# Patient Record
Sex: Male | Born: 1935
Health system: Southern US, Community
[De-identification: ages and names within clinical notes are randomized; demographics above are authoritative.]

## PROBLEM LIST (undated history)

## (undated) DIAGNOSIS — Z8719 Personal history of other diseases of the digestive system: Secondary | ICD-10-CM

## (undated) DIAGNOSIS — H9190 Unspecified hearing loss, unspecified ear: Secondary | ICD-10-CM

## (undated) DIAGNOSIS — G629 Polyneuropathy, unspecified: Secondary | ICD-10-CM

## (undated) DIAGNOSIS — Z87442 Personal history of urinary calculi: Secondary | ICD-10-CM

## (undated) DIAGNOSIS — L719 Rosacea, unspecified: Secondary | ICD-10-CM

## (undated) DIAGNOSIS — Z9889 Other specified postprocedural states: Secondary | ICD-10-CM

## (undated) DIAGNOSIS — R011 Cardiac murmur, unspecified: Secondary | ICD-10-CM

## (undated) DIAGNOSIS — R112 Nausea with vomiting, unspecified: Secondary | ICD-10-CM

## (undated) DIAGNOSIS — C801 Malignant (primary) neoplasm, unspecified: Secondary | ICD-10-CM

## (undated) DIAGNOSIS — K5792 Diverticulitis of intestine, part unspecified, without perforation or abscess without bleeding: Secondary | ICD-10-CM

## (undated) DIAGNOSIS — C8590 Non-Hodgkin lymphoma, unspecified, unspecified site: Secondary | ICD-10-CM

## (undated) DIAGNOSIS — K219 Gastro-esophageal reflux disease without esophagitis: Secondary | ICD-10-CM

## (undated) DIAGNOSIS — Z973 Presence of spectacles and contact lenses: Secondary | ICD-10-CM

## (undated) HISTORY — PX: HEMORROIDECTOMY: SUR656

## (undated) HISTORY — PX: APPENDECTOMY: SHX54

## (undated) HISTORY — DX: Non-Hodgkin lymphoma, unspecified, unspecified site: C85.90

## (undated) HISTORY — PX: COLONOSCOPY: SHX174

## (undated) HISTORY — DX: Cardiac murmur, unspecified: R01.1

## (undated) HISTORY — PX: TONSILLECTOMY: SUR1361

## (undated) HISTORY — DX: Polyneuropathy, unspecified: G62.9

---

## 2013-04-28 ENCOUNTER — Ambulatory Visit (INDEPENDENT_AMBULATORY_CARE_PROVIDER_SITE_OTHER): Payer: Medicare Other | Admitting: Surgery

## 2013-05-08 ENCOUNTER — Encounter (INDEPENDENT_AMBULATORY_CARE_PROVIDER_SITE_OTHER): Payer: Self-pay | Admitting: Surgery

## 2013-05-08 ENCOUNTER — Ambulatory Visit (INDEPENDENT_AMBULATORY_CARE_PROVIDER_SITE_OTHER): Payer: Medicare Other | Admitting: Surgery

## 2013-05-08 VITALS — BP 118/64 | HR 88 | Temp 97.6°F | Resp 14 | Ht 68.0 in | Wt 203.4 lb

## 2013-05-08 DIAGNOSIS — K4090 Unilateral inguinal hernia, without obstruction or gangrene, not specified as recurrent: Secondary | ICD-10-CM

## 2013-05-08 NOTE — Progress Notes (Signed)
Patient ID: Seth Gilbert, male   DOB: 1936/05/09, 77 y.o.   MRN: [id number]  Chief Complaint  Patient presents with  . New Evaluation    eval LIH    HPI Seth Gilbert is a 77 y.o. male.  Patient is sent at the request of Dr. Tenny Craw for left inguinal hernia. He was picked up on his recent physical. Noticed one year ago some mild left groin swelling after physical exertion. No nausea or vomiting. No significant left groin pain. Occasional constipation. HPI  Past Medical History  Diagnosis Date  . Heart murmur     Past Surgical History  Procedure Laterality Date  . Appendectomy    . Tonsillectomy      Family History  Problem Relation Age of Onset  . COPD Mother   . Heart disease Father     Social History History  Substance Use Topics  . Smoking status: Never Smoker   . Smokeless tobacco: Never Used  . Alcohol Use: Yes     Comment: occasional    Not on File  Current Outpatient Prescriptions  Medication Sig Dispense Refill  . ampicillin (PRINCIPEN) 500 MG capsule Take 500 mg by mouth daily.       . famotidine (PEPCID) 40 MG tablet Take 40 mg by mouth daily.      . Glucosamine-Chondroit-Vit C-Mn (GLUCOSAMINE 1500 COMPLEX) CAPS Take by mouth.      . Multiple Vitamins-Minerals (CENTRUM SILVER PO) Take by mouth.      . vitamin E 400 UNIT capsule Take 400 Units by mouth daily.       No current facility-administered medications for this visit.    Review of Systems Review of Systems  Constitutional: Negative for fever, chills and unexpected weight change.  HENT: Negative for hearing loss, congestion, sore throat, trouble swallowing and voice change.   Eyes: Negative for visual disturbance.  Respiratory: Negative for cough and wheezing.   Cardiovascular: Negative for chest pain, palpitations and leg swelling.  Gastrointestinal: Negative for nausea, vomiting, abdominal pain, diarrhea, constipation, blood in stool, abdominal distention, anal bleeding and rectal pain.    Genitourinary: Negative for hematuria and difficulty urinating.  Musculoskeletal: Negative for arthralgias.  Skin: Negative for rash and wound.  Neurological: Negative for seizures, syncope, weakness and headaches.  Hematological: Negative for adenopathy. Does not bruise/bleed easily.  Psychiatric/Behavioral: Negative for confusion.    Blood pressure 118/64, pulse 88, temperature 97.6 F (36.4 C), temperature source Temporal, resp. rate 14, height 5\' 8"  (1.727 m), weight 203 lb 6.4 oz (92.262 kg).  Physical Exam Physical Exam  Constitutional: He is oriented to person, place, and time. He appears well-developed and well-nourished.  HENT:  Head: Normocephalic and atraumatic.  Eyes: EOM are normal. Pupils are equal, round, and reactive to light.  Neck: Normal range of motion. Neck supple.  Cardiovascular: Normal rate and regular rhythm.   Pulmonary/Chest: Effort normal.  Abdominal: Soft. Bowel sounds are normal.    Musculoskeletal: Normal range of motion.  Neurological: He is alert and oriented to person, place, and time.  Skin: Skin is warm and dry.  Psychiatric: He has a normal mood and affect. His behavior is normal. Judgment and thought content normal.    Data Reviewed Dr Tenny Craw notes  Assessment    Left inguinal hernia minimally symptomatic    Plan    The patient would like to have this repaired next year. Return to clinic next April for surgery in May as long as he remains asymptomatic. Information  given about anal hernias and signs and symptoms to look for to incarceration and strangulation. Will call if symptoms worsen       Kaneshia Cater A. 05/08/2013, 12:07 PM

## 2013-05-08 NOTE — Patient Instructions (Signed)

## 2013-12-18 ENCOUNTER — Encounter (INDEPENDENT_AMBULATORY_CARE_PROVIDER_SITE_OTHER): Payer: Self-pay | Admitting: Surgery

## 2013-12-18 ENCOUNTER — Ambulatory Visit (INDEPENDENT_AMBULATORY_CARE_PROVIDER_SITE_OTHER): Payer: Medicare Other | Admitting: Surgery

## 2013-12-18 VITALS — BP 126/74 | HR 72 | Temp 97.5°F | Resp 14 | Ht 68.0 in | Wt 203.8 lb

## 2013-12-18 DIAGNOSIS — K4090 Unilateral inguinal hernia, without obstruction or gangrene, not specified as recurrent: Secondary | ICD-10-CM

## 2013-12-18 NOTE — Patient Instructions (Signed)
Open Hernia Repair Open hernia repair is surgery to fix a hernia. A hernia occurs when an internal organ or tissue pushes out through a weak spot in the abdominal wall muscles. Hernias commonly occur in the groin and around the navel. Most hernias tend to get worse over time. Surgery is often done to prevent the hernia from getting bigger, becoming uncomfortable, or becoming an emergency. Emergency surgery may be needed if abdominal contents get stuck in the opening (incarcerated hernia) or the blood supply gets cut off (strangulated hernia). In an open repair, a large cut (incision) is made in the abdomen to perform the surgery. LET [facility] CARE PROVIDER KNOW ABOUT:  Any allergies you have.  All medicines you are taking, including vitamins, herbs, eye drops, creams, and over-the-counter medicines.  Previous problems you or members of your family have had with the use of anesthetics.  Any blood disorders you have.  Previous surgeries you have had.  Medical conditions you have. RISKS AND COMPLICATIONS Generally, this is a safe procedure. However, as with any procedure, complications can occur. Possible complications include:  Infection.  Bleeding.  Nerve injury.  Chronic pain.  The hernia can come back.  Injury to the intestines. BEFORE THE PROCEDURE  Ask your health care provider about changing or stopping any regular medicines. Avoid taking aspirin or blood thinners as directed by your health care provider.  Do noteat or drink anything after midnight the night before surgery.  If you smoke, do not smoke for at least 2 weeks before your surgery.  Do not drink alcohol the day before your surgery.  Let your health care provider know if you develop a cold or any infection before your surgery.  Arrange for someone to drive you home after the procedure or after your hospital stay. Also arrange for someone to help you with activities during recovery. PROCEDURE   Small  monitors will be put on your body. They are used to check your heart, blood pressure, and oxygen level.   An IV access tube will be put into one of your veins. Medicine will be able to flow directly into your body through this IV tube.   You might be given a medicine to help you relax (sedative).   You will be given a medicine to make you sleep (general anesthetic). A breathing tube may be placed into your lungs during the procedure.  A cut (incision) is made over the hernia defect, and the contents are pushed back into the abdomen.  If the hernia is small, stitches may be used to bring the muscle edges back together.  Typically, a surgeon will place a mesh patch made of man-made material (synthetic) to cover the defect. The mesh is sewn to healthy muscle. This reduces the risk of the hernia coming back.  The tissue and skin over the hernia are then closed with stitches or staples.  If the hernia was large, a drain may be left in place to collect excess fluid where the hernia used to be.  Bandages (dressings) are used to cover the incision. AFTER THE PROCEDURE  You will be taken to a recovery area where your progress will be monitored.  If the hernia was small or in the groin (inguinal) region, you will likely be allowed to go home once you are awake, stable, and taking fluids well.  If the hernia was large, you may have to wait for your bowel function to return. You may need to stay in the hospital  for 2 3 days until you can eat and your pain is controlled. A drain may be left in place for 5 7 days. You will be taught how to care for the drain. Document Released: 02/06/2001 Document Revised: 06/03/2013 Document Reviewed: 03/25/2013 [employer] Patient Information 2014 [employer].  

## 2013-12-18 NOTE — Progress Notes (Signed)
Patient ID: Seth Gilbert, male   DOB: 07-Jul-1936, 78 y.o.   MRN: [id number]  Chief Complaint  Patient presents with  . Follow-up    hernia    HPI Seth Gilbert is a 78 y.o. male.  Patient is sent at the request of Dr. Harrington Challenger for left inguinal hernia. He was picked up on his recent physical. Noticed one year ago some mild left groin swelling after physical exertion. No nausea or vomiting. No significant left groin pain. Occasional constipation. Pt returns to set up surgery to repair. No new complaints.  HPI  Past Medical History  Diagnosis Date  . Heart murmur     Past Surgical History  Procedure Laterality Date  . Appendectomy    . Tonsillectomy      Family History  Problem Relation Age of Onset  . COPD Mother   . Heart disease Father     Social History History  Substance Use Topics  . Smoking status: Never Smoker   . Smokeless tobacco: Never Used  . Alcohol Use: Yes     Comment: occasional    Not on File  Current Outpatient Prescriptions  Medication Sig Dispense Refill  . ampicillin (PRINCIPEN) 500 MG capsule Take 500 mg by mouth daily.       . famotidine (PEPCID) 40 MG tablet Take 40 mg by mouth daily.      . Glucosamine-Chondroit-Vit C-Mn (GLUCOSAMINE 1500 COMPLEX) CAPS Take by mouth.      . metroNIDAZOLE (METROGEL) 0.75 % gel       . Multiple Vitamins-Minerals (CENTRUM SILVER PO) Take by mouth.      . vitamin E 400 UNIT capsule Take 400 Units by mouth daily.       No current facility-administered medications for this visit.    Review of Systems Review of Systems  Constitutional: Negative for fever, chills and unexpected weight change.  HENT: Negative for hearing loss, congestion, sore throat, trouble swallowing and voice change.   Eyes: Negative for visual disturbance.  Respiratory: Negative for cough and wheezing.   Cardiovascular: Negative for chest pain, palpitations and leg swelling.  Gastrointestinal: Negative for nausea, vomiting, abdominal pain,  diarrhea, constipation, blood in stool, abdominal distention, anal bleeding and rectal pain.  Genitourinary: Negative for hematuria and difficulty urinating.  Musculoskeletal: Negative for arthralgias.  Skin: Negative for rash and wound.  Neurological: Negative for seizures, syncope, weakness and headaches.  Hematological: Negative for adenopathy. Does not bruise/bleed easily.  Psychiatric/Behavioral: Negative for confusion.    Blood pressure 126/74, pulse 72, temperature 97.5 F (36.4 C), resp. rate 14, height 5\' 8"  (1.727 m), weight 203 lb 12.8 oz (92.443 kg).  Physical Exam Physical Exam  Constitutional: He is oriented to person, place, and time. He appears well-developed and well-nourished.  HENT:  Head: Normocephalic and atraumatic.  Eyes: EOM are normal. Pupils are equal, round, and reactive to light.  Neck: Normal range of motion. Neck supple.  Cardiovascular: Normal rate and regular rhythm.   Pulmonary/Chest: Effort normal.  Abdominal: Soft. Bowel sounds are normal.   small reducible LIH no RIH Musculoskeletal: Normal range of motion.  Neurological: He is alert and oriented to person, place, and time.  Skin: Skin is warm and dry.  Psychiatric: He has a normal mood and affect. His behavior is normal. Judgment and thought content normal.    Data Reviewed Dr Harrington Challenger notes  Assessment    Left inguinal hernia symptomatic    Plan    The patient would like to  have this repaired. The risk of hernia repair include bleeding,  Infection,   Recurrence of the hernia,  Mesh use, chronic pain,  Organ injury,  Bowel injury,  Bladder injury,   nerve injury with numbness around the incision,  Death,  and worsening of preexisting  medical problems.  The alternatives to surgery have been discussed as well..  Long term expectations of both operative and non operative treatments have been discussed.   The patient agrees to proceed.   Kanon Novosel A. Hennessey Cantrell 12/18/2013, 11:22 AM

## 2014-02-09 ENCOUNTER — Encounter (HOSPITAL_BASED_OUTPATIENT_CLINIC_OR_DEPARTMENT_OTHER)
Admission: RE | Admit: 2014-02-09 | Discharge: 2014-02-09 | Disposition: A | Discharge status: Home / Self Care | Payer: Medicare Other | Source: Ambulatory Visit | Attending: Surgery | Admitting: Surgery

## 2014-02-09 ENCOUNTER — Encounter (HOSPITAL_BASED_OUTPATIENT_CLINIC_OR_DEPARTMENT_OTHER): Payer: Self-pay | Admitting: *Deleted

## 2014-02-09 LAB — CBC WITH DIFFERENTIAL/PLATELET
Basophils Absolute: 0 10*3/uL (ref 0.0–0.1)
Basophils Relative: 0 % (ref 0–1)
EOS ABS: 0.1 10*3/uL (ref 0.0–0.7)
EOS PCT: 2 % (ref 0–5)
HEMATOCRIT: 41.1 % (ref 39.0–52.0)
HEMOGLOBIN: 14.1 g/dL (ref 13.0–17.0)
LYMPHS ABS: 2.3 10*3/uL (ref 0.7–4.0)
LYMPHS PCT: 30 % (ref 12–46)
MCH: 32.3 pg (ref 26.0–34.0)
MCHC: 34.3 g/dL (ref 30.0–36.0)
MCV: 94.3 fL (ref 78.0–100.0)
MONO ABS: 0.6 10*3/uL (ref 0.1–1.0)
MONOS PCT: 7 % (ref 3–12)
Neutro Abs: 4.8 10*3/uL (ref 1.7–7.7)
Neutrophils Relative %: 61 % (ref 43–77)
PLATELETS: 181 10*3/uL (ref 150–400)
RBC: 4.36 MIL/uL (ref 4.22–5.81)
RDW: 13.7 % (ref 11.5–15.5)
WBC: 7.8 10*3/uL (ref 4.0–10.5)

## 2014-02-09 LAB — COMPREHENSIVE METABOLIC PANEL
ALT: 15 U/L (ref 0–53)
AST: 21 U/L (ref 0–37)
Albumin: 3.7 g/dL (ref 3.5–5.2)
Alkaline Phosphatase: 66 U/L (ref 39–117)
BUN: 19 mg/dL (ref 6–23)
CALCIUM: 9.4 mg/dL (ref 8.4–10.5)
CO2: 24 meq/L (ref 19–32)
Chloride: 101 mEq/L (ref 96–112)
Creatinine, Ser: 1.01 mg/dL (ref 0.50–1.35)
GFR calc Af Amer: 82 mL/min — ABNORMAL LOW (ref >90)
GFR, EST NON AFRICAN AMERICAN: 71 mL/min — AB (ref >90)
Glucose, Bld: 115 mg/dL — ABNORMAL HIGH (ref 70–99)
Potassium: 4.4 mEq/L (ref 3.7–5.3)
SODIUM: 141 meq/L (ref 137–147)
Total Bilirubin: 0.4 mg/dL (ref 0.3–1.2)
Total Protein: 6.5 g/dL (ref 6.0–8.3)

## 2014-02-09 NOTE — Progress Notes (Signed)
To come in for [facility] labs  

## 2014-02-11 ENCOUNTER — Encounter (HOSPITAL_BASED_OUTPATIENT_CLINIC_OR_DEPARTMENT_OTHER): Payer: Self-pay | Admitting: *Deleted

## 2014-02-11 ENCOUNTER — Encounter (HOSPITAL_BASED_OUTPATIENT_CLINIC_OR_DEPARTMENT_OTHER): Payer: Medicare Other | Admitting: Anesthesiology

## 2014-02-11 ENCOUNTER — Ambulatory Visit (HOSPITAL_BASED_OUTPATIENT_CLINIC_OR_DEPARTMENT_OTHER)
Admission: RE | Admit: 2014-02-11 | Discharge: 2014-02-11 | Disposition: A | Discharge status: Home / Self Care | Payer: Medicare Other | Source: Ambulatory Visit | Attending: Surgery | Admitting: Surgery

## 2014-02-11 ENCOUNTER — Encounter (HOSPITAL_BASED_OUTPATIENT_CLINIC_OR_DEPARTMENT_OTHER)
Admission: RE | Disposition: A | Discharge status: Home / Self Care | Payer: Self-pay | Source: Ambulatory Visit | Attending: Surgery

## 2014-02-11 ENCOUNTER — Ambulatory Visit (HOSPITAL_BASED_OUTPATIENT_CLINIC_OR_DEPARTMENT_OTHER): Payer: Medicare Other | Admitting: Anesthesiology

## 2014-02-11 DIAGNOSIS — K4090 Unilateral inguinal hernia, without obstruction or gangrene, not specified as recurrent: Secondary | ICD-10-CM

## 2014-02-11 DIAGNOSIS — Z01812 Encounter for preprocedural laboratory examination: Secondary | ICD-10-CM | POA: Insufficient documentation

## 2014-02-11 DIAGNOSIS — K219 Gastro-esophageal reflux disease without esophagitis: Secondary | ICD-10-CM | POA: Insufficient documentation

## 2014-02-11 HISTORY — DX: Rosacea, unspecified: L71.9

## 2014-02-11 HISTORY — PX: INSERTION OF MESH: SHX5868

## 2014-02-11 HISTORY — DX: Gastro-esophageal reflux disease without esophagitis: K21.9

## 2014-02-11 HISTORY — PX: INGUINAL HERNIA REPAIR: SHX194

## 2014-02-11 HISTORY — DX: Presence of spectacles and contact lenses: Z97.3

## 2014-02-11 HISTORY — DX: Unspecified hearing loss, unspecified ear: H91.90

## 2014-02-11 LAB — POCT HEMOGLOBIN-HEMACUE: Hemoglobin: 15.3 g/dL (ref 13.0–17.0)

## 2014-02-11 SURGERY — REPAIR, HERNIA, INGUINAL, ADULT
Anesthesia: General

## 2014-02-11 MED ORDER — PROPOFOL 10 MG/ML IV BOLUS
INTRAVENOUS | Status: DC | PRN
  Administered 2014-02-11: 300 mg via INTRAVENOUS

## 2014-02-11 MED ORDER — MIDAZOLAM HCL 2 MG/2ML IJ SOLN
INTRAMUSCULAR | Status: AC
  Filled 2014-02-11: qty 2

## 2014-02-11 MED ORDER — ONDANSETRON 8 MG PO TBDP
ORAL_TABLET | ORAL | Status: AC
  Filled 2014-02-11: qty 1

## 2014-02-11 MED ORDER — HYDROMORPHONE HCL PF 1 MG/ML IJ SOLN
0.2500 mg | INTRAMUSCULAR | Status: DC | PRN
  Administered 2014-02-11 (×2): 0.5 mg via INTRAVENOUS

## 2014-02-11 MED ORDER — OXYCODONE HCL 5 MG/5ML PO SOLN: 5.0000 mg | Freq: Once | ORAL | Status: DC | PRN

## 2014-02-11 MED ORDER — OXYCODONE HCL 5 MG PO TABS: 5.0000 mg | ORAL_TABLET | Freq: Once | ORAL | Status: DC | PRN

## 2014-02-11 MED ORDER — BUPIVACAINE-EPINEPHRINE 0.25% -1:200000 IJ SOLN
INTRAMUSCULAR | Status: DC | PRN
  Administered 2014-02-11: 6 mL

## 2014-02-11 MED ORDER — LIDOCAINE HCL (CARDIAC) 20 MG/ML IV SOLN
INTRAVENOUS | Status: DC | PRN
  Administered 2014-02-11: 50 mg via INTRAVENOUS

## 2014-02-11 MED ORDER — MIDAZOLAM HCL 2 MG/2ML IJ SOLN
1.0000 mg | INTRAMUSCULAR | Status: DC | PRN
  Administered 2014-02-11: 2 mg via INTRAVENOUS

## 2014-02-11 MED ORDER — SODIUM CHLORIDE 0.9 % IR SOLN
Status: DC | PRN
  Administered 2014-02-11: 14:00:00

## 2014-02-11 MED ORDER — BUPIVACAINE-EPINEPHRINE (PF) 0.5% -1:200000 IJ SOLN
INTRAMUSCULAR | Status: DC | PRN
  Administered 2014-02-11: 25 mL via PERINEURAL

## 2014-02-11 MED ORDER — LACTATED RINGERS IV SOLN
INTRAVENOUS | Status: DC
  Administered 2014-02-11 (×2): via INTRAVENOUS

## 2014-02-11 MED ORDER — HYDROMORPHONE HCL PF 1 MG/ML IJ SOLN
INTRAMUSCULAR | Status: AC
  Filled 2014-02-11: qty 1

## 2014-02-11 MED ORDER — CEFAZOLIN SODIUM-DEXTROSE 2-3 GM-% IV SOLR
2.0000 g | INTRAVENOUS | Status: AC
  Administered 2014-02-11: 2 g via INTRAVENOUS

## 2014-02-11 MED ORDER — ONDANSETRON HCL 4 MG/2ML IJ SOLN: 4.0000 mg | Freq: Once | INTRAMUSCULAR | Status: DC | PRN

## 2014-02-11 MED ORDER — DEXAMETHASONE SODIUM PHOSPHATE 4 MG/ML IJ SOLN
INTRAMUSCULAR | Status: DC | PRN
  Administered 2014-02-11: 10 mg via INTRAVENOUS

## 2014-02-11 MED ORDER — CEFAZOLIN SODIUM-DEXTROSE 2-3 GM-% IV SOLR
INTRAVENOUS | Status: AC
  Filled 2014-02-11: qty 50

## 2014-02-11 MED ORDER — OXYCODONE-ACETAMINOPHEN 5-325 MG PO TABS: 1.0000 | ORAL_TABLET | ORAL | Status: DC | PRN

## 2014-02-11 MED ORDER — FENTANYL CITRATE 0.05 MG/ML IJ SOLN
INTRAMUSCULAR | Status: AC
  Filled 2014-02-11: qty 6

## 2014-02-11 MED ORDER — FENTANYL CITRATE 0.05 MG/ML IJ SOLN
INTRAMUSCULAR | Status: AC
  Filled 2014-02-11: qty 2

## 2014-02-11 MED ORDER — FENTANYL CITRATE 0.05 MG/ML IJ SOLN
INTRAMUSCULAR | Status: DC | PRN
  Administered 2014-02-11 (×3): 25 ug via INTRAVENOUS

## 2014-02-11 MED ORDER — FENTANYL CITRATE 0.05 MG/ML IJ SOLN
50.0000 ug | INTRAMUSCULAR | Status: DC | PRN
  Administered 2014-02-11: 100 ug via INTRAVENOUS

## 2014-02-11 MED ORDER — ONDANSETRON 8 MG PO TBDP
8.0000 mg | ORAL_TABLET | Freq: Once | ORAL | Status: AC
  Administered 2014-02-11: 8 mg via ORAL

## 2014-02-11 SURGICAL SUPPLY — 47 items
BLADE SURG 15 STRL LF DISP TIS (BLADE) ×2 IMPLANT
BLADE SURG 15 STRL SS (BLADE) ×1
BLADE SURG ROTATE 9660 (MISCELLANEOUS) ×3 IMPLANT
CANISTER SUCT 1200ML W/VALVE (MISCELLANEOUS) ×3 IMPLANT
CHLORAPREP W/TINT 26ML (MISCELLANEOUS) ×3 IMPLANT
COVER MAYO STAND STRL (DRAPES) ×3 IMPLANT
COVER TABLE BACK 60X90 (DRAPES) ×3 IMPLANT
DECANTER SPIKE VIAL GLASS SM (MISCELLANEOUS) IMPLANT
DERMABOND ADVANCED (GAUZE/BANDAGES/DRESSINGS) ×1
DERMABOND ADVANCED .7 DNX12 (GAUZE/BANDAGES/DRESSINGS) ×2 IMPLANT
DRAIN PENROSE 1/2X12 LTX STRL (WOUND CARE) ×3 IMPLANT
DRAPE LAPAROTOMY TRNSV 102X78 (DRAPE) ×3 IMPLANT
DRAPE UTILITY XL STRL (DRAPES) ×3 IMPLANT
ELECT COATED BLADE 2.86 ST (ELECTRODE) ×3 IMPLANT
ELECT REM PT RETURN 9FT ADLT (ELECTROSURGICAL) ×3
ELECTRODE REM PT RTRN 9FT ADLT (ELECTROSURGICAL) ×2 IMPLANT
GAUZE SPONGE 4X4 16PLY XRAY LF (GAUZE/BANDAGES/DRESSINGS) IMPLANT
GLOVE BIOGEL PI IND STRL 8 (GLOVE) ×2 IMPLANT
GLOVE BIOGEL PI INDICATOR 8 (GLOVE) ×1
GLOVE ECLIPSE 8.0 STRL XLNG CF (GLOVE) ×3 IMPLANT
GOWN STRL REUS W/ TWL LRG LVL3 (GOWN DISPOSABLE) ×4 IMPLANT
GOWN STRL REUS W/TWL LRG LVL3 (GOWN DISPOSABLE) ×2
MESH HERNIA SYS ULTRAPRO LRG (Mesh General) ×3 IMPLANT
NEEDLE HYPO 25X1 1.5 SAFETY (NEEDLE) ×3 IMPLANT
NS IRRIG 1000ML POUR BTL (IV SOLUTION) IMPLANT
PACK BASIN DAY SURGERY FS (CUSTOM PROCEDURE TRAY) ×3 IMPLANT
PENCIL BUTTON HOLSTER BLD 10FT (ELECTRODE) ×3 IMPLANT
SLEEVE SCD COMPRESS KNEE MED (MISCELLANEOUS) ×3 IMPLANT
SPONGE GAUZE 4X4 12PLY STER LF (GAUZE/BANDAGES/DRESSINGS) IMPLANT
SPONGE LAP 4X18 X RAY DECT (DISPOSABLE) ×3 IMPLANT
STAPLER VISISTAT 35W (STAPLE) IMPLANT
SUT MON AB 4-0 PC3 18 (SUTURE) IMPLANT
SUT NOVA 0 T19/GS 22DT (SUTURE) ×6 IMPLANT
SUT VIC AB 0 SH 27 (SUTURE) ×6 IMPLANT
SUT VIC AB 2-0 SH 27 (SUTURE) ×1
SUT VIC AB 2-0 SH 27XBRD (SUTURE) ×2 IMPLANT
SUT VIC AB 3-0 54X BRD REEL (SUTURE) IMPLANT
SUT VIC AB 3-0 BRD 54 (SUTURE)
SUT VICRYL 3-0 CR8 SH (SUTURE) IMPLANT
SUT VICRYL AB 2 0 TIE (SUTURE) IMPLANT
SUT VICRYL AB 2 0 TIES (SUTURE)
SYR CONTROL 10ML LL (SYRINGE) ×3 IMPLANT
TAPE HYPAFIX 4 X10 (GAUZE/BANDAGES/DRESSINGS) IMPLANT
TOWEL OR 17X24 6PK STRL BLUE (TOWEL DISPOSABLE) ×3 IMPLANT
TOWEL OR NON WOVEN STRL DISP B (DISPOSABLE) ×3 IMPLANT
TUBE CONNECTING 20X1/4 (TUBING) ×3 IMPLANT
YANKAUER SUCT BULB TIP NO VENT (SUCTIONS) IMPLANT

## 2014-02-11 NOTE — Anesthesia Postprocedure Evaluation (Signed)
  Anesthesia Post-op Note  Patient: Seth Gilbert  Procedure(s) Performed: Procedure(s): LEFT INGUINAL HERNIA REPAIR WITH MESH  (Left) INSERTION OF MESH (N/A)  Patient Location: PACU  Anesthesia Type:GA combined with regional for post-op pain  Level of Consciousness: awake, alert  and oriented  Airway and Oxygen Therapy: Patient Spontanous Breathing  Post-op Pain: mild  Post-op Assessment: Post-op Vital signs reviewed  Post-op Vital Signs: Reviewed  Last Vitals:  Filed Vitals:   02/11/14 1545  BP: 140/85  Pulse: 87  Temp:   Resp: 13    Complications: No apparent anesthesia complications

## 2014-02-11 NOTE — H&P (Signed)
Transplants    None    Demographics CHEO SELVEY 78 year old male  [place]: None [place] [address] [phone] (H)   Problem ListHospitalization ProblemNon-Hospital  Left inguinal hernia  Significant History/Details  Smoking: Never Smoker   Smokeless Tobacco: Never Used  Alcohol: Yes  3 open orders  Preferred Language: English  Dialysis HistoryNone   Currently admitted as of 6/18/2015Specialty CommentsEditShow AllReport09/07/2013:MAY RELEASE MEDICAL INFO TO AGGIE Delsanto 09/12/1939 DOS 02/11/14 [facility]-OP-LIH w/ mesh, 12/18/13, [address], [facility] 12/31/13 pt scheduled for op surgery 35/00/93 @ [facility] no precert required per Aris Everts [phone] for cpt code given call ref #[id number] (4:47p.m.) chm,skm    MedicationsHospital Medications Outpatient Medications  ceFAZolin (ANCEF) IVPB 2 g/50 mL premix  fentaNYL (SUBLIMAZE) injection 50-100 mcg  lactated ringers infusion  midazolam (VERSED) injection 1-2 mg    Preferred Labs   None   Transplant-Related Biopsies (11 years) ** None **  Patient Blood Type (50 years)   None                                 Recent Visits (Maximum of 10 visits)Date Type Provider Description  02/11/2014 Surgery CORNETT,THOMAS A., MD   12/18/2013 Office Visit CORNETT,THOMAS A., MD Left Inguinal Hernia (Primary Dx)  05/08/2013 Office Visit Erroll Luna A., MD Left Inguinal Hernia (Primary Dx)         My Last Outpatient Progress NoteStatus Last Edited Encounter Date  Signed Fri Dec 18, 2013 11:23 AM EDT 12/18/2013  Patient ID: Seth Gilbert, male   DOB: 11-Dec-1935, 78 y.o.   MRN: [id number]    Chief Complaint   Patient presents with   .  Follow-up       hernia      HPI Seth Gilbert is a 78 y.o. male.  Patient is sent at the request of Dr. Harrington Challenger for left inguinal hernia. He was picked up on his recent physical. Noticed one year ago some mild left groin swelling after physical exertion. No  nausea or vomiting. No significant left groin pain. Occasional constipation. Pt returns to set up surgery to repair. No new complaints.  HPI    Past Medical History   Diagnosis  Date   .  Heart murmur         Past Surgical History   Procedure  Laterality  Date   .  Appendectomy       .  Tonsillectomy           Family History   Problem  Relation  Age of Onset   .  COPD  Mother     .  Heart disease  Father        Social History History   Substance Use Topics   .  Smoking status:  Never Smoker    .  Smokeless tobacco:  Never Used   .  Alcohol Use:  Yes         Comment: occasional      Not on File    Current Outpatient Prescriptions   Medication  Sig  Dispense  Refill   .  ampicillin (PRINCIPEN) 500 MG capsule  Take 500 mg by mouth daily.          .  famotidine (PEPCID) 40 MG tablet  Take 40 mg by mouth daily.         .  Glucosamine-Chondroit-Vit C-Mn (GLUCOSAMINE  1500 COMPLEX) CAPS  Take by mouth.         .  metroNIDAZOLE (METROGEL) 0.75 % gel           .  Multiple Vitamins-Minerals (CENTRUM SILVER PO)  Take by mouth.         .  vitamin E 400 UNIT capsule  Take 400 Units by mouth daily.             No current facility-administered medications for this visit.      Review of Systems Review of Systems  Constitutional: Negative for fever, chills and unexpected weight change.  HENT: Negative for hearing loss, congestion, sore throat, trouble swallowing and voice change.   Eyes: Negative for visual disturbance.  Respiratory: Negative for cough and wheezing.   Cardiovascular: Negative for chest pain, palpitations and leg swelling.  Gastrointestinal: Negative for nausea, vomiting, abdominal pain, diarrhea, constipation, blood in stool, abdominal distention, anal bleeding and rectal pain.  Genitourinary: Negative for hematuria and difficulty urinating.  Musculoskeletal: Negative for arthralgias.  Skin: Negative for rash and wound.  Neurological: Negative for seizures,  syncope, weakness and headaches.  Hematological: Negative for adenopathy. Does not bruise/bleed easily.  Psychiatric/Behavioral: Negative for confusion.      Blood pressure 126/74, pulse 72, temperature 97.5 F (36.4 C), resp. rate 14, height 5\' 8"  (1.727 m), weight 203 lb 12.8 oz (92.443 kg).   Physical Exam Physical Exam  Constitutional: He is oriented to person, place, and time. He appears well-developed and well-nourished.  HENT:   Head: Normocephalic and atraumatic.  Eyes: EOM are normal. Pupils are equal, round, and reactive to light.  Neck: Normal range of motion. Neck supple.  Cardiovascular: Normal rate and regular rhythm.   Pulmonary/Chest: Effort normal.  Abdominal: Soft. Bowel sounds are normal.   small reducible LIH no RIH Musculoskeletal: Normal range of motion.  Neurological: He is alert and oriented to person, place, and time.  Skin: Skin is warm and dry.  Psychiatric: He has a normal mood and affect. His behavior is normal. Judgment and thought content normal.      Data Reviewed Dr Harrington Challenger notes   Assessment Left inguinal hernia symptomatic   Plan The patient would like to have this repaired. The risk of hernia repair include bleeding,  Infection,   Recurrence of the hernia,  Mesh use, chronic pain,  Organ injury,  Bowel injury,  Bladder injury,   nerve injury with numbness around the incision,  Death,  and worsening of preexisting  medical problems.  The alternatives to surgery have been discussed as well..  Long term expectations of both operative and non operative treatments have been discussed.   The patient agrees to proceed.     Thomas A. Cornett

## 2014-02-11 NOTE — Progress Notes (Signed)
Assisted Dr. Crews with left, ultrasound guided, transabdominal plane block. Side rails up, monitors on throughout procedure. See vital signs in flow sheet. Tolerated Procedure well. 

## 2014-02-11 NOTE — Anesthesia Preprocedure Evaluation (Signed)
Anesthesia Evaluation  Patient identified by MRN, date of birth, ID band Patient awake    Reviewed: Allergy & Precautions, H&P , NPO status , Patient's Chart, lab work & pertinent test results  Airway Mallampati: I TM Distance: >3 FB Neck ROM: Full    Dental  (+) Teeth Intact   Pulmonary  breath sounds clear to auscultation        Cardiovascular Rhythm:Regular Rate:Normal     Neuro/Psych    GI/Hepatic GERD-  Medicated and Controlled,  Endo/Other    Renal/GU      Musculoskeletal   Abdominal   Peds  Hematology   Anesthesia Other Findings   Reproductive/Obstetrics                           Anesthesia Physical Anesthesia Plan  ASA: II  Anesthesia Plan: General   Post-op Pain Management:    Induction: Intravenous  Airway Management Planned: LMA and Oral ETT  Additional Equipment:   Intra-op Plan:   Post-operative Plan: Extubation in OR  Informed Consent: I have reviewed the patients History and Physical, chart, labs and discussed the procedure including the risks, benefits and alternatives for the proposed anesthesia with the patient or authorized representative who has indicated his/her understanding and acceptance.   Dental advisory given  Plan Discussed with: CRNA, Anesthesiologist and Surgeon  Anesthesia Plan Comments:         Anesthesia Quick Evaluation

## 2014-02-11 NOTE — Transfer of Care (Signed)
Immediate Anesthesia Transfer of Care Note  Patient: Seth Gilbert  Procedure(s) Performed: Procedure(s): LEFT INGUINAL HERNIA REPAIR WITH MESH  (Left) INSERTION OF MESH (N/A)  Patient Location: PACU  Anesthesia Type:General  Level of Consciousness: awake  Airway & Oxygen Therapy: Patient Spontanous Breathing and Patient connected to face mask oxygen  Post-op Assessment: Report given to PACU RN and Post -op Vital signs reviewed and stable  Post vital signs: Reviewed and stable  Complications: No apparent anesthesia complications

## 2014-02-11 NOTE — Anesthesia Procedure Notes (Addendum)
Anesthesia Regional Block:  TAP block  Pre-Anesthetic Checklist: ,, timeout performed, Correct Patient, Correct Site, Correct Laterality, Correct Procedure, Correct Position, site marked, Risks and benefits discussed,  Surgical consent,  Pre-op evaluation,  At surgeon's request and post-op pain management  Laterality: Left and Lower  Prep: chloraprep       Needles:  Injection technique: Single-shot  Needle Type: Echogenic Needle     Needle Length: 9cm 9 cm Needle Gauge: 21 and 21 G    Additional Needles:  Procedures: ultrasound guided (picture in chart) TAP block Narrative:  Start time: 02/11/2014 11:45 AM End time: 02/11/2014 11:51 AM Injection made incrementally with aspirations every 5 mL.  Performed by: Personally  Anesthesiologist: Lorrene Reid, MD   Procedure Name: LMA Insertion Date/Time: 02/11/2014 1:04 PM Performed by: Lieutenant Diego Pre-anesthesia Checklist: Patient identified, Emergency Drugs available, Suction available and Patient being monitored Patient Re-evaluated:Patient Re-evaluated prior to inductionOxygen Delivery Method: Circle System Utilized Preoxygenation: Pre-oxygenation with 100% oxygen Intubation Type: IV induction Ventilation: Mask ventilation without difficulty LMA: LMA inserted LMA Size: 5.0 Number of attempts: 1 Airway Equipment and Method: bite block Placement Confirmation: positive ETCO2 and breath sounds checked- equal and bilateral Tube secured with: Tape Dental Injury: Teeth and Oropharynx as per pre-operative assessment

## 2014-02-11 NOTE — Interval H&P Note (Signed)
History and Physical Interval Note:  02/11/2014 12:35 PM  Seth Gilbert  has presented today for surgery, with the diagnosis of left inguinal hernia  The various methods of treatment have been discussed with the patient and family. After consideration of risks, benefits and other options for treatment, the patient has consented to  Procedure(s): LEFT INGUINAL HERNIA REPAIR WITH MESH  (Left) INSERTION OF MESH (N/A) as a surgical intervention .  The patient's history has been reviewed, patient examined, no change in status, stable for surgery.  I have reviewed the patient's chart and labs.  Questions were answered to the patient's satisfaction.     Juston Goheen A.

## 2014-02-11 NOTE — Op Note (Signed)
Left Inguinal Hernia, Open, Procedure Note with [facility]   Indications: The patient presented with a history of a left INGUINAL , reducible hernia.    Pre-operative Diagnosis: left INGUINAL  Reducible HERNIA [place]  Post-operative Diagnosis: same  Surgeon: Erroll Luna A.   Assistants: OR staff  Anesthesia: General LMA anesthesia, Local anesthesia 0.25.% bupivacaine, with epinephrine and TEP block   ASA Class: 2  Procedure Details  The patient was seen again in the Holding Room. The risks, benefits, complications, treatment options, and expected outcomes were discussed with the patient. The possibilities of reaction to medication, pulmonary aspiration, perforation of viscus, bleeding, recurrent infection, the need for additional procedures, and development of a complication requiring transfusion or further operation were discussed with the patient and/or family. There was concurrence with the proposed plan, and informed consent was obtained. The site of surgery was properly noted/marked. The patient was taken to the Operating Room, identified as Seth Gilbert, and the procedure verified as hernia repair. A Time Out was held and the above information confirmed.  The patient was placed in the supine position and underwent induction of anesthesia, the lower abdomen and groin was prepped and draped in the standard fashion, and 0.25% Marcaine with epinephrine was used to anesthetize the skin over the mid-portion of the inguinal canal. A transverse incision was made. Dissection was carried through the soft tissue to expose the inguinal canal and inguinal ligament along its lower edge. The external oblique fascia was split along the course of its fibers, exposing the inguinal canal. The cord and nerve were looped using a Penrose drain and reflected out of the field. The indirect  And direct defect were exposed and a piece of prolene hernia system ultrapro mesh was and placed over the indirect defect  after reducing the small hernia sac.The indirect defect was   well covered by the onlay portion of the mesh.   Interupted 1-0 novafil suture was then used  to repair the defect, with the suture being sewn from the pubic tubercle inferiorly and superiorly along the canal to a level just beyond the internal ring. The mesh was split to allow passage of the cord  into the canal without entrapment.  The ilioinguinal and iliohypogastric nerves were divided since these were tethered by the mesh. The contents were then returned to canal and the external oblique fashion was then closed in a continuous fashion using 3-0 Vicryl suture taking care not to cause entrapment. Scarpa's layer closed with 3 0 vicryl and 4 0 monocryl used to close the skin.  Dermabond used for dressing.  Instrument, sponge, and needle counts were correct prior to closure and at the conclusion of the case.  Findings: Hernia as above  Estimated Blood Loss: Minimal         Drains: None         Total IV Fluids: 1000 mL         Specimens: none               Complications: None; patient tolerated the procedure well.         Disposition: PACU - hemodynamically stable.         Condition: stable

## 2014-02-11 NOTE — Discharge Instructions (Signed)
[facility] _______[facility] ° °UMBILICAL OR INGUINAL HERNIA REPAIR: POST OP INSTRUCTIONS ° °Always review your discharge instruction sheet given to you by the facility where your surgery was performed. °IF YOU HAVE DISABILITY OR FAMILY LEAVE FORMS, YOU MUST BRING THEM TO THE OFFICE FOR PROCESSING.   °DO NOT GIVE THEM TO YOUR DOCTOR. ° °1. A  prescription for pain medication may be given to you upon discharge.  Take your pain medication as prescribed, if needed.  If narcotic pain medicine is not needed, then you may take acetaminophen (Tylenol) or ibuprofen (Advil) as needed. °2. Take your usually prescribed medications unless otherwise directed. °3. If you need a refill on your pain medication, please contact your pharmacy.  They will contact our office to request authorization. Prescriptions will not be filled after 5 pm or on week-ends. °4. You should follow a light diet the first 24 hours after arrival home, such as soup and crackers, etc.  Be sure to include lots of fluids daily.  Resume your normal diet the day after surgery. °5. Most patients will experience some swelling and bruising around the umbilicus or in the groin and scrotum.  Ice packs and reclining will help.  Swelling and bruising can take several days to resolve.  °6. It is common to experience some constipation if taking pain medication after surgery.  Increasing fluid intake and taking a stool softener (such as Colace) will usually help or prevent this problem from occurring.  A mild laxative (Milk of Magnesia or Miralax) should be taken according to package directions if there are no bowel movements after 48 hours. °7. Unless discharge instructions indicate otherwise, you may remove your bandages 24-48 hours after surgery, and you may shower at that time.  You may have steri-strips (small skin tapes) in place directly over the incision.  These strips should be left on the skin for 7-10 days.  If your surgeon used skin glue on the  incision, you may shower in 24 hours.  The glue will flake off over the next 2-3 weeks.  Any sutures or staples will be removed at the office during your follow-up visit. °8. ACTIVITIES:  You may resume regular (light) daily activities beginning the next day--such as daily self-care, walking, climbing stairs--gradually increasing activities as tolerated.  You may have sexual intercourse when it is comfortable.  Refrain from any heavy lifting or straining until approved by your doctor. °a. You may drive when you are no longer taking prescription pain medication, you can comfortably wear a seatbelt, and you can safely maneuver your car and apply brakes. °b. RETURN TO WORK:  __________________________________________________________ °9. You should see your doctor in the office for a follow-up appointment approximately 2-3 weeks after your surgery.  Make sure that you call for this appointment within a day or two after you arrive home to insure a convenient appointment time. °10. OTHER INSTRUCTIONS:  __________________________________________________________________________________________________________________________________________________________________________________________  °WHEN TO CALL YOUR DOCTOR: °1. Fever over 101.0 °2. Inability to urinate °3. Nausea and/or vomiting °4. Extreme swelling or bruising °5. Continued bleeding from incision. °6. Increased pain, redness, or drainage from the incision ° °The clinic staff is available to answer your questions during regular business hours.  Please don’t hesitate to call and ask to speak to one of the nurses for clinical concerns.  If you have a medical emergency, go to the nearest emergency room or call 911.  A surgeon from [facility] is always on call at the hospital ° ° °  [address] [place], [place], [place]  [address] ?  [address], [place], [place]   [address] [phone] ? [phone] ? FAX [phone] Web site:  [email or web address]    Post Anesthesia Home Care Instructions  Activity: Get plenty of rest for the remainder of the day. A responsible adult should stay with you for 24 hours following the procedure.  For the next 24 hours, DO NOT: -Drive a car -Paediatric nurse -Drink alcoholic beverages -Take any medication unless instructed by your physician -Make any legal decisions or sign important papers.  Meals: Start with liquid foods such as gelatin or soup. Progress to regular foods as tolerated. Avoid greasy, spicy, heavy foods. If nausea and/or vomiting occur, drink only clear liquids until the nausea and/or vomiting subsides. Call your physician if vomiting continues.  Special Instructions/Symptoms: Your throat may feel dry or sore from the anesthesia or the breathing tube placed in your throat during surgery. If this causes discomfort, gargle with warm salt water. The discomfort should disappear within 24 hours. [place] Office Phone Number [phone]  BREAST BIOPSY/ PARTIAL MASTECTOMY: POST OP INSTRUCTIONS  Always review your discharge instruction sheet given to you by the facility where your surgery was performed.  IF YOU HAVE DISABILITY OR FAMILY LEAVE FORMS, YOU MUST BRING THEM TO THE OFFICE FOR PROCESSING.  DO NOT GIVE THEM TO YOUR DOCTOR.  11. A prescription for pain medication may be given to you upon discharge.  Take your pain medication as prescribed, if needed.  If narcotic pain medicine is not needed, then you may take acetaminophen (Tylenol) or ibuprofen (Advil) as needed. 12. Take your usually prescribed medications unless otherwise directed 13. If you need a refill on your pain medication, please contact your pharmacy.  They will contact our office to request authorization.  Prescriptions will not be filled after 5pm or on week-ends. 14. You should eat very light the first 24 hours after surgery, such as soup, crackers, pudding, etc.   Resume your normal diet the day after surgery. 15. Most patients will experience some swelling and bruising in the breast.  Ice packs and a good support bra will help.  Swelling and bruising can take several days to resolve.  16. It is common to experience some constipation if taking pain medication after surgery.  Increasing fluid intake and taking a stool softener will usually help or prevent this problem from occurring.  A mild laxative (Milk of Magnesia or Miralax) should be taken according to package directions if there are no bowel movements after 48 hours. 17. Unless discharge instructions indicate otherwise, you may remove your bandages 24-48 hours after surgery, and you may shower at that time.  You may have steri-strips (small skin tapes) in place directly over the incision.  These strips should be left on the skin for 7-10 days.  If your surgeon used skin glue on the incision, you may shower in 24 hours.  The glue will flake off over the next 2-3 weeks.  Any sutures or staples will be removed at the office during your follow-up visit. 18. ACTIVITIES:  You may resume regular daily activities (gradually increasing) beginning the next day.  Wearing a good support bra or sports bra minimizes pain and swelling.  You may have sexual intercourse when it is comfortable. a. You may drive when you no longer are taking prescription pain medication, you can comfortably wear a seatbelt, and you can safely maneuver your car and apply brakes. b. RETURN  TO WORK:  ______________________________________________________________________________________ 19. You should see your doctor in the office for a follow-up appointment approximately two weeks after your surgery.  Your doctors nurse will typically make your follow-up appointment when she calls you with your pathology report.  Expect your pathology report 2-3 business days after your surgery.  You may call to check if you do not hear from [place] after three  days. 20. OTHER INSTRUCTIONS: _______________________________________________________________________________________________ _____________________________________________________________________________________________________________________________________ _____________________________________________________________________________________________________________________________________ _____________________________________________________________________________________________________________________________________  WHEN TO CALL YOUR DOCTOR: 7. Fever over 101.0 8. Nausea and/or vomiting. 9. Extreme swelling or bruising. 10. Continued bleeding from incision. 11. Increased pain, redness, or drainage from the incision.  The clinic staff is available to answer your questions during regular business hours.  Please dont hesitate to call and ask to speak to one of the nurses for clinical concerns.  If you have a medical emergency, go to the nearest emergency room or call 911.  A surgeon from [facility] is always on call at the hospital.  For further questions, please visit [email or web address]

## 2014-02-12 ENCOUNTER — Encounter (HOSPITAL_BASED_OUTPATIENT_CLINIC_OR_DEPARTMENT_OTHER): Payer: Self-pay | Admitting: Surgery

## 2014-03-08 ENCOUNTER — Encounter (INDEPENDENT_AMBULATORY_CARE_PROVIDER_SITE_OTHER): Payer: Self-pay | Admitting: Surgery

## 2014-03-08 ENCOUNTER — Ambulatory Visit (INDEPENDENT_AMBULATORY_CARE_PROVIDER_SITE_OTHER): Payer: Medicare Other | Admitting: Surgery

## 2014-03-08 VITALS — BP 136/78 | HR 68 | Resp 16 | Ht 68.0 in | Wt 203.8 lb

## 2014-03-08 DIAGNOSIS — Z9889 Other specified postprocedural states: Secondary | ICD-10-CM | POA: Insufficient documentation

## 2014-03-08 NOTE — Patient Instructions (Signed)
Resume full activity in 1 week.

## 2014-03-08 NOTE — Progress Notes (Signed)
Pt returns today after left inguinal  hernia repair.  Pain is well controlled.  Bowels are functioning.  Wound is clean.  On exam:  Incision is clean /dry/intact.  Area is soft without signs of hernia recurrence.  Impression:  Status repair of hernia  Plan:  RTC PRN  Return to work in  1  Week. Increase activity slowly.

## 2015-10-21 DIAGNOSIS — D1800 Hemangioma unspecified site: Secondary | ICD-10-CM | POA: Diagnosis not present

## 2015-10-21 DIAGNOSIS — L719 Rosacea, unspecified: Secondary | ICD-10-CM | POA: Diagnosis not present

## 2015-10-21 DIAGNOSIS — L738 Other specified follicular disorders: Secondary | ICD-10-CM | POA: Diagnosis not present

## 2015-10-21 DIAGNOSIS — L821 Other seborrheic keratosis: Secondary | ICD-10-CM | POA: Diagnosis not present

## 2015-10-21 DIAGNOSIS — Z23 Encounter for immunization: Secondary | ICD-10-CM | POA: Diagnosis not present

## 2016-03-06 DIAGNOSIS — R109 Unspecified abdominal pain: Secondary | ICD-10-CM | POA: Diagnosis not present

## 2016-03-08 DIAGNOSIS — R0781 Pleurodynia: Secondary | ICD-10-CM | POA: Diagnosis not present

## 2016-03-09 ENCOUNTER — Other Ambulatory Visit: Payer: Self-pay | Admitting: Family Medicine

## 2016-03-09 DIAGNOSIS — R9389 Abnormal findings on diagnostic imaging of other specified body structures: Secondary | ICD-10-CM

## 2016-03-12 ENCOUNTER — Ambulatory Visit
Admission: RE | Admit: 2016-03-12 | Discharge: 2016-03-12 | Disposition: A | Discharge status: Home / Self Care | Payer: Self-pay | Source: Ambulatory Visit | Attending: Family Medicine | Admitting: Family Medicine

## 2016-03-12 DIAGNOSIS — R9389 Abnormal findings on diagnostic imaging of other specified body structures: Secondary | ICD-10-CM

## 2016-03-12 DIAGNOSIS — R918 Other nonspecific abnormal finding of lung field: Secondary | ICD-10-CM | POA: Diagnosis not present

## 2016-03-13 ENCOUNTER — Telehealth: Payer: Self-pay | Admitting: *Deleted

## 2016-03-13 ENCOUNTER — Other Ambulatory Visit (HOSPITAL_COMMUNITY): Payer: Self-pay | Admitting: Family Medicine

## 2016-03-13 ENCOUNTER — Encounter: Payer: Self-pay | Admitting: *Deleted

## 2016-03-13 DIAGNOSIS — R918 Other nonspecific abnormal finding of lung field: Secondary | ICD-10-CM | POA: Insufficient documentation

## 2016-03-13 NOTE — Telephone Encounter (Signed)
Oncology Nurse Navigator Documentation  Oncology Nurse Navigator Flowsheets 03/13/2016  Navigator Encounter Type Telephone  Telephone Outgoing Call  Treatment Phase Pre-Tx/Tx Discussion  Barriers/Navigation Needs Coordination of Care  Interventions Coordination of Care  Coordination of Care Appts  Acuity Level 2  Acuity Level 2 Assistance expediting appointments  Time Spent with Patient 89   I received referral today on Seth Gilbert.  I called referring office to see if PET scan has been approved. It has with authorization number of [id number].  I called central scheduling to schedule.  I received an appt.  I tried to call patient on cell and home phone but was unable to reach.  I did leave a vm message on his phone to call with my name and phone number.

## 2016-03-13 NOTE — Telephone Encounter (Signed)
Oncology Nurse Navigator Documentation  Oncology Nurse Navigator Flowsheets 03/13/2016  Navigator Encounter Type Telephone  Telephone Outgoing Call  Treatment Phase Pre-Tx/Tx Discussion  Barriers/Navigation Needs Coordination of Care  Interventions Coordination of Care  Coordination of Care Appts  Acuity Level 1  Time Spent with Patient 15   I received 2 calls from referring Dr office stating Seth Gilbert's land line is not working.  I called his cell.  I updated him on his PET with pre procedure instructions and his appt for [place] on 03/22/16.  He verbalized understanding of appts time and place.

## 2016-03-19 ENCOUNTER — Telehealth: Payer: Self-pay | Admitting: *Deleted

## 2016-03-19 NOTE — Telephone Encounter (Signed)
Oncology Nurse Navigator Documentation  Oncology Nurse Navigator Flowsheets 03/19/2016  Navigator Encounter Type Telephone  Telephone Outgoing Call  Treatment Phase Pre-Tx/Tx Discussion  Barriers/Navigation Needs Coordination of Care  Interventions Coordination of Care  Coordination of Care Appts  Acuity Level 1  Time Spent with Patient 30   I received a call from Dr. Everrett Coombe office.  He would like to see the patient tomorrow after his PET scan.  I called patient and updated him on appt time and place.  He stated he would be there after his PET scan.  I called [facility] back to update.

## 2016-03-20 ENCOUNTER — Ambulatory Visit (HOSPITAL_COMMUNITY)
Admission: RE | Admit: 2016-03-20 | Discharge: 2016-03-20 | Disposition: A | Discharge status: Home / Self Care | Payer: Medicare Other | Source: Ambulatory Visit | Attending: Cardiothoracic Surgery | Admitting: Cardiothoracic Surgery

## 2016-03-20 ENCOUNTER — Encounter: Payer: Self-pay | Admitting: Cardiothoracic Surgery

## 2016-03-20 ENCOUNTER — Other Ambulatory Visit: Payer: Self-pay | Admitting: *Deleted

## 2016-03-20 ENCOUNTER — Institutional Professional Consult (permissible substitution) (INDEPENDENT_AMBULATORY_CARE_PROVIDER_SITE_OTHER): Payer: Medicare Other | Admitting: Cardiothoracic Surgery

## 2016-03-20 VITALS — BP 135/75 | HR 86 | Resp 16 | Ht 68.0 in | Wt 200.0 lb

## 2016-03-20 DIAGNOSIS — C772 Secondary and unspecified malignant neoplasm of intra-abdominal lymph nodes: Secondary | ICD-10-CM | POA: Diagnosis not present

## 2016-03-20 DIAGNOSIS — R918 Other nonspecific abnormal finding of lung field: Secondary | ICD-10-CM | POA: Diagnosis not present

## 2016-03-20 DIAGNOSIS — K5730 Diverticulosis of large intestine without perforation or abscess without bleeding: Secondary | ICD-10-CM | POA: Insufficient documentation

## 2016-03-20 DIAGNOSIS — R911 Solitary pulmonary nodule: Secondary | ICD-10-CM

## 2016-03-20 DIAGNOSIS — I2510 Atherosclerotic heart disease of native coronary artery without angina pectoris: Secondary | ICD-10-CM | POA: Diagnosis not present

## 2016-03-20 DIAGNOSIS — I700 Atherosclerosis of aorta: Secondary | ICD-10-CM | POA: Diagnosis not present

## 2016-03-20 DIAGNOSIS — K449 Diaphragmatic hernia without obstruction or gangrene: Secondary | ICD-10-CM | POA: Insufficient documentation

## 2016-03-20 LAB — GLUCOSE, CAPILLARY: Glucose-Capillary: 105 mg/dL — ABNORMAL HIGH (ref 65–99)

## 2016-03-20 MED ORDER — FLUDEOXYGLUCOSE F - 18 (FDG) INJECTION
9.9900 | Freq: Once | INTRAVENOUS | Status: AC | PRN
  Administered 2016-03-20: 9.99 via INTRAVENOUS

## 2016-03-20 NOTE — Progress Notes (Signed)
[address]       [place] [address]             [phone]                    [facility] Medical Record [id number] Date of Birth: 04-08-1936  Referring: [name], MD Primary Care:  [name], MD  Chief Complaint:    Chief Complaint  Patient presents with  . Lung Mass    LLLobe...CT CHEST 03/12/16, PET 03/20/16  . Lung Lesion    LLobe    History of Present Illness:    Seth Gilbert 80 y.o. male is seen in the office  today for Abnormal CT of the chest . The patient noted several weeks ago after doing yard work some left sided epigastric and left chest pain sometimes bothering during the night and Ritalin would be relieved by getting up. Because of the atypical pain sought medical attention chest x-ray was abnormal leading to a CT scan of the chest and a PET scan. The patient has no previous history of cardiac disease.  The patient denies fever chills or night sweats, his weight has been stable, he denies abdominal pain.  Patient has been a lifelong nonsmoker    Current Activity/ Functional Status:  Patient is independent with mobility/ambulation, transfers, ADL's, IADL's.   Zubrod Score: At the time of surgery this patient's most appropriate activity status/level should be described as: [x]     0    Normal activity, no symptoms []     1    Restricted in physical strenuous activity but ambulatory, able to do out light work []     2    Ambulatory and capable of self care, unable to do work activities, up and about               >50 % of waking hours                              []     3    Only limited self care, in bed greater than 50% of waking hours []     4    Completely disabled, no self care, confined to bed or chair []     5    Moribund   Past Medical History:  Diagnosis Date  . GERD (gastroesophageal reflux disease)   . Heart murmur   . HOH (hard of hearing)   . Rosacea   . Wears glasses     Past Surgical History:    Procedure Laterality Date  . APPENDECTOMY    . COLONOSCOPY    . HEMORROIDECTOMY    . INGUINAL HERNIA REPAIR Left 02/11/2014   Procedure: LEFT INGUINAL HERNIA REPAIR WITH MESH ;  Surgeon: Joyice Faster. Cornett, MD;  Location: [facility];  Service: General;  Laterality: Left;  . INSERTION OF MESH N/A 02/11/2014   Procedure: INSERTION OF MESH;  Surgeon: Joyice Faster. Cornett, MD;  Location: [facility];  Service: General;  Laterality: N/A;  . TONSILLECTOMY      Family History  Problem Relation Age of Onset  . COPD Mother   . Heart disease Father     Social History   Social History  . Marital status: Married    Spouse name: N/A  . Number of children: N/A  . Years of education: N/A   Occupational History  .  Not on file.   Social History Main Topics  . Smoking status: Never Smoker  . Smokeless tobacco: Never Used  . Alcohol use Yes     Comment: occasional  . Drug use: No  . Sexual activity: Not on file   Other Topics Concern  . Not on file   Social History Narrative  . No narrative on file    History  Smoking Status  . Never Smoker  Smokeless Tobacco  . Never Used    History  Alcohol Use  . Yes    Comment: occasional     No Known Allergies  Current Outpatient Prescriptions  Medication Sig Dispense Refill  . ampicillin (PRINCIPEN) 500 MG capsule Take 500 mg by mouth daily.     . famotidine (PEPCID) 40 MG tablet Take 40 mg by mouth daily.    . Glucosamine-Chondroit-Vit C-Mn (GLUCOSAMINE 1500 COMPLEX) CAPS Take by mouth.    . metroNIDAZOLE (METROGEL) 0.75 % gel     . Multiple Vitamins-Minerals (CENTRUM SILVER PO) Take by mouth.    . vitamin E 400 UNIT capsule Take 400 Units by mouth daily.     No current facility-administered medications for this visit.       Review of Systems:     Cardiac Review of Systems: Y or N  Chest Pain [ y   ]  Resting SOB [  n ] Exertional SOB  [ n ]  Orthopnea [n  ]   Pedal Edema [ n  ]    Palpitations  [n  ] Syncope  [n  ]   Presyncope [  n ]  General Review of Systems: [Y] = yes [  ]=no Constitional: recent weight change [n  ];  Wt loss over the last 3 months [   ] anorexia [  ]; fatigue [  ]; nausea [  ]; night sweats [  ]; fever [n  ]; or chills [  ];          Dental: poor dentition[  ]; Last Dentist visit:   Eye : blurred vision [  ]; diplopia [   ]; vision changes [  ];  Amaurosis fugax[  ]; Resp: cough [  ];  wheezing[  ];  hemoptysis[  ]; shortness of breath[  ]; paroxysmal nocturnal dyspnea[  ]; dyspnea on exertion[  ]; or orthopnea[  ];  GI:  gallstones[  ], vomiting[ n ];  dysphagia[  ]; melena[ n ];  hematochezia [ n ]; heartburn[n  ];   Hx of  Colonoscopy[  ]; GU: kidney stones [  ]; hematuria[ n  ];   dysuria [  ];  nocturia[  ];  history of     obstruction [  ]; urinary frequency [  ]             Skin: rash, swelling[  ];, hair loss[  ];  peripheral edema[  ];  or itching[  ]; Musculosketetal: myalgias[  ];  joint swelling[  ];  joint erythema[  ];  joint pain[  ];  back pain[  ];  Heme/Lymph: bruising[  ];  bleeding[  ];  anemia[  ];  Neuro: TIA[n  ];  headaches[  ];  stroke[  ];  vertigo[  ];  seizures[  ];   paresthesias[  ];  difficulty walking[  ];  Psych:depression[  ]; anxiety[  ];  Endocrine: diabetes[  ];  thyroid dysfunction[  ];  Immunizations: Flu up to date [ y ];  Pneumococcal up to date [ y ];  Other:  Physical Exam: BP 135/75   Pulse 86   Resp 16   Ht 5\' 8"  (1.727 m)   Wt 200 lb (90.7 kg)   SpO2 98% Comment: ON RA  BMI 30.41 kg/m   PHYSICAL EXAMINATION: General appearance: alert, cooperative and no distress Head: Normocephalic, without obvious abnormality, atraumatic Neck: no adenopathy, no carotid bruit, no JVD, supple, symmetrical, trachea midline and thyroid not enlarged, symmetric, no tenderness/mass/nodules Lymph nodes: Cervical, supraclavicular, and axillary nodes normal. Resp: clear to auscultation bilaterally Back: symmetric, no curvature. ROM  normal. No CVA tenderness. Cardio: regular rate and rhythm, S1, S2 normal, no murmur, click, rub or gallop GI: soft, non-tender; bowel sounds normal; no masses,  no organomegaly Extremities: extremities normal, atraumatic, no cyanosis or edema and Homans sign is negative, no sign of DVT Neurologic: Grossly normal  Diagnostic Studies & Laboratory data:     Recent Radiology Findings:   Ct Chest Wo Contrast  Result Date: 03/12/2016 CLINICAL DATA:  Abnormal chest x-ray in left lower lobe. EXAM: CT CHEST WITHOUT CONTRAST TECHNIQUE: Multidetector CT imaging of the chest was performed following the standard protocol without IV contrast. COMPARISON:  Radiograph of March 08, 2016. FINDINGS: Cardiovascular: Coronary artery calcifications are noted. Cardiac size is within normal limits. Mediastinum/Nodes: Mediastinal adenopathy is noted with subcarinal adenopathy measuring 4.5 x 2.1 cm. Precarinal nodule measuring 17 x 13 mm is noted. Right peritracheal lymph node measuring 13 x 9 mm is noted. 28 x 8 mm note is noted in aortopulmonary window. Lungs/Pleura: 2.5 x 2.4 cm mass is seen in left lower lobe inferior to left hilum. 9 mm nodule is noted in superior segment of left lower lobe. Multiple irregular densities are noted inferiorly in lingular segment, with the largest measuring 18 x 14 mm. Multiple other nodules are noted in the left lower lobe. Upper Abdomen: Moderate size hiatal hernia is noted. Nonobstructive right renal calculus is noted. Musculoskeletal: Multilevel degenerative changes are noted in the thoracic spine. IMPRESSION: 2.5 cm mass is seen medially in left lower lobe inferior to hilum. Mediastinal adenopathy is noted concerning for metastatic disease. Multiple nodules are noted in the left upper and lower lobes concerning for metastatic disease. PET scan is recommended for further evaluation. These results will be called to the ordering clinician or representative by the Radiologist Assistant, and  communication documented in the PACS or zVision Dashboard. Coronary artery calcifications are noted suggesting Coronary artery disease. Moderate size hiatal hernia is noted. Nonobstructive right renal calculus. Electronically Signed   By: Marijo Conception, M.D.   On: 03/12/2016 13:56   Nm Pet Image Initial (pi) Skull Base To Thigh  Result Date: 03/20/2016 CLINICAL DATA:  Initial treatment strategy for lung lesion. EXAM: NUCLEAR MEDICINE PET SKULL BASE TO THIGH TECHNIQUE: 10.0 mCi F-18 FDG was injected intravenously. Full-ring PET imaging was performed from the skull base to thigh after the radiotracer. CT data was obtained and used for attenuation correction and anatomic localization. FASTING BLOOD GLUCOSE:  Value: 105 mg/dl COMPARISON:  Chest CT 03/12/2016. FINDINGS: NECK No hypermetabolic lymph nodes in the neck. CHEST Previously noted large pulmonary nodule in the medial aspect of the left lower lobe measures 2.9 x 2.6 cm and is hypermetabolic (SUVmax = 99991111). Several other smaller nodular densities also noted throughout the left lower lobe and lingula are also hypermetabolic. There are numerous extrapleural nodular densities throughout the left hemithorax, presumably extrapleural lymph nodes. These are diffusely hypermetabolic. Specific examples  include a hypermetabolic (SUVmax = 0000000) 1 cm short axis lymph node (image 91 of series 4) in the posteromedial aspect of the left hemithorax (image 91 of series 4). There are numerous hypermetabolic mediastinal lymph nodes, the largest cluster of which is centered around the mid thoracic esophagus. Although these are difficult to discretely measure they are markedly hypermetabolic (SUVmax = 123456). Large 18 mm short axis juxta diaphragmatic lymph node noted in the lower middle mediastinum (image 86 of series 4) is hypermetabolic (SUVmax = 99991111). Superior mediastinal lymph nodes measuring up to 11 mm in short axis immediately anterior to the left common carotid artery  origin (image 56 of series 4) are hypermetabolic (SUVmax = 8.8). Right supraclavicular and subpectoral lymphadenopathy is also noted. Nonenlarged but hypermetabolic left internal mammary lymph nodes are also noted (SUVmax = 8.0). There is aortic atherosclerosis, as well as atherosclerosis of the great vessels of the mediastinum and the coronary arteries, including calcified atherosclerotic plaque in the left main, left anterior descending, left circumflex and right coronary arteries. Calcifications of the aortic valve. Moderate-sized hiatal hernia. Multiple small hypermetabolic retrocrural lymph nodes. ABDOMEN/PELVIS No abnormal hypermetabolic activity within the liver, pancreas, adrenal glands, or spleen. Extensive retroperitoneal hypermetabolic lymphadenopathy, largest of which is a left periaortic node measuring 1.7 cm in short axis (image 126 of series 4) which is hypermetabolic (SUVmax = [id number]). 4 mm nonobstructive calculus in the upper pole collecting system of the right kidney. No ureteral stones or findings of urinary tract obstruction are noted at this time. No pathologic dilatation of small bowel or colon. Numerous colonic diverticulae are noted, without definite surrounding inflammatory changes to suggest an acute diverticulitis at this time. There is a focal area of colonic wall thickening involving the mid sigmoid colon (image 160 of series 4) which demonstrates some low-level hypermetabolic activity (SUVmax = 6.2). SKELETON No focal hypermetabolic activity to suggest skeletal metastasis. IMPRESSION: 1. Previously noted pulmonary nodules are hypermetabolic, with the largest of these in the medial left lower lobe measuring 2.9 x 2.6 cm. This largest nodule in particular is suspicious for a primary bronchogenic neoplasm. The other smaller nodules could be infectious/inflammatory related to postobstructive changes, or could represent metastatic lesions. At this time, there is widespread metastatic  lymphadenopathy throughout the mediastinum, extrapleural lymph nodes throughout the left hemithorax, right supraclavicular region, left internal mammary lymph nodes, retrocrural lymph nodes, and retroperitoneal lymph nodes, as detailed above. 2. Colonic diverticulosis. In the mid sigmoid colon there is a focal area of mural thickening which demonstrates some low-level hypermetabolic activity. This could be related to chronic inflammatory changes from underlying diverticular disease, however, correlation with nonemergent colonoscopy is recommended in the near future to exclude a primary colonic neoplasm. 3. Aortic atherosclerosis, in addition to left main and 3 vessel coronary artery disease. Please note that although the presence of coronary artery calcium documents the presence of coronary artery disease, the severity of this disease and any potential stenosis cannot be assessed on this non-gated CT examination. Assessment for potential risk factor modification, dietary therapy or pharmacologic therapy may be warranted, if clinically indicated. 4. There are calcifications of the aortic valve. Echocardiographic correlation for evaluation of potential valvular dysfunction may be warranted if clinically indicated. 5. Moderate-sized hiatal hernia. 6. Additional incidental findings, as above. Electronically Signed   By: Vinnie Langton M.D.   On: 03/20/2016 15:35    I have independently reviewed the above radiologic studies.  Recent Lab Findings: Lab Results  Component Value Date   WBC  7.8 02/09/2014   HGB 15.3 02/11/2014   HCT 41.1 02/09/2014   PLT 181 02/09/2014   GLUCOSE 115 (H) 02/09/2014   ALT 15 02/09/2014   AST 21 02/09/2014   NA 141 02/09/2014   K 4.4 02/09/2014   CL 101 02/09/2014   CREATININE 1.01 02/09/2014   BUN 19 02/09/2014   CO2 24 02/09/2014      Assessment / Plan:    1/ 2.5 cm mass is seen medially in left lower lobe inferior to hilum. Mediastinal adenopathy is noted  concerning for metastatic disease. Multiple nodules are noted in the left upper and lower lobes concerning for metastatic disease.- Potentially could represent stage IV lung cancer, or alternatively lymphoma involving the abdomen and chest. Discuss the findings and possible diagnoses with the patient in detail and reviewed the scans with him. Options of bronchoscopy with ebus versus needle biopsy of one of the peripherally based hypermetabolic left lung lesions specifically the lesion on image 86 posteriorly on the left were discussed with the patient. This point we will proceed with CT-guided needle biopsy of peripherally based left lung lesions if we do not get a diagnosis or technically unable to do the biopsy will proceed with more invasive testing including general anesthesia bronchoscopy ebus and possible mediastinoscopy.    2/Colonic diverticulosis 3/Aortic atherosclerosis, in addition to left main and 3 vessel coronary artery clacification 4/Moderate-sized hiatal hernia . I  spent 40 minutes counseling the patient face to face and 50% or more the  time was spent in counseling and coordination of care. The total time spent in the appointment was 60 minutes.  Grace Isaac MD      [address] [place] [address] Office [phone]   [facility] [phone]  03/20/2016 4:04 PM

## 2016-03-26 ENCOUNTER — Other Ambulatory Visit: Payer: Self-pay | Admitting: Radiology

## 2016-03-27 ENCOUNTER — Encounter (HOSPITAL_COMMUNITY): Payer: Self-pay

## 2016-03-27 ENCOUNTER — Ambulatory Visit (HOSPITAL_COMMUNITY)
Admission: RE | Admit: 2016-03-27 | Discharge: 2016-03-27 | Disposition: A | Discharge status: Home / Self Care | Payer: Medicare Other | Source: Ambulatory Visit | Attending: Cardiothoracic Surgery | Admitting: Cardiothoracic Surgery

## 2016-03-27 DIAGNOSIS — J984 Other disorders of lung: Secondary | ICD-10-CM | POA: Diagnosis not present

## 2016-03-27 DIAGNOSIS — K219 Gastro-esophageal reflux disease without esophagitis: Secondary | ICD-10-CM | POA: Insufficient documentation

## 2016-03-27 DIAGNOSIS — Z79899 Other long term (current) drug therapy: Secondary | ICD-10-CM | POA: Insufficient documentation

## 2016-03-27 DIAGNOSIS — R591 Generalized enlarged lymph nodes: Secondary | ICD-10-CM | POA: Diagnosis not present

## 2016-03-27 DIAGNOSIS — R911 Solitary pulmonary nodule: Secondary | ICD-10-CM

## 2016-03-27 DIAGNOSIS — R590 Localized enlarged lymph nodes: Secondary | ICD-10-CM | POA: Diagnosis not present

## 2016-03-27 LAB — CBC
HCT: 44.4 % (ref 39.0–52.0)
Hemoglobin: 14.6 g/dL (ref 13.0–17.0)
MCH: 32.2 pg (ref 26.0–34.0)
MCHC: 32.9 g/dL (ref 30.0–36.0)
MCV: 98 fL (ref 78.0–100.0)
PLATELETS: 189 10*3/uL (ref 150–400)
RBC: 4.53 MIL/uL (ref 4.22–5.81)
RDW: 13.6 % (ref 11.5–15.5)
WBC: 6.4 10*3/uL (ref 4.0–10.5)

## 2016-03-27 LAB — APTT: APTT: 30 s (ref 24–36)

## 2016-03-27 LAB — PROTIME-INR
INR: 1.32
Prothrombin Time: 16.5 seconds — ABNORMAL HIGH (ref 11.4–15.2)

## 2016-03-27 MED ORDER — FENTANYL CITRATE (PF) 100 MCG/2ML IJ SOLN
INTRAMUSCULAR | Status: AC
  Filled 2016-03-27: qty 2

## 2016-03-27 MED ORDER — MIDAZOLAM HCL 2 MG/2ML IJ SOLN
INTRAMUSCULAR | Status: AC | PRN
  Administered 2016-03-27 (×2): 0.5 mg via INTRAVENOUS
  Administered 2016-03-27: 1 mg via INTRAVENOUS

## 2016-03-27 MED ORDER — MIDAZOLAM HCL 2 MG/2ML IJ SOLN
INTRAMUSCULAR | Status: AC
  Filled 2016-03-27: qty 2

## 2016-03-27 MED ORDER — FENTANYL CITRATE (PF) 100 MCG/2ML IJ SOLN
INTRAMUSCULAR | Status: AC | PRN
  Administered 2016-03-27 (×3): 25 ug via INTRAVENOUS

## 2016-03-27 MED ORDER — SODIUM CHLORIDE 0.9 % IV SOLN
INTRAVENOUS | Status: AC | PRN
  Administered 2016-03-27: 10 mL/h via INTRAVENOUS

## 2016-03-27 MED ORDER — LIDOCAINE HCL 1 % IJ SOLN
INTRAMUSCULAR | Status: AC
  Filled 2016-03-27: qty 20

## 2016-03-27 MED ORDER — SODIUM CHLORIDE 0.9 % IV SOLN
Freq: Once | INTRAVENOUS | Status: AC
  Administered 2016-03-27: 12:00:00 via INTRAVENOUS

## 2016-03-27 NOTE — Procedures (Signed)
Interventional Radiology Procedure Note  Procedure: US guided biopsy of right supraclavicular lymph node, FDG avid, concerning for malignancy.    Complications: None Recommendations:  - Ok to shower tomorrow - Do not submerge for 7 days - Routine wound care  - 1 hour observation  Signed,  Dulcy Fanny. Earleen Newport, DO

## 2016-03-27 NOTE — H&P (Signed)
Chief Complaint: Patient was seen in consultation today for right supraclavicular lymph node biopsy at the request of Gerhardt,Edward B  Referring Physician(s): Grace Isaac  Supervising Physician: Corrie Mckusick  Patient Status: Outpatient  History of Present Illness: Seth Gilbert is a 80 y.o. male   Developed left sided back and abd pain after days of yard work Work up included CXR and CT CT 03/12/16: IMPRESSION: 2.5 cm mass is seen medially in left lower lobe inferior to hilum. Mediastinal adenopathy is noted concerning for metastatic disease. Multiple nodules are noted in the left upper and lower lobes concerning for metastatic disease. PET scan is recommended for further evaluation.  PET 03/20/16: 1. Previously noted pulmonary nodules are hypermetabolic, with the largest of these in the medial left lower lobe measuring 2.9 x 2.6 cm. This largest nodule in particular is suspicious for a primary bronchogenic neoplasm. The other smaller nodules could be infectious/inflammatory related to postobstructive changes, or could represent metastatic lesions. At this time, there is widespread metastatic lymphadenopathy throughout the mediastinum, extrapleural lymph nodes throughout the left hemithorax, right supraclavicular region, left internal mammary lymph nodes, retrocrural lymph nodes, and retroperitoneal lymph nodes, as detailed above  Request now for biopsy for diagnosis Reviewed with Dr Earleen Newport Now scheduled for R supraclavicular lymph node biopsy  Past Medical History:  Diagnosis Date  . GERD (gastroesophageal reflux disease)   . Heart murmur   . HOH (hard of hearing)   . Rosacea   . Wears glasses     Past Surgical History:  Procedure Laterality Date  . APPENDECTOMY    . COLONOSCOPY    . HEMORROIDECTOMY    . INGUINAL HERNIA REPAIR Left 02/11/2014   Procedure: LEFT INGUINAL HERNIA REPAIR WITH MESH ;  Surgeon: Joyice Faster. Cornett, MD;  Location: [facility];  Service: General;  Laterality: Left;  . INSERTION OF MESH N/A 02/11/2014   Procedure: INSERTION OF MESH;  Surgeon: Joyice Faster. Cornett, MD;  Location: [facility];  Service: General;  Laterality: N/A;  . TONSILLECTOMY      Allergies: Review of patient's allergies indicates no known allergies.  Medications: Prior to Admission medications   Medication Sig Start Date End Date Taking? Authorizing Provider  ampicillin (PRINCIPEN) 500 MG capsule Take 500 mg by mouth daily.    Yes Historical Provider, MD  famotidine (PEPCID) 40 MG tablet Take 40 mg by mouth daily as needed for heartburn.    Yes Historical Provider, MD  Glucosamine-Chondroit-Vit C-Mn (GLUCOSAMINE 1500 COMPLEX) CAPS Take 1 capsule by mouth 3 (three) times daily.    Yes Historical Provider, MD  metroNIDAZOLE (METROGEL) 0.75 % gel Apply 1 application topically daily.  12/04/13  Yes Historical Provider, MD  Multiple Vitamins-Minerals (CENTRUM SILVER PO) Take 1 tablet by mouth daily.    Yes Historical Provider, MD  valACYclovir (VALTREX) 1000 MG tablet Take 1 tablet by mouth 3 (three) times daily. 03/08/16  Yes Historical Provider, MD  vitamin E 400 UNIT capsule Take 400 Units by mouth daily.   Yes Historical Provider, MD     Family History  Problem Relation Age of Onset  . COPD Mother   . Heart disease Father     Social History   Social History  . Marital status: Married    Spouse name: N/A  . Number of children: N/A  . Years of education: N/A   Social History Main Topics  . Smoking status: Never Smoker  . Smokeless tobacco: Never Used  .  Alcohol use Yes     Comment: occasional  . Drug use: No  . Sexual activity: Not Asked   Other Topics Concern  . None   Social History Narrative  . None    Review of Systems: A 12 point ROS discussed and pertinent positives are indicated in the HPI above.  All other systems are negative.  Review of Systems  Constitutional: Negative for activity  change, appetite change and fever.  Respiratory: Negative for shortness of breath.   Musculoskeletal: Positive for back pain.  Neurological: Negative for weakness.  Psychiatric/Behavioral: Negative for behavioral problems and confusion.    Vital Signs: BP (!) 151/79   Pulse 85   Temp 97.6 F (36.4 C) (Oral)   Resp 18   Ht 5\' 8"  (1.727 m)   Wt 200 lb (90.7 kg)   SpO2 100%   BMI 30.41 kg/m   Physical Exam  Constitutional: He is oriented to person, place, and time.  Cardiovascular: Normal rate, regular rhythm and normal heart sounds.   Pulmonary/Chest: Effort normal and breath sounds normal. He has no wheezes.  Abdominal: Soft. Bowel sounds are normal. There is no tenderness.  Musculoskeletal: Normal range of motion.  Neurological: He is alert and oriented to person, place, and time.  Skin: Skin is warm and dry.  Psychiatric: He has a normal mood and affect. His behavior is normal. Judgment and thought content normal.  Nursing note and vitals reviewed.   Mallampati Score:  MD Evaluation Airway: WNL Heart: WNL Abdomen: WNL Chest/ Lungs: WNL ASA  Classification: 2 Mallampati/Airway Score: Two  Imaging: Ct Chest Wo Contrast  Result Date: 03/12/2016 CLINICAL DATA:  Abnormal chest x-ray in left lower lobe. EXAM: CT CHEST WITHOUT CONTRAST TECHNIQUE: Multidetector CT imaging of the chest was performed following the standard protocol without IV contrast. COMPARISON:  Radiograph of March 08, 2016. FINDINGS: Cardiovascular: Coronary artery calcifications are noted. Cardiac size is within normal limits. Mediastinum/Nodes: Mediastinal adenopathy is noted with subcarinal adenopathy measuring 4.5 x 2.1 cm. Precarinal nodule measuring 17 x 13 mm is noted. Right peritracheal lymph node measuring 13 x 9 mm is noted. 28 x 8 mm note is noted in aortopulmonary window. Lungs/Pleura: 2.5 x 2.4 cm mass is seen in left lower lobe inferior to left hilum. 9 mm nodule is noted in superior segment of  left lower lobe. Multiple irregular densities are noted inferiorly in lingular segment, with the largest measuring 18 x 14 mm. Multiple other nodules are noted in the left lower lobe. Upper Abdomen: Moderate size hiatal hernia is noted. Nonobstructive right renal calculus is noted. Musculoskeletal: Multilevel degenerative changes are noted in the thoracic spine. IMPRESSION: 2.5 cm mass is seen medially in left lower lobe inferior to hilum. Mediastinal adenopathy is noted concerning for metastatic disease. Multiple nodules are noted in the left upper and lower lobes concerning for metastatic disease. PET scan is recommended for further evaluation. These results will be called to the ordering clinician or representative by the Radiologist Assistant, and communication documented in the PACS or zVision Dashboard. Coronary artery calcifications are noted suggesting Coronary artery disease. Moderate size hiatal hernia is noted. Nonobstructive right renal calculus. Electronically Signed   By: Marijo Conception, M.D.   On: 03/12/2016 13:56   Nm Pet Image Initial (pi) Skull Base To Thigh  Result Date: 03/20/2016 CLINICAL DATA:  Initial treatment strategy for lung lesion. EXAM: NUCLEAR MEDICINE PET SKULL BASE TO THIGH TECHNIQUE: 10.0 mCi F-18 FDG was injected intravenously. Full-ring PET imaging  was performed from the skull base to thigh after the radiotracer. CT data was obtained and used for attenuation correction and anatomic localization. FASTING BLOOD GLUCOSE:  Value: 105 mg/dl COMPARISON:  Chest CT 03/12/2016. FINDINGS: NECK No hypermetabolic lymph nodes in the neck. CHEST Previously noted large pulmonary nodule in the medial aspect of the left lower lobe measures 2.9 x 2.6 cm and is hypermetabolic (SUVmax = 99991111). Several other smaller nodular densities also noted throughout the left lower lobe and lingula are also hypermetabolic. There are numerous extrapleural nodular densities throughout the left hemithorax,  presumably extrapleural lymph nodes. These are diffusely hypermetabolic. Specific examples include a hypermetabolic (SUVmax = 0000000) 1 cm short axis lymph node (image 91 of series 4) in the posteromedial aspect of the left hemithorax (image 91 of series 4). There are numerous hypermetabolic mediastinal lymph nodes, the largest cluster of which is centered around the mid thoracic esophagus. Although these are difficult to discretely measure they are markedly hypermetabolic (SUVmax = 123456). Large 18 mm short axis juxta diaphragmatic lymph node noted in the lower middle mediastinum (image 86 of series 4) is hypermetabolic (SUVmax = 99991111). Superior mediastinal lymph nodes measuring up to 11 mm in short axis immediately anterior to the left common carotid artery origin (image 56 of series 4) are hypermetabolic (SUVmax = 8.8). Right supraclavicular and subpectoral lymphadenopathy is also noted. Nonenlarged but hypermetabolic left internal mammary lymph nodes are also noted (SUVmax = 8.0). There is aortic atherosclerosis, as well as atherosclerosis of the great vessels of the mediastinum and the coronary arteries, including calcified atherosclerotic plaque in the left main, left anterior descending, left circumflex and right coronary arteries. Calcifications of the aortic valve. Moderate-sized hiatal hernia. Multiple small hypermetabolic retrocrural lymph nodes. ABDOMEN/PELVIS No abnormal hypermetabolic activity within the liver, pancreas, adrenal glands, or spleen. Extensive retroperitoneal hypermetabolic lymphadenopathy, largest of which is a left periaortic node measuring 1.7 cm in short axis (image 126 of series 4) which is hypermetabolic (SUVmax = [id number]). 4 mm nonobstructive calculus in the upper pole collecting system of the right kidney. No ureteral stones or findings of urinary tract obstruction are noted at this time. No pathologic dilatation of small bowel or colon. Numerous colonic diverticulae are noted, without  definite surrounding inflammatory changes to suggest an acute diverticulitis at this time. There is a focal area of colonic wall thickening involving the mid sigmoid colon (image 160 of series 4) which demonstrates some low-level hypermetabolic activity (SUVmax = 6.2). SKELETON No focal hypermetabolic activity to suggest skeletal metastasis. IMPRESSION: 1. Previously noted pulmonary nodules are hypermetabolic, with the largest of these in the medial left lower lobe measuring 2.9 x 2.6 cm. This largest nodule in particular is suspicious for a primary bronchogenic neoplasm. The other smaller nodules could be infectious/inflammatory related to postobstructive changes, or could represent metastatic lesions. At this time, there is widespread metastatic lymphadenopathy throughout the mediastinum, extrapleural lymph nodes throughout the left hemithorax, right supraclavicular region, left internal mammary lymph nodes, retrocrural lymph nodes, and retroperitoneal lymph nodes, as detailed above. 2. Colonic diverticulosis. In the mid sigmoid colon there is a focal area of mural thickening which demonstrates some low-level hypermetabolic activity. This could be related to chronic inflammatory changes from underlying diverticular disease, however, correlation with nonemergent colonoscopy is recommended in the near future to exclude a primary colonic neoplasm. 3. Aortic atherosclerosis, in addition to left main and 3 vessel coronary artery disease. Please note that although the presence of coronary artery calcium documents the presence of  coronary artery disease, the severity of this disease and any potential stenosis cannot be assessed on this non-gated CT examination. Assessment for potential risk factor modification, dietary therapy or pharmacologic therapy may be warranted, if clinically indicated. 4. There are calcifications of the aortic valve. Echocardiographic correlation for evaluation of potential valvular dysfunction  may be warranted if clinically indicated. 5. Moderate-sized hiatal hernia. 6. Additional incidental findings, as above. Electronically Signed   By: Vinnie Langton M.D.   On: 03/20/2016 15:35   Labs:  CBC:  Recent Labs  03/27/16 1216  WBC 6.4  HGB 14.6  HCT 44.4  PLT 189    COAGS:  Recent Labs  03/27/16 1216  INR 1.32  APTT 30    BMP: No results for input(s): NA, K, CL, CO2, GLUCOSE, BUN, CALCIUM, CREATININE, GFRNONAA, GFRAA in the last 8760 hours.  Invalid input(s): CMP  LIVER FUNCTION TESTS: No results for input(s): BILITOT, AST, ALT, ALKPHOS, PROT, ALBUMIN in the last 8760 hours.  TUMOR MARKERS: No results for input(s): AFPTM, CEA, CA199, CHROMGRNA in the last 8760 hours.  Assessment and Plan:  Left lung mass +PET Imaging also reveals +LAN Now scheduled for R supraclavicular lymph node biopsy Risks and Benefits discussed with the patient including, but not limited to bleeding, infection, damage to adjacent structures or low yield requiring additional tests. All of the patient's questions were answered, patient is agreeable to proceed. Consent signed and in chart.   Thank you for this interesting consult.  I greatly enjoyed meeting Seth Gilbert and look forward to participating in their care.  A copy of this report was sent to the requesting provider on this date.  Electronically Signed: Mikyah Alamo A 03/27/2016, 2:00 PM   I spent a total of  30 Minutes   in face to face in clinical consultation, greater than 50% of which was counseling/coordinating care for R SCLN bx

## 2016-03-27 NOTE — Sedation Documentation (Signed)
Dr Earleen Newport at bs with pts wife, d/w them poc

## 2016-03-27 NOTE — Discharge Instructions (Signed)
Needle Biopsy, Care After °These instructions give you information about caring for yourself after your procedure. Your doctor may also give you more specific instructions. Call your doctor if you have any problems or questions after your procedure. °HOME CARE °· Rest as told by your doctor. °· Take medicines only as told by your doctor. °· There are many different ways to close and cover the biopsy site, including stitches (sutures), skin glue, and adhesive strips. Follow instructions from your doctor about: °¨ How to take care of your biopsy site. °¨ When and how you should change your bandage (dressing). °¨ When you should remove your dressing. °¨ Removing whatever was used to close your biopsy site. °· Check your biopsy site every day for signs of infection. Watch for: °¨ Redness, swelling, or pain. °¨ Fluid, blood, or pus. °GET HELP IF: °· You have a fever. °· You have redness, swelling, or pain at the biopsy site, and it lasts longer than a few days. °· You have fluid, blood, or pus coming from the biopsy site. °· You feel sick to your stomach (nauseous). °· You throw up (vomit). °GET HELP RIGHT AWAY IF: °· You are short of breath. °· You have trouble breathing. °· Your chest hurts. °· You feel dizzy or you pass out (faint). °· You have bleeding that does not stop with pressure or a bandage. °· You cough up blood. °· Your belly (abdomen) hurts. °  °This information is not intended to replace advice given to you by your health care provider. Make sure you discuss any questions you have with your health care provider. °  °Document Released: 07/26/2008 Document Revised: 12/28/2014 Document Reviewed: 08/09/2014 °[employer]vier Interactive Patient Education ©2016 [employer] ° °

## 2016-03-29 ENCOUNTER — Encounter: Payer: Self-pay | Admitting: Cardiothoracic Surgery

## 2016-03-29 ENCOUNTER — Ambulatory Visit (INDEPENDENT_AMBULATORY_CARE_PROVIDER_SITE_OTHER): Payer: Medicare Other | Admitting: Cardiothoracic Surgery

## 2016-03-29 ENCOUNTER — Other Ambulatory Visit: Payer: Self-pay

## 2016-03-29 ENCOUNTER — Encounter (HOSPITAL_COMMUNITY): Payer: Self-pay | Admitting: *Deleted

## 2016-03-29 VITALS — BP 122/74 | HR 96 | Resp 20 | Ht 68.0 in | Wt 200.0 lb

## 2016-03-29 DIAGNOSIS — R918 Other nonspecific abnormal finding of lung field: Secondary | ICD-10-CM

## 2016-03-29 DIAGNOSIS — R911 Solitary pulmonary nodule: Secondary | ICD-10-CM

## 2016-03-29 DIAGNOSIS — Z9889 Other specified postprocedural states: Secondary | ICD-10-CM

## 2016-03-29 NOTE — Progress Notes (Signed)
Pt states he has had a heart murmur that has come and gone over the years. Last time anyone heard it was in June, 2015. Pt denies any chest pain or sob.

## 2016-03-29 NOTE — Progress Notes (Signed)
[name]       ,[name] [facility] Medical Record [id number] Date of Birth: 26-Feb-1936  Referring: [name], MD Primary Care:  [name], MD  Chief Complaint:    Chief Complaint  Patient presents with  . Lung Lesion    f/u after CT BX, discuss results    History of Present Illness:    Seth Gilbert 80 y.o. male is seen in the office  today for Abnormal CT of the chest . The patient noted several weeks ago after doing yard work some left sided epigastric and left chest pain sometimes bothering during the night and Ritalin would be relieved by getting up. Because of the atypical pain sought medical attention chest x-ray was abnormal leading to a CT scan of the chest and a PET scan. The patient has no previous history of cardiac disease.  The patient denies fever chills or night sweats, his weight has been stable, he denies abdominal pain.  Patient has been a lifelong nonsmoker  A needle biopsy ultrasound-guided was done on the right supraclavicular node, unfortunately the pathology and this showed no diagnosis    Current Activity/ Functional Status:  Patient is independent with mobility/ambulation, transfers, ADL's, IADL's.   Zubrod Score: At the time of surgery this patient's most appropriate activity status/level should be described as: [x]     0    Normal activity, no symptoms []     1    Restricted in physical strenuous activity but ambulatory, able to do out light work []     2    Ambulatory and capable of self care, unable to do work activities, up and about               >50 % of waking hours                              []     3    Only limited self care, in bed greater than 50% of waking hours []     4    Completely disabled, no self care, confined to bed or chair []     5    Moribund   Past Medical History:  Diagnosis Date  . GERD (gastroesophageal reflux disease)    . Heart murmur   . HOH (hard of hearing)   . Rosacea   . Wears glasses     Past Surgical History:  Procedure Laterality Date  . APPENDECTOMY    . COLONOSCOPY    . HEMORROIDECTOMY    . INGUINAL HERNIA REPAIR Left 02/11/2014   Procedure: LEFT INGUINAL HERNIA REPAIR WITH MESH ;  Surgeon: Joyice Faster. Cornett, MD;  Location: [facility];  Service: General;  Laterality: Left;  . INSERTION OF MESH N/A 02/11/2014   Procedure: INSERTION OF MESH;  Surgeon: Joyice Faster. Cornett, MD;  Location: [facility];  Service: General;  Laterality: N/A;  . TONSILLECTOMY      Family History  Problem Relation Age of Onset  . COPD Mother   . Heart disease Father     Social History   Social History  . Marital status: Married    Spouse name: N/A  . Number of  children: N/A  . Years of education: N/A   Occupational History  . Not on file.   Social History Main Topics  . Smoking status: Never Smoker  . Smokeless tobacco: Never Used  . Alcohol use Yes     Comment: occasional  . Drug use: No  . Sexual activity: Not on file   Other Topics Concern  . Not on file   Social History Narrative  . No narrative on file    History  Smoking Status  . Never Smoker  Smokeless Tobacco  . Never Used    History  Alcohol Use  . Yes    Comment: occasional     No Known Allergies  Current Outpatient Prescriptions  Medication Sig Dispense Refill  . ampicillin (PRINCIPEN) 500 MG capsule Take 500 mg by mouth daily.     . famotidine (PEPCID) 40 MG tablet Take 40 mg by mouth daily as needed for heartburn.     . Glucosamine-Chondroit-Vit C-Mn (GLUCOSAMINE 1500 COMPLEX) CAPS Take 1 capsule by mouth 3 (three) times daily.     . metroNIDAZOLE (METROGEL) 0.75 % gel Apply 1 application topically daily.     . Multiple Vitamins-Minerals (CENTRUM SILVER PO) Take 1 tablet by mouth daily.     . vitamin E 400 UNIT capsule Take 400 Units by mouth daily.     No current  facility-administered medications for this visit.       Review of Systems:     Cardiac Review of Systems: Y or N  Chest Pain [ y   ]  Resting SOB [  n ] Exertional SOB  [ n ]  Orthopnea [n  ]   Pedal Edema [ n  ]    Palpitations [n  ] Syncope  [n  ]   Presyncope [  n ]  General Review of Systems: [Y] = yes [  ]=no Constitional: recent weight change [n  ];  Wt loss over the last 3 months [   ] anorexia [  ]; fatigue [  ]; nausea [  ]; night sweats [  ]; fever [n  ]; or chills [  ];          Dental: poor dentition[  ]; Last Dentist visit:   Eye : blurred vision [  ]; diplopia [   ]; vision changes [  ];  Amaurosis fugax[  ]; Resp: cough [  ];  wheezing[  ];  hemoptysis[  ]; shortness of breath[  ]; paroxysmal nocturnal dyspnea[  ]; dyspnea on exertion[  ]; or orthopnea[  ];  GI:  gallstones[  ], vomiting[ n ];  dysphagia[  ]; melena[ n ];  hematochezia [ n ]; heartburn[n  ];   Hx of  Colonoscopy[  ]; GU: kidney stones [  ]; hematuria[ n  ];   dysuria [  ];  nocturia[  ];  history of     obstruction [  ]; urinary frequency [  ]             Skin: rash, swelling[  ];, hair loss[  ];  peripheral edema[  ];  or itching[  ]; Musculosketetal: myalgias[  ];  joint swelling[  ];  joint erythema[  ];  joint pain[  ];  back pain[  ];  Heme/Lymph: bruising[  ];  bleeding[  ];  anemia[  ];  Neuro: TIA[n  ];  headaches[  ];  stroke[  ];  vertigo[  ];  seizures[  ];  paresthesias[  ];  difficulty walking[  ];  Psych:depression[  ]; anxiety[  ];  Endocrine: diabetes[  ];  thyroid dysfunction[  ];  Immunizations: Flu up to date [ y ]; Pneumococcal up to date [ y ];  Other:  Physical Exam: BP 122/74 (BP Location: Left Arm, Patient Position: Sitting, Cuff Size: Normal)   Pulse 96   Resp 20   Ht 5\' 8"  (1.727 m)   Wt 200 lb (90.7 kg)   SpO2 99% Comment: RA  BMI 30.41 kg/m   PHYSICAL EXAMINATION: General appearance: alert, cooperative and no distress Head: Normocephalic, without obvious abnormality,  atraumatic Neck: no adenopathy, no carotid bruit, no JVD, supple, symmetrical, trachea midline and thyroid not enlarged, symmetric, no tenderness/mass/nodules Lymph nodes: Cervical, supraclavicular, and axillary nodes normal. Resp: clear to auscultation bilaterally Back: symmetric, no curvature. ROM normal. No CVA tenderness. Cardio: regular rate and rhythm, S1, S2 normal, no murmur, click, rub or gallop GI: soft, non-tender; bowel sounds normal; no masses,  no organomegaly Extremities: extremities normal, atraumatic, no cyanosis or edema and Homans sign is negative, no sign of DVT Neurologic: Grossly normal  Diagnostic Studies & Laboratory data:     Recent Radiology Findings:    US Biopsy  Result Date: 03/27/2016 INDICATION: 80 year old male with a imaging diagnosis of metastatic disease. FDG positive supraclavicular adenopathy is reasonable site for biopsy. EXAM: ULTRASOUND BIOPSY CORE LIVER MEDICATIONS: None. ANESTHESIA/SEDATION: Moderate (conscious) sedation was employed during this procedure. A total of Versed 2.0 mg and Fentanyl 75 mcg was administered intravenously. Moderate Sedation Time: 20 minutes. The patient's level of consciousness and vital signs were monitored continuously by radiology nursing throughout the procedure under my direct supervision. FLUOROSCOPY TIME:  None COMPLICATIONS: None PROCEDURE: Informed written consent was obtained from the patient after a thorough discussion of the procedural risks, benefits and alternatives. All questions were addressed. Maximal Sterile Barrier Technique was utilized including caps, mask, sterile gowns, sterile gloves, sterile drape, hand hygiene and skin antiseptic. A timeout was performed prior to the initiation of the procedure. Patient positioned supine position on the ultrasound stretcher. Images of the right supraclavicular region were stored and sent to PACs. Patient is prepped and draped in usual sterile fashion. The skin and  subcutaneous tissues were generously infiltrated 1% lidocaine for local anesthesia. Small stab incision was made with 11 blade scalpel, and using ultrasound guidance, a 17 gauge biopsy gun was advanced into the supraclavicular lymph node of suspicious ultrasound characteristics. Multiple core biopsy were acquired. Tissue wound placed into formalin. Final image was stored. Patient tolerated the procedure well and remained hemodynamically stable throughout. No complications were encountered and no significant blood loss. IMPRESSION: Status post ultrasound-guided biopsy of right supraclavicular lymph node. Tissue specimen sent to pathology for complete histopathologic analysis. Signed, Dulcy Fanny. Earleen Newport, DO [facility] Radiology Electronically Signed   By: Corrie Mckusick D.O.   On: 03/27/2016 16:52    Ct Chest Wo Contrast  Result Date: 03/12/2016 CLINICAL DATA:  Abnormal chest x-ray in left lower lobe. EXAM: CT CHEST WITHOUT CONTRAST TECHNIQUE: Multidetector CT imaging of the chest was performed following the standard protocol without IV contrast. COMPARISON:  Radiograph of March 08, 2016. FINDINGS: Cardiovascular: Coronary artery calcifications are noted. Cardiac size is within normal limits. Mediastinum/Nodes: Mediastinal adenopathy is noted with subcarinal adenopathy measuring 4.5 x 2.1 cm. Precarinal nodule measuring 17 x 13 mm is noted. Right peritracheal lymph node measuring 13 x 9 mm is noted. 28 x 8 mm  note is noted in aortopulmonary window. Lungs/Pleura: 2.5 x 2.4 cm mass is seen in left lower lobe inferior to left hilum. 9 mm nodule is noted in superior segment of left lower lobe. Multiple irregular densities are noted inferiorly in lingular segment, with the largest measuring 18 x 14 mm. Multiple other nodules are noted in the left lower lobe. Upper Abdomen: Moderate size hiatal hernia is noted. Nonobstructive right renal calculus is noted. Musculoskeletal:  Multilevel degenerative changes are noted in the thoracic spine. IMPRESSION: 2.5 cm mass is seen medially in left lower lobe inferior to hilum. Mediastinal adenopathy is noted concerning for metastatic disease. Multiple nodules are noted in the left upper and lower lobes concerning for metastatic disease. PET scan is recommended for further evaluation. These results will be called to the ordering clinician or representative by the Radiologist Assistant, and communication documented in the PACS or zVision Dashboard. Coronary artery calcifications are noted suggesting Coronary artery disease. Moderate size hiatal hernia is noted. Nonobstructive right renal calculus. Electronically Signed   By: Marijo Conception, M.D.   On: 03/12/2016 13:56   Nm Pet Image Initial (pi) Skull Base To Thigh  Result Date: 03/20/2016 CLINICAL DATA:  Initial treatment strategy for lung lesion. EXAM: NUCLEAR MEDICINE PET SKULL BASE TO THIGH TECHNIQUE: 10.0 mCi F-18 FDG was injected intravenously. Full-ring PET imaging was performed from the skull base to thigh after the radiotracer. CT data was obtained and used for attenuation correction and anatomic localization. FASTING BLOOD GLUCOSE:  Value: 105 mg/dl COMPARISON:  Chest CT 03/12/2016. FINDINGS: NECK No hypermetabolic lymph nodes in the neck. CHEST Previously noted large pulmonary nodule in the medial aspect of the left lower lobe measures 2.9 x 2.6 cm and is hypermetabolic (SUVmax = 99991111). Several other smaller nodular densities also noted throughout the left lower lobe and lingula are also hypermetabolic. There are numerous extrapleural nodular densities throughout the left hemithorax, presumably extrapleural lymph nodes. These are diffusely hypermetabolic. Specific examples include a hypermetabolic (SUVmax = 0000000) 1 cm short axis lymph node (image 91 of series 4) in the posteromedial aspect of the left hemithorax (image 91 of series 4). There are numerous hypermetabolic mediastinal  lymph nodes, the largest cluster of which is centered around the mid thoracic esophagus. Although these are difficult to discretely measure they are markedly hypermetabolic (SUVmax = 123456). Large 18 mm short axis juxta diaphragmatic lymph node noted in the lower middle mediastinum (image 86 of series 4) is hypermetabolic (SUVmax = 99991111). Superior mediastinal lymph nodes measuring up to 11 mm in short axis immediately anterior to the left common carotid artery origin (image 56 of series 4) are hypermetabolic (SUVmax = 8.8). Right supraclavicular and subpectoral lymphadenopathy is also noted. Nonenlarged but hypermetabolic left internal mammary lymph nodes are also noted (SUVmax = 8.0). There is aortic atherosclerosis, as well as atherosclerosis of the great vessels of the mediastinum and the coronary arteries, including calcified atherosclerotic plaque in the left main, left anterior descending, left circumflex and right coronary arteries. Calcifications of the aortic valve. Moderate-sized hiatal hernia. Multiple small hypermetabolic retrocrural lymph nodes. ABDOMEN/PELVIS No abnormal hypermetabolic activity within the liver, pancreas, adrenal glands, or spleen. Extensive retroperitoneal hypermetabolic lymphadenopathy, largest of which is a left periaortic node measuring 1.7 cm in short axis (image 126 of series 4) which is hypermetabolic (SUVmax = [id number]). 4 mm nonobstructive calculus in the upper pole collecting system of the right kidney. No ureteral stones or findings of urinary tract obstruction are noted at this time.  No pathologic dilatation of small bowel or colon. Numerous colonic diverticulae are noted, without definite surrounding inflammatory changes to suggest an acute diverticulitis at this time. There is a focal area of colonic wall thickening involving the mid sigmoid colon (image 160 of series 4) which demonstrates some low-level hypermetabolic activity (SUVmax = 6.2). SKELETON No focal hypermetabolic  activity to suggest skeletal metastasis. IMPRESSION: 1. Previously noted pulmonary nodules are hypermetabolic, with the largest of these in the medial left lower lobe measuring 2.9 x 2.6 cm. This largest nodule in particular is suspicious for a primary bronchogenic neoplasm. The other smaller nodules could be infectious/inflammatory related to postobstructive changes, or could represent metastatic lesions. At this time, there is widespread metastatic lymphadenopathy throughout the mediastinum, extrapleural lymph nodes throughout the left hemithorax, right supraclavicular region, left internal mammary lymph nodes, retrocrural lymph nodes, and retroperitoneal lymph nodes, as detailed above. 2. Colonic diverticulosis. In the mid sigmoid colon there is a focal area of mural thickening which demonstrates some low-level hypermetabolic activity. This could be related to chronic inflammatory changes from underlying diverticular disease, however, correlation with nonemergent colonoscopy is recommended in the near future to exclude a primary colonic neoplasm. 3. Aortic atherosclerosis, in addition to left main and 3 vessel coronary artery disease. Please note that although the presence of coronary artery calcium documents the presence of coronary artery disease, the severity of this disease and any potential stenosis cannot be assessed on this non-gated CT examination. Assessment for potential risk factor modification, dietary therapy or pharmacologic therapy may be warranted, if clinically indicated. 4. There are calcifications of the aortic valve. Echocardiographic correlation for evaluation of potential valvular dysfunction may be warranted if clinically indicated. 5. Moderate-sized hiatal hernia. 6. Additional incidental findings, as above. Electronically Signed   By: Vinnie Langton M.D.   On: 03/20/2016 15:35    I have independently reviewed the above radiologic studies.  Recent Lab Findings: Lab Results    Component Value Date   WBC 6.4 03/27/2016   HGB 14.6 03/27/2016   HCT 44.4 03/27/2016   PLT 189 03/27/2016   GLUCOSE 115 (H) 02/09/2014   ALT 15 02/09/2014   AST 21 02/09/2014   NA 141 02/09/2014   K 4.4 02/09/2014   CL 101 02/09/2014   CREATININE 1.01 02/09/2014   BUN 19 02/09/2014   CO2 24 02/09/2014   INR 1.32 03/27/2016      Assessment / Plan:    1/ 2.5 cm mass is seen medially in left lower lobe inferior to hilum. Mediastinal adenopathy is noted concerning for metastatic disease. Multiple nodules are noted in the left upper and lower lobes concerning for metastatic disease.- Potentially could represent stage IV lung cancer, or alternatively lymphoma involving the abdomen and chest. Discuss the findings and possible diagnoses with the patient in detail and reviewed the scans with him. Options of bronchoscopy with ebus versus needle biopsy of one of the peripherally based hypermetabolic left lung lesions specifically the lesion on image 86 posteriorly on the left were discussed with the patient.   We proceeded with a request to radiology for CT-guided needle biopsy of the peripherally based left lung lesion, at the time a ultrasound-guided needle biopsy of a small right supraclavicular lymph node was done- unfortunately no diagnosis was made and were now confronted with the need for more tissue.  I discussed with patient proceeding tomorrow with bronchoscopy ebus and ENB possible mediastinoscopy to obtain a tissue diagnosis Wrist and options were discussed with him in detail.  2/Colonic diverticulosis 3/Aortic atherosclerosis, in addition to left main and 3 vessel coronary artery clacification- asymptomatic 4/Moderate-sized hiatal hernia . Grace Isaac MD      [address] [place] [address] Office [phone]   Beeper [phone]  03/29/2016 2:08 PM

## 2016-03-30 ENCOUNTER — Ambulatory Visit (HOSPITAL_COMMUNITY)
Admission: RE | Admit: 2016-03-30 | Discharge: 2016-03-30 | Disposition: A | Discharge status: Home / Self Care | Payer: Medicare Other | Source: Ambulatory Visit | Attending: Cardiothoracic Surgery | Admitting: Cardiothoracic Surgery

## 2016-03-30 ENCOUNTER — Ambulatory Visit (HOSPITAL_COMMUNITY): Payer: Medicare Other

## 2016-03-30 ENCOUNTER — Encounter (HOSPITAL_COMMUNITY): Payer: Self-pay | Admitting: Certified Registered Nurse Anesthetist

## 2016-03-30 ENCOUNTER — Ambulatory Visit (HOSPITAL_COMMUNITY): Payer: Medicare Other | Admitting: Certified Registered Nurse Anesthetist

## 2016-03-30 ENCOUNTER — Encounter (HOSPITAL_COMMUNITY)
Admission: RE | Disposition: A | Discharge status: Home / Self Care | Payer: Self-pay | Source: Ambulatory Visit | Attending: Cardiothoracic Surgery

## 2016-03-30 DIAGNOSIS — Z792 Long term (current) use of antibiotics: Secondary | ICD-10-CM | POA: Insufficient documentation

## 2016-03-30 DIAGNOSIS — Z79899 Other long term (current) drug therapy: Secondary | ICD-10-CM | POA: Diagnosis not present

## 2016-03-30 DIAGNOSIS — R911 Solitary pulmonary nodule: Secondary | ICD-10-CM

## 2016-03-30 DIAGNOSIS — R896 Abnormal cytological findings in specimens from other organs, systems and tissues: Secondary | ICD-10-CM | POA: Diagnosis not present

## 2016-03-30 DIAGNOSIS — Z01818 Encounter for other preprocedural examination: Secondary | ICD-10-CM | POA: Diagnosis not present

## 2016-03-30 DIAGNOSIS — R590 Localized enlarged lymph nodes: Secondary | ICD-10-CM | POA: Insufficient documentation

## 2016-03-30 DIAGNOSIS — R222 Localized swelling, mass and lump, trunk: Secondary | ICD-10-CM | POA: Diagnosis not present

## 2016-03-30 DIAGNOSIS — R918 Other nonspecific abnormal finding of lung field: Secondary | ICD-10-CM | POA: Diagnosis not present

## 2016-03-30 DIAGNOSIS — K219 Gastro-esophageal reflux disease without esophagitis: Secondary | ICD-10-CM | POA: Insufficient documentation

## 2016-03-30 HISTORY — PX: VIDEO BRONCHOSCOPY WITH ENDOBRONCHIAL ULTRASOUND: SHX6177

## 2016-03-30 HISTORY — DX: Personal history of other diseases of the digestive system: Z87.19

## 2016-03-30 HISTORY — DX: Diverticulitis of intestine, part unspecified, without perforation or abscess without bleeding: K57.92

## 2016-03-30 HISTORY — DX: Other specified postprocedural states: Z98.890

## 2016-03-30 HISTORY — DX: Nausea with vomiting, unspecified: R11.2

## 2016-03-30 LAB — TYPE AND SCREEN
ABO/RH(D): A NEG
Antibody Screen: NEGATIVE

## 2016-03-30 LAB — COMPREHENSIVE METABOLIC PANEL
ALT: 14 U/L — ABNORMAL LOW (ref 17–63)
AST: 21 U/L (ref 15–41)
Albumin: 3.7 g/dL (ref 3.5–5.0)
Alkaline Phosphatase: 61 U/L (ref 38–126)
Anion gap: 5 (ref 5–15)
BUN: 13 mg/dL (ref 6–20)
CO2: 27 mmol/L (ref 22–32)
Calcium: 9.2 mg/dL (ref 8.9–10.3)
Chloride: 107 mmol/L (ref 101–111)
Creatinine, Ser: 1.13 mg/dL (ref 0.61–1.24)
GFR calc Af Amer: >60 mL/min (ref >60)
GFR calc non Af Amer: >60 mL/min (ref >60)
Glucose, Bld: 112 mg/dL — ABNORMAL HIGH (ref 65–99)
Potassium: 4.7 mmol/L (ref 3.5–5.1)
Sodium: 139 mmol/L (ref 135–145)
Total Bilirubin: 0.7 mg/dL (ref 0.3–1.2)
Total Protein: 6.5 g/dL (ref 6.5–8.1)

## 2016-03-30 LAB — PROTIME-INR
INR: 1.21
Prothrombin Time: 15.4 seconds — ABNORMAL HIGH (ref 11.4–15.2)

## 2016-03-30 LAB — ABO/RH: ABO/RH(D): A NEG

## 2016-03-30 LAB — SURGICAL PCR SCREEN
MRSA, PCR: NEGATIVE
Staphylococcus aureus: NEGATIVE

## 2016-03-30 SURGERY — BRONCHOSCOPY, WITH EBUS
Anesthesia: General

## 2016-03-30 MED ORDER — GLYCOPYRROLATE 0.2 MG/ML IV SOSY
PREFILLED_SYRINGE | INTRAVENOUS | Status: AC
  Filled 2016-03-30: qty 3

## 2016-03-30 MED ORDER — PHENYLEPHRINE HCL 10 MG/ML IJ SOLN
INTRAVENOUS | Status: DC | PRN
  Administered 2016-03-30: 10 ug/min via INTRAVENOUS

## 2016-03-30 MED ORDER — NEOSTIGMINE METHYLSULFATE 10 MG/10ML IV SOLN
INTRAVENOUS | Status: DC | PRN
  Administered 2016-03-30: 3 mg via INTRAVENOUS

## 2016-03-30 MED ORDER — HYDROMORPHONE HCL 1 MG/ML IJ SOLN: 0.2500 mg | INTRAMUSCULAR | Status: DC | PRN

## 2016-03-30 MED ORDER — 0.9 % SODIUM CHLORIDE (POUR BTL) OPTIME
TOPICAL | Status: DC | PRN
  Administered 2016-03-30: 1000 mL

## 2016-03-30 MED ORDER — PROMETHAZINE HCL 25 MG/ML IJ SOLN: 6.2500 mg | INTRAMUSCULAR | Status: DC | PRN

## 2016-03-30 MED ORDER — ARTIFICIAL TEARS OP OINT
TOPICAL_OINTMENT | OPHTHALMIC | Status: DC | PRN
  Administered 2016-03-30: 1 via OPHTHALMIC

## 2016-03-30 MED ORDER — DEXTROSE 5 % IV SOLN
1.5000 g | INTRAVENOUS | Status: AC
  Administered 2016-03-30: 1.5 g via INTRAVENOUS
  Filled 2016-03-30: qty 1.5

## 2016-03-30 MED ORDER — CHLORHEXIDINE GLUCONATE CLOTH 2 % EX PADS: 6.0000 | MEDICATED_PAD | Freq: Once | CUTANEOUS | Status: DC

## 2016-03-30 MED ORDER — FENTANYL CITRATE (PF) 250 MCG/5ML IJ SOLN
INTRAMUSCULAR | Status: AC
  Filled 2016-03-30: qty 5

## 2016-03-30 MED ORDER — ROCURONIUM BROMIDE 100 MG/10ML IV SOLN
INTRAVENOUS | Status: DC | PRN
  Administered 2016-03-30: 50 mg via INTRAVENOUS

## 2016-03-30 MED ORDER — MUPIROCIN 2 % EX OINT
TOPICAL_OINTMENT | CUTANEOUS | Status: AC
  Administered 2016-03-30: 1 via TOPICAL
  Filled 2016-03-30: qty 22

## 2016-03-30 MED ORDER — GLYCOPYRROLATE 0.2 MG/ML IJ SOLN
INTRAMUSCULAR | Status: DC | PRN
  Administered 2016-03-30: 0.6 mg via INTRAVENOUS

## 2016-03-30 MED ORDER — PROPOFOL 10 MG/ML IV BOLUS
INTRAVENOUS | Status: DC | PRN
  Administered 2016-03-30: 200 mg via INTRAVENOUS

## 2016-03-30 MED ORDER — ONDANSETRON HCL 4 MG/2ML IJ SOLN
INTRAMUSCULAR | Status: DC | PRN
  Administered 2016-03-30: 4 mg via INTRAVENOUS

## 2016-03-30 MED ORDER — LACTATED RINGERS IV SOLN
INTRAVENOUS | Status: DC
  Administered 2016-03-30 (×2): via INTRAVENOUS

## 2016-03-30 MED ORDER — FENTANYL CITRATE (PF) 100 MCG/2ML IJ SOLN
INTRAMUSCULAR | Status: DC | PRN
  Administered 2016-03-30 (×2): 50 ug via INTRAVENOUS

## 2016-03-30 MED ORDER — MUPIROCIN 2 % EX OINT
1.0000 "application " | TOPICAL_OINTMENT | Freq: Once | CUTANEOUS | Status: AC
  Administered 2016-03-30: 1 via TOPICAL

## 2016-03-30 MED ORDER — EPINEPHRINE HCL 1 MG/ML IJ SOLN
INTRAMUSCULAR | Status: AC
  Filled 2016-03-30: qty 1

## 2016-03-30 MED ORDER — LIDOCAINE 2% (20 MG/ML) 5 ML SYRINGE
INTRAMUSCULAR | Status: AC
  Filled 2016-03-30: qty 5

## 2016-03-30 MED ORDER — SUCCINYLCHOLINE CHLORIDE 200 MG/10ML IV SOSY
PREFILLED_SYRINGE | INTRAVENOUS | Status: AC
  Filled 2016-03-30: qty 10

## 2016-03-30 MED ORDER — PROPOFOL 10 MG/ML IV BOLUS
INTRAVENOUS | Status: AC
  Filled 2016-03-30: qty 40

## 2016-03-30 MED ORDER — ONDANSETRON HCL 4 MG/2ML IJ SOLN
INTRAMUSCULAR | Status: AC
  Filled 2016-03-30: qty 2

## 2016-03-30 MED ORDER — EPHEDRINE 5 MG/ML INJ
INTRAVENOUS | Status: AC
  Filled 2016-03-30: qty 10

## 2016-03-30 MED ORDER — ROCURONIUM BROMIDE 50 MG/5ML IV SOLN
INTRAVENOUS | Status: AC
  Filled 2016-03-30: qty 1

## 2016-03-30 MED ORDER — PHENYLEPHRINE 40 MCG/ML (10ML) SYRINGE FOR IV PUSH (FOR BLOOD PRESSURE SUPPORT)
PREFILLED_SYRINGE | INTRAVENOUS | Status: AC
  Filled 2016-03-30: qty 10

## 2016-03-30 MED ORDER — NEOSTIGMINE METHYLSULFATE 5 MG/5ML IV SOSY
PREFILLED_SYRINGE | INTRAVENOUS | Status: AC
  Filled 2016-03-30: qty 5

## 2016-03-30 MED ORDER — LIDOCAINE HCL (CARDIAC) 20 MG/ML IV SOLN
INTRAVENOUS | Status: DC | PRN
  Administered 2016-03-30: 100 mg via INTRAVENOUS

## 2016-03-30 SURGICAL SUPPLY — 49 items
BLADE SURG 10 STRL SS (BLADE) ×2 IMPLANT
BRUSH CYTOL CELLEBRITY 1.5X140 (MISCELLANEOUS) IMPLANT
CANISTER SUCTION 2500CC (MISCELLANEOUS) ×4 IMPLANT
CLIP TI MEDIUM 6 (CLIP) IMPLANT
CONT SPEC 4OZ CLIKSEAL STRL BL (MISCELLANEOUS) ×6 IMPLANT
COVER DOME SNAP 22 D (MISCELLANEOUS) ×2 IMPLANT
COVER SURGICAL LIGHT HANDLE (MISCELLANEOUS) ×4 IMPLANT
COVER TABLE BACK 60X90 (DRAPES) ×2 IMPLANT
DERMABOND ADVANCED (GAUZE/BANDAGES/DRESSINGS) ×1
DERMABOND ADVANCED .7 DNX12 (GAUZE/BANDAGES/DRESSINGS) ×1 IMPLANT
DRAPE LAPAROTOMY T 102X78X121 (DRAPES) ×2 IMPLANT
DRSG AQUACEL AG ADV 3.5X14 (GAUZE/BANDAGES/DRESSINGS) ×2 IMPLANT
ELECT CAUTERY BLADE 6.4 (BLADE) ×2 IMPLANT
ELECT REM PT RETURN 9FT ADLT (ELECTROSURGICAL) ×2
ELECTRODE REM PT RTRN 9FT ADLT (ELECTROSURGICAL) ×1 IMPLANT
FORCEPS BIOP RJ4 1.8 (CUTTING FORCEPS) IMPLANT
GAUZE SPONGE 4X4 12PLY STRL (GAUZE/BANDAGES/DRESSINGS) ×4 IMPLANT
GAUZE SPONGE 4X4 16PLY XRAY LF (GAUZE/BANDAGES/DRESSINGS) ×2 IMPLANT
GLOVE BIO SURGEON STRL SZ 6.5 (GLOVE) ×4 IMPLANT
GOWN STRL REUS W/ TWL LRG LVL3 (GOWN DISPOSABLE) ×1 IMPLANT
GOWN STRL REUS W/TWL LRG LVL3 (GOWN DISPOSABLE) ×1
HEMOSTAT SURGICEL 2X14 (HEMOSTASIS) IMPLANT
KIT BASIN OR (CUSTOM PROCEDURE TRAY) ×2 IMPLANT
KIT CLEAN ENDO COMPLIANCE (KITS) ×6 IMPLANT
KIT ROOM TURNOVER OR (KITS) ×4 IMPLANT
MARKER SKIN DUAL TIP RULER LAB (MISCELLANEOUS) ×2 IMPLANT
NEEDLE BIOPSY TRANSBRONCH 21G (NEEDLE) IMPLANT
NEEDLE BLUNT 18X1 FOR OR ONLY (NEEDLE) IMPLANT
NEEDLE EBUS SONO TIP PENTAX (NEEDLE) ×4 IMPLANT
NS IRRIG 1000ML POUR BTL (IV SOLUTION) ×4 IMPLANT
OIL SILICONE PENTAX (PARTS (SERVICE/REPAIRS)) ×2 IMPLANT
PACK SURGICAL SETUP 50X90 (CUSTOM PROCEDURE TRAY) ×2 IMPLANT
PAD ARMBOARD 7.5X6 YLW CONV (MISCELLANEOUS) ×8 IMPLANT
PENCIL BUTTON HOLSTER BLD 10FT (ELECTRODE) ×2 IMPLANT
SPONGE INTESTINAL PEANUT (DISPOSABLE) IMPLANT
STAPLER VISISTAT 35W (STAPLE) IMPLANT
SUT VIC AB 3-0 SH 18 (SUTURE) ×2 IMPLANT
SUT VICRYL 4-0 PS2 18IN ABS (SUTURE) ×2 IMPLANT
SWAB COLLECTION DEVICE MRSA (MISCELLANEOUS) IMPLANT
SYR 20CC LL (SYRINGE) ×2 IMPLANT
SYR 20ML ECCENTRIC (SYRINGE) ×2 IMPLANT
SYRINGE 10CC LL (SYRINGE) ×2 IMPLANT
TOWEL OR 17X24 6PK STRL BLUE (TOWEL DISPOSABLE) ×4 IMPLANT
TOWEL OR 17X26 10 PK STRL BLUE (TOWEL DISPOSABLE) ×2 IMPLANT
TRAP SPECIMEN MUCOUS 40CC (MISCELLANEOUS) ×2 IMPLANT
TUBE ANAEROBIC SPECIMEN COL (MISCELLANEOUS) IMPLANT
TUBE CONNECTING 12X1/4 (SUCTIONS) ×2 IMPLANT
TUBE CONNECTING 20X1/4 (TUBING) ×2 IMPLANT
WATER STERILE IRR 1000ML POUR (IV SOLUTION) ×4 IMPLANT

## 2016-03-30 NOTE — Discharge Instructions (Signed)
Flexible Bronchoscopy, Care After °Refer to this sheet in the next few weeks. These instructions provide you with information on caring for yourself after your procedure. Your health care provider may also give you more specific instructions. Your treatment has been planned according to current medical practices, but problems sometimes occur. Call your health care provider if you have any problems or questions after your procedure.  °WHAT TO EXPECT AFTER THE PROCEDURE °It is normal to have the following symptoms for 24-48 hours after the procedure:  °· Increased cough. °· Low-grade fever. °· Sore throat or hoarse voice. °· Small streaks of blood in your thick spit (sputum) if tissue samples were taken (biopsy). °HOME CARE INSTRUCTIONS  °· Do not eat or drink anything for 2 hours after your procedure. Your nose and throat were numbed by medicine. If you try to eat or drink before the medicine wears off, food or drink could go into your lungs or you could burn yourself. After the numbness is gone and your cough and gag reflexes have returned, you may eat soft food and drink liquids slowly.   °· The day after the procedure, you can go back to your normal diet.   °· You may resume normal activities.   °· Keep all follow-up visits as directed by your health care provider. It is important to keep all your appointments, especially if tissue samples were taken for testing (biopsy). °SEEK IMMEDIATE MEDICAL CARE IF:  °· You have increasing shortness of breath.   °· You become light-headed or faint.   °· You have chest pain.   °· You have any new concerning symptoms. °· You cough up more than a small amount of blood. °· The amount of blood you cough up increases. °MAKE SURE YOU: °· Understand these instructions. °· Will watch your condition. °· Will get help right away if you are not doing well or get worse. °  °This information is not intended to replace advice given to you by your health care provider. Make sure you discuss  any questions you have with your health care provider. °  °Document Released: 03/02/2005 Document Revised: 09/03/2014 Document Reviewed: 04/17/2013 °Elsevier Interactive Patient Education ©2016 [employer] ° °

## 2016-03-30 NOTE — Anesthesia Postprocedure Evaluation (Signed)
Anesthesia Post Note  Patient: Seth Gilbert  Procedure(s) Performed: Procedure(s) (LRB): VIDEO BRONCHOSCOPY WITH ENDOBRONCHIAL ULTRASOUND,WITH TRANSBRONCHIAL BIOPSY (N/A)  Patient location during evaluation: PACU Anesthesia Type: General Level of consciousness: awake and alert Pain management: pain level controlled Vital Signs Assessment: post-procedure vital signs reviewed and stable Respiratory status: spontaneous breathing, nonlabored ventilation, respiratory function stable and patient connected to nasal cannula oxygen Cardiovascular status: blood pressure returned to baseline and stable Postop Assessment: no signs of nausea or vomiting Anesthetic complications: no    Last Vitals:  Vitals:   03/30/16 1354 03/30/16 1400  BP: (!) 98/56   Pulse: 65 75  Resp: (!) 22 20  Temp:      Last Pain:  Vitals:   03/30/16 0840  TempSrc: Oral                 [name],JAMES TERRILL

## 2016-03-30 NOTE — Brief Op Note (Signed)
      [address]       [place] [address]             [phone]     03/30/2016  12:44 PM  PATIENT:  Seth Gilbert  80 y.o. male  PRE-OPERATIVE DIAGNOSIS:  LUNG MASS  POST-OPERATIVE DIAGNOSIS:  LUNG MASS/ malignant by quick smear final path pending   PROCEDURE:  Procedure(s): VIDEO BRONCHOSCOPY WITH ENDOBRONCHIAL ULTRASOUND,and TRANSBRONCHIAL BIOPSY (N/A)  SURGEON:  Surgeon(s) and Role:    * Grace Isaac, MD - Primary   ANESTHESIA:   general  EBL:  Total I/O In: 1000 [I.V.:1000] Out: 5 [Blood:5]  BLOOD ADMINISTERED:none  DRAINS: none   LOCAL MEDICATIONS USED:  NONE  SPECIMEN:  Source of Specimen:  # 7 and 10 L nodes  DISPOSITION OF SPECIMEN:  PATHOLOGY  COUNTS:  YES  TOURNIQUET:  * No tourniquets in log *  DICTATION: .Dragon Dictation  PLAN OF CARE: Discharge to home after PACU  PATIENT DISPOSITION:  PACU - hemodynamically stable.   Delay start of Pharmacological VTE agent (>24hrs) due to surgical blood loss or risk of bleeding: yes

## 2016-03-30 NOTE — Anesthesia Preprocedure Evaluation (Addendum)
Anesthesia Evaluation  Patient identified by MRN, date of birth, ID band Patient awake    Reviewed: NPO status , Patient's Chart, lab work & pertinent test results  History of Anesthesia Complications (+) PONV and history of anesthetic complications  Airway Mallampati: I  TM Distance: >3 FB Neck ROM: Full    Dental  (+) Teeth Intact, Dental Advisory Given, Caps   Pulmonary    breath sounds clear to auscultation       Cardiovascular negative cardio ROS   Rhythm:Regular Rate:Normal     Neuro/Psych    GI/Hepatic hiatal hernia, GERD  ,  Endo/Other  negative endocrine ROS  Renal/GU Renal disease     Musculoskeletal   Abdominal   Peds  Hematology   Anesthesia Other Findings   Reproductive/Obstetrics                            Anesthesia Physical Anesthesia Plan  ASA: III  Anesthesia Plan: General   Post-op Pain Management:    Induction: Intravenous  Airway Management Planned: Oral ETT  Additional Equipment:   Intra-op Plan:   Post-operative Plan: Extubation in OR  Informed Consent: I have reviewed the patients History and Physical, chart, labs and discussed the procedure including the risks, benefits and alternatives for the proposed anesthesia with the patient or authorized representative who has indicated his/her understanding and acceptance.   Dental advisory given  Plan Discussed with: CRNA  Anesthesia Plan Comments:         Anesthesia Quick Evaluation

## 2016-03-30 NOTE — H&P (Signed)
[address]       [place] [address]             [phone]                    Seth Gilbert [place] Medical Record [id number] Date of Birth: 1935-10-02  Referring:Dr Harrington Challenger  Primary Care:  [name], MD  Chief Complaint:    Lung mass   History of Present Illness:    Seth Gilbert 80 y.o. male is seen in the office for Abnormal CT of the chest . The patient noted several weeks ago after doing yard work some left sided epigastric and left chest pain sometimes bothering during the night and Ritalin would be relieved by getting up. Because of the atypical pain sought medical attention chest x-ray was abnormal leading to a CT scan of the chest and a PET scan. The patient has no previous history of cardiac disease.  The patient denies fever chills or night sweats, his weight has been stable, he denies abdominal pain.  Patient has been a lifelong nonsmoker  A needle biopsy ultrasound-guided was done on the right supraclavicular node, unfortunately the pathology and this showed no diagnosis    Current Activity/ Functional Status:  Patient is independent with mobility/ambulation, transfers, ADL's, IADL's.   Zubrod Score: At the time of surgery this patient's most appropriate activity status/level should be described as: [x]     0    Normal activity, no symptoms []     1    Restricted in physical strenuous activity but ambulatory, able to do out light work []     2    Ambulatory and capable of self care, unable to do work activities, up and about               >50 % of waking hours                              []     3    Only limited self care, in bed greater than 50% of waking hours []     4    Completely disabled, no self care, confined to bed or chair []     5    Moribund   Past Medical History:  Diagnosis Date  . Diverticulitis   . GERD (gastroesophageal reflux disease)    Heartburn at times  . Heart murmur    "comes and goes" onset 4th grade -  last time anyone heard it was 01/2014  . History of hiatal hernia   . HOH (hard of hearing)   . Kidney stones    last time late 1990's  . PONV (postoperative nausea and vomiting)   . Rosacea   . Wears glasses     Past Surgical History:  Procedure Laterality Date  . APPENDECTOMY    . COLONOSCOPY    . HEMORROIDECTOMY     inflammed anal gland removed  . INGUINAL HERNIA REPAIR Left 02/11/2014   Procedure: LEFT INGUINAL HERNIA REPAIR WITH MESH ;  Surgeon: Joyice Faster. Cornett, MD;  Location: [facility];  Service: General;  Laterality: Left;  . INSERTION OF MESH N/A 02/11/2014   Procedure: INSERTION OF MESH;  Surgeon: Joyice Faster. Cornett, MD;  Location: [facility];  Service: General;  Laterality: N/A;  . TONSILLECTOMY      Family History  Problem Relation Age of  Onset  . COPD Mother   . Heart disease Father     Social History   Social History  . Marital status: Married    Spouse name: N/A  . Number of children: N/A  . Years of education: N/A   Occupational History  . Not on file.   Social History Main Topics  . Smoking status: Never Smoker  . Smokeless tobacco: Never Used  . Alcohol use Yes     Comment: occasional  . Drug use: No  . Sexual activity: Not on file   Other Topics Concern  . Not on file   Social History Narrative  . No narrative on file    History  Smoking Status  . Never Smoker  Smokeless Tobacco  . Never Used    History  Alcohol Use  . Yes    Comment: occasional     No Known Allergies  No current facility-administered medications for this encounter.       Review of Systems:     Cardiac Review of Systems: Y or N  Chest Pain [ y   ]  Resting SOB [  n ] Exertional SOB  [ n ]  Orthopnea [n  ]   Pedal Edema [ n  ]    Palpitations [n  ] Syncope  [n  ]   Presyncope [  n ]  General Review of Systems: [Y] = yes [  ]=no Constitional: recent weight change [n  ];  Wt loss over the last 3 months [   ] anorexia [  ];  fatigue [  ]; nausea [  ]; night sweats [  ]; fever [n  ]; or chills [  ];          Dental: poor dentition[  ]; Last Dentist visit:   Eye : blurred vision [  ]; diplopia [   ]; vision changes [  ];  Amaurosis fugax[  ]; Resp: cough [  ];  wheezing[  ];  hemoptysis[  ]; shortness of breath[  ]; paroxysmal nocturnal dyspnea[  ]; dyspnea on exertion[  ]; or orthopnea[  ];  GI:  gallstones[  ], vomiting[ n ];  dysphagia[  ]; melena[ n ];  hematochezia [ n ]; heartburn[n  ];   Hx of  Colonoscopy[  ]; GU: kidney stones [  ]; hematuria[ n  ];   dysuria [  ];  nocturia[  ];  history of     obstruction [  ]; urinary frequency [  ]             Skin: rash, swelling[  ];, hair loss[  ];  peripheral edema[  ];  or itching[  ]; Musculosketetal: myalgias[  ];  joint swelling[  ];  joint erythema[  ];  joint pain[  ];  back pain[  ];  Heme/Lymph: bruising[  ];  bleeding[  ];  anemia[  ];  Neuro: TIA[n  ];  headaches[  ];  stroke[  ];  vertigo[  ];  seizures[  ];   paresthesias[  ];  difficulty walking[  ];  Psych:depression[  ]; anxiety[  ];  Endocrine: diabetes[  ];  thyroid dysfunction[  ];  Immunizations: Flu up to date [ y ]; Pneumococcal up to date [ y ];  Other:  Physical Exam: There were no vitals taken for this visit.  PHYSICAL EXAMINATION: General appearance: alert, cooperative and no distress Head: Normocephalic, without obvious abnormality, atraumatic Neck: no adenopathy, no carotid  bruit, no JVD, supple, symmetrical, trachea midline and thyroid not enlarged, symmetric, no tenderness/mass/nodules Lymph nodes: Cervical, supraclavicular, and axillary nodes normal. Resp: clear to auscultation bilaterally Back: symmetric, no curvature. ROM normal. No CVA tenderness. Cardio: regular rate and rhythm, S1, S2 normal, no murmur, click, rub or gallop GI: soft, non-tender; bowel sounds normal; no masses,  no organomegaly Extremities: extremities normal, atraumatic, no cyanosis or edema and Homans sign is  negative, no sign of DVT Neurologic: Grossly normal  Diagnostic Studies & Laboratory data:     Recent Radiology Findings:    US Biopsy  Result Date: 03/27/2016 INDICATION: 80 year old male with a imaging diagnosis of metastatic disease. FDG positive supraclavicular adenopathy is reasonable site for biopsy. EXAM: ULTRASOUND BIOPSY CORE LIVER MEDICATIONS: None. ANESTHESIA/SEDATION: Moderate (conscious) sedation was employed during this procedure. A total of Versed 2.0 mg and Fentanyl 75 mcg was administered intravenously. Moderate Sedation Time: 20 minutes. The patient's level of consciousness and vital signs were monitored continuously by radiology nursing throughout the procedure under my direct supervision. FLUOROSCOPY TIME:  None COMPLICATIONS: None PROCEDURE: Informed written consent was obtained from the patient after a thorough discussion of the procedural risks, benefits and alternatives. All questions were addressed. Maximal Sterile Barrier Technique was utilized including caps, mask, sterile gowns, sterile gloves, sterile drape, hand hygiene and skin antiseptic. A timeout was performed prior to the initiation of the procedure. Patient positioned supine position on the ultrasound stretcher. Images of the right supraclavicular region were stored and sent to PACs. Patient is prepped and draped in usual sterile fashion. The skin and subcutaneous tissues were generously infiltrated 1% lidocaine for local anesthesia. Small stab incision was made with 11 blade scalpel, and using ultrasound guidance, a 17 gauge biopsy gun was advanced into the supraclavicular lymph node of suspicious ultrasound characteristics. Multiple core biopsy were acquired. Tissue wound placed into formalin. Final image was stored. Patient tolerated the procedure well and remained hemodynamically stable throughout. No complications were encountered and no significant blood loss. IMPRESSION: Status post ultrasound-guided biopsy of  right supraclavicular lymph node. Tissue specimen sent to pathology for complete histopathologic analysis. Signed, Dulcy Fanny. Earleen Newport, DO [facility] Radiology Electronically Signed   By: Corrie Mckusick D.O.   On: 03/27/2016 16:52    Ct Chest Wo Contrast  Result Date: 03/12/2016 CLINICAL DATA:  Abnormal chest x-ray in left lower lobe. EXAM: CT CHEST WITHOUT CONTRAST TECHNIQUE: Multidetector CT imaging of the chest was performed following the standard protocol without IV contrast. COMPARISON:  Radiograph of March 08, 2016. FINDINGS: Cardiovascular: Coronary artery calcifications are noted. Cardiac size is within normal limits. Mediastinum/Nodes: Mediastinal adenopathy is noted with subcarinal adenopathy measuring 4.5 x 2.1 cm. Precarinal nodule measuring 17 x 13 mm is noted. Right peritracheal lymph node measuring 13 x 9 mm is noted. 28 x 8 mm note is noted in aortopulmonary window. Lungs/Pleura: 2.5 x 2.4 cm mass is seen in left lower lobe inferior to left hilum. 9 mm nodule is noted in superior segment of left lower lobe. Multiple irregular densities are noted inferiorly in lingular segment, with the largest measuring 18 x 14 mm. Multiple other nodules are noted in the left lower lobe. Upper Abdomen: Moderate size hiatal hernia is noted. Nonobstructive right renal calculus is noted. Musculoskeletal: Multilevel degenerative changes are noted in the thoracic spine. IMPRESSION: 2.5 cm mass is seen medially in left lower lobe inferior to hilum. Mediastinal adenopathy is noted concerning for metastatic disease. Multiple nodules are noted in  the left upper and lower lobes concerning for metastatic disease. PET scan is recommended for further evaluation. These results will be called to the ordering clinician or representative by the Radiologist Assistant, and communication documented in the PACS or zVision Dashboard. Coronary artery calcifications are noted suggesting  Coronary artery disease. Moderate size hiatal hernia is noted. Nonobstructive right renal calculus. Electronically Signed   By: Marijo Conception, M.D.   On: 03/12/2016 13:56   Nm Pet Image Initial (pi) Skull Base To Thigh  Result Date: 03/20/2016 CLINICAL DATA:  Initial treatment strategy for lung lesion. EXAM: NUCLEAR MEDICINE PET SKULL BASE TO THIGH TECHNIQUE: 10.0 mCi F-18 FDG was injected intravenously. Full-ring PET imaging was performed from the skull base to thigh after the radiotracer. CT data was obtained and used for attenuation correction and anatomic localization. FASTING BLOOD GLUCOSE:  Value: 105 mg/dl COMPARISON:  Chest CT 03/12/2016. FINDINGS: NECK No hypermetabolic lymph nodes in the neck. CHEST Previously noted large pulmonary nodule in the medial aspect of the left lower lobe measures 2.9 x 2.6 cm and is hypermetabolic (SUVmax = 99991111). Several other smaller nodular densities also noted throughout the left lower lobe and lingula are also hypermetabolic. There are numerous extrapleural nodular densities throughout the left hemithorax, presumably extrapleural lymph nodes. These are diffusely hypermetabolic. Specific examples include a hypermetabolic (SUVmax = 0000000) 1 cm short axis lymph node (image 91 of series 4) in the posteromedial aspect of the left hemithorax (image 91 of series 4). There are numerous hypermetabolic mediastinal lymph nodes, the largest cluster of which is centered around the mid thoracic esophagus. Although these are difficult to discretely measure they are markedly hypermetabolic (SUVmax = 123456). Large 18 mm short axis juxta diaphragmatic lymph node noted in the lower middle mediastinum (image 86 of series 4) is hypermetabolic (SUVmax = 99991111). Superior mediastinal lymph nodes measuring up to 11 mm in short axis immediately anterior to the left common carotid artery origin (image 56 of series 4) are hypermetabolic (SUVmax = 8.8). Right supraclavicular and subpectoral  lymphadenopathy is also noted. Nonenlarged but hypermetabolic left internal mammary lymph nodes are also noted (SUVmax = 8.0). There is aortic atherosclerosis, as well as atherosclerosis of the great vessels of the mediastinum and the coronary arteries, including calcified atherosclerotic plaque in the left main, left anterior descending, left circumflex and right coronary arteries. Calcifications of the aortic valve. Moderate-sized hiatal hernia. Multiple small hypermetabolic retrocrural lymph nodes. ABDOMEN/PELVIS No abnormal hypermetabolic activity within the liver, pancreas, adrenal glands, or spleen. Extensive retroperitoneal hypermetabolic lymphadenopathy, largest of which is a left periaortic node measuring 1.7 cm in short axis (image 126 of series 4) which is hypermetabolic (SUVmax = [id number]). 4 mm nonobstructive calculus in the upper pole collecting system of the right kidney. No ureteral stones or findings of urinary tract obstruction are noted at this time. No pathologic dilatation of small bowel or colon. Numerous colonic diverticulae are noted, without definite surrounding inflammatory changes to suggest an acute diverticulitis at this time. There is a focal area of colonic wall thickening involving the mid sigmoid colon (image 160 of series 4) which demonstrates some low-level hypermetabolic activity (SUVmax = 6.2). SKELETON No focal hypermetabolic activity to suggest skeletal metastasis. IMPRESSION: 1. Previously noted pulmonary nodules are hypermetabolic, with the largest of these in the medial left lower lobe measuring 2.9 x 2.6 cm. This largest nodule in particular is suspicious for a primary bronchogenic neoplasm. The other smaller nodules could be infectious/inflammatory related to postobstructive changes, or could  represent metastatic lesions. At this time, there is widespread metastatic lymphadenopathy throughout the mediastinum, extrapleural lymph nodes throughout the left hemithorax, right  supraclavicular region, left internal mammary lymph nodes, retrocrural lymph nodes, and retroperitoneal lymph nodes, as detailed above. 2. Colonic diverticulosis. In the mid sigmoid colon there is a focal area of mural thickening which demonstrates some low-level hypermetabolic activity. This could be related to chronic inflammatory changes from underlying diverticular disease, however, correlation with nonemergent colonoscopy is recommended in the near future to exclude a primary colonic neoplasm. 3. Aortic atherosclerosis, in addition to left main and 3 vessel coronary artery disease. Please note that although the presence of coronary artery calcium documents the presence of coronary artery disease, the severity of this disease and any potential stenosis cannot be assessed on this non-gated CT examination. Assessment for potential risk factor modification, dietary therapy or pharmacologic therapy may be warranted, if clinically indicated. 4. There are calcifications of the aortic valve. Echocardiographic correlation for evaluation of potential valvular dysfunction may be warranted if clinically indicated. 5. Moderate-sized hiatal hernia. 6. Additional incidental findings, as above. Electronically Signed   By: Vinnie Langton M.D.   On: 03/20/2016 15:35    I have independently reviewed the above radiologic studies.  Recent Lab Findings: Lab Results  Component Value Date   WBC 6.4 03/27/2016   HGB 14.6 03/27/2016   HCT 44.4 03/27/2016   PLT 189 03/27/2016   GLUCOSE 115 (H) 02/09/2014   ALT 15 02/09/2014   AST 21 02/09/2014   NA 141 02/09/2014   K 4.4 02/09/2014   CL 101 02/09/2014   CREATININE 1.01 02/09/2014   BUN 19 02/09/2014   CO2 24 02/09/2014   INR 1.32 03/27/2016      Assessment / Plan:    1/ 2.5 cm mass is seen medially in left lower lobe inferior to hilum. Mediastinal adenopathy is noted concerning for metastatic disease. Multiple nodules are noted in the left upper and lower  lobes concerning for metastatic disease.- Potentially could represent stage IV lung cancer, or alternatively lymphoma involving the abdomen and chest. Discuss the findings and possible diagnoses with the patient in detail and reviewed the scans with him. Options of bronchoscopy with ebus versus needle biopsy of one of the peripherally based hypermetabolic left lung lesions specifically the lesion on image 86 posteriorly on the left were discussed with the patient.   We proceeded with a request to radiology for CT-guided needle biopsy of the peripherally based left lung lesion, at the time a ultrasound-guided needle biopsy of a small right supraclavicular lymph node was done- unfortunately no diagnosis was made and were now confronted with the need for more tissue.  I discussed with patient proceeding tomorrow with bronchoscopy ebus and ENB possible mediastinoscopy to obtain a tissue diagnosis Risks  and options were discussed with him in detail.  The goals risks and alternatives of the planned surgical procedure Bronchoscopy, endobronchial ultrasound with biopsy, Navagation bronchoscopy, possible mediastinoscopy have been discussed with the patient in detail. The risks of the procedure including death, infection, stroke, myocardial infarction, bleeding, blood transfusion have all been discussed specifically.  I have quoted Seth Gilbert a 1 % of perioperative mortality and a complication rate as high as 10 %. The patient's questions have been answered.Seth Gilbert is willing  to proceed with the planned procedure.      2/Colonic diverticulosis 3/Aortic atherosclerosis, in addition to left main and 3 vessel coronary artery clacification- asymptomatic 4/Moderate-sized hiatal hernia . Percell Miller  Maryruth Bun MD      [address] [place] [address] Office [phone]   Beeper [phone]  03/30/2016 8:39 AM

## 2016-03-30 NOTE — Anesthesia Procedure Notes (Signed)
Procedure Name: Intubation Date/Time: 03/30/2016 11:06 AM Performed by: Willeen Cass P Pre-anesthesia Checklist: Patient identified, Emergency Drugs available, Suction available, Patient being monitored and Timeout performed Patient Re-evaluated:Patient Re-evaluated prior to inductionOxygen Delivery Method: Circle system utilized Preoxygenation: Pre-oxygenation with 100% oxygen Intubation Type: IV induction Ventilation: Mask ventilation without difficulty and Oral airway inserted - appropriate to patient size Laryngoscope Size: Mac and 4 Grade View: Grade I Tube size: 9.0 mm Number of attempts: 1 Airway Equipment and Method: Stylet Placement Confirmation: ETT inserted through vocal cords under direct vision,  positive ETCO2 and breath sounds checked- equal and bilateral Secured at: 23 cm Tube secured with: Tape Dental Injury: Teeth and Oropharynx as per pre-operative assessment

## 2016-03-30 NOTE — Transfer of Care (Signed)
Immediate Anesthesia Transfer of Care Note  Patient: Seth Gilbert  Procedure(s) Performed: Procedure(s): VIDEO BRONCHOSCOPY WITH ENDOBRONCHIAL ULTRASOUND,WITH TRANSBRONCHIAL BIOPSY (N/A)  Patient Location: PACU  Anesthesia Type:General  Level of Consciousness: awake, alert , oriented and patient cooperative  Airway & Oxygen Therapy: Patient Spontanous Breathing and Patient connected to nasal cannula oxygen  Post-op Assessment: Report given to RN, Post -op Vital signs reviewed and stable and Patient moving all extremities  Post vital signs: Reviewed and stable  Last Vitals:  Vitals:   03/30/16 0840  BP: (!) 112/56  Temp: 36.7 C    Last Pain:  Vitals:   03/30/16 0840  TempSrc: Oral      Patients Stated Pain Goal: 3 ([id number])  Complications: No apparent anesthesia complications

## 2016-04-02 ENCOUNTER — Encounter (HOSPITAL_COMMUNITY): Payer: Self-pay | Admitting: Cardiothoracic Surgery

## 2016-04-03 ENCOUNTER — Telehealth: Payer: Self-pay | Admitting: Cardiothoracic Surgery

## 2016-04-03 ENCOUNTER — Encounter: Payer: Self-pay | Admitting: *Deleted

## 2016-04-03 ENCOUNTER — Other Ambulatory Visit: Payer: Self-pay

## 2016-04-03 DIAGNOSIS — R918 Other nonspecific abnormal finding of lung field: Secondary | ICD-10-CM

## 2016-04-03 DIAGNOSIS — C799 Secondary malignant neoplasm of unspecified site: Secondary | ICD-10-CM

## 2016-04-03 NOTE — Op Note (Signed)
NAMESRIKAR, CONSOLO NO.:  [id number]  MEDICAL RECORD NO.:  [id number]  LOCATION:  [facility]                         FACILITY:  [facility]  PHYSICIAN:  Lanelle Bal, MD    DATE OF BIRTH:  04-Feb-1936  DATE OF PROCEDURE:  03/30/2016 DATE OF DISCHARGE:  03/30/2016                              OPERATIVE REPORT   PREOPERATIVE DIAGNOSIS:  Multiple lung masses, and mediastinal and abdominal adenopathy.  POSTOPERATIVE DIAGNOSIS:  Multiple lung masses, and mediastinal and abdominal adenopathy with quick stain evidence of  Malignancy final path pending .  PROCEDURE PERFORMED:  Bronchoscopy, endoscopic bronchial ultrasound with transbronchial biopsies.  SURGEON:  Lanelle Bal, MD  BRIEF HISTORY:  The patient is a 80 year old male, with no previous history of smoking, who presented with left pleuritic pain and cough. Evaluation with CT scan and PET scan was consistent with significant mediastinal adenopathy, left hilar lung mass, and abdominal adenopathy. The patient was referred to Interventional Radiology to biopsy a low left chest wall mass under CT guidance instead an aspiration of the right scalene lymph node was performed, it was nondiagnostic.  With the patient's extensive disease, this was felt to be false negative.  We recommended the patient.  We will proceed with further invasive testing and bronchoscopy with endobronchial ultrasound and possible mediastinoscopy was recommended to the patient.  He agreed and signed informed consent.  DESCRIPTION OF PROCEDURE:  The patient underwent general endotracheal anesthesia without incident.  A single-lumen endotracheal tube was placed.  Appropriate time-out was performed.  We then proceeded with release of visual bronchoscopy to the subsegmental level both in the right and left tracheobronchial tree without evidence of endobronchial lesions.  We then removed this visual scope and proceeded with EBUS scope and initially  were able to easily identify #7 lymph nodes with ultrasound.  Several passes with transbronchial needle biopsy were performed.  The initial quick stain on this tissue was negative for malignancy.  The remainder of the tissue was submitted in satellite.  We then scoped out into the left tracheobronchial tree at the bifurcation of the upper and lower lobe bronchus.  A large nodal area was identified.  Additional biopsies and transbronchial biopsies were obtained at this level.  The initial smear that showed malignancy, multiple passes in this area were obtained.  Majority of the tissue was submitted for cell block, but also submitted in saline for possible flow cytometry as needed.  The patient tolerated the procedure without complication.  The scopes were removed.  The visual scope was then passed back to tracheobronchial tree with cleared of all secretions.  There was minimal bleeding.  The patient tolerated the procedure without obvious complication.  He was extubated in the operating room and transferred to the recovery room for further postoperative care.  Final path is pending.     Lanelle Bal, MD     EG/MEDQ  D:  04/03/2016  T:  04/03/2016  Job:  [id number]

## 2016-04-03 NOTE — Progress Notes (Signed)
Oncology Nurse Navigator Documentation  Oncology Nurse Navigator Flowsheets 04/03/2016  Navigator Encounter Type Telephone  Telephone Outgoing Call/I received referral from Dr. Servando Snare.  I called to schedule.  He will be seen on 04/05/16 arrive at 2:45 and see Dr. Julien Nordmann at 3:00.  He verbalized understanding of appt time and place.   Treatment Phase Pre-Tx/Tx Discussion  Barriers/Navigation Needs Coordination of Care  Interventions Coordination of Care  Coordination of Care Appts  Acuity Level 2  Acuity Level 2 Assistance expediting appointments  Time Spent with Patient 30

## 2016-04-03 NOTE — Telephone Encounter (Signed)
Called patient to review with him the path results and referral to Dr. Curt Bears.

## 2016-04-04 ENCOUNTER — Ambulatory Visit (HOSPITAL_COMMUNITY)
Admission: RE | Admit: 2016-04-04 | Discharge: 2016-04-04 | Disposition: A | Discharge status: Home / Self Care | Payer: Medicare Other | Source: Ambulatory Visit | Attending: Cardiothoracic Surgery | Admitting: Cardiothoracic Surgery

## 2016-04-04 DIAGNOSIS — C799 Secondary malignant neoplasm of unspecified site: Secondary | ICD-10-CM

## 2016-04-04 DIAGNOSIS — R918 Other nonspecific abnormal finding of lung field: Secondary | ICD-10-CM | POA: Diagnosis not present

## 2016-04-04 DIAGNOSIS — C801 Malignant (primary) neoplasm, unspecified: Secondary | ICD-10-CM | POA: Insufficient documentation

## 2016-04-04 DIAGNOSIS — G3189 Other specified degenerative diseases of nervous system: Secondary | ICD-10-CM | POA: Insufficient documentation

## 2016-04-04 DIAGNOSIS — I6782 Cerebral ischemia: Secondary | ICD-10-CM | POA: Diagnosis not present

## 2016-04-04 MED ORDER — GADOBENATE DIMEGLUMINE 529 MG/ML IV SOLN
20.0000 mL | Freq: Once | INTRAVENOUS | Status: AC | PRN
  Administered 2016-04-04: 19 mL via INTRAVENOUS

## 2016-04-05 ENCOUNTER — Encounter: Payer: Self-pay | Admitting: Internal Medicine

## 2016-04-05 ENCOUNTER — Ambulatory Visit (HOSPITAL_BASED_OUTPATIENT_CLINIC_OR_DEPARTMENT_OTHER): Payer: Medicare Other | Admitting: Internal Medicine

## 2016-04-05 DIAGNOSIS — C8338 Diffuse large B-cell lymphoma, lymph nodes of multiple sites: Secondary | ICD-10-CM | POA: Insufficient documentation

## 2016-04-05 DIAGNOSIS — C8590 Non-Hodgkin lymphoma, unspecified, unspecified site: Secondary | ICD-10-CM | POA: Insufficient documentation

## 2016-04-05 NOTE — Progress Notes (Signed)
[facility] Telephone:([phone]) [phone]   [phone]  CONSULT NOTE  REFERRING PHYSICIAN: Dr. Lanelle Gilbert  REASON FOR CONSULTATION:  80 years old white male recently diagnosed with large B-cell non-Hodgkin lymphoma  HPI Seth Gilbert is a 80 y.o. male with past medical history significant for diverticulitis, GERD, hiatal hernia, kidney stone, and rosacea. The patient also has a family history of non-Hodgkin lymphoma in his brother. He mentions that in mid July 2017 he start waking up in the morning with sharp pain on the left side of the chest. It used to get better after burping. It persisted for few weeks and the patient was seen by his primary care physician Dr. Harrington Gilbert. Chest x-ray was performed and showed questionable mass in the left lower lobe. This was followed by CT scan of the chest without contrast on 03/12/2016 and it showed Mediastinal adenopathy is noted with subcarinal adenopathy measuring 4.5 x 2.1 cm. Precarinal nodule measuring 17 x 13 mm is noted. Right peritracheal lymph node measuring 13 x 9 mm is noted. 28 x 8 mm note is noted in aortopulmonary window. Lungs/Pleura: 2.5 x 2.4 cm mass is seen in left lower lobe inferior to left hilum. 9 mm nodule is noted in superior segment of left lower lobe. Multiple irregular densities are noted inferiorly in lingular segment, with the largest measuring 18 x 14 mm. Multiple other nodules are noted in the left lower lobe. A PET scan was performed on 03/20/2016 and it showed Previously noted pulmonary nodules are hypermetabolic, with the largest of these in the medial left lower lobe measuring 2.9 x 2.6 cm. This largest nodule in particular is suspicious for a primary bronchogenic neoplasm. The other smaller nodules could be infectious/inflammatory related to postobstructive changes, Gilbert could represent metastatic lesions. At this time, there is widespread metastatic lymphadenopathy throughout the mediastinum,  extrapleural lymph nodes throughout the left hemithorax, right supraclavicular region, left internal mammary lymph nodes, retrocrural lymph nodes, and retroperitoneal lymph nodes. On 03/27/2016 he underwent ultrasound-guided core biopsy of right supraclavicular lymph node by interventional radiology. The final cytology was consistent with benign lymph node tissue. The patient was seen by Seth [facility] and on 03/30/2016 he underwent bronchoscopy, endobronchial ultrasound with transbronchial biopsies. The final cytology (Accession: [id number]) showed atypical lymphoid population present. The flow cytometry was consistent with monoclonal B-cell population. The findings are consistent with non-Hodgkin B-cell lymphoma. Seth Gilbert kindly referred the patient to me today for evaluation and recommendation regarding treatment of his condition. When seen today the patient is feeling fine with no specific complaints. The pain on the left side of the chest had improved. He denied having any shortness of breath but continues to have occasional cough. He has no hemoptysis. He denied having any significant weight loss Gilbert night sweats. He has no nausea, vomiting, diarrhea Gilbert constipation. Family history significant for mother with COPD, father died from heart disease and brother was diagnosed with non-Hodgkin lymphoma few years ago. The patient is married and has 1 daughter, Seth Gilbert and adopted son Seth Gilbert. He was accompanied today by his wife Seth [facility]. He is a retired Catering manager. He has no history of smoking but drinks alcohol occasionally and no history of drug abuse.  HPI  Past Medical History:  Diagnosis Date  . Diverticulitis   . GERD (gastroesophageal reflux disease)    Heartburn at times  . Heart murmur    "comes and goes" onset 4th grade - last time anyone heard it was 01/2014  .  History of hiatal hernia   . HOH (hard of hearing)   . Kidney stones    last time late 1990's  . PONV (postoperative nausea and  vomiting)   . Rosacea   . Wears glasses     Past Surgical History:  Procedure Laterality Date  . APPENDECTOMY    . COLONOSCOPY    . HEMORROIDECTOMY     inflammed anal gland removed  . INGUINAL HERNIA REPAIR Left 02/11/2014   Procedure: LEFT INGUINAL HERNIA REPAIR WITH MESH ;  Surgeon: Seth Faster. Cornett, MD;  Location: [place];  Service: General;  Laterality: Left;  . INSERTION OF MESH N/A 02/11/2014   Procedure: INSERTION OF MESH;  Surgeon: Seth Faster. Cornett, MD;  Location: [place] [facility];  Service: General;  Laterality: N/A;  . TONSILLECTOMY    . VIDEO BRONCHOSCOPY WITH ENDOBRONCHIAL ULTRASOUND N/A 03/30/2016   Procedure: VIDEO BRONCHOSCOPY WITH ENDOBRONCHIAL ULTRASOUND,WITH TRANSBRONCHIAL BIOPSY;  Surgeon: Seth Isaac, MD;  Location: [facility];  Service: Thoracic;  Laterality: N/A;    Family History  Problem Relation Age of Onset  . COPD Mother   . Heart disease Father     Social History Social History  Substance Use Topics  . Smoking status: Never Smoker  . Smokeless tobacco: Never Used  . Alcohol use Yes     Comment: occasional    No Known Allergies  Current Outpatient Prescriptions  Medication Sig Dispense Refill  . ampicillin (PRINCIPEN) 500 MG capsule Take 500 mg by mouth daily.     . famotidine (PEPCID) 40 MG tablet Take 40 mg by mouth daily as needed for heartburn.     . Glucosamine-Chondroit-Vit C-Mn (GLUCOSAMINE 1500 COMPLEX) CAPS Take 1 capsule by mouth 3 (three) times daily.     . metroNIDAZOLE (METROGEL) 0.75 % gel Apply 1 application topically daily.     . Multiple Vitamins-Minerals (CENTRUM SILVER PO) Take 1 tablet by mouth daily.     Marland Kitchen senna (SENOKOT) 8.6 MG tablet Take 1 tablet by mouth daily as needed for constipation.    . vitamin E 400 UNIT capsule Take 400 Units by mouth daily.     No current facility-administered medications for this visit.     Review of Systems  Constitutional: negative Eyes: negative Ears, nose,  mouth, throat, and face: negative Respiratory: positive for cough Cardiovascular: negative Gastrointestinal: negative Genitourinary:negative Integument/breast: negative Hematologic/lymphatic: negative Musculoskeletal:negative Neurological: negative Behavioral/Psych: negative Endocrine: negative Allergic/Immunologic: negative  Physical Exam  QZR:AQTMA, healthy, no distress, well nourished, well developed and anxious SKIN: skin color, texture, turgor are normal, no rashes Gilbert significant lesions HEAD: Normocephalic, No masses, lesions, tenderness Gilbert abnormalities EYES: normal, PERRLA EARS: External ears normal OROPHARYNX:no exudate and no erythema  NECK: supple, no adenopathy, no JVD LYMPH:  no palpable lymphadenopathy, no hepatosplenomegaly LUNGS: clear to auscultation , and palpation HEART: regular rate & rhythm and no gallops ABDOMEN:abdomen soft, non-tender, normal bowel sounds and no masses Gilbert organomegaly BACK: Back symmetric, no curvature., No CVA tenderness EXTREMITIES:no joint deformities, effusion, Gilbert inflammation, no edema, no skin discoloration  NEURO: alert & oriented x 3 with fluent speech, no focal motor/sensory deficits  PERFORMANCE STATUS: ECOG 1  LABORATORY DATA: Lab Results  Component Value Date   WBC 6.4 03/27/2016   HGB 14.6 03/27/2016   HCT 44.4 03/27/2016   MCV 98.0 03/27/2016   PLT 189 03/27/2016      Chemistry      Component Value Date/Time   NA 139 03/30/2016 0856  K 4.7 03/30/2016 0856   CL 107 03/30/2016 0856   CO2 27 03/30/2016 0856   BUN 13 03/30/2016 0856   CREATININE 1.13 03/30/2016 0856      Component Value Date/Time   CALCIUM 9.2 03/30/2016 0856   ALKPHOS 61 03/30/2016 0856   AST 21 03/30/2016 0856   ALT 14 (L) 03/30/2016 0856   BILITOT 0.7 03/30/2016 0856       RADIOGRAPHIC STUDIES: Dg Chest 2 View  Result Date: 03/30/2016 CLINICAL DATA:  Preoperative lung biopsy EXAM: CHEST  2 VIEW COMPARISON:  None. FINDINGS: There is  mild scarring in the left base region. The lungs elsewhere are clear. Heart size and pulmonary vascularity are normal. No adenopathy. There is atherosclerotic calcification in the aortic arch. There is degenerative change in the thoracic spine. IMPRESSION: Mild scarring left base. No edema Gilbert consolidation. Aortic atherosclerosis. No adenopathy evident. Electronically Signed   By: Lowella Grip III M.D.   On: 03/30/2016 09:23   Ct Chest Wo Contrast  Result Date: 03/12/2016 CLINICAL DATA:  Abnormal chest x-ray in left lower lobe. EXAM: CT CHEST WITHOUT CONTRAST TECHNIQUE: Multidetector CT imaging of the chest was performed following the standard protocol without IV contrast. COMPARISON:  Radiograph of March 08, 2016. FINDINGS: Cardiovascular: Coronary artery calcifications are noted. Cardiac size is within normal limits. Mediastinum/Nodes: Mediastinal adenopathy is noted with subcarinal adenopathy measuring 4.5 x 2.1 cm. Precarinal nodule measuring 17 x 13 mm is noted. Right peritracheal lymph node measuring 13 x 9 mm is noted. 28 x 8 mm note is noted in aortopulmonary window. Lungs/Pleura: 2.5 x 2.4 cm mass is seen in left lower lobe inferior to left hilum. 9 mm nodule is noted in superior segment of left lower lobe. Multiple irregular densities are noted inferiorly in lingular segment, with the largest measuring 18 x 14 mm. Multiple other nodules are noted in the left lower lobe. Upper Abdomen: Moderate size hiatal hernia is noted. Nonobstructive right renal calculus is noted. Musculoskeletal: Multilevel degenerative changes are noted in the thoracic spine. IMPRESSION: 2.5 cm mass is seen medially in left lower lobe inferior to hilum. Mediastinal adenopathy is noted concerning for metastatic disease. Multiple nodules are noted in the left upper and lower lobes concerning for metastatic disease. PET scan is recommended for further evaluation. These results will be called to the ordering clinician Gilbert  representative by the Radiologist Assistant, and communication documented in the PACS Gilbert zVision Dashboard. Coronary artery calcifications are noted suggesting Coronary artery disease. Moderate size hiatal hernia is noted. Nonobstructive right renal calculus. Electronically Signed   By: Marijo Conception, M.D.   On: 03/12/2016 13:56   Mr Jeri Cos ZO Contrast  Result Date: 04/04/2016 CLINICAL DATA:  Initial evaluation for metastatic disease. History of newly diagnosed lung mass. EXAM: MRI HEAD WITHOUT AND WITH CONTRAST TECHNIQUE: Multiplanar, multiecho pulse sequences of the brain and surrounding structures were obtained without and with intravenous contrast. CONTRAST:  76m MULTIHANCE GADOBENATE DIMEGLUMINE 529 MG/ML IV SOLN COMPARISON:  Prior chest CT from 03/12/2016 as well as prior PET-CT from 03/20/2016. FINDINGS: Age appropriate cerebral volume loss present. Patchy and confluent T2/FLAIR hyperintensity within the periventricular and deep white matter both cerebral hemispheres present, nonspecific, but most likely related to chronic small vessel ischemic changes, mild for patient age. No abnormal foci of restricted diffusion to suggest acute ischemia. Gray-white matter differentiation well maintained. Major intracranial vascular flow voids are preserved. No acute Gilbert chronic intracranial hemorrhage. No areas of chronic infarction. No mass  lesion, midline shift, Gilbert mass effect. No hydrocephalus. No extra-axial fluid collection. Major dural sinuses are patent. No abnormal enhancement. No evidence for intracranial metastatic disease. Craniocervical junction within normal limits. Mild degenerative spondylolysis noted at C2-3 without significant stenosis. Remainder the visualized upper cervical spine is unremarkable. Pituitary gland within normal limits. No acute abnormality about the globes and orbits. Mild scattered mucosal thickening within the ethmoid air cells. Paranasal sinuses are otherwise clear. Small left  mastoid effusion noted, of doubtful significance. Inner ear structures within normal limits. Bone marrow signal intensity within normal limits. No focal osseous lesions appreciated. No scalp soft tissue abnormality. IMPRESSION: 1. Negative MRI for intracranial metastatic disease Gilbert other mass lesion. No other acute intracranial process. 2. Age appropriate cerebral atrophy with mild chronic small vessel ischemic disease. Electronically Signed   By: Jeannine Boga M.D.   On: 04/04/2016 20:30   Nm Pet Image Initial (pi) Skull Base To Thigh  Result Date: 03/20/2016 CLINICAL DATA:  Initial treatment strategy for lung lesion. EXAM: NUCLEAR MEDICINE PET SKULL BASE TO THIGH TECHNIQUE: 10.0 mCi F-18 FDG was injected intravenously. Full-ring PET imaging was performed from the skull base to thigh after the radiotracer. CT data was obtained and used for attenuation correction and anatomic localization. FASTING BLOOD GLUCOSE:  Value: 105 mg/dl COMPARISON:  Chest CT 03/12/2016. FINDINGS: NECK No hypermetabolic lymph nodes in the neck. CHEST Previously noted large pulmonary nodule in the medial aspect of the left lower lobe measures 2.9 x 2.6 cm and is hypermetabolic (SUVmax = 67.8). Several other smaller nodular densities also noted throughout the left lower lobe and lingula are also hypermetabolic. There are numerous extrapleural nodular densities throughout the left hemithorax, presumably extrapleural lymph nodes. These are diffusely hypermetabolic. Specific examples include a hypermetabolic (SUVmax = 93.8) 1 cm short axis lymph node (image 91 of series 4) in the posteromedial aspect of the left hemithorax (image 91 of series 4). There are numerous hypermetabolic mediastinal lymph nodes, the largest cluster of which is centered around the mid thoracic esophagus. Although these are difficult to discretely measure they are markedly hypermetabolic (SUVmax = 10.1). Large 18 mm short axis juxta diaphragmatic lymph node  noted in the lower middle mediastinum (image 86 of series 4) is hypermetabolic (SUVmax = 75.1). Superior mediastinal lymph nodes measuring up to 11 mm in short axis immediately anterior to the left common carotid artery origin (image 56 of series 4) are hypermetabolic (SUVmax = 8.8). Right supraclavicular and subpectoral lymphadenopathy is also noted. Nonenlarged but hypermetabolic left internal mammary lymph nodes are also noted (SUVmax = 8.0). There is aortic atherosclerosis, as well as atherosclerosis of the great vessels of the mediastinum and the coronary arteries, including calcified atherosclerotic plaque in the left main, left anterior descending, left circumflex and right coronary arteries. Calcifications of the aortic valve. Moderate-sized hiatal hernia. Multiple small hypermetabolic retrocrural lymph nodes. ABDOMEN/PELVIS No abnormal hypermetabolic activity within the liver, pancreas, adrenal glands, Gilbert spleen. Extensive retroperitoneal hypermetabolic lymphadenopathy, largest of which is a left periaortic node measuring 1.7 cm in short axis (image 126 of series 4) which is hypermetabolic (SUVmax = 02.5). 4 mm nonobstructive calculus in the upper pole collecting system of the right kidney. No ureteral stones Gilbert findings of urinary tract obstruction are noted at this time. No pathologic dilatation of small bowel Gilbert colon. Numerous colonic diverticulae are noted, without definite surrounding inflammatory changes to suggest an acute diverticulitis at this time. There is a focal area of colonic wall thickening involving the mid  sigmoid colon (image 160 of series 4) which demonstrates some low-level hypermetabolic activity (SUVmax = 6.2). SKELETON No focal hypermetabolic activity to suggest skeletal metastasis. IMPRESSION: 1. Previously noted pulmonary nodules are hypermetabolic, with the largest of these in the medial left lower lobe measuring 2.9 x 2.6 cm. This largest nodule in particular is suspicious for a  primary bronchogenic neoplasm. The other smaller nodules could be infectious/inflammatory related to postobstructive changes, Gilbert could represent metastatic lesions. At this time, there is widespread metastatic lymphadenopathy throughout the mediastinum, extrapleural lymph nodes throughout the left hemithorax, right supraclavicular region, left internal mammary lymph nodes, retrocrural lymph nodes, and retroperitoneal lymph nodes, as detailed above. 2. Colonic diverticulosis. In the mid sigmoid colon there is a focal area of mural thickening which demonstrates some low-level hypermetabolic activity. This could be related to chronic inflammatory changes from underlying diverticular disease, however, correlation with nonemergent colonoscopy is recommended in the near future to exclude a primary colonic neoplasm. 3. Aortic atherosclerosis, in addition to left main and 3 vessel coronary artery disease. Please note that although the presence of coronary artery calcium documents the presence of coronary artery disease, the severity of this disease and any potential stenosis cannot be assessed on this non-gated CT examination. Assessment for potential risk factor modification, dietary therapy Gilbert pharmacologic therapy may be warranted, if clinically indicated. 4. There are calcifications of the aortic valve. Echocardiographic correlation for evaluation of potential valvular dysfunction may be warranted if clinically indicated. 5. Moderate-sized hiatal hernia. 6. Additional incidental findings, as above. Electronically Signed   By: Vinnie Langton M.D.   On: 03/20/2016 15:35  US Biopsy  Result Date: 03/27/2016 INDICATION: 80 year old male with a imaging diagnosis of metastatic disease. FDG positive supraclavicular adenopathy is reasonable site for biopsy. EXAM: ULTRASOUND BIOPSY CORE LIVER MEDICATIONS: None. ANESTHESIA/SEDATION: Moderate (conscious) sedation was employed during this procedure. A total of Versed 2.0 mg and  Fentanyl 75 mcg was administered intravenously. Moderate Sedation Time: 20 minutes. The patient's level of consciousness and vital signs were monitored continuously by radiology nursing throughout the procedure under my direct supervision. FLUOROSCOPY TIME:  None COMPLICATIONS: None PROCEDURE: Informed written consent was obtained from the patient after a thorough discussion of the procedural risks, benefits and alternatives. All questions were addressed. Maximal Sterile Barrier Technique was utilized including caps, mask, sterile gowns, sterile gloves, sterile drape, hand hygiene and skin antiseptic. A timeout was performed prior to the initiation of the procedure. Patient positioned supine position on the ultrasound stretcher. Images of the right supraclavicular region were stored and sent to PACs. Patient is prepped and draped in usual sterile fashion. The skin and subcutaneous tissues were generously infiltrated 1% lidocaine for local anesthesia. Small stab incision was made with 11 blade scalpel, and using ultrasound guidance, a 17 gauge biopsy gun was advanced into the supraclavicular lymph node of suspicious ultrasound characteristics. Multiple core biopsy were acquired. Tissue wound placed into formalin. Final image was stored. Patient tolerated the procedure well and remained hemodynamically stable throughout. No complications were encountered and no significant blood loss. IMPRESSION: Status post ultrasound-guided biopsy of right supraclavicular lymph node. Tissue specimen sent to pathology for complete histopathologic analysis. Signed, Dulcy Fanny. Earleen Newport, DO [facility] Electronically Signed   By: Corrie Mckusick D.O.   On: 03/27/2016 16:52    ASSESSMENT: This is a very pleasant 80 years old white male recently diagnosed with at least stage II/III large B-cell non-Hodgkin lymphoma diagnosed in August 2017, pending further staging  workup. The patient  has extensive lymphadenopathy involving the supraclavicular, mediastinal as well as retroperitoneal lymphadenopathy with suspicious left lower lobe lung nodule.   PLAN: I had a lengthy discussion with the patient and his wife today about his current disease and treatment options. I recommended for the patient to complete the staging workup by ordering a repeat core biopsy of the enlarged retroperitoneal lymph nodes for further lymphoma workup. I will also order a CT-guided bone marrow biopsy and aspirate to rule out bone marrow involvement. I discussed with the patient previously the treatment plan which most likely will be with systemic chemotherapy with CHOP/Rituxan every 3 weeks with Neulasta support. I discussed with the patient the adverse effect of this treatment including but not limited to alopecia, myelosuppression, nausea and vomiting, peripheral neuropathy, liver Gilbert renal dysfunction. I would also consider the patient for 2-D echo for evaluation of his cardiac function before starting the first dose of his treatment. I will also arrange for the patient to have repeat blood work including CBC, comprehensive metabolic panel, LDH, uric acid as well as hepatitis panel. I will also refer the patient to [facility] for consideration of Port-A-Cath placement for chemotherapy administration. I will see the patient back for follow-up visit in 7-10 days for reevaluation and more discussion of his treatment option. I will arrange for the patient to have a chemotherapy education class before the first dose of his treatment. I will also give him prescription for allopurinol, prednisone and Emla cream during his upcoming visit. The patient was advised to call immediately if he has any concerning symptoms in the interval. The patient voices understanding of current disease status and treatment options and is in agreement with the current care plan.  All questions were answered. The patient knows to call  the clinic with any problems, questions Gilbert concerns. We can certainly see the patient much sooner if necessary.  Thank you so much for allowing me to participate in the care of DAXTER PAULE. I will continue to follow up the patient with you and assist in his care.   Disclaimer: This note was dictated with voice recognition software. Similar sounding words can inadvertently be transcribed and may not be corrected upon review.   Ferol Laiche K. April 05, 2016, 4:24 PM

## 2016-04-06 ENCOUNTER — Other Ambulatory Visit: Payer: Self-pay | Admitting: *Deleted

## 2016-04-06 ENCOUNTER — Other Ambulatory Visit: Payer: Self-pay | Admitting: Medical Oncology

## 2016-04-06 DIAGNOSIS — C8190 Hodgkin lymphoma, unspecified, unspecified site: Secondary | ICD-10-CM

## 2016-04-09 ENCOUNTER — Encounter (HOSPITAL_COMMUNITY): Payer: Self-pay | Admitting: *Deleted

## 2016-04-09 ENCOUNTER — Telehealth: Payer: Self-pay | Admitting: *Deleted

## 2016-04-09 ENCOUNTER — Ambulatory Visit (HOSPITAL_COMMUNITY): Admission: RE | Admit: 2016-04-09 | Payer: Medicare Other | Source: Ambulatory Visit

## 2016-04-09 MED ORDER — DEXTROSE 5 % IV SOLN
1.5000 g | INTRAVENOUS | Status: AC
  Administered 2016-04-10: 1.5 g via INTRAVENOUS
  Filled 2016-04-09: qty 1.5

## 2016-04-09 NOTE — Telephone Encounter (Signed)
Oncology Nurse Navigator Documentation  Oncology Nurse Navigator Flowsheets 04/09/2016  Navigator Encounter Type Telephone  Telephone Outgoing Call  Treatment Phase Pre-Tx/Tx Discussion  Barriers/Navigation Needs Education  Education Other  Interventions Education Method/patient called and left me 2 vm message concerned about scheduling for upcoming appts.  I called him back to update.  I also gave him radiology appt to get a better clarification of time to be at dept for biopsy.    Education Method Verbal  Acuity Level 2  Acuity Level 2 Educational needs  Time Spent with Patient 30

## 2016-04-10 ENCOUNTER — Ambulatory Visit (HOSPITAL_COMMUNITY): Payer: Medicare Other

## 2016-04-10 ENCOUNTER — Ambulatory Visit (HOSPITAL_COMMUNITY): Payer: Medicare Other | Admitting: Anesthesiology

## 2016-04-10 ENCOUNTER — Ambulatory Visit (HOSPITAL_COMMUNITY)
Admission: RE | Admit: 2016-04-10 | Discharge: 2016-04-10 | Disposition: A | Discharge status: Home / Self Care | Payer: Medicare Other | Source: Ambulatory Visit | Attending: Cardiothoracic Surgery | Admitting: Cardiothoracic Surgery

## 2016-04-10 ENCOUNTER — Encounter (HOSPITAL_COMMUNITY): Payer: Self-pay | Admitting: *Deleted

## 2016-04-10 ENCOUNTER — Encounter (HOSPITAL_COMMUNITY)
Admission: RE | Disposition: A | Discharge status: Home / Self Care | Payer: Self-pay | Source: Ambulatory Visit | Attending: Cardiothoracic Surgery

## 2016-04-10 DIAGNOSIS — Z452 Encounter for adjustment and management of vascular access device: Secondary | ICD-10-CM | POA: Diagnosis not present

## 2016-04-10 DIAGNOSIS — Z79899 Other long term (current) drug therapy: Secondary | ICD-10-CM | POA: Insufficient documentation

## 2016-04-10 DIAGNOSIS — H9190 Unspecified hearing loss, unspecified ear: Secondary | ICD-10-CM | POA: Diagnosis not present

## 2016-04-10 DIAGNOSIS — C8190 Hodgkin lymphoma, unspecified, unspecified site: Secondary | ICD-10-CM

## 2016-04-10 DIAGNOSIS — Z792 Long term (current) use of antibiotics: Secondary | ICD-10-CM | POA: Insufficient documentation

## 2016-04-10 DIAGNOSIS — C8510 Unspecified B-cell lymphoma, unspecified site: Secondary | ICD-10-CM | POA: Insufficient documentation

## 2016-04-10 DIAGNOSIS — K219 Gastro-esophageal reflux disease without esophagitis: Secondary | ICD-10-CM | POA: Insufficient documentation

## 2016-04-10 DIAGNOSIS — C8338 Diffuse large B-cell lymphoma, lymph nodes of multiple sites: Secondary | ICD-10-CM | POA: Diagnosis not present

## 2016-04-10 DIAGNOSIS — Z09 Encounter for follow-up examination after completed treatment for conditions other than malignant neoplasm: Secondary | ICD-10-CM

## 2016-04-10 DIAGNOSIS — C8590 Non-Hodgkin lymphoma, unspecified, unspecified site: Secondary | ICD-10-CM | POA: Diagnosis not present

## 2016-04-10 DIAGNOSIS — Z419 Encounter for procedure for purposes other than remedying health state, unspecified: Secondary | ICD-10-CM

## 2016-04-10 DIAGNOSIS — Z95828 Presence of other vascular implants and grafts: Secondary | ICD-10-CM

## 2016-04-10 HISTORY — DX: Personal history of urinary calculi: Z87.442

## 2016-04-10 HISTORY — PX: PORTACATH PLACEMENT: SHX2246

## 2016-04-10 SURGERY — INSERTION, TUNNELED CENTRAL VENOUS DEVICE, WITH PORT
Anesthesia: General | Site: Chest | Laterality: Left

## 2016-04-10 MED ORDER — ONDANSETRON HCL 4 MG/2ML IJ SOLN
INTRAMUSCULAR | Status: AC
  Filled 2016-04-10: qty 6

## 2016-04-10 MED ORDER — HEPARIN SODIUM (PORCINE) 1000 UNIT/ML IJ SOLN
INTRAMUSCULAR | Status: AC
  Filled 2016-04-10: qty 1

## 2016-04-10 MED ORDER — FENTANYL CITRATE (PF) 250 MCG/5ML IJ SOLN
INTRAMUSCULAR | Status: AC
  Filled 2016-04-10: qty 5

## 2016-04-10 MED ORDER — EPHEDRINE SULFATE 50 MG/ML IJ SOLN
INTRAMUSCULAR | Status: DC | PRN
  Administered 2016-04-10 (×3): 10 mg via INTRAVENOUS

## 2016-04-10 MED ORDER — ARTIFICIAL TEARS OP OINT
TOPICAL_OINTMENT | OPHTHALMIC | Status: AC
  Filled 2016-04-10: qty 3.5

## 2016-04-10 MED ORDER — HEPARIN SOD (PORK) LOCK FLUSH 100 UNIT/ML IV SOLN
INTRAVENOUS | Status: AC
  Filled 2016-04-10: qty 5

## 2016-04-10 MED ORDER — SUGAMMADEX SODIUM 200 MG/2ML IV SOLN
INTRAVENOUS | Status: AC
  Filled 2016-04-10: qty 2

## 2016-04-10 MED ORDER — SUCCINYLCHOLINE CHLORIDE 200 MG/10ML IV SOSY
PREFILLED_SYRINGE | INTRAVENOUS | Status: AC
  Filled 2016-04-10: qty 10

## 2016-04-10 MED ORDER — PHENYLEPHRINE HCL 10 MG/ML IJ SOLN
INTRAMUSCULAR | Status: DC | PRN
  Administered 2016-04-10: 120 ug via INTRAVENOUS
  Administered 2016-04-10 (×2): 80 ug via INTRAVENOUS
  Administered 2016-04-10: 120 ug via INTRAVENOUS

## 2016-04-10 MED ORDER — FENTANYL CITRATE (PF) 100 MCG/2ML IJ SOLN
INTRAMUSCULAR | Status: DC | PRN
  Administered 2016-04-10 (×2): 50 ug via INTRAVENOUS

## 2016-04-10 MED ORDER — LIDOCAINE HCL (CARDIAC) 20 MG/ML IV SOLN
INTRAVENOUS | Status: DC | PRN
  Administered 2016-04-10: 40 mg via INTRAVENOUS

## 2016-04-10 MED ORDER — PROMETHAZINE HCL 25 MG/ML IJ SOLN: 6.2500 mg | INTRAMUSCULAR | Status: DC | PRN

## 2016-04-10 MED ORDER — SODIUM CHLORIDE 0.9 % IV SOLN
INTRAVENOUS | Status: DC | PRN
  Administered 2016-04-10: 2.5 mL

## 2016-04-10 MED ORDER — ONDANSETRON HCL 4 MG/2ML IJ SOLN
INTRAMUSCULAR | Status: DC | PRN
  Administered 2016-04-10: 4 mg via INTRAVENOUS

## 2016-04-10 MED ORDER — HYDROCODONE-ACETAMINOPHEN 5-325 MG PO TABS: 1.0000 | ORAL_TABLET | Freq: Four times a day (QID) | ORAL | 0 refills | Status: DC | PRN

## 2016-04-10 MED ORDER — SUGAMMADEX SODIUM 500 MG/5ML IV SOLN
INTRAVENOUS | Status: AC
  Filled 2016-04-10: qty 5

## 2016-04-10 MED ORDER — PROPOFOL 10 MG/ML IV BOLUS
INTRAVENOUS | Status: DC | PRN
  Administered 2016-04-10: 200 mg via INTRAVENOUS

## 2016-04-10 MED ORDER — LACTATED RINGERS IV SOLN
INTRAVENOUS | Status: DC | PRN
  Administered 2016-04-10 (×2): via INTRAVENOUS

## 2016-04-10 MED ORDER — LIDOCAINE HCL (PF) 1 % IJ SOLN
INTRAMUSCULAR | Status: DC | PRN
  Administered 2016-04-10: 10 mL

## 2016-04-10 MED ORDER — PHENYLEPHRINE 40 MCG/ML (10ML) SYRINGE FOR IV PUSH (FOR BLOOD PRESSURE SUPPORT)
PREFILLED_SYRINGE | INTRAVENOUS | Status: AC
  Filled 2016-04-10: qty 20

## 2016-04-10 MED ORDER — HEPARIN SOD (PORK) LOCK FLUSH 100 UNIT/ML IV SOLN
INTRAVENOUS | Status: DC | PRN
  Administered 2016-04-10: 500 [IU] via INTRAVENOUS

## 2016-04-10 MED ORDER — DEXAMETHASONE SODIUM PHOSPHATE 10 MG/ML IJ SOLN
INTRAMUSCULAR | Status: DC | PRN
Start: 2016-04-10 — End: 2016-04-10
  Administered 2016-04-10: 10 mg via INTRAVENOUS

## 2016-04-10 MED ORDER — EPHEDRINE SULFATE 50 MG/ML IJ SOLN
INTRAMUSCULAR | Status: AC
  Filled 2016-04-10: qty 1

## 2016-04-10 MED ORDER — SODIUM BICARBONATE 8.4 % IV SOLN
INTRAVENOUS | Status: AC
  Filled 2016-04-10: qty 50

## 2016-04-10 MED ORDER — PROPOFOL 10 MG/ML IV BOLUS
INTRAVENOUS | Status: AC
  Filled 2016-04-10: qty 20

## 2016-04-10 MED ORDER — DEXAMETHASONE SODIUM PHOSPHATE 10 MG/ML IJ SOLN
INTRAMUSCULAR | Status: AC
  Filled 2016-04-10: qty 3

## 2016-04-10 MED ORDER — 0.9 % SODIUM CHLORIDE (POUR BTL) OPTIME
TOPICAL | Status: DC | PRN
  Administered 2016-04-10: 1000 mL

## 2016-04-10 MED ORDER — LACTATED RINGERS IV SOLN
INTRAVENOUS | Status: DC
  Administered 2016-04-10: 10:00:00 via INTRAVENOUS

## 2016-04-10 MED ORDER — SODIUM CHLORIDE 0.9 % IJ SOLN
INTRAMUSCULAR | Status: AC
  Filled 2016-04-10: qty 10

## 2016-04-10 MED ORDER — ROCURONIUM BROMIDE 10 MG/ML (PF) SYRINGE
PREFILLED_SYRINGE | INTRAVENOUS | Status: AC
  Filled 2016-04-10: qty 20

## 2016-04-10 MED ORDER — HYDROMORPHONE HCL 1 MG/ML IJ SOLN: 0.2500 mg | INTRAMUSCULAR | Status: DC | PRN

## 2016-04-10 MED ORDER — LIDOCAINE HCL (PF) 1 % IJ SOLN
INTRAMUSCULAR | Status: AC
  Filled 2016-04-10: qty 30

## 2016-04-10 SURGICAL SUPPLY — 41 items
BAG DECANTER FOR FLEXI CONT (MISCELLANEOUS) ×2 IMPLANT
BLADE SURG 11 STRL SS (BLADE) ×2 IMPLANT
CANISTER SUCTION 2500CC (MISCELLANEOUS) ×2 IMPLANT
COVER PROBE W GEL 5X96 (DRAPES) ×2 IMPLANT
COVER SURGICAL LIGHT HANDLE (MISCELLANEOUS) ×2 IMPLANT
DERMABOND ADVANCED (GAUZE/BANDAGES/DRESSINGS) ×1
DERMABOND ADVANCED .7 DNX12 (GAUZE/BANDAGES/DRESSINGS) ×1 IMPLANT
DRAPE C-ARM 42X72 X-RAY (DRAPES) ×2 IMPLANT
DRAPE CHEST BREAST 15X10 FENES (DRAPES) ×2 IMPLANT
ELECT CAUTERY BLADE 6.4 (BLADE) ×2 IMPLANT
ELECT REM PT RETURN 9FT ADLT (ELECTROSURGICAL) ×2
ELECTRODE REM PT RTRN 9FT ADLT (ELECTROSURGICAL) ×1 IMPLANT
GAUZE SPONGE 4X4 12PLY STRL (GAUZE/BANDAGES/DRESSINGS) ×2 IMPLANT
GLOVE BIO SURGEON STRL SZ 6.5 (GLOVE) ×4 IMPLANT
GLOVE BIOGEL M 6.5 STRL (GLOVE) ×4 IMPLANT
GLOVE BIOGEL M STER SZ 6 (GLOVE) ×4 IMPLANT
GOWN STRL REUS W/ TWL LRG LVL3 (GOWN DISPOSABLE) ×4 IMPLANT
GOWN STRL REUS W/TWL LRG LVL3 (GOWN DISPOSABLE) ×4
GUIDEWIRE UNCOATED ST S 7038 (WIRE) IMPLANT
INTRODUCER 13FR (MISCELLANEOUS) IMPLANT
INTRODUCER COOK 11FR (CATHETERS) IMPLANT
KIT BASIN OR (CUSTOM PROCEDURE TRAY) ×2 IMPLANT
KIT PORT POWER 9.6FR MRI PREA (Catheter) ×2 IMPLANT
KIT ROOM TURNOVER OR (KITS) ×2 IMPLANT
NEEDLE 22X1 1/2 (OR ONLY) (NEEDLE) IMPLANT
NEEDLE HYPO 25GX1X1/2 BEV (NEEDLE) ×2 IMPLANT
NS IRRIG 1000ML POUR BTL (IV SOLUTION) ×2 IMPLANT
PACK GENERAL/GYN (CUSTOM PROCEDURE TRAY) ×2 IMPLANT
PAD ARMBOARD 7.5X6 YLW CONV (MISCELLANEOUS) ×4 IMPLANT
SET SHEATH INTRODUCER 10FR (MISCELLANEOUS) IMPLANT
SUT SILK 2 0 SH (SUTURE) ×2 IMPLANT
SUT VIC AB 3-0 SH 18 (SUTURE) ×2 IMPLANT
SUT VIC AB 3-0 SH 8-18 (SUTURE) ×2 IMPLANT
SUT VIC AB 3-0 X1 27 (SUTURE) ×2 IMPLANT
SUT VICRYL 4-0 PS2 18IN ABS (SUTURE) ×2 IMPLANT
SYR 20CC LL (SYRINGE) ×2 IMPLANT
SYR 5ML LUER SLIP (SYRINGE) IMPLANT
SYR CONTROL 10ML LL (SYRINGE) ×2 IMPLANT
TOWEL OR 17X24 6PK STRL BLUE (TOWEL DISPOSABLE) ×2 IMPLANT
TOWEL OR 17X26 10 PK STRL BLUE (TOWEL DISPOSABLE) ×2 IMPLANT
WATER STERILE IRR 1000ML POUR (IV SOLUTION) ×2 IMPLANT

## 2016-04-10 NOTE — Anesthesia Preprocedure Evaluation (Addendum)
Anesthesia Evaluation  Patient identified by MRN, date of birth, ID band Patient awake    Reviewed: NPO status , Patient's Chart, lab work & pertinent test results  History of Anesthesia Complications (+) PONV and history of anesthetic complications  Airway Mallampati: I  TM Distance: >3 FB Neck ROM: Full    Dental  (+) Teeth Intact, Dental Advisory Given, Caps   Pulmonary neg pulmonary ROS,    breath sounds clear to auscultation       Cardiovascular negative cardio ROS   Rhythm:Regular Rate:Normal     Neuro/Psych negative neurological ROS     GI/Hepatic Neg liver ROS, hiatal hernia, GERD  ,  Endo/Other  negative endocrine ROS  Renal/GU Renal disease     Musculoskeletal   Abdominal   Peds  Hematology negative hematology ROS (+)   Anesthesia Other Findings   Reproductive/Obstetrics                             Anesthesia Physical Anesthesia Plan  ASA: II  Anesthesia Plan: General   Post-op Pain Management:    Induction: Intravenous  Airway Management Planned: LMA  Additional Equipment:   Intra-op Plan:   Post-operative Plan: Extubation in OR  Informed Consent: I have reviewed the patients History and Physical, chart, labs and discussed the procedure including the risks, benefits and alternatives for the proposed anesthesia with the patient or authorized representative who has indicated his/her understanding and acceptance.   Dental advisory given  Plan Discussed with: CRNA  Anesthesia Plan Comments:         Anesthesia Quick Evaluation

## 2016-04-10 NOTE — Transfer of Care (Signed)
Immediate Anesthesia Transfer of Care Note  Patient: Seth Gilbert  Procedure(s) Performed: Procedure(s): INSERTION PORT-A-CATH into LEFT Subclavian Vein with guidance from ultrasound and flouroscopy (Left)  Patient Location: PACU  Anesthesia Type:General  Level of Consciousness: awake, alert  and oriented  Airway & Oxygen Therapy: Patient Spontanous Breathing and Patient connected to nasal cannula oxygen  Post-op Assessment: Report given to RN, Post -op Vital signs reviewed and stable and Patient moving all extremities X 4  Post vital signs: Reviewed and stable  Last Vitals:  Vitals:   04/10/16 1014  BP: 140/61  Pulse: 74  Resp: 18  Temp: 36.8 C    Last Pain:  Vitals:   04/10/16 1014  TempSrc: Oral      Patients Stated Pain Goal: 3 ([id number])  Complications: No apparent anesthesia complications

## 2016-04-10 NOTE — Anesthesia Procedure Notes (Signed)
Procedure Name: LMA Insertion Date/Time: 04/10/2016 11:58 AM Performed by: Neldon Newport Pre-anesthesia Checklist: Timeout performed, Patient being monitored, Suction available, Emergency Drugs available and Patient identified Patient Re-evaluated:Patient Re-evaluated prior to inductionOxygen Delivery Method: Circle system utilized Preoxygenation: Pre-oxygenation with 100% oxygen Intubation Type: IV induction Ventilation: Mask ventilation without difficulty LMA: LMA inserted LMA Size: 4.0 Number of attempts: 1 Placement Confirmation: breath sounds checked- equal and bilateral and positive ETCO2 Tube secured with: Tape Dental Injury: Teeth and Oropharynx as per pre-operative assessment

## 2016-04-10 NOTE — Brief Op Note (Signed)
      [address]       [place] [address]             [phone]      04/10/2016  1:03 PM  PATIENT:  Seth Gilbert  80 y.o. male  PRE-OPERATIVE DIAGNOSIS:  LYMPHOMA need for vascular access   POST-OPERATIVE DIAGNOSIS:  same  PROCEDURE:  Procedure(s): INSERTION PORT-A-CATH into LEFT Subclavian Vein with guidance from ultrasound and flouroscopy (Left)    SURGEON:  Surgeon(s) and Role:    * Grace Isaac, MD - Primary  ANESTHESIA:   general  EBL:  Total I/O In: 1100 [I.V.:1100] Out: 25 [Blood:25]  BLOOD ADMINISTERED:none  DRAINS: left porta cath plaqced    LOCAL MEDICATIONS USED:  NONE  SPECIMEN:  No Specimen  DISPOSITION OF SPECIMEN:  PATHOLOGY  COUNTS:  YES   DICTATION: .Dragon Dictation  PLAN OF CARE: Discharge to home after PACU  PATIENT DISPOSITION:  PACU - hemodynamically stable.   Delay start of Pharmacological VTE agent (>24hrs) due to surgical blood loss or risk of bleeding: yes

## 2016-04-10 NOTE — H&P (Signed)
[address]       , [address]             [phone]                    Seth Gilbert [place] Medical Record [id number] Date of Birth: 03-07-1936  Referring:Dr Harrington Challenger  Primary Care:  [name], MD  Chief Complaint:    Lung mass   History of Present Illness:    Seth Gilbert 80 y.o. male is seen in the office for Abnormal CT of the chest . The patient noted several weeks ago after doing yard work some left sided epigastric and left chest pain sometimes bothering during the night and Ritalin would be relieved by getting up. Because of the atypical pain sought medical attention chest x-ray was abnormal leading to a CT scan of the chest and a PET scan. The patient has no previous history of cardiac disease.  The patient denies fever chills or night sweats, his weight has been stable, he denies abdominal pain.  Patient has been a lifelong nonsmoker  A needle biopsy ultrasound-guided was done on the right supraclavicular node, unfortunately the pathology and this showed no diagnosis  bx of lung mass with flow cytometry non- Hodgkin's  lymphoma , to start chemotherapy soon   Current Activity/ Functional Status:  Patient is independent with mobility/ambulation, transfers, ADL's, IADL's.   Zubrod Score: At the time of surgery this patient's most appropriate activity status/level should be described as: [x]     0    Normal activity, no symptoms []     1    Restricted in physical strenuous activity but ambulatory, able to do out light work []     2    Ambulatory and capable of self care, unable to do work activities, up and about               >50 % of waking hours                              []     3    Only limited self care, in bed greater than 50% of waking hours []     4    Completely disabled, no self care, confined to bed or chair []     5    Moribund   Past Medical History:  Diagnosis Date  . Diverticulitis   . GERD (gastroesophageal reflux  disease)    Heartburn at times  . Heart murmur    "comes and goes" onset 4th grade - last time anyone heard it was 01/2014  . History of hiatal hernia   . History of kidney stones   . HOH (hard of hearing)   . PONV (postoperative nausea and vomiting)   . Rosacea   . Wears glasses     Past Surgical History:  Procedure Laterality Date  . APPENDECTOMY    . COLONOSCOPY    . HEMORROIDECTOMY     inflammed anal gland removed  . INGUINAL HERNIA REPAIR Left 02/11/2014   Procedure: LEFT INGUINAL HERNIA REPAIR WITH MESH ;  Surgeon: Joyice Faster. Cornett, MD;  Location: [facility];  Service: General;  Laterality: Left;  . INSERTION OF MESH N/A 02/11/2014   Procedure: INSERTION OF MESH;  Surgeon: Joyice Faster. Cornett, MD;  Location: [facility];  Service: General;  Laterality: N/A;  . TONSILLECTOMY    .  VIDEO BRONCHOSCOPY WITH ENDOBRONCHIAL ULTRASOUND N/A 03/30/2016   Procedure: VIDEO BRONCHOSCOPY WITH ENDOBRONCHIAL ULTRASOUND,WITH TRANSBRONCHIAL BIOPSY;  Surgeon: Grace Isaac, MD;  Location: [facility] OR;  Service: Thoracic;  Laterality: N/A;    Family History  Problem Relation Age of Onset  . COPD Mother   . Heart disease Father     Social History   Social History  . Marital status: Married    Spouse name: N/A  . Number of children: N/A  . Years of education: N/A   Occupational History  . Not on file.   Social History Main Topics  . Smoking status: Never Smoker  . Smokeless tobacco: Never Used  . Alcohol use Yes     Comment: occasional- a couple times a month  . Drug use: No  . Sexual activity: Not on file   Other Topics Concern  . Not on file   Social History Narrative  . No narrative on file    History  Smoking Status  . Never Smoker  Smokeless Tobacco  . Never Used    History  Alcohol Use  . Yes    Comment: occasional- a couple times a month     Allergies  Allergen Reactions  . No Known Allergies     Current Facility-Administered  Medications  Medication Dose Route Frequency Provider Last Rate Last Dose  . cefUROXime (ZINACEF) 1.5 g in dextrose 5 % 50 mL IVPB  1.5 g Intravenous To SS-Surg Grace Isaac, MD      . lactated ringers infusion   Intravenous Continuous Rica Koyanagi, MD 50 mL/hr at 04/10/16 1019        Review of Systems:     Cardiac Review of Systems: Y or N  Chest Pain [ y   ]  Resting SOB [  n ] Exertional SOB  [ n ]  Seth Gilbert  ]   Pedal Edema [ n  ]    Palpitations [n  ] Syncope  [n  ]   Presyncope [  n ]  General Review of Systems: [Y] = yes [  ]=no Constitional: recent weight change [n  ];  Wt loss over the last 3 months [   ] anorexia [  ]; fatigue [  ]; nausea [  ]; night sweats [  ]; fever [n  ]; or chills [  ];          Dental: poor dentition[  ]; Last Dentist visit:   Eye : blurred vision [  ]; diplopia [   ]; vision changes [  ];  Amaurosis fugax[  ]; Resp: cough [  ];  wheezing[  ];  hemoptysis[  ]; shortness of breath[  ]; paroxysmal nocturnal dyspnea[  ]; dyspnea on exertion[  ]; or orthopnea[  ];  GI:  gallstones[  ], vomiting[ n ];  dysphagia[  ]; melena[ n ];  hematochezia [ n ]; heartburn[n  ];   Hx of  Colonoscopy[  ]; GU: kidney stones [  ]; hematuria[ n  ];   dysuria [  ];  nocturia[  ];  history of     obstruction [  ]; urinary frequency [  ]             Skin: rash, swelling[  ];, hair loss[  ];  peripheral edema[  ];  or itching[  ]; Musculosketetal: myalgias[  ];  joint swelling[  ];  joint erythema[  ];  joint pain[  ];  back pain[  ];  Heme/Lymph: bruising[  ];  bleeding[  ];  anemia[  ];  Neuro: TIA[n  ];  headaches[  ];  stroke[  ];  vertigo[  ];  seizures[  ];   paresthesias[  ];  difficulty walking[  ];  Psych:depression[  ]; anxiety[  ];  Endocrine: diabetes[  ];  thyroid dysfunction[  ];  Immunizations: Flu up to date [ y ]; Pneumococcal up to date [ y ];  Other:  Physical Exam: BP 140/61   Pulse 74   Temp 98.3 F (36.8 C) (Oral)   Resp 18   SpO2 100%    PHYSICAL EXAMINATION: General appearance: alert, cooperative and no distress Head: Normocephalic, without obvious abnormality, atraumatic Neck: no adenopathy, no carotid bruit, no JVD, supple, symmetrical, trachea midline and thyroid not enlarged, symmetric, no tenderness/mass/nodules Lymph nodes: Cervical, supraclavicular, and axillary nodes normal. Resp: clear to auscultation bilaterally Back: symmetric, no curvature. ROM normal. No CVA tenderness. Cardio: regular rate and rhythm, S1, S2 normal, no murmur, click, rub or gallop GI: soft, non-tender; bowel sounds normal; no masses,  no organomegaly Extremities: extremities normal, atraumatic, no cyanosis or edema and Homans sign is negative, no sign of DVT Neurologic: Grossly normal  Diagnostic Studies & Laboratory data:     Recent Radiology Findings:     Ct Chest Wo Contrast  Result Date: 03/12/2016 CLINICAL DATA:  Abnormal chest x-ray in left lower lobe. EXAM: CT CHEST WITHOUT CONTRAST TECHNIQUE: Multidetector CT imaging of the chest was performed following the standard protocol without IV contrast. COMPARISON:  Radiograph of March 08, 2016. FINDINGS: Cardiovascular: Coronary artery calcifications are noted. Cardiac size is within normal limits. Mediastinum/Nodes: Mediastinal adenopathy is noted with subcarinal adenopathy measuring 4.5 x 2.1 cm. Precarinal nodule measuring 17 x 13 mm is noted. Right peritracheal lymph node measuring 13 x 9 mm is noted. 28 x 8 mm note is noted in aortopulmonary window. Lungs/Pleura: 2.5 x 2.4 cm mass is seen in left lower lobe inferior to left hilum. 9 mm nodule is noted in superior segment of left lower lobe. Multiple irregular densities are noted inferiorly in lingular segment, with the largest measuring 18 x 14 mm. Multiple other nodules are noted in the left lower lobe. Upper Abdomen: Moderate size hiatal hernia is noted. Nonobstructive right renal calculus is noted. Musculoskeletal: Multilevel  degenerative changes are noted in the thoracic spine. IMPRESSION: 2.5 cm mass is seen medially in left lower lobe inferior to hilum. Mediastinal adenopathy is noted concerning for metastatic disease. Multiple nodules are noted in the left upper and lower lobes concerning for metastatic disease. PET scan is recommended for further evaluation. These results will be called to the ordering clinician or representative by the Radiologist Assistant, and communication documented in the PACS or zVision Dashboard. Coronary artery calcifications are noted suggesting Coronary artery disease. Moderate size hiatal hernia is noted. Nonobstructive right renal calculus. Electronically Signed   By: Marijo Conception, M.D.   On: 03/12/2016 13:56   Nm Pet Image Initial (pi) Skull Base To Thigh  Result Date: 03/20/2016 CLINICAL DATA:  Initial treatment strategy for lung lesion. EXAM: NUCLEAR MEDICINE PET SKULL BASE TO THIGH TECHNIQUE: 10.0 mCi F-18 FDG was injected intravenously. Full-ring PET imaging was performed from the skull base to thigh after the radiotracer. CT data was obtained and used for attenuation correction and anatomic localization. FASTING BLOOD GLUCOSE:  Value: 105 mg/dl COMPARISON:  Chest CT 03/12/2016. FINDINGS: NECK No hypermetabolic lymph nodes in the neck. CHEST Previously noted large pulmonary  nodule in the medial aspect of the left lower lobe measures 2.9 x 2.6 cm and is hypermetabolic (SUVmax = 99991111). Several other smaller nodular densities also noted throughout the left lower lobe and lingula are also hypermetabolic. There are numerous extrapleural nodular densities throughout the left hemithorax, presumably extrapleural lymph nodes. These are diffusely hypermetabolic. Specific examples include a hypermetabolic (SUVmax = 0000000) 1 cm short axis lymph node (image 91 of series 4) in the posteromedial aspect of the left hemithorax (image 91 of series 4). There are numerous hypermetabolic mediastinal lymph nodes,  the largest cluster of which is centered around the mid thoracic esophagus. Although these are difficult to discretely measure they are markedly hypermetabolic (SUVmax = 123456). Large 18 mm short axis juxta diaphragmatic lymph node noted in the lower middle mediastinum (image 86 of series 4) is hypermetabolic (SUVmax = 99991111). Superior mediastinal lymph nodes measuring up to 11 mm in short axis immediately anterior to the left common carotid artery origin (image 56 of series 4) are hypermetabolic (SUVmax = 8.8). Right supraclavicular and subpectoral lymphadenopathy is also noted. Nonenlarged but hypermetabolic left internal mammary lymph nodes are also noted (SUVmax = 8.0). There is aortic atherosclerosis, as well as atherosclerosis of the great vessels of the mediastinum and the coronary arteries, including calcified atherosclerotic plaque in the left main, left anterior descending, left circumflex and right coronary arteries. Calcifications of the aortic valve. Moderate-sized hiatal hernia. Multiple small hypermetabolic retrocrural lymph nodes. ABDOMEN/PELVIS No abnormal hypermetabolic activity within the liver, pancreas, adrenal glands, or spleen. Extensive retroperitoneal hypermetabolic lymphadenopathy, largest of which is a left periaortic node measuring 1.7 cm in short axis (image 126 of series 4) which is hypermetabolic (SUVmax = [id number]). 4 mm nonobstructive calculus in the upper pole collecting system of the right kidney. No ureteral stones or findings of urinary tract obstruction are noted at this time. No pathologic dilatation of small bowel or colon. Numerous colonic diverticulae are noted, without definite surrounding inflammatory changes to suggest an acute diverticulitis at this time. There is a focal area of colonic wall thickening involving the mid sigmoid colon (image 160 of series 4) which demonstrates some low-level hypermetabolic activity (SUVmax = 6.2). SKELETON No focal hypermetabolic activity to  suggest skeletal metastasis. IMPRESSION: 1. Previously noted pulmonary nodules are hypermetabolic, with the largest of these in the medial left lower lobe measuring 2.9 x 2.6 cm. This largest nodule in particular is suspicious for a primary bronchogenic neoplasm. The other smaller nodules could be infectious/inflammatory related to postobstructive changes, or could represent metastatic lesions. At this time, there is widespread metastatic lymphadenopathy throughout the mediastinum, extrapleural lymph nodes throughout the left hemithorax, right supraclavicular region, left internal mammary lymph nodes, retrocrural lymph nodes, and retroperitoneal lymph nodes, as detailed above. 2. Colonic diverticulosis. In the mid sigmoid colon there is a focal area of mural thickening which demonstrates some low-level hypermetabolic activity. This could be related to chronic inflammatory changes from underlying diverticular disease, however, correlation with nonemergent colonoscopy is recommended in the near future to exclude a primary colonic neoplasm. 3. Aortic atherosclerosis, in addition to left main and 3 vessel coronary artery disease. Please note that although the presence of coronary artery calcium documents the presence of coronary artery disease, the severity of this disease and any potential stenosis cannot be assessed on this non-gated CT examination. Assessment for potential risk factor modification, dietary therapy or pharmacologic therapy may be warranted, if clinically indicated. 4. There are calcifications of the aortic valve. Echocardiographic correlation for evaluation  of potential valvular dysfunction may be warranted if clinically indicated. 5. Moderate-sized hiatal hernia. 6. Additional incidental findings, as above. Electronically Signed   By: Vinnie Langton M.D.   On: 03/20/2016 15:35    I have independently reviewed the above radiologic studies.  Recent Lab Findings: Lab Results  Component Value  Date   WBC 6.4 03/27/2016   HGB 14.6 03/27/2016   HCT 44.4 03/27/2016   PLT 189 03/27/2016   GLUCOSE 112 (H) 03/30/2016   ALT 14 (L) 03/30/2016   AST 21 03/30/2016   NA 139 03/30/2016   K 4.7 03/30/2016   CL 107 03/30/2016   CREATININE 1.13 03/30/2016   BUN 13 03/30/2016   CO2 27 03/30/2016   INR 1.21 03/30/2016      Assessment / Plan:   1/ Non Hodgkin lymphoma, to start chemotherapy soon, referred  for placement of   Port  2/Colonic diverticulosis 3/Aortic atherosclerosis, in addition to left main and 3 vessel coronary artery clacification- asymptomatic 4/Moderate-sized hiatal hernia  The goals risks and alternatives of the planned surgical procedure portacath  have been discussed with the patient in detail. The risks of the procedure including death, infection, stroke, myocardial infarction, bleeding, blood transfusion  Pneumothorax and need for chest tube have all been discussed specifically.  I have quoted Seth Gilbert a 1 % of perioperative mortality and a complication rate as high as 15 %. The patient's questions have been answered.Seth Gilbert is willing  to proceed with the planned procedure.    Grace Isaac MD      [address] [place] [address] Office [phone]   Beeper [phone]  04/10/2016 11:30 AM

## 2016-04-10 NOTE — Discharge Instructions (Signed)
Implanted Port Insertion, Care After °Refer to this sheet in the next few weeks. These instructions provide you with information on caring for yourself after your procedure. Your health care provider may also give you more specific instructions. Your treatment has been planned according to current medical practices, but problems sometimes occur. Call your health care provider if you have any problems or questions after your procedure. °WHAT TO EXPECT AFTER THE PROCEDURE °After your procedure, it is typical to have the following:  °· Discomfort at the port insertion site. Ice packs to the area will help. °· Bruising on the skin over the port. This will subside in 3-4 days. °HOME CARE INSTRUCTIONS °· After your port is placed, you will get a manufacturer's information card. The card has information about your port. Keep this card with you at all times.   °· Know what kind of port you have. There are many types of ports available.   °· Wear a medical alert bracelet in case of an emergency. This can help alert health care workers that you have a port.   °· The port can stay in for as long as your health care provider believes it is necessary.   °· A home health care nurse may give medicines and take care of the port.   °· You or a family member can get special training and directions for giving medicine and taking care of the port at home.   °SEEK MEDICAL CARE IF:  °· Your port does not flush or you are unable to get a blood return.   °· You have a fever or chills. °SEEK IMMEDIATE MEDICAL CARE IF: °· You have new fluid or pus coming from your incision.   °· You notice a bad smell coming from your incision site.   °· You have swelling, pain, or more redness at the incision or port site.   °· You have chest pain or shortness of breath. °  °This information is not intended to replace advice given to you by your health care provider. Make sure you discuss any questions you have with your health care provider. °  °Document  Released: 06/03/2013 Document Revised: 08/18/2013 Document Reviewed: 06/03/2013 °Elsevier Interactive Patient Education ©2016 [employer] ° °

## 2016-04-11 ENCOUNTER — Other Ambulatory Visit: Payer: Self-pay | Admitting: Radiology

## 2016-04-11 ENCOUNTER — Encounter (HOSPITAL_COMMUNITY): Payer: Self-pay | Admitting: Cardiothoracic Surgery

## 2016-04-12 ENCOUNTER — Ambulatory Visit (HOSPITAL_COMMUNITY)
Admission: RE | Admit: 2016-04-12 | Discharge: 2016-04-12 | Disposition: A | Discharge status: Home / Self Care | Payer: Medicare Other | Source: Ambulatory Visit | Attending: Internal Medicine | Admitting: Internal Medicine

## 2016-04-12 ENCOUNTER — Encounter (HOSPITAL_COMMUNITY): Payer: Self-pay

## 2016-04-12 ENCOUNTER — Telehealth: Payer: Self-pay | Admitting: Internal Medicine

## 2016-04-12 DIAGNOSIS — K219 Gastro-esophageal reflux disease without esophagitis: Secondary | ICD-10-CM | POA: Diagnosis not present

## 2016-04-12 DIAGNOSIS — C8338 Diffuse large B-cell lymphoma, lymph nodes of multiple sites: Secondary | ICD-10-CM

## 2016-04-12 DIAGNOSIS — K5792 Diverticulitis of intestine, part unspecified, without perforation or abscess without bleeding: Secondary | ICD-10-CM | POA: Diagnosis not present

## 2016-04-12 DIAGNOSIS — D7589 Other specified diseases of blood and blood-forming organs: Secondary | ICD-10-CM | POA: Diagnosis not present

## 2016-04-12 DIAGNOSIS — K449 Diaphragmatic hernia without obstruction or gangrene: Secondary | ICD-10-CM | POA: Diagnosis not present

## 2016-04-12 DIAGNOSIS — Z87442 Personal history of urinary calculi: Secondary | ICD-10-CM | POA: Diagnosis not present

## 2016-04-12 DIAGNOSIS — C8513 Unspecified B-cell lymphoma, intra-abdominal lymph nodes: Secondary | ICD-10-CM | POA: Diagnosis not present

## 2016-04-12 DIAGNOSIS — L719 Rosacea, unspecified: Secondary | ICD-10-CM | POA: Insufficient documentation

## 2016-04-12 DIAGNOSIS — R011 Cardiac murmur, unspecified: Secondary | ICD-10-CM | POA: Insufficient documentation

## 2016-04-12 DIAGNOSIS — R12 Heartburn: Secondary | ICD-10-CM | POA: Insufficient documentation

## 2016-04-12 DIAGNOSIS — R590 Localized enlarged lymph nodes: Secondary | ICD-10-CM | POA: Diagnosis not present

## 2016-04-12 DIAGNOSIS — C8590 Non-Hodgkin lymphoma, unspecified, unspecified site: Secondary | ICD-10-CM | POA: Diagnosis not present

## 2016-04-12 DIAGNOSIS — H9190 Unspecified hearing loss, unspecified ear: Secondary | ICD-10-CM | POA: Insufficient documentation

## 2016-04-12 LAB — CBC WITH DIFFERENTIAL/PLATELET
Basophils Absolute: 0 10*3/uL (ref 0.0–0.1)
Basophils Relative: 0 %
EOS ABS: 0.1 10*3/uL (ref 0.0–0.7)
EOS PCT: 1 %
HCT: 41.2 % (ref 39.0–52.0)
Hemoglobin: 14.3 g/dL (ref 13.0–17.0)
LYMPHS ABS: 2.8 10*3/uL (ref 0.7–4.0)
Lymphocytes Relative: 29 %
MCH: 32.6 pg (ref 26.0–34.0)
MCHC: 34.7 g/dL (ref 30.0–36.0)
MCV: 93.8 fL (ref 78.0–100.0)
MONO ABS: 0.9 10*3/uL (ref 0.1–1.0)
MONOS PCT: 9 %
Neutro Abs: 6 10*3/uL (ref 1.7–7.7)
Neutrophils Relative %: 61 %
PLATELETS: 175 10*3/uL (ref 150–400)
RBC: 4.39 MIL/uL (ref 4.22–5.81)
RDW: 13.8 % (ref 11.5–15.5)
WBC: 9.9 10*3/uL (ref 4.0–10.5)

## 2016-04-12 LAB — PROTIME-INR
INR: 1.12
Prothrombin Time: 14.4 seconds (ref 11.4–15.2)

## 2016-04-12 LAB — BONE MARROW EXAM

## 2016-04-12 LAB — APTT: aPTT: 28 seconds (ref 24–36)

## 2016-04-12 MED ORDER — FENTANYL CITRATE (PF) 100 MCG/2ML IJ SOLN
INTRAMUSCULAR | Status: AC | PRN
  Administered 2016-04-12: 50 ug via INTRAVENOUS

## 2016-04-12 MED ORDER — FENTANYL CITRATE (PF) 100 MCG/2ML IJ SOLN
INTRAMUSCULAR | Status: AC
  Filled 2016-04-12: qty 4

## 2016-04-12 MED ORDER — MIDAZOLAM HCL 2 MG/2ML IJ SOLN
INTRAMUSCULAR | Status: AC
  Filled 2016-04-12: qty 6

## 2016-04-12 MED ORDER — SODIUM CHLORIDE 0.9 % IV SOLN
INTRAVENOUS | Status: DC
  Administered 2016-04-12: 07:00:00 via INTRAVENOUS

## 2016-04-12 MED ORDER — MIDAZOLAM HCL 2 MG/2ML IJ SOLN
INTRAMUSCULAR | Status: AC | PRN
  Administered 2016-04-12 (×5): 1 mg via INTRAVENOUS

## 2016-04-12 NOTE — Telephone Encounter (Signed)
lvm to inform pt of stress echo 8/28 at 2pm

## 2016-04-12 NOTE — Progress Notes (Signed)
Patient ID: Seth Gilbert, male   DOB: 12-Feb-1936, 80 y.o.   MRN: [id number]    Referring Physician(s): Mohamed,Mohamed  Supervising Physician: Daryll Brod  Patient Status:  Outpatient  Chief Complaint:  "I'm having some biopsies done"  Subjective: Patient familiar to IR service from prior biopsy of a right supraclavicular lymph node on 03/27/16 which revealed benign lymphoid tissue. He has since undergone EUS transbronchial biopsies by Dr. Servando Snare which revealed atypical lymphoid population. Flow cytometry was consistent with monoclonal B-cell population/non-Hodgkin B-cell lymphoma. He presents again today for left retroperitoneal lymph node as well as bone marrow biopsies for additional lymphoma workup. He currently denies fever, headache, chest pain, dyspnea, cough, nausea, vomiting or abnormal bleeding. He does have occasional back pain and he is hard of hearing.  Past Medical History:  Diagnosis Date  . Diverticulitis   . GERD (gastroesophageal reflux disease)    Heartburn at times  . Heart murmur    "comes and goes" onset 4th grade - last time anyone heard it was 01/2014  . History of hiatal hernia   . History of kidney stones   . HOH (hard of hearing)   . PONV (postoperative nausea and vomiting)   . Rosacea   . Wears glasses    Past Surgical History:  Procedure Laterality Date  . APPENDECTOMY    . COLONOSCOPY    . HEMORROIDECTOMY     inflammed anal gland removed  . INGUINAL HERNIA REPAIR Left 02/11/2014   Procedure: LEFT INGUINAL HERNIA REPAIR WITH MESH ;  Surgeon: Joyice Faster. Cornett, MD;  Location: [place];  Service: General;  Laterality: Left;  . INSERTION OF MESH N/A 02/11/2014   Procedure: INSERTION OF MESH;  Surgeon: Joyice Faster. Cornett, MD;  Location: [place];  Service: General;  Laterality: N/A;  . PORTACATH PLACEMENT Left 04/10/2016   Procedure: INSERTION PORT-A-CATH into LEFT Subclavian Vein with guidance from ultrasound and  flouroscopy;  Surgeon: Grace Isaac, MD;  Location: [place];  Service: Thoracic;  Laterality: Left;  . TONSILLECTOMY    . VIDEO BRONCHOSCOPY WITH ENDOBRONCHIAL ULTRASOUND N/A 03/30/2016   Procedure: VIDEO BRONCHOSCOPY WITH ENDOBRONCHIAL ULTRASOUND,WITH TRANSBRONCHIAL BIOPSY;  Surgeon: Grace Isaac, MD;  Location: [facility] OR;  Service: Thoracic;  Laterality: N/A;    Allergies: No known allergies  Medications: Prior to Admission medications   Medication Sig Start Date End Date Taking? Authorizing Provider  ampicillin (PRINCIPEN) 500 MG capsule Take 500 mg by mouth daily.     Historical Provider, MD  famotidine (PEPCID) 40 MG tablet Take 40 mg by mouth daily as needed for heartburn.     Historical Provider, MD  Glucosamine-Chondroit-Vit C-Mn (GLUCOSAMINE 1500 COMPLEX) CAPS Take 3 capsules by mouth every morning.     Historical Provider, MD  HYDROcodone-acetaminophen (NORCO/VICODIN) 5-325 MG tablet Take 1 tablet by mouth every 6 (six) hours as needed for moderate pain. 04/10/16   Grace Isaac, MD  metroNIDAZOLE (METROGEL) 0.75 % gel Apply 1 application topically daily.  12/04/13   Historical Provider, MD  Multiple Vitamins-Minerals (CENTRUM SILVER PO) Take 1 tablet by mouth daily.     Historical Provider, MD  senna (SENOKOT) 8.6 MG tablet Take 1 tablet by mouth daily as needed for constipation.    Historical Provider, MD  vitamin E 400 UNIT capsule Take 400 Units by mouth daily.    Historical Provider, MD     Vital Signs:Blood pressure 114/63, heart rate 80, temperature 97.5, respirations 18, O2 sat 96% room  air   Physical Exam pt awake, alert. Chest clear to auscultation bilaterally. Clean, intact left chest wall Port-A-Cath. Heart with regular rate and rhythm. Abdomen soft, positive bowel sounds, nontender. Extremities -no edema  Imaging: Dg Chest 2 View  Result Date: 04/10/2016 CLINICAL DATA:  Port-A-Cath placement EXAM: FLOURO GUIDE CV LINE - NO REPORT; CHEST - 2 VIEW COMPARISON:   Study obtained earlier in the day FINDINGS: Port-A-Cath tip is in the superior vena cava. No pneumothorax. Scarring in the left base is stable. No new opacity. Heart size and pulmonary vascularity within normal limits. No adenopathy evident. There is degenerative change in the thoracic spine. IMPRESSION: Port-A-Cath tip in superior vena cava. No pneumothorax. Scarring left base. No new opacity. Stable cardiac silhouette. Electronically Signed   By: Lowella Grip III M.D.   On: 04/10/2016 13:48   Dg Chest 2 View  Result Date: 04/10/2016 CLINICAL DATA:  Preoperative evaluation for Port-A-Cath insertion. Hodgkin's lymphoma. EXAM: CHEST  2 VIEW COMPARISON:  March 30, 2016 FINDINGS: There is persistent scarring in the left base region. There is no demonstrable edema or consolidation by radiography. Heart size and pulmonary vascularity normal. No adenopathy evident. There is degenerative change in the thoracic spine. IMPRESSION: No edema or consolidation. No mass or adenopathy appreciable by radiography. There is scarring in the left base. Electronically Signed   By: Lowella Grip III M.D.   On: 04/10/2016 10:45   Dg Fluoro Guide Cv Line-no Report  Result Date: 04/10/2016 CLINICAL DATA:  FLOURO GUIDE CV LINE Fluoroscopy was utilized by the requesting physician.  No radiographic interpretation.    Labs:  CBC:  Recent Labs  03/27/16 1216 04/12/16 0715  WBC 6.4 9.9  HGB 14.6 14.3  HCT 44.4 41.2  PLT 189 175    COAGS:  Recent Labs  03/27/16 1216 03/30/16 0856 04/12/16 0715  INR 1.32 1.21 1.12  APTT 30  --  28    BMP:  Recent Labs  03/30/16 0856  NA 139  K 4.7  CL 107  CO2 27  GLUCOSE 112*  BUN 13  CALCIUM 9.2  CREATININE 1.13  GFRNONAA >60  GFRAA >60    LIVER FUNCTION TESTS:  Recent Labs  03/30/16 0856  BILITOT 0.7  AST 21  ALT 14*  ALKPHOS 61  PROT 6.5  ALBUMIN 3.7    Assessment and Plan: Patient with recently diagnosed large B-cell  lymphoma/non-Hodgkin's lymphoma. He presents today for bone marrow as well as left retroperitoneal lymph node biopsies for additional lymphoma workup.Risks and benefits discussed with the patient/wife including, but not limited to bleeding, infection, damage to adjacent structures or low yield requiring additional tests.All of the patient's questions were answered, patient is agreeable to proceed.Consent signed and in chart.     Electronically Signed: D. Rowe Ramzey 04/12/2016, 8:12 AM   I spent a total of 20 minutes at the the patient's bedside AND on the patient's hospital floor or unit, greater than 50% of which was counseling/coordinating care for CT-guided left retroperitoneal lymph node and bone marrow biopsies

## 2016-04-12 NOTE — Discharge Instructions (Signed)
Needle Biopsy, Care After These instructions give you information about caring for yourself after your procedure. Your doctor may also give you more specific instructions. Call your doctor if you have any problems or questions after your procedure. HOME CARE  Rest as told by your doctor.  Take medicines only as told by your doctor.  There are many different ways to close and cover the biopsy site, including stitches (sutures), skin glue, and adhesive strips. Follow instructions from your doctor about:  How to take care of your biopsy site.  When and how you should change your bandage (dressing).  When you should remove your dressing.  Removing whatever was used to close your biopsy site.  Check your biopsy site every day for signs of infection. Watch for:  Redness, swelling, or pain.  Fluid, blood, or pus. GET HELP IF:  You have a fever.  You have redness, swelling, or pain at the biopsy site, and it lasts longer than a few days.  You have fluid, blood, or pus coming from the biopsy site.  You feel sick to your stomach (nauseous).  You throw up (vomit). GET HELP RIGHT AWAY IF:  You are short of breath.  You have trouble breathing.  Your chest hurts.  You feel dizzy or you pass out (faint).  You have bleeding that does not stop with pressure or a bandage.  You cough up blood.  Your belly (abdomen) hurts.   This information is not intended to replace advice given to you by your health care provider. Make sure you discuss any questions you have with your health care provider.   Document Released: 07/26/2008 Document Revised: 12/28/2014 Document Reviewed: 08/09/2014 Elsevier Interactive Patient Education 2016 [place]. Bone Marrow Aspiration and Bone Marrow Biopsy Bone marrow aspiration and bone marrow biopsy are procedures that are done to diagnose blood disorders. You may also have one of these procedures to help diagnose infections or some types of  cancer. Bone marrow is the soft tissue that is inside your bones. Blood cells are produced in bone marrow. For bone marrow aspiration, a sample of tissue in liquid form is removed from inside your bone. For a bone marrow biopsy, a small core of bone marrow tissue is removed. Then these samples are examined under a microscope or tested in a lab. You may need these procedures if you have an abnormal complete blood count (CBC). The aspiration or biopsy sample is usually taken from the top of your hip bone. Sometimes, an aspiration sample is taken from your chest bone (sternum). LET [facility] CARE PROVIDER KNOW ABOUT:  Any allergies you have.  All medicines you are taking, including vitamins, herbs, eye drops, creams, and over-the-counter medicines.  Previous problems you or members of your family have had with the use of anesthetics.  Any blood disorders you have.  Previous surgeries you have had.  Any medical conditions you may have.  Whether you are pregnant or you think that you may be pregnant. RISKS AND COMPLICATIONS Generally, this is a safe procedure. However, problems may occur, including:  Infection.  Bleeding. BEFORE THE PROCEDURE  Ask your health care provider about:  Changing or stopping your regular medicines. This is especially important if you are taking diabetes medicines or blood thinners.  Taking medicines such as aspirin and ibuprofen. These medicines can thin your blood. Do not take these medicines before your procedure if your health care provider instructs you not to.  Plan to have someone take you home  after the procedure.  If you go home right after the procedure, plan to have someone with you for 24 hours. PROCEDURE   An IV tube may be inserted into one of your veins.  The injection site will be cleaned with a germ-killing solution (antiseptic).  You will be given one or more of the following:  A medicine that helps you relax (sedative).  A  medicine that numbs the area (local anesthetic).  The bone marrow sample will be removed as follows:  For an aspiration, a hollow needle will be inserted through your skin and into your bone. Bone marrow fluid will be drawn up into a syringe.  For a biopsy, your health care provider will use a hollow needle to remove a core of tissue from your bone marrow.  The needle will be removed.  A bandage (dressing) will be placed over the insertion site and taped in place. The procedure may vary among health care providers and hospitals. AFTER THE PROCEDURE  Your blood pressure, heart rate, breathing rate, and blood oxygen level will be monitored often until the medicines you were given have worn off.  Return to your normal activities as directed by your health care provider.   This information is not intended to replace advice given to you by your health care provider. Make sure you discuss any questions you have with your health care provider.   Document Released: 08/16/2004 Document Revised: 12/28/2014 Document Reviewed: 08/04/2014 Elsevier Interactive Patient Education 2016 [place]. Bone Marrow Aspiration and Bone Marrow Biopsy, Care After Refer to this sheet in the next few weeks. These instructions provide you with information about caring for yourself after your procedure. Your health care provider may also give you more specific instructions. Your treatment has been planned according to current medical practices, but problems sometimes occur. Call your health care provider if you have any problems or questions after your procedure. WHAT TO EXPECT AFTER THE PROCEDURE After your procedure, it is common to have:  Soreness or tenderness around the puncture site.  Bruising. HOME CARE INSTRUCTIONS  Take medicines only as directed by your health care provider.  Follow your health care provider's instructions about:  Puncture site care.  Bandage (dressing) changes and  removal.  Bathe and shower as directed by your health care provider.  Check your puncture site every day for signs of infection. Watch for:  Redness, swelling, or pain.  Fluid, blood, or pus.  Return to your normal activities as directed by your health care provider.  Keep all follow-up visits as directed by your health care provider. This is important. SEEK MEDICAL CARE IF:  You have a fever.  You have uncontrollable bleeding.  You have redness, swelling, or pain at the site of your puncture.  You have fluid, blood, or pus coming from your puncture site.   This information is not intended to replace advice given to you by your health care provider. Make sure you discuss any questions you have with your health care provider.   Document Released: 03/02/2005 Document Revised: 12/28/2014 Document Reviewed: 08/04/2014 Elsevier Interactive Patient Education 2016 [employer] Moderate Conscious Sedation, Adult Sedation is the use of medicines to promote relaxation and relieve discomfort and anxiety. Moderate conscious sedation is a type of sedation. Under moderate conscious sedation you are less alert than normal but are still able to respond to instructions or stimulation. Moderate conscious sedation is used during short medical and dental procedures. It is milder than deep sedation or general  anesthesia and allows you to return to your regular activities sooner. LET [facility] CARE PROVIDER KNOW ABOUT:   Any allergies you have.  All medicines you are taking, including vitamins, herbs, eye drops, creams, and over-the-counter medicines.  Use of steroids (by mouth or creams).  Previous problems you or members of your family have had with the use of anesthetics.  Any blood disorders you have.  Previous surgeries you have had.  Medical conditions you have.  Possibility of pregnancy, if this applies.  Use of cigarettes, alcohol, or illegal drugs. RISKS AND  COMPLICATIONS Generally, this is a safe procedure. However, as with any procedure, problems can occur. Possible problems include:  Oversedation.  Trouble breathing on your own. You may need to have a breathing tube until you are awake and breathing on your own.  Allergic reaction to any of the medicines used for the procedure. BEFORE THE PROCEDURE  You may have blood tests done. These tests can help show how well your kidneys and liver are working. They can also show how well your blood clots.  A physical exam will be done.  Only take medicines as directed by your health care provider. You may need to stop taking medicines (such as blood thinners, aspirin, or nonsteroidal anti-inflammatory drugs) before the procedure.   Do not eat or drink at least 6 hours before the procedure or as directed by your health care provider.  Arrange for a responsible adult, family member, or friend to take you home after the procedure. He or she should stay with you for at least 24 hours after the procedure, until the medicine has worn off. PROCEDURE   An intravenous (IV) catheter will be inserted into one of your veins. Medicine will be able to flow directly into your body through this catheter. You may be given medicine through this tube to help prevent pain and help you relax.  The medical or dental procedure will be done. AFTER THE PROCEDURE  You will stay in a recovery area until the medicine has worn off. Your blood pressure and pulse will be checked.   Depending on the procedure you had, you may be allowed to go home when you can tolerate liquids and your pain is under control.   This information is not intended to replace advice given to you by your health care provider. Make sure you discuss any questions you have with your health care provider.   Document Released: 05/08/2001 Document Revised: 09/03/2014 Document Reviewed: 04/20/2013 Elsevier Interactive Patient Education [employer].

## 2016-04-12 NOTE — Procedures (Signed)
Lymphoma  S/p RT ILIAC BM ASP AND CORE BX  S/P LT RP PARA AORTIC LN CORE BX  NO COMP STABLE FULL REPORT IN PACS PATH PENDING

## 2016-04-12 NOTE — Op Note (Signed)
NAMERHODES, TWOREK NO.:  [id number]  MEDICAL RECORD NO.:  [id number]  LOCATION:  [facility]                         FACILITY:  [facility]  PHYSICIAN:  Lanelle Bal, MD    DATE OF BIRTH:  1935/08/30  DATE OF PROCEDURE:  04/10/2016 DATE OF DISCHARGE:  04/10/2016                              OPERATIVE REPORT   PREOPERATIVE DIAGNOSIS:  Non-Hodgkin lymphoma.  POSTOP DIAGNOSIS:  Non-Hodgkin lymphoma.  SURGICAL PROCEDURE:  Placement of left subclavian vein Port-A-Cath with ultrasound and fluoroscopic guidance.  SURGEON:  Lanelle Bal, MD.  BRIEF HISTORY:  The patient is a 80 year old male who had been worked up the previous week because of lung mass and multiple adenopathy and was found to have by flow cytometry, B-cell lymphoma.  He was seen by Oncology in consultation and is to start chemotherapy with CHOP and Rituxan in the near future.  He was referred for placement of a Port-A- Cath.  The risks and options were discussed with the patient, and he agreed and signed informed consent.  DESCRIPTION OF PROCEDURE:  With sedation and LMA, endotracheal tube in place, the patient was in the supine position.  The chest was prepped with Betadine and draped in sterile manner.  Appropriate time-out was performed and then we approached the left subclavian vein first.  The patient is right-handed.  1% lidocaine was infiltrated in the left infraclavicular area.  Using a SonoSite, the left subclavian vein was identified.  16-gauge needle was introduced into the vein and under fluoroscopic guidance, a guidewire was placed into the superior vena cava and confirming this, the wire was passed easily all the way into the inferior vena cava.  The wire was placed, pulled back in good position.  We then made a counter incision over the left anterior chest, and a subcutaneous pocket was created for the 9.5-French PowerPort preattached Port-A-Cath.  Port-A-Cath was flushed with saline,  was then tunneled subcutaneously from the pocket to the insertion site.  The catheter was trimmed to the appropriate length.  Under fluoroscopic guidance, a dilator with a peel-away sheath was passed over the guidewire and positioned in the superior vena cava.  The Port-A-Cath was then passed through the sheath and positioned in the superior vena cava which appeared by fluoroscopic exam to be in good position.  There was easy blood draw back from the Port-A-Cath.  The Port-A-Cath was then flushed with 2.5 mL of concentrated heparin solution.  The incisions were then closed with interrupted 3-0 Vicryl and 4-0 subcuticular stitch.  Dermabond was applied to the skin.  The patient was awakened in the operating room and transferred to the recovery room for further postoperative care and to obtain a PA and lateral chest x-ray. Blood loss was minimal.  Sponge and needle count was reported as correct at the completion of the procedure.  The patient tolerated the procedure without obvious complications.     Lanelle Bal, MD     EG/MEDQ  D:  04/11/2016  T:  04/11/2016  Job:  [id number]

## 2016-04-12 NOTE — Discharge Instructions (Signed)
Lymph node biopsy and Bone Marrow Aspiration and Bone Marrow Biopsy, Care After Refer to this sheet in the next few weeks. These instructions provide you with information about caring for yourself after your procedure. Your health care provider may also give you more specific instructions. Your treatment has been planned according to current medical practices, but problems sometimes occur. Call your health care provider if you have any problems or questions after your procedure. WHAT TO EXPECT AFTER THE PROCEDURE After your procedure, it is common to have:  Soreness or tenderness around the puncture site.  Bruising. HOME CARE INSTRUCTIONS  Take medicines only as directed by your health care provider.  Follow your health care provider's instructions about:  Puncture site care.  Bandage (dressing) changes and removal.  Bathe and shower as directed by your health care provider.  Check your puncture site every day for signs of infection. Watch for:  Redness, swelling, or pain.  Fluid, blood, or pus.  Return to your normal activities as directed by your health care provider.  Keep all follow-up visits as directed by your health care provider. This is important. SEEK MEDICAL CARE IF:  You have a fever.  You have uncontrollable bleeding.  You have redness, swelling, or pain at the site of your puncture.  You have fluid, blood, or pus coming from your puncture site.   This information is not intended to replace advice given to you by your health care provider. Make sure you discuss any questions you have with your health care provider. Moderate Conscious Sedation, Adult, Care After Refer to this sheet in the next few weeks. These instructions provide you with information on caring for yourself after your procedure. Your health care provider may also give you more specific instructions. Your treatment has been planned according to current medical practices, but problems sometimes  occur. Call your health care provider if you have any problems or questions after your procedure. WHAT TO EXPECT AFTER THE PROCEDURE  After your procedure:  You may feel sleepy, clumsy, and have poor balance for several hours.  Vomiting may occur if you eat too soon after the procedure. HOME CARE INSTRUCTIONS  Do not participate in any activities where you could become injured for at least 24 hours. Do not:  Drive.  Swim.  Ride a bicycle.  Operate heavy machinery.  Cook.  Use power tools.  Climb ladders.  Work from a high place.  Do not make important decisions or sign legal documents until you are improved.  If you vomit, drink water, juice, or soup when you can drink without vomiting. Make sure you have little or no nausea before eating solid foods.  Only take over-the-counter or prescription medicines for pain, discomfort, or fever as directed by your health care provider.  Make sure you and your family fully understand everything about the medicines given to you, including what side effects may occur.  You should not drink alcohol, take sleeping pills, or take medicines that cause drowsiness for at least 24 hours.  If you smoke, do not smoke without supervision.  If you are feeling better, you may resume normal activities 24 hours after you were sedated.  Keep all appointments with your health care provider. SEEK MEDICAL CARE IF:  Your skin is pale or bluish in color.  You continue to feel nauseous or vomit.  Your pain is getting worse and is not helped by medicine.  You have bleeding or swelling.  You are still sleepy or feeling clumsy  after 24 hours. SEEK IMMEDIATE MEDICAL CARE IF:  You develop a rash.  You have difficulty breathing.  You develop any type of allergic problem.  You have a fever. MAKE SURE YOU:  Understand these instructions.  Will watch your condition.  Will get help right away if you are not doing well or get worse.   This  information is not intended to replace advice given to you by your health care provider. Make sure you discuss any questions you have with your health care provider.   Document Released: 06/03/2013 Document Revised: 09/03/2014 Document Reviewed: 06/03/2013 Elsevier Interactive Patient Education 2016 [place] Released: 03/02/2005 Document Revised: 12/28/2014 Document Reviewed: 08/04/2014 Elsevier Interactive Patient Education [employer].

## 2016-04-13 NOTE — Anesthesia Postprocedure Evaluation (Signed)
Anesthesia Post Note  Patient: Seth Gilbert  Procedure(s) Performed: Procedure(s) (LRB): INSERTION PORT-A-CATH into LEFT Subclavian Vein with guidance from ultrasound and flouroscopy (Left)  Patient location during evaluation: PACU Anesthesia Type: General Level of consciousness: awake and alert Pain management: pain level controlled Vital Signs Assessment: post-procedure vital signs reviewed and stable Respiratory status: spontaneous breathing, nonlabored ventilation, respiratory function stable and patient connected to nasal cannula oxygen Cardiovascular status: blood pressure returned to baseline and stable Postop Assessment: no signs of nausea or vomiting Anesthetic complications: no    Last Vitals:  Vitals:   04/10/16 1405 04/10/16 1432  BP:  129/61  Pulse:  81  Resp:  18  Temp: 36.5 C     Last Pain:  Vitals:   04/10/16 1014  TempSrc: Oral                 Chan Sheahan,JAMES TERRILL

## 2016-04-16 NOTE — Anesthesia Postprocedure Evaluation (Signed)
Anesthesia Post Note  Patient: KIYANSH HORNECKER  Procedure(s) Performed: Procedure(s) (LRB): INSERTION PORT-A-CATH into LEFT Subclavian Vein with guidance from ultrasound and flouroscopy (Left)  Patient location during evaluation: PACU Anesthesia Type: General Level of consciousness: awake and alert Pain management: pain level controlled Vital Signs Assessment: post-procedure vital signs reviewed and stable Respiratory status: spontaneous breathing, nonlabored ventilation, respiratory function stable and patient connected to nasal cannula oxygen Cardiovascular status: blood pressure returned to baseline and stable Postop Assessment: no signs of nausea or vomiting Anesthetic complications: no    Last Vitals:  Vitals:   04/10/16 1405 04/10/16 1432  BP:  129/61  Pulse:  81  Resp:  18  Temp: 36.5 C     Last Pain:  Vitals:   04/10/16 1014  TempSrc: Oral                 Everett Ehrler,JAMES TERRILL

## 2016-04-16 NOTE — Addendum Note (Signed)
Addendum  created 04/16/16 1922 by Rica Koyanagi, MD   Sign clinical note

## 2016-04-17 ENCOUNTER — Ambulatory Visit (HOSPITAL_BASED_OUTPATIENT_CLINIC_OR_DEPARTMENT_OTHER): Payer: Medicare Other | Admitting: Internal Medicine

## 2016-04-17 ENCOUNTER — Other Ambulatory Visit (HOSPITAL_BASED_OUTPATIENT_CLINIC_OR_DEPARTMENT_OTHER): Payer: Medicare Other

## 2016-04-17 ENCOUNTER — Encounter: Payer: Self-pay | Admitting: Internal Medicine

## 2016-04-17 VITALS — BP 138/72 | HR 77 | Temp 98.2°F | Resp 17 | Ht 68.0 in | Wt 196.5 lb

## 2016-04-17 DIAGNOSIS — C8338 Diffuse large B-cell lymphoma, lymph nodes of multiple sites: Secondary | ICD-10-CM | POA: Diagnosis not present

## 2016-04-17 DIAGNOSIS — Z5111 Encounter for antineoplastic chemotherapy: Secondary | ICD-10-CM | POA: Insufficient documentation

## 2016-04-17 LAB — CBC WITH DIFFERENTIAL/PLATELET
BASO%: 0.1 % (ref 0.0–2.0)
Basophils Absolute: 0 10*3/uL (ref 0.0–0.1)
EOS%: 2.4 % (ref 0.0–7.0)
Eosinophils Absolute: 0.2 10*3/uL (ref 0.0–0.5)
HCT: 42 % (ref 38.4–49.9)
HGB: 14.2 g/dL (ref 13.0–17.1)
LYMPH%: 20.5 % (ref 14.0–49.0)
MCH: 32.3 pg (ref 27.2–33.4)
MCHC: 33.8 g/dL (ref 32.0–36.0)
MCV: 95.5 fL (ref 79.3–98.0)
MONO#: 0.6 10*3/uL (ref 0.1–0.9)
MONO%: 8.6 % (ref 0.0–14.0)
NEUT%: 68.4 % (ref 39.0–75.0)
NEUTROS ABS: 4.8 10*3/uL (ref 1.5–6.5)
PLATELETS: 161 10*3/uL (ref 140–400)
RBC: 4.4 10*6/uL (ref 4.20–5.82)
RDW: 13.6 % (ref 11.0–14.6)
WBC: 7 10*3/uL (ref 4.0–10.3)
lymph#: 1.4 10*3/uL (ref 0.9–3.3)

## 2016-04-17 LAB — COMPREHENSIVE METABOLIC PANEL
ALT: 16 U/L (ref 0–55)
ANION GAP: 8 meq/L (ref 3–11)
AST: 21 U/L (ref 5–34)
Albumin: 3.6 g/dL (ref 3.5–5.0)
Alkaline Phosphatase: 77 U/L (ref 40–150)
BUN: 15.2 mg/dL (ref 7.0–26.0)
CHLORIDE: 108 meq/L (ref 98–109)
CO2: 26 meq/L (ref 22–29)
CREATININE: 1.1 mg/dL (ref 0.7–1.3)
Calcium: 9.6 mg/dL (ref 8.4–10.4)
EGFR: 63 mL/min/{1.73_m2} — AB (ref >90)
GLUCOSE: 103 mg/dL (ref 70–140)
Potassium: 4.4 mEq/L (ref 3.5–5.1)
SODIUM: 142 meq/L (ref 136–145)
TOTAL PROTEIN: 6.8 g/dL (ref 6.4–8.3)
Total Bilirubin: 0.68 mg/dL (ref 0.20–1.20)

## 2016-04-17 LAB — LACTATE DEHYDROGENASE: LDH: 186 U/L (ref 125–245)

## 2016-04-17 LAB — URIC ACID: URIC ACID, SERUM: 6.6 mg/dL (ref 2.6–7.4)

## 2016-04-17 MED ORDER — LIDOCAINE-PRILOCAINE 2.5-2.5 % EX CREA: 1.0000 "application " | TOPICAL_CREAM | CUTANEOUS | 0 refills | Status: DC | PRN

## 2016-04-17 MED ORDER — PREDNISONE 50 MG PO TABS: ORAL_TABLET | ORAL | 0 refills | Status: DC

## 2016-04-17 MED ORDER — ALLOPURINOL 100 MG PO TABS: 100.0000 mg | ORAL_TABLET | Freq: Two times a day (BID) | ORAL | 3 refills | Status: DC

## 2016-04-17 MED ORDER — PROCHLORPERAZINE MALEATE 10 MG PO TABS: 10.0000 mg | ORAL_TABLET | Freq: Four times a day (QID) | ORAL | 0 refills | Status: DC | PRN

## 2016-04-17 NOTE — Progress Notes (Signed)
[name] [phone]   [phone]  OFFICE PROGRESS NOTE   Seth Crutch, MD [place] [address]  DIAGNOSIS: Stage II large B-cell non-Hodgkin lymphoma diagnosed in August 2017, pending further staging workup.  PRIOR THERAPY: None.  CURRENT THERAPY: Systemic chemotherapy with CHOP/Rituxan every 3 weeks with Neulasta support. First dose 04/24/2016.  INTERVAL HISTORY: Seth Gilbert 80 y.o. male returns to the clinic today for follow-up visit accompanied by his wife. The patient is feeling fine today with no specific complaints. He denied having any significant chest pain, shortness breath, cough or hemoptysis. He has no nausea, vomiting, diarrhea or constipation. He denied having any significant weight loss or night sweats. He has no fever or chills. The patient underwent several studies recently for further evaluation of his large B-cell non-Hodgkin lymphoma including CT-guided core biopsy of the retroperitoneal lymph nodes that was consistent with his diagnosis. He also underwent CT-guided bone marrow biopsy and aspirate that showed no evidence of lymphoma involvement of the bone marrow. The patient is scheduled for 2 D next week before his treatment. He also had a Port-A-Cath placed by Dr. Servando Snare. The patient is here today for evaluation and discussion of his treatment options.  MEDICAL HISTORY: Past Medical History:  Diagnosis Date  . Diverticulitis   . GERD (gastroesophageal reflux disease)    Heartburn at times  . Heart murmur    "comes and goes" onset 4th grade - last time anyone heard it was 01/2014  . History of hiatal hernia   . History of kidney stones   . HOH (hard of hearing)   . PONV (postoperative nausea and vomiting)   . Rosacea   . Wears glasses     ALLERGIES:  is allergic to no known allergies.  MEDICATIONS:  Current Outpatient Prescriptions  Medication Sig Dispense Refill  . ampicillin (PRINCIPEN)  500 MG capsule Take 500 mg by mouth daily.     . famotidine (PEPCID) 40 MG tablet Take 40 mg by mouth daily as needed for heartburn.     . Glucosamine-Chondroit-Vit C-Mn (GLUCOSAMINE 1500 COMPLEX) CAPS Take 3 capsules by mouth every morning.     Marland Kitchen HYDROcodone-acetaminophen (NORCO/VICODIN) 5-325 MG tablet Take 1 tablet by mouth every 6 (six) hours as needed for moderate pain. 15 tablet 0  . metroNIDAZOLE (METROGEL) 0.75 % gel Apply 1 application topically daily.     . Multiple Vitamins-Minerals (CENTRUM SILVER PO) Take 1 tablet by mouth daily.     Marland Kitchen senna (SENOKOT) 8.6 MG tablet Take 1 tablet by mouth daily as needed for constipation.    . vitamin E 400 UNIT capsule Take 400 Units by mouth daily.     No current facility-administered medications for this visit.     SURGICAL HISTORY:  Past Surgical History:  Procedure Laterality Date  . APPENDECTOMY    . COLONOSCOPY    . HEMORROIDECTOMY     inflammed anal gland removed  . INGUINAL HERNIA REPAIR Left 02/11/2014   Procedure: LEFT INGUINAL HERNIA REPAIR WITH MESH ;  Surgeon: Joyice Faster. Cornett, MD;  Location: [place];  Service: General;  Laterality: Left;  . INSERTION OF MESH N/A 02/11/2014   Procedure: INSERTION OF MESH;  Surgeon: Joyice Faster. Cornett, MD;  Location: [place];  Service: General;  Laterality: N/A;  . PORTACATH PLACEMENT Left 04/10/2016   Procedure: INSERTION PORT-A-CATH into LEFT Subclavian Vein with guidance from ultrasound and flouroscopy;  Surgeon:  Grace Isaac, MD;  Location: [place];  Service: Thoracic;  Laterality: Left;  . TONSILLECTOMY    . VIDEO BRONCHOSCOPY WITH ENDOBRONCHIAL ULTRASOUND N/A 03/30/2016   Procedure: VIDEO BRONCHOSCOPY WITH ENDOBRONCHIAL ULTRASOUND,WITH TRANSBRONCHIAL BIOPSY;  Surgeon: Grace Isaac, MD;  Location: [place];  Service: Thoracic;  Laterality: N/A;    REVIEW OF SYSTEMS:  Constitutional: negative Eyes: negative Ears, nose, mouth, throat, and face:  negative Respiratory: negative Cardiovascular: negative Gastrointestinal: negative Genitourinary:negative Integument/breast: negative Hematologic/lymphatic: negative Musculoskeletal:negative Neurological: negative Behavioral/Psych: negative Endocrine: negative Allergic/Immunologic: negative   PHYSICAL EXAMINATION: General appearance: alert, cooperative and no distress Head: Normocephalic, without obvious abnormality, atraumatic Neck: no adenopathy, no JVD, supple, symmetrical, trachea midline and thyroid not enlarged, symmetric, no tenderness/mass/nodules Lymph nodes: Cervical, supraclavicular, and axillary nodes normal. Resp: clear to auscultation bilaterally Back: symmetric, no curvature. ROM normal. No CVA tenderness. Cardio: regular rate and rhythm, S1, S2 normal, no murmur, click, rub or gallop GI: soft, non-tender; bowel sounds normal; no masses,  no organomegaly Extremities: extremities normal, atraumatic, no cyanosis or edema Neurologic: Alert and oriented X 3, normal strength and tone. Normal symmetric reflexes. Normal coordination and gait  ECOG PERFORMANCE STATUS: 0 - Asymptomatic  Blood pressure 138/72, pulse 77, temperature 98.2 F (36.8 C), temperature source Oral, resp. rate 17, height 5' 8"  (1.727 m), weight 196 lb 8 oz (89.1 kg), SpO2 100 %.  LABORATORY DATA: Lab Results  Component Value Date   WBC 7.0 04/17/2016   HGB 14.2 04/17/2016   HCT 42.0 04/17/2016   MCV 95.5 04/17/2016   PLT 161 04/17/2016      Chemistry      Component Value Date/Time   NA 139 03/30/2016 0856   K 4.7 03/30/2016 0856   CL 107 03/30/2016 0856   CO2 27 03/30/2016 0856   BUN 13 03/30/2016 0856   CREATININE 1.13 03/30/2016 0856      Component Value Date/Time   CALCIUM 9.2 03/30/2016 0856   ALKPHOS 61 03/30/2016 0856   AST 21 03/30/2016 0856   ALT 14 (L) 03/30/2016 0856   BILITOT 0.7 03/30/2016 0856       RADIOGRAPHIC STUDIES: Dg Chest 2 View  Result Date:  04/10/2016 CLINICAL DATA:  Port-A-Cath placement EXAM: FLOURO GUIDE CV LINE - NO REPORT; CHEST - 2 VIEW COMPARISON:  Study obtained earlier in the day FINDINGS: Port-A-Cath tip is in the superior vena cava. No pneumothorax. Scarring in the left base is stable. No new opacity. Heart size and pulmonary vascularity within normal limits. No adenopathy evident. There is degenerative change in the thoracic spine. IMPRESSION: Port-A-Cath tip in superior vena cava. No pneumothorax. Scarring left base. No new opacity. Stable cardiac silhouette. Electronically Signed   By: Lowella Grip III M.D.   On: 04/10/2016 13:48   Dg Chest 2 View  Result Date: 04/10/2016 CLINICAL DATA:  Preoperative evaluation for Port-A-Cath insertion. Hodgkin's lymphoma. EXAM: CHEST  2 VIEW COMPARISON:  March 30, 2016 FINDINGS: There is persistent scarring in the left base region. There is no demonstrable edema or consolidation by radiography. Heart size and pulmonary vascularity normal. No adenopathy evident. There is degenerative change in the thoracic spine. IMPRESSION: No edema or consolidation. No mass or adenopathy appreciable by radiography. There is scarring in the left base. Electronically Signed   By: Lowella Grip III M.D.   On: 04/10/2016 10:45   Dg Chest 2 View  Result Date: 03/30/2016 CLINICAL DATA:  Preoperative lung biopsy EXAM: CHEST  2 VIEW COMPARISON:  None. FINDINGS:  There is mild scarring in the left base region. The lungs elsewhere are clear. Heart size and pulmonary vascularity are normal. No adenopathy. There is atherosclerotic calcification in the aortic arch. There is degenerative change in the thoracic spine. IMPRESSION: Mild scarring left base. No edema or consolidation. Aortic atherosclerosis. No adenopathy evident. Electronically Signed   By: Lowella Grip III M.D.   On: 03/30/2016 09:23   Mr Jeri Cos HQ Contrast  Result Date: 04/04/2016 CLINICAL DATA:  Initial evaluation for metastatic disease.  History of newly diagnosed lung mass. EXAM: MRI HEAD WITHOUT AND WITH CONTRAST TECHNIQUE: Multiplanar, multiecho pulse sequences of the brain and surrounding structures were obtained without and with intravenous contrast. CONTRAST:  73m MULTIHANCE GADOBENATE DIMEGLUMINE 529 MG/ML IV SOLN COMPARISON:  Prior chest CT from 03/12/2016 as well as prior PET-CT from 03/20/2016. FINDINGS: Age appropriate cerebral volume loss present. Patchy and confluent T2/FLAIR hyperintensity within the periventricular and deep white matter both cerebral hemispheres present, nonspecific, but most likely related to chronic small vessel ischemic changes, mild for patient age. No abnormal foci of restricted diffusion to suggest acute ischemia. Gray-white matter differentiation well maintained. Major intracranial vascular flow voids are preserved. No acute or chronic intracranial hemorrhage. No areas of chronic infarction. No mass lesion, midline shift, or mass effect. No hydrocephalus. No extra-axial fluid collection. Major dural sinuses are patent. No abnormal enhancement. No evidence for intracranial metastatic disease. Craniocervical junction within normal limits. Mild degenerative spondylolysis noted at C2-3 without significant stenosis. Remainder the visualized upper cervical spine is unremarkable. Pituitary gland within normal limits. No acute abnormality about the globes and orbits. Mild scattered mucosal thickening within the ethmoid air cells. Paranasal sinuses are otherwise clear. Small left mastoid effusion noted, of doubtful significance. Inner ear structures within normal limits. Bone marrow signal intensity within normal limits. No focal osseous lesions appreciated. No scalp soft tissue abnormality. IMPRESSION: 1. Negative MRI for intracranial metastatic disease or other mass lesion. No other acute intracranial process. 2. Age appropriate cerebral atrophy with mild chronic small vessel ischemic disease. Electronically Signed    By: BJeannine BogaM.D.   On: 04/04/2016 20:30   Nm Pet Image Initial (pi) Skull Base To Thigh  Result Date: 03/20/2016 CLINICAL DATA:  Initial treatment strategy for lung lesion. EXAM: NUCLEAR MEDICINE PET SKULL BASE TO THIGH TECHNIQUE: 10.0 mCi F-18 FDG was injected intravenously. Full-ring PET imaging was performed from the skull base to thigh after the radiotracer. CT data was obtained and used for attenuation correction and anatomic localization. FASTING BLOOD GLUCOSE:  Value: 105 mg/dl COMPARISON:  Chest CT 03/12/2016. FINDINGS: NECK No hypermetabolic lymph nodes in the neck. CHEST Previously noted large pulmonary nodule in the medial aspect of the left lower lobe measures 2.9 x 2.6 cm and is hypermetabolic (SUVmax = 175.9. Several other smaller nodular densities also noted throughout the left lower lobe and lingula are also hypermetabolic. There are numerous extrapleural nodular densities throughout the left hemithorax, presumably extrapleural lymph nodes. These are diffusely hypermetabolic. Specific examples include a hypermetabolic (SUVmax = 116.3 1 cm short axis lymph node (image 91 of series 4) in the posteromedial aspect of the left hemithorax (image 91 of series 4). There are numerous hypermetabolic mediastinal lymph nodes, the largest cluster of which is centered around the mid thoracic esophagus. Although these are difficult to discretely measure they are markedly hypermetabolic (SUVmax = 184.6. Large 18 mm short axis juxta diaphragmatic lymph node noted in the lower middle mediastinum (image 86 of series 4) is  hypermetabolic (SUVmax = 74.2). Superior mediastinal lymph nodes measuring up to 11 mm in short axis immediately anterior to the left common carotid artery origin (image 56 of series 4) are hypermetabolic (SUVmax = 8.8). Right supraclavicular and subpectoral lymphadenopathy is also noted. Nonenlarged but hypermetabolic left internal mammary lymph nodes are also noted (SUVmax = 8.0).  There is aortic atherosclerosis, as well as atherosclerosis of the great vessels of the mediastinum and the coronary arteries, including calcified atherosclerotic plaque in the left main, left anterior descending, left circumflex and right coronary arteries. Calcifications of the aortic valve. Moderate-sized hiatal hernia. Multiple small hypermetabolic retrocrural lymph nodes. ABDOMEN/PELVIS No abnormal hypermetabolic activity within the liver, pancreas, adrenal glands, or spleen. Extensive retroperitoneal hypermetabolic lymphadenopathy, largest of which is a left periaortic node measuring 1.7 cm in short axis (image 126 of series 4) which is hypermetabolic (SUVmax = 59.5). 4 mm nonobstructive calculus in the upper pole collecting system of the right kidney. No ureteral stones or findings of urinary tract obstruction are noted at this time. No pathologic dilatation of small bowel or colon. Numerous colonic diverticulae are noted, without definite surrounding inflammatory changes to suggest an acute diverticulitis at this time. There is a focal area of colonic wall thickening involving the mid sigmoid colon (image 160 of series 4) which demonstrates some low-level hypermetabolic activity (SUVmax = 6.2). SKELETON No focal hypermetabolic activity to suggest skeletal metastasis. IMPRESSION: 1. Previously noted pulmonary nodules are hypermetabolic, with the largest of these in the medial left lower lobe measuring 2.9 x 2.6 cm. This largest nodule in particular is suspicious for a primary bronchogenic neoplasm. The other smaller nodules could be infectious/inflammatory related to postobstructive changes, or could represent metastatic lesions. At this time, there is widespread metastatic lymphadenopathy throughout the mediastinum, extrapleural lymph nodes throughout the left hemithorax, right supraclavicular region, left internal mammary lymph nodes, retrocrural lymph nodes, and retroperitoneal lymph nodes, as detailed  above. 2. Colonic diverticulosis. In the mid sigmoid colon there is a focal area of mural thickening which demonstrates some low-level hypermetabolic activity. This could be related to chronic inflammatory changes from underlying diverticular disease, however, correlation with nonemergent colonoscopy is recommended in the near future to exclude a primary colonic neoplasm. 3. Aortic atherosclerosis, in addition to left main and 3 vessel coronary artery disease. Please note that although the presence of coronary artery calcium documents the presence of coronary artery disease, the severity of this disease and any potential stenosis cannot be assessed on this non-gated CT examination. Assessment for potential risk factor modification, dietary therapy or pharmacologic therapy may be warranted, if clinically indicated. 4. There are calcifications of the aortic valve. Echocardiographic correlation for evaluation of potential valvular dysfunction may be warranted if clinically indicated. 5. Moderate-sized hiatal hernia. 6. Additional incidental findings, as above. Electronically Signed   By: Vinnie Langton M.D.   On: 03/20/2016 15:35  US Biopsy  Result Date: 03/27/2016 INDICATION: 80 year old male with a imaging diagnosis of metastatic disease. FDG positive supraclavicular adenopathy is reasonable site for biopsy. EXAM: ULTRASOUND BIOPSY CORE LIVER MEDICATIONS: None. ANESTHESIA/SEDATION: Moderate (conscious) sedation was employed during this procedure. A total of Versed 2.0 mg and Fentanyl 75 mcg was administered intravenously. Moderate Sedation Time: 20 minutes. The patient's level of consciousness and vital signs were monitored continuously by radiology nursing throughout the procedure under my direct supervision. FLUOROSCOPY TIME:  None COMPLICATIONS: None PROCEDURE: Informed written consent was obtained from the patient after a thorough discussion of the procedural risks, benefits and alternatives. All  questions  were addressed. Maximal Sterile Barrier Technique was utilized including caps, mask, sterile gowns, sterile gloves, sterile drape, hand hygiene and skin antiseptic. A timeout was performed prior to the initiation of the procedure. Patient positioned supine position on the ultrasound stretcher. Images of the right supraclavicular region were stored and sent to PACs. Patient is prepped and draped in usual sterile fashion. The skin and subcutaneous tissues were generously infiltrated 1% lidocaine for local anesthesia. Small stab incision was made with 11 blade scalpel, and using ultrasound guidance, a 17 gauge biopsy gun was advanced into the supraclavicular lymph node of suspicious ultrasound characteristics. Multiple core biopsy were acquired. Tissue wound placed into formalin. Final image was stored. Patient tolerated the procedure well and remained hemodynamically stable throughout. No complications were encountered and no significant blood loss. IMPRESSION: Status post ultrasound-guided biopsy of right supraclavicular lymph node. Tissue specimen sent to pathology for complete histopathologic analysis. Signed, Dulcy Fanny. Earleen Newport, DO [facility] Electronically Signed   By: Corrie Mckusick D.O.   On: 03/27/2016 16:52   Ct Biopsy  Result Date: 04/12/2016 INDICATION: LYMPHOMA EXAM: CT-GUIDED BIOPSY left para-aortic retroperitoneal lymph nodes MEDICATIONS: None. ANESTHESIA/SEDATION: 2.5 mg IV Versed; 25 mcg IV Fentanyl Moderate Sedation Time:  16 minutes The patient was continuously monitored during the procedure by the interventional radiology nurse under my direct supervision. PROCEDURE: The procedure, risks, benefits, and alternatives were explained to the patient. Questions regarding the procedure were encouraged and answered. The patient understands and consents to the procedure. Previous imaging reviewed. Patient positioned prone. Noncontrast localization  CT performed. The left para-aortic retroperitoneal adenopathy was localized. The right posterior back was prepped with ChloraPrep in a sterile fashion, and a sterile drape was applied covering the operative field. A sterile gown and sterile gloves were used for the procedure. Under CT guidance, a(n) 17 gauge guide needle was advanced into the left periaortic retroperitoneal adenopathy. Several 18 gauge core biopsies were attempted. Sampling was fragmented. Samples were placed in saline. The guide needle was removed. Final imaging was performed. Patient tolerated the procedure well without complication. Vital sign monitoring by nursing staff during the procedure will continue as patient is in the special procedures unit for post procedure observation. FINDINGS: The images document guide needle placement within the left periaortic retroperitoneal adenopathy. Post biopsy images demonstrate significant hemorrhage or complication. COMPLICATIONS: None immediate. IMPRESSION: Successful CT-guided left para-aortic retroperitoneal adenopathy Electronically Signed   By: Jerilynn Mages.  Shick M.D.   On: 04/12/2016 11:41   Ct Biopsy  Result Date: 04/12/2016 INDICATION: Lymphoma EXAM: CT GUIDED RIGHT ILIAC BONE MARROW ASPIRATION AND CORE BIOPSY Date:  8/17/20178/17/2017 10:10 am Radiologist:  M. Daryll Brod, MD Guidance:  CT FLUOROSCOPY TIME:  Fluoroscopy Time: NONE. MEDICATIONS: None. ANESTHESIA/SEDATION: 2.5 mg IV Versed; 25 mcg IV Fentanyl Moderate Sedation Time:  15 The patient was continuously monitored during the procedure by the interventional radiology nurse under my direct supervision. CONTRAST:  NONE. COMPLICATIONS: None PROCEDURE: Informed consent was obtained from the patient following explanation of the procedure, risks, benefits and alternatives. The patient understands, agrees and consents for the procedure. All questions were addressed. A time out was performed. The patient was positioned prone and non-contrast  localization CT was performed of the pelvis to demonstrate the iliac marrow spaces. Maximal barrier sterile technique utilized including caps, mask, sterile gowns, sterile gloves, large sterile drape, hand hygiene, and Betadine prep. Under sterile conditions and local anesthesia, an 11 gauge coaxial bone biopsy needle was advanced  into the RIGHT iliac marrow space. Needle position was confirmed with CT imaging. Initially, bone marrow aspiration was performed. Next, the 11 gauge outer cannula was utilized to obtain a RIGHT iliac bone marrow core biopsy. Needle was removed. Hemostasis was obtained with compression. The patient tolerated the procedure well. Samples were prepared with the cytotechnologist. No immediate complications. IMPRESSION: CT guided RIGHT iliac bone marrow aspiration and core biopsy. Electronically Signed   By: Jerilynn Mages.  Shick M.D.   On: 04/12/2016 11:38   Dg Fluoro Guide Cv Line-no Report  Result Date: 04/10/2016 CLINICAL DATA:  FLOURO GUIDE CV LINE Fluoroscopy was utilized by the requesting physician.  No radiographic interpretation.    ASSESSMENT AND PLAN: This is a very pleasant 80 years old white male with recently diagnosed at least a stage II large B-cell non-Hodgkin lymphoma involving the lymph nodes in the chest and abdomen. There is no involvement of the bone marrow or the liver or spleen. I had a lengthy discussion with the patient and his wife about his current condition and treatment options. I recommended for the patient treatment with systemic chemotherapy with CHOP/Rituxan with Neulasta support every 3 weeks for a total of 6-8 cycles. I discussed with the patient adverse effect of this treatment including but not limited to alopecia, myelosuppression, nausea and vomiting, peripheral neuropathy, liver or renal dysfunction. I will arrange for the patient to have a chemotherapy education class this week before starting his next dose of the treatment next week. I will call his  pharmacy with several prescriptions including allopurinol 100 mg by mouth twice a day for tumor lysis prophylaxis, Compazine 10 mg by mouth every 6 hours as needed for nausea, prednisone 100 mg by mouth daily for 5 days a starting with the first dose of his chemotherapy every 3 weeks. I will also call his pharmacy with Emla cream to be applied to the Port-A-Cath site before treatment. The patient would come back for follow-up visit in 2 weeks for evaluation and management of any adverse effect of his treatment. There is advised to call immediately if he has any concerning symptoms in the interval. The patient voices understanding of current disease status and treatment options and is in agreement with the current care plan.  All questions were answered. The patient knows to call the clinic with any problems, questions or concerns. We can certainly see the patient much sooner if necessary.  I spent 20 minutes counseling the patient face to face. The total time spent in the appointment was 30 minutes.  Disclaimer: This note was dictated with voice recognition software. Similar sounding words can inadvertently be transcribed and may not be corrected upon review.

## 2016-04-18 LAB — BETA 2 MICROGLOBULIN, SERUM: BETA 2: 2.1 mg/L (ref 0.6–2.4)

## 2016-04-18 LAB — HEPATITIS PANEL, ACUTE
HBsAg Screen: NEGATIVE
HEP B C IGM: NEGATIVE
Hep A Ab, IgM: NEGATIVE

## 2016-04-19 ENCOUNTER — Encounter: Payer: Self-pay | Admitting: Internal Medicine

## 2016-04-20 ENCOUNTER — Encounter: Payer: Self-pay | Admitting: *Deleted

## 2016-04-20 LAB — CHROMOSOME ANALYSIS, BONE MARROW

## 2016-04-20 NOTE — Progress Notes (Signed)
Oncology Nurse Navigator Documentation  Oncology Nurse Navigator Flowsheets 04/20/2016  Navigator Encounter Type Other/patient called and spoke to [facility].  He states he is to start chemo on Tuesday but doesn't have an appt yet.  Stanton Kidney is not sure that chemo has been authorized.  I called chemo per cert team member, Lendell Caprice.  I left her a vm message with patient information and the need for pre cert.  I asked that she call me with my name and phone number.   Treatment Phase Pre-Tx/Tx Discussion  Barriers/Navigation Needs Coordination of Care  Interventions Coordination of Care  Coordination of Care Other  Acuity Level 2  Acuity Level 2 Other  Time Spent with Patient 15

## 2016-04-21 ENCOUNTER — Telehealth: Payer: Self-pay | Admitting: Internal Medicine

## 2016-04-21 NOTE — Telephone Encounter (Signed)
SPOKE WITH PATIENT RE NEXT APPOINTMENTS FOR 8/28 AND 8/29. PATIENT WILL GET NEW SCHEDULE 8/28.

## 2016-04-23 ENCOUNTER — Telehealth: Payer: Self-pay | Admitting: *Deleted

## 2016-04-23 ENCOUNTER — Ambulatory Visit (HOSPITAL_COMMUNITY)
Admission: RE | Admit: 2016-04-23 | Discharge: 2016-04-23 | Disposition: A | Discharge status: Home / Self Care | Payer: Medicare Other | Source: Ambulatory Visit | Attending: Internal Medicine | Admitting: Internal Medicine

## 2016-04-23 ENCOUNTER — Encounter: Payer: Self-pay | Admitting: General Practice

## 2016-04-23 ENCOUNTER — Other Ambulatory Visit: Payer: Medicare Other

## 2016-04-23 DIAGNOSIS — C8338 Diffuse large B-cell lymphoma, lymph nodes of multiple sites: Secondary | ICD-10-CM | POA: Diagnosis not present

## 2016-04-23 DIAGNOSIS — I358 Other nonrheumatic aortic valve disorders: Secondary | ICD-10-CM | POA: Diagnosis not present

## 2016-04-23 DIAGNOSIS — Z09 Encounter for follow-up examination after completed treatment for conditions other than malignant neoplasm: Secondary | ICD-10-CM | POA: Diagnosis present

## 2016-04-23 DIAGNOSIS — Z01810 Encounter for preprocedural cardiovascular examination: Secondary | ICD-10-CM | POA: Diagnosis not present

## 2016-04-23 NOTE — Telephone Encounter (Signed)
Spoke to the patient and explained that the treatment has not been approved. Patient ok with appts being moved

## 2016-04-23 NOTE — Progress Notes (Signed)
  Echocardiogram 2D Echocardiogram limited has been performed.  Tresa Res 04/23/2016, 2:53 PM

## 2016-04-23 NOTE — Progress Notes (Signed)
[place] [facility]ual [facility] Note  Met pt in chemo education class this morning to introduce Support Center team/resources.  Normalized distress at dx/tx process, providing emotional support and encouraging for [facility] additional layers of support.  Pt/family appeared receptive, appreciative, and curious.  1:1 time was limited in class environment, so plan to f/u with handwritten note of encouragement, but please also page if needs arise or circumstances change.  Thank you.  [place], [place], [facility] Pager [phone] Voicemail [phone]

## 2016-04-23 NOTE — Telephone Encounter (Signed)
Treatment not authorized. I have moved appts from 8/29 to 8/30. Called and left a message on voicemail for patient

## 2016-04-24 ENCOUNTER — Other Ambulatory Visit: Payer: Medicare Other

## 2016-04-24 ENCOUNTER — Ambulatory Visit: Payer: Medicare Other

## 2016-04-24 ENCOUNTER — Encounter: Payer: Self-pay | Admitting: General Practice

## 2016-04-24 NOTE — Progress Notes (Signed)
[facility] Spiritual Care Note  Mailed handwritten note of support, encouraging pt/family to reach out anytime with questions or to meet for additional emotional support.  Please also page if needs arise or circumstances change.  Thank you.  [place], [place], [facility] Pager [phone] Voicemail [phone]

## 2016-04-25 ENCOUNTER — Ambulatory Visit (HOSPITAL_BASED_OUTPATIENT_CLINIC_OR_DEPARTMENT_OTHER): Payer: Medicare Other

## 2016-04-25 ENCOUNTER — Other Ambulatory Visit (HOSPITAL_BASED_OUTPATIENT_CLINIC_OR_DEPARTMENT_OTHER): Payer: Medicare Other

## 2016-04-25 VITALS — BP 103/52 | HR 77 | Temp 98.5°F | Resp 18

## 2016-04-25 DIAGNOSIS — Z5189 Encounter for other specified aftercare: Secondary | ICD-10-CM | POA: Diagnosis not present

## 2016-04-25 DIAGNOSIS — C8338 Diffuse large B-cell lymphoma, lymph nodes of multiple sites: Secondary | ICD-10-CM

## 2016-04-25 DIAGNOSIS — Z5112 Encounter for antineoplastic immunotherapy: Secondary | ICD-10-CM

## 2016-04-25 DIAGNOSIS — Z5111 Encounter for antineoplastic chemotherapy: Secondary | ICD-10-CM | POA: Diagnosis not present

## 2016-04-25 LAB — CBC WITH DIFFERENTIAL/PLATELET
BASO%: 0.5 % (ref 0.0–2.0)
BASOS ABS: 0 10*3/uL (ref 0.0–0.1)
EOS ABS: 0.1 10*3/uL (ref 0.0–0.5)
EOS%: 2 % (ref 0.0–7.0)
HCT: 44.6 % (ref 38.4–49.9)
HGB: 14.7 g/dL (ref 13.0–17.1)
LYMPH%: 18.2 % (ref 14.0–49.0)
MCH: 32 pg (ref 27.2–33.4)
MCHC: 32.9 g/dL (ref 32.0–36.0)
MCV: 97.1 fL (ref 79.3–98.0)
MONO#: 0.6 10*3/uL (ref 0.1–0.9)
MONO%: 8 % (ref 0.0–14.0)
NEUT#: 5.3 10*3/uL (ref 1.5–6.5)
NEUT%: 71.3 % (ref 39.0–75.0)
Platelets: 201 10*3/uL (ref 140–400)
RBC: 4.59 10*6/uL (ref 4.20–5.82)
RDW: 13.5 % (ref 11.0–14.6)
WBC: 7.4 10*3/uL (ref 4.0–10.3)
lymph#: 1.3 10*3/uL (ref 0.9–3.3)

## 2016-04-25 LAB — COMPREHENSIVE METABOLIC PANEL
ALT: 15 U/L (ref 0–55)
ANION GAP: 11 meq/L (ref 3–11)
AST: 20 U/L (ref 5–34)
Albumin: 3.6 g/dL (ref 3.5–5.0)
Alkaline Phosphatase: 81 U/L (ref 40–150)
BILIRUBIN TOTAL: 0.66 mg/dL (ref 0.20–1.20)
BUN: 17.2 mg/dL (ref 7.0–26.0)
CO2: 25 meq/L (ref 22–29)
CREATININE: 1.2 mg/dL (ref 0.7–1.3)
Calcium: 9.6 mg/dL (ref 8.4–10.4)
Chloride: 107 mEq/L (ref 98–109)
EGFR: 56 mL/min/{1.73_m2} — ABNORMAL LOW (ref >90)
GLUCOSE: 90 mg/dL (ref 70–140)
Potassium: 4.1 mEq/L (ref 3.5–5.1)
SODIUM: 144 meq/L (ref 136–145)
TOTAL PROTEIN: 7 g/dL (ref 6.4–8.3)

## 2016-04-25 LAB — URIC ACID: Uric Acid, Serum: 6.3 mg/dl (ref 2.6–7.4)

## 2016-04-25 LAB — LACTATE DEHYDROGENASE: LDH: 197 U/L (ref 125–245)

## 2016-04-25 MED ORDER — ACETAMINOPHEN 325 MG PO TABS
ORAL_TABLET | ORAL | Status: AC
  Filled 2016-04-25: qty 2

## 2016-04-25 MED ORDER — DOXORUBICIN HCL CHEMO IV INJECTION 2 MG/ML
50.0000 mg/m2 | Freq: Once | INTRAVENOUS | Status: AC
  Administered 2016-04-25: 104 mg via INTRAVENOUS
  Filled 2016-04-25: qty 52

## 2016-04-25 MED ORDER — SODIUM CHLORIDE 0.9 % IV SOLN
Freq: Once | INTRAVENOUS | Status: AC
  Administered 2016-04-25: 09:00:00 via INTRAVENOUS

## 2016-04-25 MED ORDER — PALONOSETRON HCL INJECTION 0.25 MG/5ML
0.2500 mg | Freq: Once | INTRAVENOUS | Status: AC
  Administered 2016-04-25: 0.25 mg via INTRAVENOUS

## 2016-04-25 MED ORDER — PEGFILGRASTIM 6 MG/0.6ML ~~LOC~~ PSKT
6.0000 mg | PREFILLED_SYRINGE | Freq: Once | SUBCUTANEOUS | Status: AC
  Administered 2016-04-25: 6 mg via SUBCUTANEOUS
  Filled 2016-04-25: qty 0.6

## 2016-04-25 MED ORDER — HEPARIN SOD (PORK) LOCK FLUSH 100 UNIT/ML IV SOLN
500.0000 [IU] | Freq: Once | INTRAVENOUS | Status: AC | PRN
  Administered 2016-04-25: 500 [IU]
  Filled 2016-04-25: qty 5

## 2016-04-25 MED ORDER — ACETAMINOPHEN 325 MG PO TABS
650.0000 mg | ORAL_TABLET | Freq: Once | ORAL | Status: AC
  Administered 2016-04-25: 650 mg via ORAL

## 2016-04-25 MED ORDER — SODIUM CHLORIDE 0.9% FLUSH
10.0000 mL | INTRAVENOUS | Status: DC | PRN
  Administered 2016-04-25: 10 mL
  Filled 2016-04-25: qty 10

## 2016-04-25 MED ORDER — CYCLOPHOSPHAMIDE CHEMO INJECTION 1 GM
750.0000 mg/m2 | Freq: Once | INTRAMUSCULAR | Status: AC
  Administered 2016-04-25: 1560 mg via INTRAVENOUS
  Filled 2016-04-25: qty 78

## 2016-04-25 MED ORDER — VINCRISTINE SULFATE CHEMO INJECTION 1 MG/ML
2.0000 mg | Freq: Once | INTRAVENOUS | Status: AC
  Administered 2016-04-25: 2 mg via INTRAVENOUS
  Filled 2016-04-25: qty 2

## 2016-04-25 MED ORDER — DIPHENHYDRAMINE HCL 25 MG PO CAPS
50.0000 mg | ORAL_CAPSULE | Freq: Once | ORAL | Status: AC
  Administered 2016-04-25: 50 mg via ORAL

## 2016-04-25 MED ORDER — SODIUM CHLORIDE 0.9 % IV SOLN
10.0000 mg | Freq: Once | INTRAVENOUS | Status: AC
  Administered 2016-04-25: 10 mg via INTRAVENOUS
  Filled 2016-04-25: qty 1

## 2016-04-25 MED ORDER — DIPHENHYDRAMINE HCL 25 MG PO CAPS
ORAL_CAPSULE | ORAL | Status: AC
  Filled 2016-04-25: qty 2

## 2016-04-25 MED ORDER — SODIUM CHLORIDE 0.9 % IV SOLN
375.0000 mg/m2 | Freq: Once | INTRAVENOUS | Status: AC
  Administered 2016-04-25: 800 mg via INTRAVENOUS
  Filled 2016-04-25: qty 50

## 2016-04-25 MED ORDER — PALONOSETRON HCL INJECTION 0.25 MG/5ML
INTRAVENOUS | Status: AC
  Filled 2016-04-25: qty 5

## 2016-04-25 NOTE — Patient Instructions (Addendum)
[facility] Discharge Instructions for Patients Receiving Chemotherapy  Today you received the following chemotherapy agents Adriamycin, Oncovin, Cytoxan, and Rituxan.  To help prevent nausea and vomiting after your treatment, we encourage you to take your nausea medication as prescribed by your doctor.   If you develop nausea and vomiting that is not controlled by your nausea medication, call the clinic.   BELOW ARE SYMPTOMS THAT SHOULD BE REPORTED IMMEDIATELY:  *FEVER GREATER THAN 100.5 F  *CHILLS WITH OR WITHOUT FEVER  NAUSEA AND VOMITING THAT IS NOT CONTROLLED WITH YOUR NAUSEA MEDICATION  *UNUSUAL SHORTNESS OF BREATH  *UNUSUAL BRUISING OR BLEEDING  TENDERNESS IN MOUTH AND THROAT WITH OR WITHOUT PRESENCE OF ULCERS  *URINARY PROBLEMS  *BOWEL PROBLEMS  UNUSUAL RASH Items with * indicate a potential emergency and should be followed up as soon as possible.  Feel free to call the clinic you have any questions or concerns. The clinic phone number is [phone].  Please show the [facility] at check-in to the Emergency Department and triage nurse.    Doxorubicin injection What is this medicine? DOXORUBICIN (dox oh ROO bi sin) is a chemotherapy drug. It is used to treat many kinds of cancer like Hodgkin's disease, leukemia, non-Hodgkin's lymphoma, neuroblastoma, sarcoma, and Wilms' tumor. It is also used to treat bladder cancer, breast cancer, lung cancer, ovarian cancer, stomach cancer, and thyroid cancer. This medicine may be used for other purposes; ask your health care provider or pharmacist if you have questions. What should I tell my health care provider before I take this medicine? They need to know if you have any of these conditions: -blood disorders -heart disease, recent heart attack -infection (especially a virus infection such as chickenpox, cold sores, or herpes) -irregular heartbeat -liver disease -recent or ongoing radiation  therapy -an unusual or allergic reaction to doxorubicin, other chemotherapy agents, other medicines, foods, dyes, or preservatives -pregnant or trying to get pregnant -breast-feeding How should I use this medicine? This drug is given as an infusion into a vein. It is administered in a hospital or clinic by a specially trained health care professional. If you have pain, swelling, burning or any unusual feeling around the site of your injection, tell your health care professional right away. Talk to your pediatrician regarding the use of this medicine in children. Special care may be needed. Overdosage: If you think you have taken too much of this medicine contact a poison control center or emergency room at once. NOTE: This medicine is only for you. Do not share this medicine with others. What if I miss a dose? It is important not to miss your dose. Call your doctor or health care professional if you are unable to keep an appointment. What may interact with this medicine? Do not take this medicine with any of the following medications: -cisapride -droperidol -halofantrine -pimozide -zidovudine This medicine may also interact with the following medications: -chloroquine -chlorpromazine -clarithromycin -cyclophosphamide -cyclosporine -erythromycin -medicines for depression, anxiety, or psychotic disturbances -medicines for irregular heart beat like amiodarone, bepridil, dofetilide, encainide, flecainide, propafenone, quinidine -medicines for seizures like ethotoin, fosphenytoin, phenytoin -medicines for nausea, vomiting like dolasetron, ondansetron, palonosetron -medicines to increase blood counts like filgrastim, pegfilgrastim, sargramostim -methadone -methotrexate -pentamidine -progesterone -vaccines -verapamil Talk to your doctor or health care professional before taking any of these medicines: -acetaminophen -aspirin -ibuprofen -ketoprofen -naproxen This list may not  describe all possible interactions. Give your health care provider a list of all the medicines, herbs, non-prescription drugs,  or dietary supplements you use. Also tell them if you smoke, drink alcohol, or use illegal drugs. Some items may interact with your medicine. What should I watch for while using this medicine? Your condition will be monitored carefully while you are receiving this medicine. You will need important blood work done while you are taking this medicine. This drug may make you feel generally unwell. This is not uncommon, as chemotherapy can affect healthy cells as well as cancer cells. Report any side effects. Continue your course of treatment even though you feel ill unless your doctor tells you to stop. Your urine may turn red for a few days after your dose. This is not blood. If your urine is dark or brown, call your doctor. In some cases, you may be given additional medicines to help with side effects. Follow all directions for their use. Call your doctor or health care professional for advice if you get a fever, chills or sore throat, or other symptoms of a cold or flu. Do not treat yourself. This drug decreases your body's ability to fight infections. Try to avoid being around people who are sick. This medicine may increase your risk to bruise or bleed. Call your doctor or health care professional if you notice any unusual bleeding. Be careful brushing and flossing your teeth or using a toothpick because you may get an infection or bleed more easily. If you have any dental work done, tell your dentist you are receiving this medicine. Avoid taking products that contain aspirin, acetaminophen, ibuprofen, naproxen, or ketoprofen unless instructed by your doctor. These medicines may hide a fever. Men and women of childbearing age should use effective birth control methods while using taking this medicine. Do not become pregnant while taking this medicine. There is a potential for  serious side effects to an unborn child. Talk to your health care professional or pharmacist for more information. Do not breast-feed an infant while taking this medicine. Do not let others touch your urine or other body fluids for 5 days after each treatment with this medicine. Caregivers should wear latex gloves to avoid touching body fluids during this time. There is a maximum amount of this medicine you should receive throughout your life. The amount depends on the medical condition being treated and your overall health. Your doctor will watch how much of this medicine you receive in your lifetime. Tell your doctor if you have taken this medicine before. What side effects may I notice from receiving this medicine? Side effects that you should report to your doctor or health care professional as soon as possible: -allergic reactions like skin rash, itching or hives, swelling of the face, lips, or tongue -low blood counts - this medicine may decrease the number of white blood cells, red blood cells and platelets. You may be at increased risk for infections and bleeding. -signs of infection - fever or chills, cough, sore throat, pain or difficulty passing urine -signs of decreased platelets or bleeding - bruising, pinpoint red spots on the skin, black, tarry stools, blood in the urine -signs of decreased red blood cells - unusually weak or tired, fainting spells, lightheadedness -breathing problems -chest pain -fast, irregular heartbeat -mouth sores -nausea, vomiting -pain, swelling, redness at site where injected -pain, tingling, numbness in the hands or feet -swelling of ankles, feet, or hands -unusual bleeding or bruising Side effects that usually do not require medical attention (report to your doctor or health care professional if they continue or are bothersome): -diarrhea -  facial flushing -hair loss -loss of appetite -missed menstrual periods -nail discoloration or damage -red or  watery eyes -red colored urine -stomach upset This list may not describe all possible side effects. Call your doctor for medical advice about side effects. You may report side effects to FDA at 1-800-FDA-1088. Where should I keep my medicine? This drug is given in a hospital or clinic and will not be stored at home. NOTE: This sheet is a summary. It may not cover all possible information. If you have questions about this medicine, talk to your doctor, pharmacist, or health care provider.    2016, Elsevier/[employer] Standard. (2012-12-09 09:54:34)  Vincristine injection What is this medicine? VINCRISTINE (vin KRIS teen) is a chemotherapy drug. It slows the growth of cancer cells. This medicine is used to treat many types of cancer like Hodgkin's disease, leukemia, non-Hodgkin's lymphoma, neuroblastoma (brain cancer), rhabdomyosarcoma, and Wilms' tumor. This medicine may be used for other purposes; ask your health care provider or pharmacist if you have questions. What should I tell my health care provider before I take this medicine? They need to know if you have any of these conditions: -blood disorders -gout -infection (especially chickenpox, cold sores, or herpes) -kidney disease -liver disease -lung disease -nervous system disease like Charcot-Marie-Tooth (CMT) -recent or ongoing radiation therapy -an unusual or allergic reaction to vincristine, other chemotherapy agents, other medicines, foods, dyes, or preservatives -pregnant or trying to get pregnant -breast-feeding How should I use this medicine? This drug is given as an infusion into a vein. It is administered in a hospital or clinic by a specially trained health care professional. If you have pain, swelling, burning, or any unusual feeling around the site of your injection, tell your health care professional right away. Talk to your pediatrician regarding the use of this medicine in children. While this drug may be prescribed for  selected conditions, precautions do apply. Overdosage: If you think you have taken too much of this medicine contact a poison control center or emergency room at once. NOTE: This medicine is only for you. Do not share this medicine with others. What if I miss a dose? It is important not to miss your dose. Call your doctor or health care professional if you are unable to keep an appointment. What may interact with this medicine? Do not take this medicine with any of the following medications: -itraconazole -mibefradil -voriconazole This medicine may also interact with the following medications: -cyclosporine -erythromycin -fluconazole -ketoconazole -medicines for HIV like delavirdine, efavirenz, nevirapine -medicines for seizures like ethotoin, fosphenotoin, phenytoin -medicines to increase blood counts like filgrastim, pegfilgrastim, sargramostim -other chemotherapy drugs like cisplatin, L-asparaginase, methotrexate, mitomycin, paclitaxel -pegaspargase -vaccines -zalcitabine, ddC Talk to your doctor or health care professional before taking any of these medicines: -acetaminophen -aspirin -ibuprofen -ketoprofen -naproxen This list may not describe all possible interactions. Give your health care provider a list of all the medicines, herbs, non-prescription drugs, or dietary supplements you use. Also tell them if you smoke, drink alcohol, or use illegal drugs. Some items may interact with your medicine. What should I watch for while using this medicine? Your condition will be monitored carefully while you are receiving this medicine. You will need important blood work done while you are taking this medicine. This drug may make you feel generally unwell. This is not uncommon, as chemotherapy can affect healthy cells as well as cancer cells. Report any side effects. Continue your course of treatment even though you feel ill unless your doctor  tells you to stop. In some cases, you may be  given additional medicines to help with side effects. Follow all directions for their use. Call your doctor or health care professional for advice if you get a fever, chills or sore throat, or other symptoms of a cold or flu. Do not treat yourself. Avoid taking products that contain aspirin, acetaminophen, ibuprofen, naproxen, or ketoprofen unless instructed by your doctor. These medicines may hide a fever. Do not become pregnant while taking this medicine. Women should inform their doctor if they wish to become pregnant or think they might be pregnant. There is a potential for serious side effects to an unborn child. Talk to your health care professional or pharmacist for more information. Do not breast-feed an infant while taking this medicine. Men may have a lower sperm count while taking this medicine. Talk to your doctor if you plan to father a child. What side effects may I notice from receiving this medicine? Side effects that you should report to your doctor or health care professional as soon as possible: -allergic reactions like skin rash, itching or hives, swelling of the face, lips, or tongue -breathing problems -confusion or changes in emotions or moods -constipation -cough -mouth sores -muscle weakness -nausea and vomiting -pain, swelling, redness or irritation at the injection site -pain, tingling, numbness in the hands or feet -problems with balance, talking, walking -seizures -stomach pain -trouble passing urine or change in the amount of urine Side effects that usually do not require medical attention (report to your doctor or health care professional if they continue or are bothersome): -diarrhea -hair loss -jaw pain -loss of appetite This list may not describe all possible side effects. Call your doctor for medical advice about side effects. You may report side effects to FDA at 1-800-FDA-1088. Where should I keep my medicine? This drug is given in a hospital or clinic  and will not be stored at home. NOTE: This sheet is a summary. It may not cover all possible information. If you have questions about this medicine, talk to your doctor, pharmacist, or health care provider.    2016, [employer]/Gold Standard. (2008-05-10 17:17:13)  Cyclophosphamide injection What is this medicine? CYCLOPHOSPHAMIDE (sye kloe FOSS fa mide) is a chemotherapy drug. It slows the growth of cancer cells. This medicine is used to treat many types of cancer like lymphoma, myeloma, leukemia, breast cancer, and ovarian cancer, to name a few. This medicine may be used for other purposes; ask your health care provider or pharmacist if you have questions. What should I tell my health care provider before I take this medicine? They need to know if you have any of these conditions: -blood disorders -history of other chemotherapy -infection -kidney disease -liver disease -recent or ongoing radiation therapy -tumors in the bone marrow -an unusual or allergic reaction to cyclophosphamide, other chemotherapy, other medicines, foods, dyes, or preservatives -pregnant or trying to get pregnant -breast-feeding How should I use this medicine? This drug is usually given as an injection into a vein or muscle or by infusion into a vein. It is administered in a hospital or clinic by a specially trained health care professional. Talk to your pediatrician regarding the use of this medicine in children. Special care may be needed. Overdosage: If you think you have taken too much of this medicine contact a poison control center or emergency room at once. NOTE: This medicine is only for you. Do not share this medicine with others. What if I miss a  dose? It is important not to miss your dose. Call your doctor or health care professional if you are unable to keep an appointment. What may interact with this medicine? This medicine may interact with the following medications: -amiodarone -amphotericin  B -azathioprine -certain antiviral medicines for HIV or AIDS such as protease inhibitors (e.g., indinavir, ritonavir) and zidovudine -certain blood pressure medications such as benazepril, captopril, enalapril, fosinopril, lisinopril, moexipril, monopril, perindopril, quinapril, ramipril, trandolapril -certain cancer medications such as anthracyclines (e.g., daunorubicin, doxorubicin), busulfan, cytarabine, paclitaxel, pentostatin, tamoxifen, trastuzumab -certain diuretics such as chlorothiazide, chlorthalidone, hydrochlorothiazide, indapamide, metolazone -certain medicines that treat or prevent blood clots like warfarin -certain muscle relaxants such as succinylcholine -cyclosporine -etanercept -indomethacin -medicines to increase blood counts like filgrastim, pegfilgrastim, sargramostim -medicines used as general anesthesia -metronidazole -natalizumab This list may not describe all possible interactions. Give your health care provider a list of all the medicines, herbs, non-prescription drugs, or dietary supplements you use. Also tell them if you smoke, drink alcohol, or use illegal drugs. Some items may interact with your medicine. What should I watch for while using this medicine? Visit your doctor for checks on your progress. This drug may make you feel generally unwell. This is not uncommon, as chemotherapy can affect healthy cells as well as cancer cells. Report any side effects. Continue your course of treatment even though you feel ill unless your doctor tells you to stop. Drink water or other fluids as directed. Urinate often, even at night. In some cases, you may be given additional medicines to help with side effects. Follow all directions for their use. Call your doctor or health care professional for advice if you get a fever, chills or sore throat, or other symptoms of a cold or flu. Do not treat yourself. This drug decreases your body's ability to fight infections. Try to avoid  being around people who are sick. This medicine may increase your risk to bruise or bleed. Call your doctor or health care professional if you notice any unusual bleeding. Be careful brushing and flossing your teeth or using a toothpick because you may get an infection or bleed more easily. If you have any dental work done, tell your dentist you are receiving this medicine. You may get drowsy or dizzy. Do not drive, use machinery, or do anything that needs mental alertness until you know how this medicine affects you. Do not become pregnant while taking this medicine or for 1 year after stopping it. Women should inform their doctor if they wish to become pregnant or think they might be pregnant. Men should not father a child while taking this medicine and for 4 months after stopping it. There is a potential for serious side effects to an unborn child. Talk to your health care professional or pharmacist for more information. Do not breast-feed an infant while taking this medicine. This medicine may interfere with the ability to have a child. This medicine has caused ovarian failure in some women. This medicine has caused reduced sperm counts in some men. You should talk with your doctor or health care professional if you are concerned about your fertility. If you are going to have surgery, tell your doctor or health care professional that you have taken this medicine. What side effects may I notice from receiving this medicine? Side effects that you should report to your doctor or health care professional as soon as possible: -allergic reactions like skin rash, itching or hives, swelling of the face, lips, or tongue -low blood  counts - this medicine may decrease the number of white blood cells, red blood cells and platelets. You may be at increased risk for infections and bleeding. -signs of infection - fever or chills, cough, sore throat, pain or difficulty passing urine -signs of decreased platelets or  bleeding - bruising, pinpoint red spots on the skin, black, tarry stools, blood in the urine -signs of decreased red blood cells - unusually weak or tired, fainting spells, lightheadedness -breathing problems -dark urine -dizziness -palpitations -swelling of the ankles, feet, hands -trouble passing urine or change in the amount of urine -weight gain -yellowing of the eyes or skin Side effects that usually do not require medical attention (report to your doctor or health care professional if they continue or are bothersome): -changes in nail or skin color -hair loss -missed menstrual periods -mouth sores -nausea, vomiting This list may not describe all possible side effects. Call your doctor for medical advice about side effects. You may report side effects to FDA at 1-800-FDA-1088. Where should I keep my medicine? This drug is given in a hospital or clinic and will not be stored at home. NOTE: This sheet is a summary. It may not cover all possible information. If you have questions about this medicine, talk to your doctor, pharmacist, or health care provider.    2016, [employer] Standard. (2012-06-27 16:22:58)  Rituximab injection What is this medicine? RITUXIMAB (ri TUX i mab) is a monoclonal antibody. It is used commonly to treat non-Hodgkin lymphoma and other conditions. It is also used to treat rheumatoid arthritis (RA). In RA, this medicine slows the inflammatory process and help reduce joint pain and swelling. This medicine is often used with other cancer or arthritis medications. This medicine may be used for other purposes; ask your health care provider or pharmacist if you have questions. What should I tell my health care provider before I take this medicine? They need to know if you have any of these conditions: -blood disorders -heart disease -history of hepatitis B -infection (especially a virus infection such as chickenpox, cold sores, or herpes) -irregular  heartbeat -kidney disease -lung or breathing disease, like asthma -lupus -an unusual or allergic reaction to rituximab, mouse proteins, other medicines, foods, dyes, or preservatives -pregnant or trying to get pregnant -breast-feeding How should I use this medicine? This medicine is for infusion into a vein. It is administered in a hospital or clinic by a specially trained health care professional. A special MedGuide will be given to you by the pharmacist with each prescription and refill. Be sure to read this information carefully each time. Talk to your pediatrician regarding the use of this medicine in children. This medicine is not approved for use in children. Overdosage: If you think you have taken too much of this medicine contact a poison control center or emergency room at once. NOTE: This medicine is only for you. Do not share this medicine with others. What if I miss a dose? It is important not to miss a dose. Call your doctor or health care professional if you are unable to keep an appointment. What may interact with this medicine? -cisplatin -medicines for blood pressure -some other medicines for arthritis -vaccines This list may not describe all possible interactions. Give your health care provider a list of all the medicines, herbs, non-prescription drugs, or dietary supplements you use. Also tell them if you smoke, drink alcohol, or use illegal drugs. Some items may interact with your medicine. What should I watch for while  using this medicine? Report any side effects that you notice during your treatment right away, such as changes in your breathing, fever, chills, dizziness or lightheadedness. These effects are more common with the first dose. Visit your prescriber or health care professional for checks on your progress. You will need to have regular blood work. Report any other side effects. The side effects of this medicine can continue after you finish your treatment.  Continue your course of treatment even though you feel ill unless your doctor tells you to stop. Call your doctor or health care professional for advice if you get a fever, chills or sore throat, or other symptoms of a cold or flu. Do not treat yourself. This drug decreases your body's ability to fight infections. Try to avoid being around people who are sick. This medicine may increase your risk to bruise or bleed. Call your doctor or health care professional if you notice any unusual bleeding. Be careful brushing and flossing your teeth or using a toothpick because you may get an infection or bleed more easily. If you have any dental work done, tell your dentist you are receiving this medicine. Avoid taking products that contain aspirin, acetaminophen, ibuprofen, naproxen, or ketoprofen unless instructed by your doctor. These medicines may hide a fever. Do not become pregnant while taking this medicine. Women should inform their doctor if they wish to become pregnant or think they might be pregnant. There is a potential for serious side effects to an unborn child. Talk to your health care professional or pharmacist for more information. Do not breast-feed an infant while taking this medicine. What side effects may I notice from receiving this medicine? Side effects that you should report to your doctor or health care professional as soon as possible: -allergic reactions like skin rash, itching or hives, swelling of the face, lips, or tongue -low blood counts - this medicine may decrease the number of white blood cells, red blood cells and platelets. You may be at increased risk for infections and bleeding. -signs of infection - fever or chills, cough, sore throat, pain or difficulty passing urine -signs of decreased platelets or bleeding - bruising, pinpoint red spots on the skin, black, tarry stools, blood in the urine -signs of decreased red blood cells - unusually weak or tired, fainting spells,  lightheadedness -breathing problems -confused, not responsive -chest pain -fast, irregular heartbeat -feeling faint or lightheaded, falls -mouth sores -redness, blistering, peeling or loosening of the skin, including inside the mouth -stomach pain -swelling of the ankles, feet, or hands -trouble passing urine or change in the amount of urine Side effects that usually do not require medical attention (report to your doctor or other health care professional if they continue or are bothersome): -anxiety -headache -loss of appetite -muscle aches -nausea -night sweats This list may not describe all possible side effects. Call your doctor for medical advice about side effects. You may report side effects to FDA at 1-800-FDA-1088. Where should I keep my medicine? This drug is given in a hospital or clinic and will not be stored at home. NOTE: This sheet is a summary. It may not cover all possible information. If you have questions about this medicine, talk to your doctor, pharmacist, or health care provider.    2016, Elsevier/Gold Standard. (2014-10-20 22:30:56)  Pegfilgrastim injection What is this medicine? PEGFILGRASTIM (PEG fil gra stim) is a long-acting granulocyte colony-stimulating factor that stimulates the growth of neutrophils, a type of white blood cell important in the  body's fight against infection. It is used to reduce the incidence of fever and infection in patients with certain types of cancer who are receiving chemotherapy that affects the bone marrow, and to increase survival after being exposed to high doses of radiation. This medicine may be used for other purposes; ask your health care provider or pharmacist if you have questions. What should I tell my health care provider before I take this medicine? They need to know if you have any of these conditions: -kidney disease -latex allergy -ongoing radiation therapy -sickle cell disease -skin reactions to acrylic  adhesives (On-Body Injector only) -an unusual or allergic reaction to pegfilgrastim, filgrastim, other medicines, foods, dyes, or preservatives -pregnant or trying to get pregnant -breast-feeding How should I use this medicine? This medicine is for injection under the skin. If you get this medicine at home, you will be taught how to prepare and give the pre-filled syringe or how to use the On-body Injector. Refer to the patient Instructions for Use for detailed instructions. Use exactly as directed. Take your medicine at regular intervals. Do not take your medicine more often than directed. It is important that you put your used needles and syringes in a special sharps container. Do not put them in a trash can. If you do not have a sharps container, call your pharmacist or healthcare provider to get one. Talk to your pediatrician regarding the use of this medicine in children. While this drug may be prescribed for selected conditions, precautions do apply. Overdosage: If you think you have taken too much of this medicine contact a poison control center or emergency room at once. NOTE: This medicine is only for you. Do not share this medicine with others. What if I miss a dose? It is important not to miss your dose. Call your doctor or health care professional if you miss your dose. If you miss a dose due to an On-body Injector failure or leakage, a new dose should be administered as soon as possible using a single prefilled syringe for manual use. What may interact with this medicine? Interactions have not been studied. Give your health care provider a list of all the medicines, herbs, non-prescription drugs, or dietary supplements you use. Also tell them if you smoke, drink alcohol, or use illegal drugs. Some items may interact with your medicine. This list may not describe all possible interactions. Give your health care provider a list of all the medicines, herbs, non-prescription drugs, or dietary  supplements you use. Also tell them if you smoke, drink alcohol, or use illegal drugs. Some items may interact with your medicine. What should I watch for while using this medicine? You may need blood work done while you are taking this medicine. If you are going to need a MRI, CT scan, or other procedure, tell your doctor that you are using this medicine (On-Body Injector only). What side effects may I notice from receiving this medicine? Side effects that you should report to your doctor or health care professional as soon as possible: -allergic reactions like skin rash, itching or hives, swelling of the face, lips, or tongue -dizziness -fever -pain, redness, or irritation at site where injected -pinpoint red spots on the skin -red or dark-brown urine -shortness of breath or breathing problems -stomach or side pain, or pain at the shoulder -swelling -tiredness -trouble passing urine or change in the amount of urine Side effects that usually do not require medical attention (report to your doctor or health care  professional if they continue or are bothersome): -bone pain -muscle pain This list may not describe all possible side effects. Call your doctor for medical advice about side effects. You may report side effects to FDA at 1-800-FDA-1088. Where should I keep my medicine? Keep out of the reach of children. Store pre-filled syringes in a refrigerator between 2 and 8 degrees C (36 and 46 degrees F). Do not freeze. Keep in carton to protect from light. Throw away this medicine if it is left out of the refrigerator for more than 48 hours. Throw away any unused medicine after the expiration date. NOTE: This sheet is a summary. It may not cover all possible information. If you have questions about this medicine, talk to your doctor, pharmacist, or health care provider.    2016, Elsevier/Gold Standard. (2014-09-02 14:30:14)

## 2016-04-25 NOTE — Progress Notes (Signed)
Pt receiving first time rchop and neulasta. Reinforced education about potential side effects of all chemotherapy and neulasta. Pt verbalized understanding and asked appropriate questions. During rituxan titration BP trended down. No BP medications taken at home. Pt did not complain of any changes. In-basket placed for f/u phone call tomorrow post first-time chemotherapy. VSS. AVS printed. Pt asymptomatic. Pt educated about appropriate times to call symptom management or come to the ED if certain symptoms occur. Pt verbalized understanding.

## 2016-04-26 ENCOUNTER — Telehealth: Payer: Self-pay | Admitting: Medical Oncology

## 2016-04-26 NOTE — Telephone Encounter (Signed)
Pt stated he is doing well today , slept well and is eating and drinking . He said his onpro is scheduled to inject soon and he is aware of potential side effects.

## 2016-05-01 ENCOUNTER — Other Ambulatory Visit (HOSPITAL_BASED_OUTPATIENT_CLINIC_OR_DEPARTMENT_OTHER): Payer: Medicare Other

## 2016-05-01 ENCOUNTER — Ambulatory Visit (HOSPITAL_BASED_OUTPATIENT_CLINIC_OR_DEPARTMENT_OTHER): Payer: Medicare Other | Admitting: Nurse Practitioner

## 2016-05-01 VITALS — BP 120/63 | HR 87 | Temp 98.3°F | Resp 17 | Ht 68.0 in | Wt 203.9 lb

## 2016-05-01 DIAGNOSIS — R110 Nausea: Secondary | ICD-10-CM

## 2016-05-01 DIAGNOSIS — C8338 Diffuse large B-cell lymphoma, lymph nodes of multiple sites: Secondary | ICD-10-CM

## 2016-05-01 DIAGNOSIS — D6959 Other secondary thrombocytopenia: Secondary | ICD-10-CM

## 2016-05-01 LAB — COMPREHENSIVE METABOLIC PANEL
ALT: 38 U/L (ref 0–55)
AST: 16 U/L (ref 5–34)
Albumin: 3.2 g/dL — ABNORMAL LOW (ref 3.5–5.0)
Alkaline Phosphatase: 79 U/L (ref 40–150)
Anion Gap: 9 mEq/L (ref 3–11)
BUN: 28.3 mg/dL — AB (ref 7.0–26.0)
CHLORIDE: 106 meq/L (ref 98–109)
CO2: 25 mEq/L (ref 22–29)
Calcium: 8.6 mg/dL (ref 8.4–10.4)
Creatinine: 0.9 mg/dL (ref 0.7–1.3)
EGFR: 79 mL/min/{1.73_m2} — ABNORMAL LOW (ref >90)
GLUCOSE: 95 mg/dL (ref 70–140)
POTASSIUM: 4.1 meq/L (ref 3.5–5.1)
SODIUM: 141 meq/L (ref 136–145)
Total Bilirubin: 0.96 mg/dL (ref 0.20–1.20)
Total Protein: 5.9 g/dL — ABNORMAL LOW (ref 6.4–8.3)

## 2016-05-01 LAB — LACTATE DEHYDROGENASE: LDH: 147 U/L (ref 125–245)

## 2016-05-01 LAB — CBC WITH DIFFERENTIAL/PLATELET
BASO%: 0.5 % (ref 0.0–2.0)
Basophils Absolute: 0 10*3/uL (ref 0.0–0.1)
EOS%: 3.1 % (ref 0.0–7.0)
Eosinophils Absolute: 0.1 10*3/uL (ref 0.0–0.5)
HCT: 39.5 % (ref 38.4–49.9)
HGB: 13.7 g/dL (ref 13.0–17.1)
LYMPH#: 1.3 10*3/uL (ref 0.9–3.3)
LYMPH%: 34.1 % (ref 14.0–49.0)
MCH: 32.5 pg (ref 27.2–33.4)
MCHC: 34.7 g/dL (ref 32.0–36.0)
MCV: 93.8 fL (ref 79.3–98.0)
MONO#: 0.2 10*3/uL (ref 0.1–0.9)
MONO%: 3.9 % (ref 0.0–14.0)
NEUT#: 2.2 10*3/uL (ref 1.5–6.5)
NEUT%: 58.4 % (ref 39.0–75.0)
Platelets: 47 10*3/uL — ABNORMAL LOW (ref 140–400)
RBC: 4.21 10*6/uL (ref 4.20–5.82)
RDW: 13.5 % (ref 11.0–14.6)
WBC: 3.8 10*3/uL — ABNORMAL LOW (ref 4.0–10.3)
nRBC: 0 % (ref 0–0)

## 2016-05-01 NOTE — Progress Notes (Signed)
  [facility] OFFICE PROGRESS NOTE   DIAGNOSIS: Stage II large B-cell non-Hodgkin lymphoma diagnosed in August 2017  PRIOR THERAPY: None.  CURRENT THERAPY: Systemic chemotherapy with CHOP/Rituxan every 3 weeks with Neulasta support. First dose 04/25/2016.   INTERVAL HISTORY:   Mr. Sega returns as scheduled. He completed cycle 1 CHOP/Rituxan 04/25/2016. He had a few episodes of very mild nausea. No mouth sores. He developed constipation around day 4 or 5. He began a stool softener and prunes. The constipation has resolved. No numbness or tingling in his hands or feet. No fevers or sweats. He has a good appetite. He is gaining weight. He denies any bleeding. No pain following Neulasta.  Objective:  Vital signs in last 24 hours:  Blood pressure 120/63, pulse 87, temperature 98.3 F (36.8 C), temperature source Oral, resp. rate 17, height 5\' 8"  (1.727 m), weight 203 lb 14.4 oz (92.5 kg), SpO2 99 %.    HEENT: No thrush or ulcers. Resp: Lungs clear bilaterally. Cardio: Regular rate and rhythm. GI: Abdomen soft and nontender. No hepatomegaly. Vascular: No leg edema. Skin: No rash. Port-A-Cath without erythema.    Lab Results:  Lab Results  Component Value Date   WBC 3.8 (L) 05/01/2016   HGB 13.7 05/01/2016   HCT 39.5 05/01/2016   MCV 93.8 05/01/2016   PLT 47 (L) 05/01/2016   NEUTROABS 2.2 05/01/2016    Imaging:  No results found.  Medications: I have reviewed the patient's current medications.  Assessment/Plan: 1. Stage II large B-cell non-Hodgkin lymphoma diagnosed in August 2017  Bone marrow 04/12/2016 negative for involvement with lymphoma  Status post cycle 1 CHOP/Rituxan 04/25/2016 with Neulasta support 2. Thrombocytopenia secondary to chemotherapy 05/01/2016   Disposition: Mr. Iwen appears stable. He has completed 1 cycle of CHOP/Rituxan. He is thrombocytopenic on labs today. We reviewed thrombocytpenia precautions. He understands to  contact the office with any bleeding. Dr. Julien Nordmann like for him to return in one week for a follow-up CBC. He will return for a follow-up visit and cycle 2 CHOP/Rituxan in 2 weeks. He will contact the office in the interim as outlined above or with any other problems.  Labs/plan reviewed with Dr. Julien Nordmann.    Ned Card ANP/GNP-BC   05/01/2016  3:09 PM

## 2016-05-07 ENCOUNTER — Telehealth: Payer: Self-pay | Admitting: *Deleted

## 2016-05-07 NOTE — Telephone Encounter (Signed)
Reviewed with MD, who advised PT does not need to be seen by provider 9/12. Drop in counts due to chemotherapy, this is to be expected. Pt to f/u with MD on scheduled visit 9/19. Notified pt. No further concerns.

## 2016-05-07 NOTE — Telephone Encounter (Signed)
"  I do not have an appointment to see Dr. Julien Nordmann tomorrow.  Saw Ned Card 05-01-2016, were told we'd f/u with Dr. Julien Nordmann in a week due to platelet count dropping from 201 to 47.  Need to know what time before tomorrow.  Return number is [phone]."

## 2016-05-08 ENCOUNTER — Other Ambulatory Visit (HOSPITAL_BASED_OUTPATIENT_CLINIC_OR_DEPARTMENT_OTHER): Payer: Medicare Other

## 2016-05-08 DIAGNOSIS — C8338 Diffuse large B-cell lymphoma, lymph nodes of multiple sites: Secondary | ICD-10-CM | POA: Diagnosis not present

## 2016-05-08 LAB — COMPREHENSIVE METABOLIC PANEL
ALBUMIN: 3.1 g/dL — AB (ref 3.5–5.0)
ALK PHOS: 83 U/L (ref 40–150)
ALT: 20 U/L (ref 0–55)
ANION GAP: 7 meq/L (ref 3–11)
AST: 16 U/L (ref 5–34)
BUN: 12.8 mg/dL (ref 7.0–26.0)
CALCIUM: 9 mg/dL (ref 8.4–10.4)
CO2: 27 mEq/L (ref 22–29)
Chloride: 106 mEq/L (ref 98–109)
Creatinine: 1.1 mg/dL (ref 0.7–1.3)
EGFR: 63 mL/min/{1.73_m2} — AB (ref >90)
GLUCOSE: 136 mg/dL (ref 70–140)
POTASSIUM: 4.3 meq/L (ref 3.5–5.1)
SODIUM: 140 meq/L (ref 136–145)
Total Bilirubin: 0.39 mg/dL (ref 0.20–1.20)
Total Protein: 6.1 g/dL — ABNORMAL LOW (ref 6.4–8.3)

## 2016-05-08 LAB — CBC WITH DIFFERENTIAL/PLATELET
BASO%: 0.3 % (ref 0.0–2.0)
BASOS ABS: 0 10*3/uL (ref 0.0–0.1)
EOS ABS: 0.1 10*3/uL (ref 0.0–0.5)
EOS%: 1 % (ref 0.0–7.0)
HEMATOCRIT: 36.2 % — AB (ref 38.4–49.9)
HEMOGLOBIN: 12.4 g/dL — AB (ref 13.0–17.1)
LYMPH#: 1.1 10*3/uL (ref 0.9–3.3)
LYMPH%: 18.9 % (ref 14.0–49.0)
MCH: 32.5 pg (ref 27.2–33.4)
MCHC: 34.3 g/dL (ref 32.0–36.0)
MCV: 94.8 fL (ref 79.3–98.0)
MONO#: 0.6 10*3/uL (ref 0.1–0.9)
MONO%: 10.4 % (ref 0.0–14.0)
NEUT%: 69.4 % (ref 39.0–75.0)
NEUTROS ABS: 4.1 10*3/uL (ref 1.5–6.5)
Platelets: 144 10*3/uL (ref 140–400)
RBC: 3.82 10*6/uL — ABNORMAL LOW (ref 4.20–5.82)
RDW: 13.6 % (ref 11.0–14.6)
WBC: 5.9 10*3/uL (ref 4.0–10.3)

## 2016-05-08 LAB — LACTATE DEHYDROGENASE: LDH: 153 U/L (ref 125–245)

## 2016-05-08 LAB — URIC ACID: URIC ACID, SERUM: 4.4 mg/dL (ref 2.6–7.4)

## 2016-05-15 ENCOUNTER — Ambulatory Visit: Payer: Medicare Other

## 2016-05-15 ENCOUNTER — Other Ambulatory Visit (HOSPITAL_BASED_OUTPATIENT_CLINIC_OR_DEPARTMENT_OTHER): Payer: Medicare Other

## 2016-05-15 ENCOUNTER — Ambulatory Visit (HOSPITAL_BASED_OUTPATIENT_CLINIC_OR_DEPARTMENT_OTHER): Payer: Medicare Other

## 2016-05-15 ENCOUNTER — Ambulatory Visit (HOSPITAL_BASED_OUTPATIENT_CLINIC_OR_DEPARTMENT_OTHER): Payer: Medicare Other | Admitting: Internal Medicine

## 2016-05-15 ENCOUNTER — Encounter: Payer: Self-pay | Admitting: Internal Medicine

## 2016-05-15 VITALS — BP 111/62 | HR 76 | Temp 98.0°F | Resp 18 | Ht 68.0 in | Wt 192.5 lb

## 2016-05-15 VITALS — BP 102/55 | HR 75 | Temp 98.2°F | Resp 16

## 2016-05-15 DIAGNOSIS — Z5189 Encounter for other specified aftercare: Secondary | ICD-10-CM | POA: Diagnosis not present

## 2016-05-15 DIAGNOSIS — C8338 Diffuse large B-cell lymphoma, lymph nodes of multiple sites: Secondary | ICD-10-CM

## 2016-05-15 DIAGNOSIS — Z5111 Encounter for antineoplastic chemotherapy: Secondary | ICD-10-CM | POA: Diagnosis not present

## 2016-05-15 LAB — CBC WITH DIFFERENTIAL/PLATELET
BASO%: 0.8 % (ref 0.0–2.0)
Basophils Absolute: 0.1 10*3/uL (ref 0.0–0.1)
EOS%: 1.2 % (ref 0.0–7.0)
Eosinophils Absolute: 0.1 10*3/uL (ref 0.0–0.5)
HEMATOCRIT: 40.2 % (ref 38.4–49.9)
HEMOGLOBIN: 13.5 g/dL (ref 13.0–17.1)
LYMPH#: 1 10*3/uL (ref 0.9–3.3)
LYMPH%: 16.3 % (ref 14.0–49.0)
MCH: 32.5 pg (ref 27.2–33.4)
MCHC: 33.7 g/dL (ref 32.0–36.0)
MCV: 96.7 fL (ref 79.3–98.0)
MONO#: 0.8 10*3/uL (ref 0.1–0.9)
MONO%: 12.3 % (ref 0.0–14.0)
NEUT#: 4.2 10*3/uL (ref 1.5–6.5)
NEUT%: 69.4 % (ref 39.0–75.0)
Platelets: 332 10*3/uL (ref 140–400)
RBC: 4.16 10*6/uL — AB (ref 4.20–5.82)
RDW: 14 % (ref 11.0–14.6)
WBC: 6.1 10*3/uL (ref 4.0–10.3)

## 2016-05-15 LAB — COMPREHENSIVE METABOLIC PANEL
ALT: 17 U/L (ref 0–55)
AST: 19 U/L (ref 5–34)
Albumin: 3.3 g/dL — ABNORMAL LOW (ref 3.5–5.0)
Alkaline Phosphatase: 88 U/L (ref 40–150)
Anion Gap: 11 mEq/L (ref 3–11)
BUN: 16.8 mg/dL (ref 7.0–26.0)
CHLORIDE: 109 meq/L (ref 98–109)
CO2: 23 meq/L (ref 22–29)
Calcium: 9.5 mg/dL (ref 8.4–10.4)
Creatinine: 1.1 mg/dL (ref 0.7–1.3)
EGFR: 63 mL/min/{1.73_m2} — ABNORMAL LOW (ref >90)
Glucose: 117 mg/dl (ref 70–140)
Potassium: 3.8 mEq/L (ref 3.5–5.1)
Sodium: 142 mEq/L (ref 136–145)
TOTAL PROTEIN: 6.6 g/dL (ref 6.4–8.3)
Total Bilirubin: 0.47 mg/dL (ref 0.20–1.20)

## 2016-05-15 LAB — LACTATE DEHYDROGENASE: LDH: 170 U/L (ref 125–245)

## 2016-05-15 MED ORDER — ACETAMINOPHEN 325 MG PO TABS
ORAL_TABLET | ORAL | Status: AC
  Filled 2016-05-15: qty 2

## 2016-05-15 MED ORDER — SODIUM CHLORIDE 0.9% FLUSH
10.0000 mL | INTRAVENOUS | Status: DC | PRN
  Administered 2016-05-15: 10 mL
  Filled 2016-05-15: qty 10

## 2016-05-15 MED ORDER — HEPARIN SOD (PORK) LOCK FLUSH 100 UNIT/ML IV SOLN
500.0000 [IU] | Freq: Once | INTRAVENOUS | Status: AC | PRN
  Administered 2016-05-15: 500 [IU]
  Filled 2016-05-15: qty 5

## 2016-05-15 MED ORDER — DOXORUBICIN HCL CHEMO IV INJECTION 2 MG/ML
50.0000 mg/m2 | Freq: Once | INTRAVENOUS | Status: AC
  Administered 2016-05-15: 104 mg via INTRAVENOUS
  Filled 2016-05-15: qty 52

## 2016-05-15 MED ORDER — PALONOSETRON HCL INJECTION 0.25 MG/5ML
INTRAVENOUS | Status: AC
Start: 2016-05-15 — End: 2016-05-15
  Filled 2016-05-15: qty 5

## 2016-05-15 MED ORDER — DIPHENHYDRAMINE HCL 25 MG PO CAPS
ORAL_CAPSULE | ORAL | Status: AC
  Filled 2016-05-15: qty 2

## 2016-05-15 MED ORDER — PEGFILGRASTIM 6 MG/0.6ML ~~LOC~~ PSKT
6.0000 mg | PREFILLED_SYRINGE | Freq: Once | SUBCUTANEOUS | Status: AC
  Administered 2016-05-15: 6 mg via SUBCUTANEOUS
  Filled 2016-05-15: qty 0.6

## 2016-05-15 MED ORDER — SODIUM CHLORIDE 0.9 % IV SOLN
10.0000 mg | Freq: Once | INTRAVENOUS | Status: AC
  Administered 2016-05-15: 10 mg via INTRAVENOUS
  Filled 2016-05-15: qty 1

## 2016-05-15 MED ORDER — SODIUM CHLORIDE 0.9 % IV SOLN
Freq: Once | INTRAVENOUS | Status: AC
  Administered 2016-05-15: 10:00:00 via INTRAVENOUS

## 2016-05-15 MED ORDER — SODIUM CHLORIDE 0.9 % IV SOLN
2.0000 mg | Freq: Once | INTRAVENOUS | Status: AC
  Administered 2016-05-15: 2 mg via INTRAVENOUS
  Filled 2016-05-15: qty 2

## 2016-05-15 MED ORDER — SODIUM CHLORIDE 0.9 % IV SOLN
750.0000 mg/m2 | Freq: Once | INTRAVENOUS | Status: AC
  Administered 2016-05-15: 1560 mg via INTRAVENOUS
  Filled 2016-05-15: qty 78

## 2016-05-15 MED ORDER — SODIUM CHLORIDE 0.9 % IV SOLN
375.0000 mg/m2 | Freq: Once | INTRAVENOUS | Status: AC
  Administered 2016-05-15: 800 mg via INTRAVENOUS
  Filled 2016-05-15: qty 50

## 2016-05-15 MED ORDER — DIPHENHYDRAMINE HCL 25 MG PO CAPS
50.0000 mg | ORAL_CAPSULE | Freq: Once | ORAL | Status: AC
  Administered 2016-05-15: 50 mg via ORAL

## 2016-05-15 MED ORDER — ACETAMINOPHEN 325 MG PO TABS
650.0000 mg | ORAL_TABLET | Freq: Once | ORAL | Status: AC
  Administered 2016-05-15: 650 mg via ORAL

## 2016-05-15 MED ORDER — PALONOSETRON HCL INJECTION 0.25 MG/5ML
0.2500 mg | Freq: Once | INTRAVENOUS | Status: AC
  Administered 2016-05-15: 0.25 mg via INTRAVENOUS

## 2016-05-15 NOTE — Progress Notes (Signed)
[facility] [phone]   [phone]  OFFICE PROGRESS NOTE   [name], MD [place] [address]  DIAGNOSIS: Stage II large B-cell non-Hodgkin lymphoma diagnosed in August 2017, pending further staging workup.  PRIOR THERAPY: None.  CURRENT THERAPY: Systemic chemotherapy with CHOP/Rituxan every 3 weeks with Neulasta support. First dose 04/24/2016. Status post one cycle  INTERVAL HISTORY: Seth Gilbert 80 y.o. male returns to the clinic today for follow-up visit accompanied by his wife. The patient is feeling fine today with no specific complaints. He tolerated the first cycle of systemic chemotherapy with CHOP/Rituxan fairly well with no significant adverse effect except for arthralgia after the Neulasta injection. He lost around 4 pounds since his last visit. He denied having any significant chest pain, shortness of breath, cough or hemoptysis. He denied having any nausea, vomiting, diarrhea or constipation. He has no fever or chills. He is here today to start cycle #2.  MEDICAL HISTORY: Past Medical History:  Diagnosis Date  . Diverticulitis   . GERD (gastroesophageal reflux disease)    Heartburn at times  . Heart murmur    "comes and goes" onset 4th grade - last time anyone heard it was 01/2014  . History of hiatal hernia   . History of kidney stones   . HOH (hard of hearing)   . PONV (postoperative nausea and vomiting)   . Rosacea   . Wears glasses     ALLERGIES:  is allergic to no known allergies.  MEDICATIONS:  Current Outpatient Prescriptions  Medication Sig Dispense Refill  . allopurinol (ZYLOPRIM) 100 MG tablet Take 1 tablet (100 mg total) by mouth 2 (two) times daily. 60 tablet 3  . ampicillin (PRINCIPEN) 500 MG capsule Take 500 mg by mouth daily.     . famotidine (PEPCID) 40 MG tablet Take 40 mg by mouth daily as needed for heartburn.     . Glucosamine-Chondroit-Vit C-Mn (GLUCOSAMINE 1500 COMPLEX) CAPS  Take 3 capsules by mouth every morning.     Marland Kitchen HYDROcodone-acetaminophen (NORCO/VICODIN) 5-325 MG tablet Take 1 tablet by mouth every 6 (six) hours as needed for moderate pain. (Patient not taking: Reported on 04/17/2016) 15 tablet 0  . lidocaine-prilocaine (EMLA) cream Apply 1 application topically as needed. 30 g 0  . metroNIDAZOLE (METROGEL) 0.75 % gel Apply 1 application topically daily.     . Multiple Vitamins-Minerals (CENTRUM SILVER PO) Take 1 tablet by mouth daily.     . predniSONE (DELTASONE) 50 MG tablet 2 tablets by mouth daily for 5 days every chemotherapy cycle starting with the first day of the chemotherapy. (Patient not taking: Reported on 05/01/2016) 60 tablet 0  . prochlorperazine (COMPAZINE) 10 MG tablet Take 1 tablet (10 mg total) by mouth every 6 (six) hours as needed for nausea or vomiting. 30 tablet 0  . senna (SENOKOT) 8.6 MG tablet Take 1 tablet by mouth daily as needed for constipation.    . vitamin E 400 UNIT capsule Take 400 Units by mouth daily.     No current facility-administered medications for this visit.     SURGICAL HISTORY:  Past Surgical History:  Procedure Laterality Date  . APPENDECTOMY    . COLONOSCOPY    . HEMORROIDECTOMY     inflammed anal gland removed  . INGUINAL HERNIA REPAIR Left 02/11/2014   Procedure: LEFT INGUINAL HERNIA REPAIR WITH MESH ;  Surgeon: Joyice Faster. Cornett, MD;  Location: [facility];  Service:  General;  Laterality: Left;  . INSERTION OF MESH N/A 02/11/2014   Procedure: INSERTION OF MESH;  Surgeon: Joyice Faster. Cornett, MD;  Location: [facility];  Service: General;  Laterality: N/A;  . PORTACATH PLACEMENT Left 04/10/2016   Procedure: INSERTION PORT-A-CATH into LEFT Subclavian Vein with guidance from ultrasound and flouroscopy;  Surgeon: Grace Isaac, MD;  Location: [facility];  Service: Thoracic;  Laterality: Left;  . TONSILLECTOMY    . VIDEO BRONCHOSCOPY WITH ENDOBRONCHIAL ULTRASOUND N/A 03/30/2016   Procedure:  VIDEO BRONCHOSCOPY WITH ENDOBRONCHIAL ULTRASOUND,WITH TRANSBRONCHIAL BIOPSY;  Surgeon: Grace Isaac, MD;  Location: [facility] OR;  Service: Thoracic;  Laterality: N/A;    REVIEW OF SYSTEMS:  A comprehensive review of systems was negative.   PHYSICAL EXAMINATION: General appearance: alert, cooperative and no distress Head: Normocephalic, without obvious abnormality, atraumatic Neck: no adenopathy, no JVD, supple, symmetrical, trachea midline and thyroid not enlarged, symmetric, no tenderness/mass/nodules Lymph nodes: Cervical, supraclavicular, and axillary nodes normal. Resp: clear to auscultation bilaterally Back: symmetric, no curvature. ROM normal. No CVA tenderness. Cardio: regular rate and rhythm, S1, S2 normal, no murmur, click, rub or gallop GI: soft, non-tender; bowel sounds normal; no masses,  no organomegaly Extremities: extremities normal, atraumatic, no cyanosis or edema Neurologic: Alert and oriented X 3, normal strength and tone. Normal symmetric reflexes. Normal coordination and gait  ECOG PERFORMANCE STATUS: 0 - Asymptomatic  Blood pressure 111/62, pulse 76, temperature 98 F (36.7 C), temperature source Oral, resp. rate 18, height 5\' 8"  (1.727 m), weight 192 lb 8 oz (87.3 kg), SpO2 97 %.  LABORATORY DATA: Lab Results  Component Value Date   WBC 6.1 05/15/2016   HGB 13.5 05/15/2016   HCT 40.2 05/15/2016   MCV 96.7 05/15/2016   PLT 332 05/15/2016      Chemistry      Component Value Date/Time   NA 140 05/08/2016 1145   K 4.3 05/08/2016 1145   CL 107 03/30/2016 0856   CO2 27 05/08/2016 1145   BUN 12.8 05/08/2016 1145   CREATININE 1.1 05/08/2016 1145      Component Value Date/Time   CALCIUM 9.0 05/08/2016 1145   ALKPHOS 83 05/08/2016 1145   AST 16 05/08/2016 1145   ALT 20 05/08/2016 1145   BILITOT 0.39 05/08/2016 1145       RADIOGRAPHIC STUDIES: No results found.  ASSESSMENT AND PLAN: This is a very pleasant 80 years old white male with recently  diagnosed at least a stage II large B-cell non-Hodgkin lymphoma involving the lymph nodes in the chest and abdomen. There is no involvement of the bone marrow or the liver or spleen diagnosed in August 2017. The patient is currently undergoing systemic chemotherapy with CHOP/Rituxan is status post 1 cycle. He tolerated the first cycle of his treatment well. I recommended for the patient to proceed with cycle #2 today as scheduled. He will come back for follow-up visit in 3 weeks for evaluation before starting cycle #3. There is advised to call immediately if he has any concerning symptoms in the interval. The patient voices understanding of current disease status and treatment options and is in agreement with the current care plan.  All questions were answered. The patient knows to call the clinic with any problems, questions or concerns. We can certainly see the patient much sooner if necessary.  Disclaimer: This note was dictated with voice recognition software. Similar sounding words can inadvertently be transcribed and may not be corrected upon review.

## 2016-05-15 NOTE — Addendum Note (Signed)
Addended by: Margaret Pyle on: 05/15/2016 04:30 PM   Modules accepted: Orders

## 2016-05-22 ENCOUNTER — Other Ambulatory Visit (HOSPITAL_BASED_OUTPATIENT_CLINIC_OR_DEPARTMENT_OTHER): Payer: Medicare Other

## 2016-05-22 ENCOUNTER — Telehealth: Payer: Self-pay | Admitting: *Deleted

## 2016-05-22 DIAGNOSIS — C8338 Diffuse large B-cell lymphoma, lymph nodes of multiple sites: Secondary | ICD-10-CM

## 2016-05-22 LAB — CBC WITH DIFFERENTIAL/PLATELET
BASO%: 2.4 % — ABNORMAL HIGH (ref 0.0–2.0)
BASOS ABS: 0 10*3/uL (ref 0.0–0.1)
EOS ABS: 0.1 10*3/uL (ref 0.0–0.5)
EOS%: 7.1 % — ABNORMAL HIGH (ref 0.0–7.0)
HEMATOCRIT: 35.1 % — AB (ref 38.4–49.9)
HGB: 12.1 g/dL — ABNORMAL LOW (ref 13.0–17.1)
LYMPH%: 45.9 % (ref 14.0–49.0)
MCH: 32.2 pg (ref 27.2–33.4)
MCHC: 34.5 g/dL (ref 32.0–36.0)
MCV: 93.4 fL (ref 79.3–98.0)
MONO#: 0.2 10*3/uL (ref 0.1–0.9)
MONO%: 22.4 % — AB (ref 0.0–14.0)
NEUT%: 22.2 % — AB (ref 39.0–75.0)
NEUTROS ABS: 0.2 10*3/uL — AB (ref 1.5–6.5)
PLATELETS: 47 10*3/uL — AB (ref 140–400)
RBC: 3.76 10*6/uL — ABNORMAL LOW (ref 4.20–5.82)
RDW: 14 % (ref 11.0–14.6)
WBC: 0.9 10*3/uL — CL (ref 4.0–10.3)
lymph#: 0.4 10*3/uL — ABNORMAL LOW (ref 0.9–3.3)
nRBC: 0 % (ref 0–0)

## 2016-05-22 LAB — COMPREHENSIVE METABOLIC PANEL
ALT: 18 U/L (ref 0–55)
ANION GAP: 9 meq/L (ref 3–11)
AST: 12 U/L (ref 5–34)
Albumin: 2.8 g/dL — ABNORMAL LOW (ref 3.5–5.0)
Alkaline Phosphatase: 77 U/L (ref 40–150)
BUN: 24.5 mg/dL (ref 7.0–26.0)
CALCIUM: 8.6 mg/dL (ref 8.4–10.4)
CHLORIDE: 107 meq/L (ref 98–109)
CO2: 23 mEq/L (ref 22–29)
CREATININE: 1 mg/dL (ref 0.7–1.3)
EGFR: 73 mL/min/{1.73_m2} — AB (ref >90)
Glucose: 140 mg/dl (ref 70–140)
Potassium: 4 mEq/L (ref 3.5–5.1)
Sodium: 139 mEq/L (ref 136–145)
Total Bilirubin: 1.03 mg/dL (ref 0.20–1.20)
Total Protein: 5.2 g/dL — ABNORMAL LOW (ref 6.4–8.3)

## 2016-05-22 LAB — LACTATE DEHYDROGENASE: LDH: 116 U/L — AB (ref 125–245)

## 2016-05-22 NOTE — Telephone Encounter (Signed)
Per MD,notified pt to take neutropenic precautions, encouraged meals cooked at home, no buffet,  good hand washing, temp 100.5 or greater to go to ED for evaluation.

## 2016-05-29 ENCOUNTER — Other Ambulatory Visit (HOSPITAL_BASED_OUTPATIENT_CLINIC_OR_DEPARTMENT_OTHER): Payer: Medicare Other

## 2016-05-29 ENCOUNTER — Telehealth: Payer: Self-pay | Admitting: Medical Oncology

## 2016-05-29 DIAGNOSIS — C8338 Diffuse large B-cell lymphoma, lymph nodes of multiple sites: Secondary | ICD-10-CM | POA: Diagnosis not present

## 2016-05-29 LAB — CBC WITH DIFFERENTIAL/PLATELET
BASO%: 0.6 % (ref 0.0–2.0)
Basophils Absolute: 0 10*3/uL (ref 0.0–0.1)
EOS%: 2 % (ref 0.0–7.0)
Eosinophils Absolute: 0.2 10*3/uL (ref 0.0–0.5)
HCT: 36.7 % — ABNORMAL LOW (ref 38.4–49.9)
HEMOGLOBIN: 12.4 g/dL — AB (ref 13.0–17.1)
LYMPH%: 5.7 % — AB (ref 14.0–49.0)
MCH: 32.5 pg (ref 27.2–33.4)
MCHC: 33.7 g/dL (ref 32.0–36.0)
MCV: 96.6 fL (ref 79.3–98.0)
MONO#: 0.6 10*3/uL (ref 0.1–0.9)
MONO%: 7.6 % (ref 0.0–14.0)
NEUT%: 84.1 % — ABNORMAL HIGH (ref 39.0–75.0)
NEUTROS ABS: 6.6 10*3/uL — AB (ref 1.5–6.5)
Platelets: 186 10*3/uL (ref 140–400)
RBC: 3.8 10*6/uL — AB (ref 4.20–5.82)
RDW: 14.9 % — AB (ref 11.0–14.6)
WBC: 7.8 10*3/uL (ref 4.0–10.3)
lymph#: 0.4 10*3/uL — ABNORMAL LOW (ref 0.9–3.3)

## 2016-05-29 LAB — COMPREHENSIVE METABOLIC PANEL
ALBUMIN: 3 g/dL — AB (ref 3.5–5.0)
ALK PHOS: 92 U/L (ref 40–150)
ALT: 18 U/L (ref 0–55)
AST: 18 U/L (ref 5–34)
Anion Gap: 8 mEq/L (ref 3–11)
BILIRUBIN TOTAL: 0.61 mg/dL (ref 0.20–1.20)
BUN: 15.7 mg/dL (ref 7.0–26.0)
CO2: 25 meq/L (ref 22–29)
Calcium: 9.3 mg/dL (ref 8.4–10.4)
Chloride: 105 mEq/L (ref 98–109)
Creatinine: 1.1 mg/dL (ref 0.7–1.3)
EGFR: 64 mL/min/{1.73_m2} — AB (ref >90)
GLUCOSE: 126 mg/dL (ref 70–140)
POTASSIUM: 4.1 meq/L (ref 3.5–5.1)
SODIUM: 139 meq/L (ref 136–145)
TOTAL PROTEIN: 6.2 g/dL — AB (ref 6.4–8.3)

## 2016-05-29 LAB — URIC ACID: Uric Acid, Serum: 4.7 mg/dl (ref 2.6–7.4)

## 2016-05-29 LAB — LACTATE DEHYDROGENASE: LDH: 187 U/L (ref 125–245)

## 2016-05-29 NOTE — Telephone Encounter (Signed)
Returned call - reviewed labs and pt asking if he can get flu vaccine tomorrow at his providers office. . I told him he can take flu vaccine tomorrow

## 2016-05-30 DIAGNOSIS — Z23 Encounter for immunization: Secondary | ICD-10-CM | POA: Diagnosis not present

## 2016-06-05 ENCOUNTER — Encounter: Payer: Self-pay | Admitting: Internal Medicine

## 2016-06-05 ENCOUNTER — Other Ambulatory Visit (HOSPITAL_BASED_OUTPATIENT_CLINIC_OR_DEPARTMENT_OTHER): Payer: Medicare Other

## 2016-06-05 ENCOUNTER — Telehealth: Payer: Self-pay | Admitting: Internal Medicine

## 2016-06-05 ENCOUNTER — Ambulatory Visit: Payer: Medicare Other

## 2016-06-05 ENCOUNTER — Ambulatory Visit (HOSPITAL_BASED_OUTPATIENT_CLINIC_OR_DEPARTMENT_OTHER): Payer: Medicare Other | Admitting: Internal Medicine

## 2016-06-05 ENCOUNTER — Ambulatory Visit (HOSPITAL_BASED_OUTPATIENT_CLINIC_OR_DEPARTMENT_OTHER): Payer: Medicare Other

## 2016-06-05 VITALS — BP 119/58 | HR 108 | Temp 98.6°F | Resp 17 | Ht 68.0 in | Wt 191.4 lb

## 2016-06-05 VITALS — BP 100/59 | HR 85 | Temp 98.1°F | Resp 17

## 2016-06-05 DIAGNOSIS — Z5111 Encounter for antineoplastic chemotherapy: Secondary | ICD-10-CM

## 2016-06-05 DIAGNOSIS — C8338 Diffuse large B-cell lymphoma, lymph nodes of multiple sites: Secondary | ICD-10-CM

## 2016-06-05 DIAGNOSIS — Z5112 Encounter for antineoplastic immunotherapy: Secondary | ICD-10-CM

## 2016-06-05 DIAGNOSIS — Z5189 Encounter for other specified aftercare: Secondary | ICD-10-CM | POA: Diagnosis not present

## 2016-06-05 DIAGNOSIS — R530 Neoplastic (malignant) related fatigue: Secondary | ICD-10-CM | POA: Diagnosis not present

## 2016-06-05 LAB — CBC WITH DIFFERENTIAL/PLATELET
BASO%: 0.8 % (ref 0.0–2.0)
Basophils Absolute: 0.1 10*3/uL (ref 0.0–0.1)
EOS%: 3.1 % (ref 0.0–7.0)
Eosinophils Absolute: 0.2 10*3/uL (ref 0.0–0.5)
HCT: 37.7 % — ABNORMAL LOW (ref 38.4–49.9)
HGB: 12.7 g/dL — ABNORMAL LOW (ref 13.0–17.1)
LYMPH#: 0.6 10*3/uL — AB (ref 0.9–3.3)
LYMPH%: 8.4 % — AB (ref 14.0–49.0)
MCH: 32.5 pg (ref 27.2–33.4)
MCHC: 33.8 g/dL (ref 32.0–36.0)
MCV: 96.1 fL (ref 79.3–98.0)
MONO#: 1.3 10*3/uL — ABNORMAL HIGH (ref 0.1–0.9)
MONO%: 18 % — AB (ref 0.0–14.0)
NEUT#: 5 10*3/uL (ref 1.5–6.5)
NEUT%: 69.7 % (ref 39.0–75.0)
Platelets: 307 10*3/uL (ref 140–400)
RBC: 3.92 10*6/uL — AB (ref 4.20–5.82)
RDW: 15.1 % — ABNORMAL HIGH (ref 11.0–14.6)
WBC: 7.2 10*3/uL (ref 4.0–10.3)

## 2016-06-05 LAB — LACTATE DEHYDROGENASE: LDH: 210 U/L (ref 125–245)

## 2016-06-05 LAB — COMPREHENSIVE METABOLIC PANEL
ALK PHOS: 82 U/L (ref 40–150)
ALT: 16 U/L (ref 0–55)
AST: 22 U/L (ref 5–34)
Albumin: 3.1 g/dL — ABNORMAL LOW (ref 3.5–5.0)
Anion Gap: 11 mEq/L (ref 3–11)
BILIRUBIN TOTAL: 0.48 mg/dL (ref 0.20–1.20)
BUN: 14.5 mg/dL (ref 7.0–26.0)
CHLORIDE: 106 meq/L (ref 98–109)
CO2: 21 meq/L — AB (ref 22–29)
CREATININE: 1.1 mg/dL (ref 0.7–1.3)
Calcium: 9.4 mg/dL (ref 8.4–10.4)
EGFR: 65 mL/min/{1.73_m2} — ABNORMAL LOW (ref >90)
Glucose: 157 mg/dl — ABNORMAL HIGH (ref 70–140)
POTASSIUM: 4 meq/L (ref 3.5–5.1)
Sodium: 139 mEq/L (ref 136–145)
Total Protein: 6.2 g/dL — ABNORMAL LOW (ref 6.4–8.3)

## 2016-06-05 MED ORDER — SODIUM CHLORIDE 0.9 % IV SOLN
375.0000 mg/m2 | Freq: Once | INTRAVENOUS | Status: AC
  Administered 2016-06-05: 800 mg via INTRAVENOUS
  Filled 2016-06-05: qty 50

## 2016-06-05 MED ORDER — HEPARIN SOD (PORK) LOCK FLUSH 100 UNIT/ML IV SOLN
500.0000 [IU] | Freq: Once | INTRAVENOUS | Status: AC | PRN
  Administered 2016-06-05: 500 [IU]
  Filled 2016-06-05: qty 5

## 2016-06-05 MED ORDER — PALONOSETRON HCL INJECTION 0.25 MG/5ML
0.2500 mg | Freq: Once | INTRAVENOUS | Status: AC
  Administered 2016-06-05: 0.25 mg via INTRAVENOUS

## 2016-06-05 MED ORDER — SODIUM CHLORIDE 0.9% FLUSH
10.0000 mL | INTRAVENOUS | Status: DC | PRN
  Administered 2016-06-05: 10 mL
  Filled 2016-06-05: qty 10

## 2016-06-05 MED ORDER — DIPHENHYDRAMINE HCL 25 MG PO CAPS
ORAL_CAPSULE | ORAL | Status: AC
  Filled 2016-06-05: qty 2

## 2016-06-05 MED ORDER — PALONOSETRON HCL INJECTION 0.25 MG/5ML
INTRAVENOUS | Status: AC
  Filled 2016-06-05: qty 5

## 2016-06-05 MED ORDER — DEXAMETHASONE SODIUM PHOSPHATE 100 MG/10ML IJ SOLN
10.0000 mg | Freq: Once | INTRAMUSCULAR | Status: AC
  Administered 2016-06-05: 10 mg via INTRAVENOUS
  Filled 2016-06-05: qty 1

## 2016-06-05 MED ORDER — DIPHENHYDRAMINE HCL 25 MG PO CAPS
50.0000 mg | ORAL_CAPSULE | Freq: Once | ORAL | Status: AC
  Administered 2016-06-05: 50 mg via ORAL

## 2016-06-05 MED ORDER — VINCRISTINE SULFATE CHEMO INJECTION 1 MG/ML
2.0000 mg | Freq: Once | INTRAVENOUS | Status: AC
  Administered 2016-06-05: 2 mg via INTRAVENOUS
  Filled 2016-06-05: qty 2

## 2016-06-05 MED ORDER — SODIUM CHLORIDE 0.9 % IV SOLN
750.0000 mg/m2 | Freq: Once | INTRAVENOUS | Status: AC
  Administered 2016-06-05: 1560 mg via INTRAVENOUS
  Filled 2016-06-05: qty 78

## 2016-06-05 MED ORDER — PEGFILGRASTIM 6 MG/0.6ML ~~LOC~~ PSKT
6.0000 mg | PREFILLED_SYRINGE | Freq: Once | SUBCUTANEOUS | Status: AC
  Administered 2016-06-05: 6 mg via SUBCUTANEOUS
  Filled 2016-06-05: qty 0.6

## 2016-06-05 MED ORDER — SODIUM CHLORIDE 0.9 % IV SOLN
Freq: Once | INTRAVENOUS | Status: AC
  Administered 2016-06-05: 10:00:00 via INTRAVENOUS

## 2016-06-05 MED ORDER — ACETAMINOPHEN 325 MG PO TABS
ORAL_TABLET | ORAL | Status: AC
  Filled 2016-06-05: qty 2

## 2016-06-05 MED ORDER — DOXORUBICIN HCL CHEMO IV INJECTION 2 MG/ML
50.0000 mg/m2 | Freq: Once | INTRAVENOUS | Status: AC
  Administered 2016-06-05: 104 mg via INTRAVENOUS
  Filled 2016-06-05: qty 52

## 2016-06-05 MED ORDER — ACETAMINOPHEN 325 MG PO TABS
650.0000 mg | ORAL_TABLET | Freq: Once | ORAL | Status: AC
  Administered 2016-06-05: 650 mg via ORAL

## 2016-06-05 NOTE — Progress Notes (Signed)
[place] [phone]   [phone]  OFFICE PROGRESS NOTE   [name], MD [place] [address]  DIAGNOSIS: Stage II large B-cell non-Hodgkin lymphoma diagnosed in August 2017.   PRIOR THERAPY: None.  CURRENT THERAPY: Systemic chemotherapy with CHOP/Rituxan every 3 weeks with Neulasta support. First dose 04/24/2016. Status post 2 cycles  INTERVAL HISTORY: Seth Gilbert 80 y.o. male returns to the clinic today for follow-up visit accompanied by his wife. The patient is feeling fine today with no specific complaints. He tolerated the second cycle of systemic chemotherapy with CHOP/Rituxan fairly well with no significant adverse effect except for fatigue that lasted a few more days in the previous cycle. He has no significant weight loss or night sweats. He denied having any significant chest pain, shortness of breath, cough or hemoptysis. He denied having any nausea, vomiting, diarrhea or constipation. He has no fever or chills. He is here today to start cycle #3.  MEDICAL HISTORY: Past Medical History:  Diagnosis Date  . Diverticulitis   . GERD (gastroesophageal reflux disease)    Heartburn at times  . Heart murmur    "comes and goes" onset 4th grade - last time anyone heard it was 01/2014  . History of hiatal hernia   . History of kidney stones   . HOH (hard of hearing)   . PONV (postoperative nausea and vomiting)   . Rosacea   . Wears glasses     ALLERGIES:  is allergic to no known allergies.  MEDICATIONS:  Current Outpatient Prescriptions  Medication Sig Dispense Refill  . allopurinol (ZYLOPRIM) 100 MG tablet Take 1 tablet (100 mg total) by mouth 2 (two) times daily. 60 tablet 3  . ampicillin (PRINCIPEN) 500 MG capsule Take 500 mg by mouth daily.     . famotidine (PEPCID) 40 MG tablet Take 40 mg by mouth daily as needed for heartburn.     . Glucosamine-Chondroit-Vit C-Mn (GLUCOSAMINE 1500 COMPLEX) CAPS Take 3  capsules by mouth every morning.     Marland Kitchen HYDROcodone-acetaminophen (NORCO/VICODIN) 5-325 MG tablet Take 1 tablet by mouth every 6 (six) hours as needed for moderate pain. (Patient not taking: Reported on 04/17/2016) 15 tablet 0  . lidocaine-prilocaine (EMLA) cream Apply 1 application topically as needed. 30 g 0  . metroNIDAZOLE (METROGEL) 0.75 % gel Apply 1 application topically daily.     . Multiple Vitamins-Minerals (CENTRUM SILVER PO) Take 1 tablet by mouth daily.     . predniSONE (DELTASONE) 50 MG tablet 2 tablets by mouth daily for 5 days every chemotherapy cycle starting with the first day of the chemotherapy. (Patient not taking: Reported on 05/01/2016) 60 tablet 0  . prochlorperazine (COMPAZINE) 10 MG tablet Take 1 tablet (10 mg total) by mouth every 6 (six) hours as needed for nausea or vomiting. 30 tablet 0  . senna (SENOKOT) 8.6 MG tablet Take 1 tablet by mouth daily as needed for constipation.    . vitamin E 400 UNIT capsule Take 400 Units by mouth daily.     No current facility-administered medications for this visit.     SURGICAL HISTORY:  Past Surgical History:  Procedure Laterality Date  . APPENDECTOMY    . COLONOSCOPY    . HEMORROIDECTOMY     inflammed anal gland removed  . INGUINAL HERNIA REPAIR Left 02/11/2014   Procedure: LEFT INGUINAL HERNIA REPAIR WITH MESH ;  Surgeon: Joyice Faster. Cornett, MD;  Location: [facility];  Service: General;  Laterality: Left;  . INSERTION OF MESH N/A 02/11/2014   Procedure: INSERTION OF MESH;  Surgeon: Joyice Faster. Cornett, MD;  Location: [place];  Service: General;  Laterality: N/A;  . PORTACATH PLACEMENT Left 04/10/2016   Procedure: INSERTION PORT-A-CATH into LEFT Subclavian Vein with guidance from ultrasound and flouroscopy;  Surgeon: Grace Isaac, MD;  Location: [place];  Service: Thoracic;  Laterality: Left;  . TONSILLECTOMY    . VIDEO BRONCHOSCOPY WITH ENDOBRONCHIAL ULTRASOUND N/A 03/30/2016   Procedure: VIDEO  BRONCHOSCOPY WITH ENDOBRONCHIAL ULTRASOUND,WITH TRANSBRONCHIAL BIOPSY;  Surgeon: Grace Isaac, MD;  Location: [facility] OR;  Service: Thoracic;  Laterality: N/A;    REVIEW OF SYSTEMS:  A comprehensive review of systems was negative.   PHYSICAL EXAMINATION: General appearance: alert, cooperative and no distress Head: Normocephalic, without obvious abnormality, atraumatic Neck: no adenopathy, no JVD, supple, symmetrical, trachea midline and thyroid not enlarged, symmetric, no tenderness/mass/nodules Lymph nodes: Cervical, supraclavicular, and axillary nodes normal. Resp: clear to auscultation bilaterally Back: symmetric, no curvature. ROM normal. No CVA tenderness. Cardio: regular rate and rhythm, S1, S2 normal, no murmur, click, rub or gallop GI: soft, non-tender; bowel sounds normal; no masses,  no organomegaly Extremities: extremities normal, atraumatic, no cyanosis or edema Neurologic: Alert and oriented X 3, normal strength and tone. Normal symmetric reflexes. Normal coordination and gait  ECOG PERFORMANCE STATUS: 0 - Asymptomatic  Blood pressure (!) 119/58, pulse (!) 108, temperature 98.6 F (37 C), temperature source Oral, resp. rate 17, height 5\' 8"  (1.727 m), weight 191 lb 6.4 oz (86.8 kg), SpO2 96 %.  LABORATORY DATA: Lab Results  Component Value Date   WBC 7.2 06/05/2016   HGB 12.7 (L) 06/05/2016   HCT 37.7 (L) 06/05/2016   MCV 96.1 06/05/2016   PLT 307 06/05/2016      Chemistry      Component Value Date/Time   NA 139 05/29/2016 1132   K 4.1 05/29/2016 1132   CL 107 03/30/2016 0856   CO2 25 05/29/2016 1132   BUN 15.7 05/29/2016 1132   CREATININE 1.1 05/29/2016 1132      Component Value Date/Time   CALCIUM 9.3 05/29/2016 1132   ALKPHOS 92 05/29/2016 1132   AST 18 05/29/2016 1132   ALT 18 05/29/2016 1132   BILITOT 0.61 05/29/2016 1132       RADIOGRAPHIC STUDIES: No results found.  ASSESSMENT AND PLAN: This is a very pleasant 80 years old white male with  recently diagnosed at least a stage II large B-cell non-Hodgkin lymphoma involving the lymph nodes in the chest and abdomen. There is no involvement of the bone marrow or the liver or spleen diagnosed in August 2017. The patient is currently undergoing systemic chemotherapy with CHOP/Rituxan is status post 2 cycles. He tolerated the second cycle of his treatment well. I recommended for the patient to proceed with cycle #3 today as scheduled. He will come back for follow-up visit in 3 weeks for evaluation before starting cycle #4 after repeating CT scan of the chest, abdomen and pelvis for restaging of his disease. There is advised to call immediately if he has any concerning symptoms in the interval. The patient voices understanding of current disease status and treatment options and is in agreement with the current care plan.  All questions were answered. The patient knows to call the clinic with any problems, questions or concerns. We can certainly see the patient much sooner if necessary.  Disclaimer: This note was dictated  with voice recognition software. Similar sounding words can inadvertently be transcribed and may not be corrected upon review.

## 2016-06-05 NOTE — Telephone Encounter (Signed)
Appointments complete per 10/10 los. Central radiology will call re scan. Patient will get print out in infusion area.

## 2016-06-05 NOTE — Patient Instructions (Signed)
[facility] Discharge Instructions for Patients Receiving Chemotherapy  Today you received the following chemotherapy agents:  Rituxan (rituximab), Cytoxan (cyclophosphamide), Vincristine (oncovorin), Adriamycin (doxorubicin)  To help prevent nausea and vomiting after your treatment, we encourage you to take your nausea medication as prescribed.   If you develop nausea and vomiting that is not controlled by your nausea medication, call the clinic.   BELOW ARE SYMPTOMS THAT SHOULD BE REPORTED IMMEDIATELY:  *FEVER GREATER THAN 100.5 F  *CHILLS WITH OR WITHOUT FEVER  NAUSEA AND VOMITING THAT IS NOT CONTROLLED WITH YOUR NAUSEA MEDICATION  *UNUSUAL SHORTNESS OF BREATH  *UNUSUAL BRUISING OR BLEEDING  TENDERNESS IN MOUTH AND THROAT WITH OR WITHOUT PRESENCE OF ULCERS  *URINARY PROBLEMS  *BOWEL PROBLEMS  UNUSUAL RASH Items with * indicate a potential emergency and should be followed up as soon as possible.  Feel free to call the clinic you have any questions or concerns. The clinic phone number is [phone].  Please show the [facility] at check-in to the Emergency Department and triage nurse.

## 2016-06-12 ENCOUNTER — Telehealth: Payer: Self-pay | Admitting: *Deleted

## 2016-06-12 ENCOUNTER — Other Ambulatory Visit (HOSPITAL_BASED_OUTPATIENT_CLINIC_OR_DEPARTMENT_OTHER): Payer: Medicare Other

## 2016-06-12 DIAGNOSIS — C8338 Diffuse large B-cell lymphoma, lymph nodes of multiple sites: Secondary | ICD-10-CM

## 2016-06-12 LAB — CBC WITH DIFFERENTIAL/PLATELET
BASO%: 2.9 % — ABNORMAL HIGH (ref 0.0–2.0)
BASOS ABS: 0 10*3/uL (ref 0.0–0.1)
EOS ABS: 0.1 10*3/uL (ref 0.0–0.5)
EOS%: 20 % — ABNORMAL HIGH (ref 0.0–7.0)
HEMATOCRIT: 31.9 % — AB (ref 38.4–49.9)
HGB: 10.9 g/dL — ABNORMAL LOW (ref 13.0–17.1)
LYMPH#: 0.1 10*3/uL — AB (ref 0.9–3.3)
LYMPH%: 37.1 % (ref 14.0–49.0)
MCH: 32.3 pg (ref 27.2–33.4)
MCHC: 34.2 g/dL (ref 32.0–36.0)
MCV: 94.7 fL (ref 79.3–98.0)
MONO#: 0.1 10*3/uL (ref 0.1–0.9)
MONO%: 17.1 % — ABNORMAL HIGH (ref 0.0–14.0)
NEUT#: 0.1 10*3/uL — CL (ref 1.5–6.5)
NEUT%: 22.9 % — AB (ref 39.0–75.0)
PLATELETS: 39 10*3/uL — AB (ref 140–400)
RBC: 3.37 10*6/uL — ABNORMAL LOW (ref 4.20–5.82)
RDW: 15.2 % — ABNORMAL HIGH (ref 11.0–14.6)
WBC: 0.4 10*3/uL — CL (ref 4.0–10.3)

## 2016-06-12 LAB — COMPREHENSIVE METABOLIC PANEL
ALBUMIN: 2.7 g/dL — AB (ref 3.5–5.0)
ALK PHOS: 73 U/L (ref 40–150)
ALT: 30 U/L (ref 0–55)
ANION GAP: 8 meq/L (ref 3–11)
AST: 15 U/L (ref 5–34)
BUN: 22.1 mg/dL (ref 7.0–26.0)
CALCIUM: 8.4 mg/dL (ref 8.4–10.4)
CHLORIDE: 103 meq/L (ref 98–109)
CO2: 23 mEq/L (ref 22–29)
CREATININE: 1 mg/dL (ref 0.7–1.3)
EGFR: 73 mL/min/{1.73_m2} — ABNORMAL LOW (ref >90)
Glucose: 183 mg/dl — ABNORMAL HIGH (ref 70–140)
POTASSIUM: 4.2 meq/L (ref 3.5–5.1)
Sodium: 134 mEq/L — ABNORMAL LOW (ref 136–145)
Total Bilirubin: 1.2 mg/dL (ref 0.20–1.20)
Total Protein: 5.2 g/dL — ABNORMAL LOW (ref 6.4–8.3)

## 2016-06-12 LAB — LACTATE DEHYDROGENASE: LDH: 147 U/L (ref 125–245)

## 2016-06-12 NOTE — Telephone Encounter (Signed)
Notified pt of lab results discussed neutropenic precautions. Pt verbalized understanding no further concerns.

## 2016-06-16 ENCOUNTER — Other Ambulatory Visit: Payer: Self-pay

## 2016-06-16 ENCOUNTER — Emergency Department (HOSPITAL_COMMUNITY): Payer: Medicare Other

## 2016-06-16 ENCOUNTER — Encounter (HOSPITAL_COMMUNITY): Payer: Self-pay | Admitting: *Deleted

## 2016-06-16 ENCOUNTER — Inpatient Hospital Stay (HOSPITAL_COMMUNITY)
Admission: EM | Admit: 2016-06-16 | Discharge: 2016-06-19 | DRG: 871 | Disposition: A | Discharge status: Home / Self Care | Payer: Medicare Other | Attending: Internal Medicine | Admitting: Internal Medicine

## 2016-06-16 DIAGNOSIS — R0602 Shortness of breath: Secondary | ICD-10-CM | POA: Diagnosis not present

## 2016-06-16 DIAGNOSIS — J189 Pneumonia, unspecified organism: Secondary | ICD-10-CM | POA: Diagnosis present

## 2016-06-16 DIAGNOSIS — T451X5A Adverse effect of antineoplastic and immunosuppressive drugs, initial encounter: Secondary | ICD-10-CM | POA: Diagnosis not present

## 2016-06-16 DIAGNOSIS — D61810 Antineoplastic chemotherapy induced pancytopenia: Secondary | ICD-10-CM | POA: Diagnosis present

## 2016-06-16 DIAGNOSIS — Z825 Family history of asthma and other chronic lower respiratory diseases: Secondary | ICD-10-CM | POA: Diagnosis not present

## 2016-06-16 DIAGNOSIS — R05 Cough: Secondary | ICD-10-CM

## 2016-06-16 DIAGNOSIS — E872 Acidosis, unspecified: Secondary | ICD-10-CM

## 2016-06-16 DIAGNOSIS — Z87442 Personal history of urinary calculi: Secondary | ICD-10-CM

## 2016-06-16 DIAGNOSIS — R0600 Dyspnea, unspecified: Secondary | ICD-10-CM

## 2016-06-16 DIAGNOSIS — K219 Gastro-esophageal reflux disease without esophagitis: Secondary | ICD-10-CM | POA: Diagnosis present

## 2016-06-16 DIAGNOSIS — C801 Malignant (primary) neoplasm, unspecified: Secondary | ICD-10-CM

## 2016-06-16 DIAGNOSIS — C8338 Diffuse large B-cell lymphoma, lymph nodes of multiple sites: Secondary | ICD-10-CM | POA: Diagnosis present

## 2016-06-16 DIAGNOSIS — J9601 Acute respiratory failure with hypoxia: Secondary | ICD-10-CM | POA: Diagnosis not present

## 2016-06-16 DIAGNOSIS — E860 Dehydration: Secondary | ICD-10-CM | POA: Diagnosis not present

## 2016-06-16 DIAGNOSIS — Z8249 Family history of ischemic heart disease and other diseases of the circulatory system: Secondary | ICD-10-CM

## 2016-06-16 DIAGNOSIS — R509 Fever, unspecified: Secondary | ICD-10-CM

## 2016-06-16 DIAGNOSIS — R059 Cough, unspecified: Secondary | ICD-10-CM

## 2016-06-16 DIAGNOSIS — K5900 Constipation, unspecified: Secondary | ICD-10-CM | POA: Diagnosis present

## 2016-06-16 DIAGNOSIS — R0682 Tachypnea, not elsewhere classified: Secondary | ICD-10-CM

## 2016-06-16 DIAGNOSIS — H9190 Unspecified hearing loss, unspecified ear: Secondary | ICD-10-CM | POA: Diagnosis present

## 2016-06-16 DIAGNOSIS — A419 Sepsis, unspecified organism: Secondary | ICD-10-CM | POA: Diagnosis not present

## 2016-06-16 HISTORY — DX: Malignant (primary) neoplasm, unspecified: C80.1

## 2016-06-16 LAB — CBC WITH DIFFERENTIAL/PLATELET
Basophils Absolute: 0 10*3/uL (ref 0.0–0.1)
Basophils Relative: 1 %
EOS PCT: 1 %
Eosinophils Absolute: 0 10*3/uL (ref 0.0–0.7)
HEMATOCRIT: 32 % — AB (ref 39.0–52.0)
Hemoglobin: 11.2 g/dL — ABNORMAL LOW (ref 13.0–17.0)
LYMPHS ABS: 0.3 10*3/uL — AB (ref 0.7–4.0)
Lymphocytes Relative: 7 %
MCH: 32.9 pg (ref 26.0–34.0)
MCHC: 35 g/dL (ref 30.0–36.0)
MCV: 94.1 fL (ref 78.0–100.0)
MONOS PCT: 5 %
Monocytes Absolute: 0.2 10*3/uL (ref 0.1–1.0)
NEUTROS ABS: 3.5 10*3/uL (ref 1.7–7.7)
Neutrophils Relative %: 86 %
Platelets: 63 10*3/uL — ABNORMAL LOW (ref 150–400)
RBC: 3.4 MIL/uL — ABNORMAL LOW (ref 4.22–5.81)
RDW: 15.2 % (ref 11.5–15.5)
WBC: 4 10*3/uL (ref 4.0–10.5)

## 2016-06-16 LAB — COMPREHENSIVE METABOLIC PANEL
ALBUMIN: 3.2 g/dL — AB (ref 3.5–5.0)
ALT: 31 U/L (ref 17–63)
AST: 32 U/L (ref 15–41)
Alkaline Phosphatase: 68 U/L (ref 38–126)
Anion gap: 8 (ref 5–15)
BILIRUBIN TOTAL: 1.1 mg/dL (ref 0.3–1.2)
BUN: 20 mg/dL (ref 6–20)
CO2: 20 mmol/L — ABNORMAL LOW (ref 22–32)
CREATININE: 1.08 mg/dL (ref 0.61–1.24)
Calcium: 8.5 mg/dL — ABNORMAL LOW (ref 8.9–10.3)
Chloride: 106 mmol/L (ref 101–111)
GFR calc Af Amer: >60 mL/min (ref >60)
GFR calc non Af Amer: >60 mL/min (ref >60)
GLUCOSE: 167 mg/dL — AB (ref 65–99)
POTASSIUM: 4 mmol/L (ref 3.5–5.1)
Sodium: 134 mmol/L — ABNORMAL LOW (ref 135–145)
TOTAL PROTEIN: 5.9 g/dL — AB (ref 6.5–8.1)

## 2016-06-16 LAB — URINALYSIS, ROUTINE W REFLEX MICROSCOPIC
BILIRUBIN URINE: NEGATIVE
GLUCOSE, UA: NEGATIVE mg/dL
HGB URINE DIPSTICK: NEGATIVE
KETONES UR: NEGATIVE mg/dL
Leukocytes, UA: NEGATIVE
Nitrite: NEGATIVE
PH: 6 (ref 5.0–8.0)
Protein, ur: NEGATIVE mg/dL
Specific Gravity, Urine: 1.026 (ref 1.005–1.030)

## 2016-06-16 LAB — MRSA PCR SCREENING: MRSA BY PCR: NEGATIVE

## 2016-06-16 LAB — INFLUENZA PANEL BY PCR (TYPE A & B)
H1N1FLUPCR: NOT DETECTED
INFLAPCR: NEGATIVE
Influenza B By PCR: NEGATIVE

## 2016-06-16 LAB — I-STAT CG4 LACTIC ACID, ED: LACTIC ACID, VENOUS: 2.51 mmol/L — AB (ref 0.5–1.9)

## 2016-06-16 LAB — LACTIC ACID, PLASMA: LACTIC ACID, VENOUS: 3.3 mmol/L — AB (ref 0.5–1.9)

## 2016-06-16 MED ORDER — SODIUM CHLORIDE 0.9 % IV BOLUS (SEPSIS)
1000.0000 mL | Freq: Once | INTRAVENOUS | Status: AC
  Administered 2016-06-16: 1000 mL via INTRAVENOUS

## 2016-06-16 MED ORDER — SODIUM CHLORIDE 0.9% FLUSH
3.0000 mL | Freq: Two times a day (BID) | INTRAVENOUS | Status: DC
  Administered 2016-06-16 – 2016-06-18 (×2): 3 mL via INTRAVENOUS

## 2016-06-16 MED ORDER — ACETAMINOPHEN 325 MG PO TABS
650.0000 mg | ORAL_TABLET | Freq: Four times a day (QID) | ORAL | Status: DC | PRN
  Administered 2016-06-17: 650 mg via ORAL
  Filled 2016-06-16: qty 2

## 2016-06-16 MED ORDER — ONDANSETRON HCL 4 MG PO TABS: 4.0000 mg | ORAL_TABLET | Freq: Four times a day (QID) | ORAL | Status: DC | PRN

## 2016-06-16 MED ORDER — PIPERACILLIN-TAZOBACTAM 3.375 G IVPB 30 MIN
3.3750 g | Freq: Once | INTRAVENOUS | Status: AC
  Administered 2016-06-16: 3.375 g via INTRAVENOUS
  Filled 2016-06-16: qty 50

## 2016-06-16 MED ORDER — VITAMIN E 180 MG (400 UNIT) PO CAPS
400.0000 [IU] | ORAL_CAPSULE | Freq: Every day | ORAL | Status: DC
  Administered 2016-06-16 – 2016-06-19 (×4): 400 [IU] via ORAL
  Filled 2016-06-16 (×4): qty 1

## 2016-06-16 MED ORDER — HYDROCODONE-ACETAMINOPHEN 5-325 MG PO TABS: 1.0000 | ORAL_TABLET | Freq: Four times a day (QID) | ORAL | Status: DC | PRN

## 2016-06-16 MED ORDER — ADULT MULTIVITAMIN W/MINERALS CH
ORAL_TABLET | Freq: Every day | ORAL | Status: DC
Start: 2016-06-16 — End: 2016-06-19
  Administered 2016-06-16 – 2016-06-19 (×4): 1 via ORAL
  Filled 2016-06-16 (×4): qty 1

## 2016-06-16 MED ORDER — ALLOPURINOL 100 MG PO TABS
100.0000 mg | ORAL_TABLET | Freq: Two times a day (BID) | ORAL | Status: DC
  Administered 2016-06-16 – 2016-06-19 (×7): 100 mg via ORAL
  Filled 2016-06-16 (×7): qty 1

## 2016-06-16 MED ORDER — AZITHROMYCIN 250 MG PO TABS
250.0000 mg | ORAL_TABLET | Freq: Every day | ORAL | Status: DC
  Administered 2016-06-17: 250 mg via ORAL
  Filled 2016-06-16: qty 1

## 2016-06-16 MED ORDER — SODIUM CHLORIDE 0.9 % IV SOLN: 250.0000 mL | INTRAVENOUS | Status: DC | PRN

## 2016-06-16 MED ORDER — VANCOMYCIN HCL IN DEXTROSE 1-5 GM/200ML-% IV SOLN
1000.0000 mg | Freq: Once | INTRAVENOUS | Status: DC
Start: 2016-06-16 — End: 2016-06-16
  Filled 2016-06-16: qty 200

## 2016-06-16 MED ORDER — AZITHROMYCIN 250 MG PO TABS
500.0000 mg | ORAL_TABLET | Freq: Every day | ORAL | Status: AC
  Administered 2016-06-16: 500 mg via ORAL
  Filled 2016-06-16: qty 2

## 2016-06-16 MED ORDER — VANCOMYCIN HCL IN DEXTROSE 750-5 MG/150ML-% IV SOLN
750.0000 mg | Freq: Two times a day (BID) | INTRAVENOUS | Status: DC
  Administered 2016-06-17 (×3): 750 mg via INTRAVENOUS
  Filled 2016-06-16 (×4): qty 150

## 2016-06-16 MED ORDER — SODIUM CHLORIDE 0.9 % IV SOLN: 1000.0000 mL | INTRAVENOUS | Status: DC

## 2016-06-16 MED ORDER — SODIUM CHLORIDE 0.9 % IV SOLN
INTRAVENOUS | Status: AC
  Administered 2016-06-16 – 2016-06-17 (×3): via INTRAVENOUS

## 2016-06-16 MED ORDER — ACETAMINOPHEN 650 MG RE SUPP: 650.0000 mg | Freq: Four times a day (QID) | RECTAL | Status: DC | PRN

## 2016-06-16 MED ORDER — SENNA 8.6 MG PO TABS: 1.0000 | ORAL_TABLET | Freq: Every day | ORAL | Status: DC | PRN

## 2016-06-16 MED ORDER — SODIUM CHLORIDE 0.9% FLUSH: 3.0000 mL | INTRAVENOUS | Status: DC | PRN

## 2016-06-16 MED ORDER — PIPERACILLIN-TAZOBACTAM 3.375 G IVPB
3.3750 g | Freq: Three times a day (TID) | INTRAVENOUS | Status: DC
  Administered 2016-06-16 – 2016-06-18 (×5): 3.375 g via INTRAVENOUS
  Filled 2016-06-16 (×4): qty 50

## 2016-06-16 MED ORDER — ONDANSETRON HCL 4 MG/2ML IJ SOLN: 4.0000 mg | Freq: Four times a day (QID) | INTRAMUSCULAR | Status: DC | PRN

## 2016-06-16 MED ORDER — VANCOMYCIN HCL 10 G IV SOLR
1500.0000 mg | INTRAVENOUS | Status: AC
  Administered 2016-06-16: 1500 mg via INTRAVENOUS
  Filled 2016-06-16: qty 1500

## 2016-06-16 NOTE — H&P (Addendum)
History and Physical    ALFREAD Gilbert [id number] DOB: May 28, 1936 DOA: 06/16/2016    PCP: [name], MD  Patient coming from: Home  Chief Complaint: Fever and chills  HPI: Seth Gilbert is a 80 y.o. male with medical history significant of stage II large B cell non-Hodgkin's lymphoma diagnosed in 8/17 on chemotherapy, nephrolithiasis presents with a fever & chills that started last night. Temperature 101.6 degrees.He received chemotherapy this last Tuesday and finished up a 5 day course of prednisone afterwards and has felt sick since He tells me he's had this irritating cough for almost one week now which is nonproductive. No sinus pain sinus drainage and runny nose or earache. No chest pain or shortness of breath on exertion. He has frequent micturition but this is unchanged. No dysuria or suprapubic pain. He's had mild nausea but no abdominal pain vomiting or diarrhea. No rash or headaches. .  ED Course: Sodium 134, CO2 20, lactic acid 251, hemoglobin 11.2, hematocrit 32, platelets 63  Review of Systems:  Mild weight loss of about 5 pounds in the past few weeks Feels weak overall All other systems reviewed and apart from HPI, are negative.  Past Medical History:  Diagnosis Date  . Cancer ([place])    NHL  . Diverticulitis   . GERD (gastroesophageal reflux disease)    Heartburn at times  . Heart murmur    "comes and goes" onset 4th grade - last time anyone heard it was 01/2014  . History of hiatal hernia   . History of kidney stones   . HOH (hard of hearing)   . PONV (postoperative nausea and vomiting)   . Rosacea   . Wears glasses     Past Surgical History:  Procedure Laterality Date  . APPENDECTOMY    . COLONOSCOPY    . HEMORROIDECTOMY     inflammed anal gland removed  . INGUINAL HERNIA REPAIR Left 02/11/2014   Procedure: LEFT INGUINAL HERNIA REPAIR WITH MESH ;  Surgeon: Seth Faster. Cornett, MD;  Location: [facility];  Service: General;  Laterality:  Left;  . INSERTION OF MESH N/A 02/11/2014   Procedure: INSERTION OF MESH;  Surgeon: Seth Faster. Cornett, MD;  Location: [facility];  Service: General;  Laterality: N/A;  . PORTACATH PLACEMENT Left 04/10/2016   Procedure: INSERTION PORT-A-CATH into LEFT Subclavian Vein with guidance from ultrasound and flouroscopy;  Surgeon: Seth Isaac, MD;  Location: [facility];  Service: Thoracic;  Laterality: Left;  . TONSILLECTOMY    . VIDEO BRONCHOSCOPY WITH ENDOBRONCHIAL ULTRASOUND N/A 03/30/2016   Procedure: VIDEO BRONCHOSCOPY WITH ENDOBRONCHIAL ULTRASOUND,WITH TRANSBRONCHIAL BIOPSY;  Surgeon: Seth Isaac, MD;  Location: [facility];  Service: Thoracic;  Laterality: N/A;    Social History:   reports that he has never smoked. He has never used smokeless tobacco. He reports that he drinks alcohol. He reports that he does not use drugs.  Lives with his wife, able to perform ADLs  Allergies  Allergen Reactions  . No Known Allergies     Family History  Problem Relation Age of Onset  . COPD Mother   . Heart disease Father      Prior to Admission medications   Medication Sig Start Date End Date Taking? Authorizing Provider  acetaminophen (TYLENOL) 500 MG tablet Take 1,000 mg by mouth every 6 (six) hours as needed for moderate pain.   Yes Historical Provider, MD  allopurinol (ZYLOPRIM) 100 MG tablet Take 1 tablet (100 mg total)  by mouth 2 (two) times daily. 04/17/16  Yes Curt Bears, MD  ampicillin (PRINCIPEN) 500 MG capsule Take 500 mg by mouth daily.    Yes Historical Provider, MD  famotidine (PEPCID) 40 MG tablet Take 40 mg by mouth daily as needed for heartburn.    Yes Historical Provider, MD  HYDROcodone-acetaminophen (NORCO/VICODIN) 5-325 MG tablet Take 1 tablet by mouth every 6 (six) hours as needed for moderate pain. 04/10/16  Yes Seth Isaac, MD  lidocaine-prilocaine (EMLA) cream Apply 1 application topically as needed. 04/17/16  Yes Curt Bears, MD  metroNIDAZOLE  (METROGEL) 0.75 % gel Apply 1 application topically daily.  12/04/13  Yes Historical Provider, MD  Multiple Vitamins-Minerals (CENTRUM SILVER PO) Take 1 tablet by mouth daily.    Yes Historical Provider, MD  predniSONE (DELTASONE) 50 MG tablet 2 tablets by mouth daily for 5 days every chemotherapy cycle starting with the first day of the chemotherapy. 04/17/16  Yes Curt Bears, MD  prochlorperazine (COMPAZINE) 10 MG tablet Take 1 tablet (10 mg total) by mouth every 6 (six) hours as needed for nausea or vomiting. 04/17/16  Yes Curt Bears, MD  senna (SENOKOT) 8.6 MG tablet Take 1 tablet by mouth daily as needed for constipation.   Yes Historical Provider, MD  vitamin E 400 UNIT capsule Take 400 Units by mouth daily.   Yes Historical Provider, MD    Physical Exam: Vitals:   06/16/16 1027 06/16/16 1037  BP: 90/58   Pulse: (!) 125   Resp: 18   Temp: 100.2 F (37.9 C)   TempSrc: Oral   SpO2: 92%   Weight:  86.6 kg (191 lb)  Height:  5\' 8"  (1.727 m)      Constitutional: NAD, calm, comfortable Eyes: PERTLA, lids and conjunctivae normal ENMT: Mucous membranes are Dry. Posterior pharynx clear of any exudate or lesions. Normal dentition.  Neck: normal, supple, no masses, no thyromegaly Respiratory: clear to auscultation bilaterally, no wheezing, no crackles. Normal respiratory effort. No accessory muscle use.  Cardiovascular: S1 & S2 heard, regular rate and rhythm, no murmurs / rubs / gallops. No extremity edema. 2+ pedal pulses. No carotid bruits.  Abdomen: No distension, no tenderness, no masses palpated. No hepatosplenomegaly. Bowel sounds normal.  Musculoskeletal: no clubbing / cyanosis. No joint deformity upper and lower extremities. Good ROM, no contractures. Normal muscle tone.  Skin: no rashes, lesions, ulcers. No induration Neurologic: CN 2-12 grossly intact. Sensation intact, DTR normal. Strength 5/5 in all 4 limbs.  Psychiatric: Normal judgment and insight. Alert and oriented  x 3. Normal mood.     Labs on Admission: I have personally reviewed following labs and imaging studies  CBC:  Recent Labs Lab 06/12/16 1128 06/16/16 1059  WBC 0.4* 4.0  NEUTROABS 0.1* 3.5  HGB 10.9* 11.2*  HCT 31.9* 32.0*  MCV 94.7 94.1  PLT 39* 63*   Basic Metabolic Panel:  Recent Labs Lab 06/12/16 1128 06/16/16 1059  NA 134* 134*  K 4.2 4.0  CL  --  106  CO2 23 20*  GLUCOSE 183* 167*  BUN 22.1 20  CREATININE 1.0 1.08  CALCIUM 8.4 8.5*   GFR: Estimated Creatinine Clearance: 60.4 mL/min (by C-G formula based on SCr of 1.08 mg/dL). Liver Function Tests:  Recent Labs Lab 06/12/16 1128 06/16/16 1059  AST 15 32  ALT 30 31  ALKPHOS 73 68  BILITOT 1.20 1.1  PROT 5.2* 5.9*  ALBUMIN 2.7* 3.2*   No results for input(s): LIPASE, AMYLASE in the last  168 hours. No results for input(s): AMMONIA in the last 168 hours. Coagulation Profile: No results for input(s): INR, PROTIME in the last 168 hours. Cardiac Enzymes: No results for input(s): CKTOTAL, CKMB, CKMBINDEX, TROPONINI in the last 168 hours. BNP (last 3 results) No results for input(s): PROBNP in the last 8760 hours. HbA1C: No results for input(s): HGBA1C in the last 72 hours. CBG: No results for input(s): GLUCAP in the last 168 hours. Lipid Profile: No results for input(s): CHOL, HDL, LDLCALC, TRIG, CHOLHDL, LDLDIRECT in the last 72 hours. Thyroid Function Tests: No results for input(s): TSH, T4TOTAL, FREET4, T3FREE, THYROIDAB in the last 72 hours. Anemia Panel: No results for input(s): VITAMINB12, FOLATE, FERRITIN, TIBC, IRON, RETICCTPCT in the last 72 hours. Urine analysis:    Component Value Date/Time   COLORURINE AMBER (A) 06/16/2016 1103   APPEARANCEUR CLEAR 06/16/2016 1103   LABSPEC 1.026 06/16/2016 1103   PHURINE 6.0 06/16/2016 1103   GLUCOSEU NEGATIVE 06/16/2016 1103   HGBUR NEGATIVE 06/16/2016 1103   BILIRUBINUR NEGATIVE 06/16/2016 1103   KETONESUR NEGATIVE 06/16/2016 1103   PROTEINUR  NEGATIVE 06/16/2016 1103   NITRITE NEGATIVE 06/16/2016 1103   LEUKOCYTESUR NEGATIVE 06/16/2016 1103   Sepsis Labs: @LABRCNTIP (procalcitonin:4,lacticidven:4) )No results found for this or any previous visit (from the past 240 hour(s)).   Radiological Exams on Admission: Dg Chest 2 View  Result Date: 06/16/2016 CLINICAL DATA:  Fever. Ongoing chemotherapy for non-Hodgkin's lymphoma. EXAM: CHEST  2 VIEW COMPARISON:  Chest radiograph 03/08/2016 FINDINGS: Left subclavian Port-A-Cath tip in the lower SVC region. Mild fullness at the left lung base is unchanged and probably related to cardiophrenic fat. Patient has known nodular densities in this area but not clearly appreciated on these radiographs. There is no focal airspace disease or pulmonary edema. Heart and mediastinum are within normal limits. The trachea is midline. Multilevel degenerative changes in the thoracic spine. No large pleural effusions. IMPRESSION: No acute chest abnormality. Electronically Signed   By: Markus Daft M.D.   On: 06/16/2016 11:15    EKG: Independently reviewed. Sinus tachycardia at 109 bpm  Assessment/Plan Principal Problem:   Fever - 101 at home with tachycardia and BP of 90/58>> Sepsis with respiratory source - With cough in an immune compromised patient  - pneumonia versus acute bronchitis -He has received Vanco and Zosyn in the ER-will add Zithromax -Obtain respiratory viral panel and flu swab -he has a Port-A-Cath-follow blood cultures and narrow antibiotics if negative -Currently not short of breath or requiring O2  Active Problems:   Diffuse large B-cell Non-hodgkins lymphoma of lymph nodes of multiple regions  -diagnosed 8/17 -CHOP/Rituxan Q 3 wks- Last chemotherapy which was his third chemotherapy was on the 10th of this month -Followed by Dr. Earlie Server    Lactic acidosis -Recheck after IV fluid boluses    Dehydration  - he will receive 3 L of normal saline per orders placed in the ER - will hold  off on further fluids  Anemia - Mild drop from 12 on 10/10 to11 may be secondary to chemotherapy  Thrombocytopenia -Dropped from 307 on 10/10-63 today also likely secondary to chemotherapy    DVT prophylaxis: SCD Code Status: Full code  Family Communication: wife  Disposition Plan:  Med/surg bed Consults called: none  Admission status: inpatient    [facility] MD [facility] Pager: [email or web address] Password [email or web address] 7PM-7AM, please contact night-coverage   06/16/2016, 1:06 PM

## 2016-06-16 NOTE — Progress Notes (Signed)
Pharmacy Antibiotic Note  Seth Gilbert is a 80 y.o. male with recently diagnosed stage II large B-cell NHL, last chemotherapy on 10/10, admitted on 06/16/2016 with febrile neutropenia.  Pharmacy has been consulted for Vancomycin and Zosyn dosing.  SCr = 1.08, CrCl ~65 ml/min (N57), ANC 0.1  Plan: Vancomycin 1500mg  IV x1, then 750mg  IV q12h Zosyn 3.375g IV q8h (infuse over 4 hours) F/u cultures, renal function, VT at Css, clinical course  Height: 5\' 8"  (172.7 cm) Weight: 191 lb (86.6 kg) IBW/kg (Calculated) : 68.4  Temp (24hrs), Avg:100.2 F (37.9 C), Min:100.2 F (37.9 C), Max:100.2 F (37.9 C)   Recent Labs Lab 06/12/16 1128 06/12/16 1128 06/16/16 1109  WBC 0.4*  --   --   CREATININE  --  1.0  --   LATICACIDVEN  --   --  2.51*    Estimated Creatinine Clearance: 65.2 mL/min (by C-G formula based on SCr of 1 mg/dL).    Allergies  Allergen Reactions  . No Known Allergies     Antimicrobials this admission: 10/21 Vancomycin >>  10/21 Zosyn >>   Dose adjustments this admission:  Microbiology results: 10/21 BCx: Collected 10/21 UCx: Collected   Thank you for allowing pharmacy to be a part of this patient's care.  Ralene Bathe, PharmD, BCPS 06/16/2016, 11:38 AM  Pager: [phone]

## 2016-06-16 NOTE — ED Notes (Signed)
EKG delay: Pt is in x-ray

## 2016-06-16 NOTE — ED Provider Notes (Signed)
[facility] DEPT Provider Note   CSN: [id number] Arrival date & time: 06/16/16  1020     History   Chief Complaint No chief complaint on file.   HPI Seth Gilbert is a 80 y.o. male.  80 year old male presents with two-day history of fever and body aches with some nonproductive cough. No vomiting or diarrhea. Has had myalgias as well as diffuse weakness without headache or photophobia. No neck pain. Patient currently undergoing treatment for non-Hodgkin's lymphoma and last chemotherapy was 11 days ago. Patient had blood work done 4 days ago which showed a neutropenic. Fever at home has been treated with Tylenol has been somewhat responsive. Denies any rashes. Chest abdominal discomfort. No urinary symptoms. Called his nurse and told to come here.      Past Medical History:  Diagnosis Date  . Cancer ([place])    NHL  . Diverticulitis   . GERD (gastroesophageal reflux disease)    Heartburn at times  . Heart murmur    "comes and goes" onset 4th grade - last time anyone heard it was 01/2014  . History of hiatal hernia   . History of kidney stones   . HOH (hard of hearing)   . PONV (postoperative nausea and vomiting)   . Rosacea   . Wears glasses     Patient Active Problem List   Diagnosis Date Noted  . Encounter for antineoplastic chemotherapy 04/17/2016  . NHL (non-Hodgkin's lymphoma) ([place]) 04/05/2016  . Diffuse large B-cell lymphoma of lymph nodes of multiple regions ([place]) 04/05/2016  . Lung mass 03/13/2016  . Post-operative state 03/08/2014  . Left inguinal hernia 05/08/2013    Past Surgical History:  Procedure Laterality Date  . APPENDECTOMY    . COLONOSCOPY    . HEMORROIDECTOMY     inflammed anal gland removed  . INGUINAL HERNIA REPAIR Left 02/11/2014   Procedure: LEFT INGUINAL HERNIA REPAIR WITH MESH ;  Surgeon: Joyice Faster. Cornett, MD;  Location: [facility];  Service: General;  Laterality: Left;  . INSERTION OF MESH N/A 02/11/2014   Procedure:  INSERTION OF MESH;  Surgeon: Joyice Faster. Cornett, MD;  Location: [facility];  Service: General;  Laterality: N/A;  . PORTACATH PLACEMENT Left 04/10/2016   Procedure: INSERTION PORT-A-CATH into LEFT Subclavian Vein with guidance from ultrasound and flouroscopy;  Surgeon: Grace Isaac, MD;  Location: [facility];  Service: Thoracic;  Laterality: Left;  . TONSILLECTOMY    . VIDEO BRONCHOSCOPY WITH ENDOBRONCHIAL ULTRASOUND N/A 03/30/2016   Procedure: VIDEO BRONCHOSCOPY WITH ENDOBRONCHIAL Gerrit Heck TRANSBRONCHIAL BIOPSY;  Surgeon: Grace Isaac, MD;  Location: [employer];  Service: Thoracic;  Laterality: N/A;       Home Medications    Prior to Admission medications   Medication Sig Start Date End Date Taking? Authorizing Provider  allopurinol (ZYLOPRIM) 100 MG tablet Take 1 tablet (100 mg total) by mouth 2 (two) times daily. 04/17/16   Curt Bears, MD  ampicillin (PRINCIPEN) 500 MG capsule Take 500 mg by mouth daily.     Historical Provider, MD  famotidine (PEPCID) 40 MG tablet Take 40 mg by mouth daily as needed for heartburn.     Historical Provider, MD  Glucosamine-Chondroit-Vit C-Mn (GLUCOSAMINE 1500 COMPLEX) CAPS Take 3 capsules by mouth every morning.     Historical Provider, MD  HYDROcodone-acetaminophen (NORCO/VICODIN) 5-325 MG tablet Take 1 tablet by mouth every 6 (six) hours as needed for moderate pain. Patient not taking: Reported on 06/05/2016 04/10/16   Grace Isaac,  MD  lidocaine-prilocaine (EMLA) cream Apply 1 application topically as needed. 04/17/16   Curt Bears, MD  metroNIDAZOLE (METROGEL) 0.75 % gel Apply 1 application topically daily.  12/04/13   Historical Provider, MD  Multiple Vitamins-Minerals (CENTRUM SILVER PO) Take 1 tablet by mouth daily.     Historical Provider, MD  predniSONE (DELTASONE) 50 MG tablet 2 tablets by mouth daily for 5 days every chemotherapy cycle starting with the first day of the chemotherapy. 04/17/16   Curt Bears, MD    prochlorperazine (COMPAZINE) 10 MG tablet Take 1 tablet (10 mg total) by mouth every 6 (six) hours as needed for nausea or vomiting. 04/17/16   Curt Bears, MD  senna (SENOKOT) 8.6 MG tablet Take 1 tablet by mouth daily as needed for constipation.    Historical Provider, MD  vitamin E 400 UNIT capsule Take 400 Units by mouth daily.    Historical Provider, MD    Family History Family History  Problem Relation Age of Onset  . COPD Mother   . Heart disease Father     Social History Social History  Substance Use Topics  . Smoking status: Never Smoker  . Smokeless tobacco: Never Used  . Alcohol use Yes     Comment: occasional- a couple times a month     Allergies   No known allergies   Review of Systems Review of Systems  All other systems reviewed and are negative.    Physical Exam Updated Vital Signs BP 90/58 (BP Location: Right Arm)   Pulse (!) 125   Temp 100.2 F (37.9 C) (Oral)   Resp 18   Ht 5\' 8"  (1.727 m)   Wt 86.6 kg   SpO2 92%   BMI 29.04 kg/m   Physical Exam  Constitutional: He is oriented to person, place, and time. He appears well-developed and well-nourished.  Non-toxic appearance. No distress.  HENT:  Head: Normocephalic and atraumatic.  Eyes: Conjunctivae, EOM and lids are normal. Pupils are equal, round, and reactive to light.  Neck: Normal range of motion. Neck supple. No tracheal deviation present. No thyroid mass present.  Cardiovascular: Regular rhythm and normal heart sounds.  Tachycardia present.  Exam reveals no gallop.   No murmur heard. Pulmonary/Chest: Effort normal and breath sounds normal. No stridor. No respiratory distress. He has no decreased breath sounds. He has no wheezes. He has no rhonchi. He has no rales.  Abdominal: Soft. Normal appearance and bowel sounds are normal. He exhibits no distension. There is no tenderness. There is no rebound and no CVA tenderness.  Musculoskeletal: Normal range of motion. He exhibits no edema  or tenderness.  Neurological: He is alert and oriented to person, place, and time. He has normal strength. No cranial nerve deficit or sensory deficit. GCS eye subscore is 4. GCS verbal subscore is 5. GCS motor subscore is 6.  Skin: Skin is warm and dry. No abrasion and no rash noted.  Psychiatric: He has a normal mood and affect. His speech is normal and behavior is normal.  Nursing note and vitals reviewed.    ED Treatments / Results  Labs (all labs ordered are listed, but only abnormal results are displayed) Labs Reviewed  CULTURE, BLOOD (ROUTINE X 2)  CULTURE, BLOOD (ROUTINE X 2)  URINE CULTURE  COMPREHENSIVE METABOLIC PANEL  CBC WITH DIFFERENTIAL/PLATELET  URINALYSIS, ROUTINE W REFLEX MICROSCOPIC (NOT AT [facility])  I-STAT CG4 LACTIC ACID, ED    EKG  EKG Interpretation None       Radiology  No results found.  Procedures Procedures (including critical care time)  Medications Ordered in ED Medications  0.9 %  sodium chloride infusion (not administered)     Initial Impression / Assessment and Plan / ED Course  I have reviewed the triage vital signs and the nursing notes.  Pertinent labs & imaging results that were available during my care of the patient were reviewed by me and considered in my medical decision making (see chart for details).  Clinical Course   Code sepsis activated. Blood pressures noted as well. Patient's lactate noted and patient bolused with IV fluids as well as started on empiric antibiotics. Patient's result discuss with him and his wife. He is maintaining appropriate. Discuss with hospitalist and patient to be admitted  CRITICAL CARE Performed by: Leota Jacobsen Total critical care time: 50 minutes Critical care time was exclusive of separately billable procedures and treating other patients. Critical care was necessary to treat or prevent imminent or life-threatening deterioration. Critical care was time spent personally by me on the  following activities: development of treatment plan with patient and/or surrogate as well as nursing, discussions with consultants, evaluation of patient's response to treatment, examination of patient, obtaining history from patient or surrogate, ordering and performing treatments and interventions, ordering and review of laboratory studies, ordering and review of radiographic studies, pulse oximetry and re-evaluation of patient's condition.    Final Clinical Impressions(s) / ED Diagnoses   Final diagnoses:  None    New Prescriptions New Prescriptions   No medications on file     Lacretia Leigh, MD 06/16/16 1224

## 2016-06-16 NOTE — ED Triage Notes (Addendum)
Patient is undergoing chemotherapy for NHL.  Patient's last chemo tx was on 10/10. Patient had labs on 10/17 and was found to be neutropenic with a WBC of 0.4 and neutrophils of 0.1.  Last night, patient's wife noticed he was warm and he had a temp of 101.6.  The fever responded well to Tylenol.  Patient again spiked a fever this morning of 101.4.  Patient called the triage nurse at the cancer center and was directed here.  Patient c/o nausea since his last chemo.  He was treated that with compazine with some relief.  Patient denies pain.  Patient's wife states patient has had dry cough since last chemo tx as well.

## 2016-06-17 ENCOUNTER — Inpatient Hospital Stay (HOSPITAL_COMMUNITY): Payer: Medicare Other

## 2016-06-17 ENCOUNTER — Encounter (HOSPITAL_COMMUNITY): Payer: Self-pay | Admitting: Radiology

## 2016-06-17 DIAGNOSIS — C8338 Diffuse large B-cell lymphoma, lymph nodes of multiple sites: Secondary | ICD-10-CM

## 2016-06-17 DIAGNOSIS — A419 Sepsis, unspecified organism: Principal | ICD-10-CM

## 2016-06-17 DIAGNOSIS — E860 Dehydration: Secondary | ICD-10-CM

## 2016-06-17 DIAGNOSIS — E872 Acidosis: Secondary | ICD-10-CM

## 2016-06-17 DIAGNOSIS — R509 Fever, unspecified: Secondary | ICD-10-CM

## 2016-06-17 DIAGNOSIS — J189 Pneumonia, unspecified organism: Secondary | ICD-10-CM

## 2016-06-17 LAB — RESPIRATORY PANEL BY PCR
ADENOVIRUS-RVPPCR: NOT DETECTED
Bordetella pertussis: NOT DETECTED
CHLAMYDOPHILA PNEUMONIAE-RVPPCR: NOT DETECTED
CORONAVIRUS HKU1-RVPPCR: NOT DETECTED
CORONAVIRUS NL63-RVPPCR: NOT DETECTED
CORONAVIRUS OC43-RVPPCR: NOT DETECTED
Coronavirus 229E: NOT DETECTED
INFLUENZA A-RVPPCR: NOT DETECTED
Influenza B: NOT DETECTED
Metapneumovirus: NOT DETECTED
Mycoplasma pneumoniae: NOT DETECTED
PARAINFLUENZA VIRUS 1-RVPPCR: NOT DETECTED
PARAINFLUENZA VIRUS 2-RVPPCR: NOT DETECTED
PARAINFLUENZA VIRUS 3-RVPPCR: NOT DETECTED
PARAINFLUENZA VIRUS 4-RVPPCR: NOT DETECTED
RHINOVIRUS / ENTEROVIRUS - RVPPCR: NOT DETECTED
Respiratory Syncytial Virus: NOT DETECTED

## 2016-06-17 LAB — CBC
HCT: 26.6 % — ABNORMAL LOW (ref 39.0–52.0)
Hemoglobin: 9.2 g/dL — ABNORMAL LOW (ref 13.0–17.0)
MCH: 33 pg (ref 26.0–34.0)
MCHC: 34.6 g/dL (ref 30.0–36.0)
MCV: 95.3 fL (ref 78.0–100.0)
PLATELETS: 65 10*3/uL — AB (ref 150–400)
RBC: 2.79 MIL/uL — ABNORMAL LOW (ref 4.22–5.81)
RDW: 15.4 % (ref 11.5–15.5)
WBC: 3.2 10*3/uL — ABNORMAL LOW (ref 4.0–10.5)

## 2016-06-17 LAB — BASIC METABOLIC PANEL
Anion gap: 3 — ABNORMAL LOW (ref 5–15)
BUN: 16 mg/dL (ref 6–20)
CHLORIDE: 112 mmol/L — AB (ref 101–111)
CO2: 22 mmol/L (ref 22–32)
CREATININE: 1.03 mg/dL (ref 0.61–1.24)
Calcium: 8.1 mg/dL — ABNORMAL LOW (ref 8.9–10.3)
GFR calc Af Amer: >60 mL/min (ref >60)
GFR calc non Af Amer: >60 mL/min (ref >60)
Glucose, Bld: 123 mg/dL — ABNORMAL HIGH (ref 65–99)
Potassium: 3.7 mmol/L (ref 3.5–5.1)
Sodium: 137 mmol/L (ref 135–145)

## 2016-06-17 LAB — LACTIC ACID, PLASMA: Lactic Acid, Venous: 0.8 mmol/L (ref 0.5–1.9)

## 2016-06-17 LAB — URINE CULTURE

## 2016-06-17 MED ORDER — POLYETHYLENE GLYCOL 3350 17 G PO PACK: 17.0000 g | PACK | Freq: Every day | ORAL | Status: DC

## 2016-06-17 MED ORDER — PREDNISONE 20 MG PO TABS
60.0000 mg | ORAL_TABLET | Freq: Every day | ORAL | Status: DC
  Administered 2016-06-17 – 2016-06-19 (×3): 60 mg via ORAL
  Filled 2016-06-17 (×3): qty 3

## 2016-06-17 MED ORDER — SENNA 8.6 MG PO TABS
2.0000 | ORAL_TABLET | Freq: Every day | ORAL | Status: DC
  Administered 2016-06-17: 17.2 mg via ORAL
  Filled 2016-06-17 (×2): qty 2

## 2016-06-17 MED ORDER — IOPAMIDOL (ISOVUE-370) INJECTION 76%
100.0000 mL | Freq: Once | INTRAVENOUS | Status: AC | PRN
  Administered 2016-06-17: 100 mL via INTRAVENOUS

## 2016-06-17 MED ORDER — GUAIFENESIN-CODEINE 100-10 MG/5ML PO SOLN: 10.0000 mL | ORAL | Status: DC | PRN

## 2016-06-17 MED ORDER — ALBUTEROL SULFATE (2.5 MG/3ML) 0.083% IN NEBU: 2.5000 mg | INHALATION_SOLUTION | RESPIRATORY_TRACT | Status: DC | PRN

## 2016-06-17 NOTE — Progress Notes (Addendum)
PROGRESS NOTE  Seth Gilbert  [id number] DOB: [birth year] DOA: 06/16/2016 PCP: [name], MD  Brief Narrative:  Seth Gilbert is a 80 y.o. male with medical history significant of stage II large B cell non-Hodgkin's lymphoma diagnosed in 8/17 on chemotherapy, nephrolithiasis presented with a fever, chills, and shortness of breath.  CT angio chest was concerning for acute atypical pneumonia.  Continuing broad spectrum antibiotics pending blood cultures and adding atypical coverage with fluoroquinolone.  DDx includes drug reaction from [facility].    Assessment & Plan:   Principal Problem:   Fever Active Problems:   Diffuse large B-cell lymphoma of lymph nodes of multiple regions (HCC)   Lactic acidosis   Dehydration  Sepsis secondary to atypical pneumonia.  Blood pressure improved but wheezing and tachypneic.  At risk for ARDS.  Lactic acidosis has resolved.   -  Continue IVF -  Continue vancomycin and zosyn pending blood cultures, but if negative, may discontinue -  Start levofloxacin -  Start prednisone daily -  Trial of albuterol  -  Add guaifenesin with codeine for cough -  F/u blood cultures which are pending -  Urine culture negative -  S. pneumo ag -  Legionella ag -  Respiratory viral panel negative -  Prn bipap  Non-hodgkin's lymphoma -  Holding R-CHOP while being treated for infection  Pancytopenia without severe neutropenia due to chemotherapy - counts appear to be improving after last dose of chemotherapy  Hard of hearing -  Worsening with additional chemotherapy  Constipation -  Start miralax, senna, and bisacodyl  DVT prophylaxis:  SCD Code Status:  full Family Communication:  Patient alone, questions answered Disposition Plan:  Pending improvement in dyspnea.     Consultants:   Dr. Alen Blew by phone on 10/22  Dr. Julien Nordmann added to rounding list  Procedures:  none  Antimicrobials:   Vancomycin and zosyn 10/21   Levofloxacin 10/22     Subjective: Feels SOB and has a frequent wheezy cough that is bothersome.  He is constipated and it has been 3 days since last BM.    Objective: Vitals:   06/17/16 0200 06/17/16 0356 06/17/16 0400 06/17/16 0800  BP:   121/65   Pulse:   81   Resp:   (!) 26   Temp: 98.4 F (36.9 C) 97.8 F (36.6 C)  98 F (36.7 C)  TempSrc: Oral Oral  Oral  SpO2:   93%   Weight:      Height:        Intake/Output Summary (Last 24 hours) at 06/17/16 1321 Last data filed at 06/17/16 0000  Gross per 24 hour  Intake              290 ml  Output              350 ml  Net              -60 ml   Filed Weights   06/16/16 1037  Weight: 86.6 kg (191 lb)    Examination:  General exam:  Adult male.  No acute distress.  HEENT:  NCAT, MMM Respiratory system:  Wheezing throughout, diminished throughout Cardiovascular system:  Regular rate and rhythm, normal S1/S2. No murmurs, rubs, gallops or clicks.  Warm extremities Gastrointestinal system: Normal active bowel sounds, soft, mildly distended, nontender. MSK:  Normal tone and bulk, no lower extremity edema Neuro:  Grossly moves all extremities    Data Reviewed: I have personally reviewed following labs  and imaging studies  CBC:  Recent Labs Lab 06/12/16 1128 06/16/16 1059 06/17/16 0457  WBC 0.4* 4.0 3.2*  NEUTROABS 0.1* 3.5  --   HGB 10.9* 11.2* 9.2*  HCT 31.9* 32.0* 26.6*  MCV 94.7 94.1 95.3  PLT 39* 63* 65*   Basic Metabolic Panel:  Recent Labs Lab 06/12/16 1128 06/16/16 1059 06/17/16 0457  NA 134* 134* 137  K 4.2 4.0 3.7  CL  --  106 112*  CO2 23 20* 22  GLUCOSE 183* 167* 123*  BUN 22.1 20 16   CREATININE 1.0 1.08 1.03  CALCIUM 8.4 8.5* 8.1*   GFR: Estimated Creatinine Clearance: 63.3 mL/min (by C-G formula based on SCr of 1.03 mg/dL). Liver Function Tests:  Recent Labs Lab 06/12/16 1128 06/16/16 1059  AST 15 32  ALT 30 31  ALKPHOS 73 68  BILITOT 1.20 1.1  PROT 5.2* 5.9*  ALBUMIN 2.7* 3.2*   No results for  input(s): LIPASE, AMYLASE in the last 168 hours. No results for input(s): AMMONIA in the last 168 hours. Coagulation Profile: No results for input(s): INR, PROTIME in the last 168 hours. Cardiac Enzymes: No results for input(s): CKTOTAL, CKMB, CKMBINDEX, TROPONINI in the last 168 hours. BNP (last 3 results) No results for input(s): PROBNP in the last 8760 hours. HbA1C: No results for input(s): HGBA1C in the last 72 hours. CBG: No results for input(s): GLUCAP in the last 168 hours. Lipid Profile: No results for input(s): CHOL, HDL, LDLCALC, TRIG, CHOLHDL, LDLDIRECT in the last 72 hours. Thyroid Function Tests: No results for input(s): TSH, T4TOTAL, FREET4, T3FREE, THYROIDAB in the last 72 hours. Anemia Panel: No results for input(s): VITAMINB12, FOLATE, FERRITIN, TIBC, IRON, RETICCTPCT in the last 72 hours. Urine analysis:    Component Value Date/Time   COLORURINE AMBER (A) 06/16/2016 1103   APPEARANCEUR CLEAR 06/16/2016 1103   LABSPEC 1.026 06/16/2016 1103   PHURINE 6.0 06/16/2016 1103   GLUCOSEU NEGATIVE 06/16/2016 1103   HGBUR NEGATIVE 06/16/2016 1103   BILIRUBINUR NEGATIVE 06/16/2016 1103   KETONESUR NEGATIVE 06/16/2016 1103   PROTEINUR NEGATIVE 06/16/2016 1103   NITRITE NEGATIVE 06/16/2016 1103   LEUKOCYTESUR NEGATIVE 06/16/2016 1103   Sepsis Labs: @LABRCNTIP (procalcitonin:4,lacticidven:4)  ) Recent Results (from the past 240 hour(s))  Urine culture     Status: Abnormal   Collection Time: 06/16/16 11:03 AM  Result Value Ref Range Status   Specimen Description URINE, RANDOM  Final   Special Requests NONE  Final   Culture (A)  Final    <10,000 COLONIES/mL INSIGNIFICANT GROWTH Performed at [facility]    Report Status 06/17/2016 FINAL  Final  MRSA PCR Screening     Status: None   Collection Time: 06/16/16  2:15 PM  Result Value Ref Range Status   MRSA by PCR NEGATIVE NEGATIVE Final    Comment:        The GeneXpert MRSA Assay (FDA approved for NASAL  specimens only), is one component of a comprehensive MRSA colonization surveillance program. It is not intended to diagnose MRSA infection nor to guide or monitor treatment for MRSA infections.   Respiratory Panel by PCR     Status: None   Collection Time: 06/16/16  3:37 PM  Result Value Ref Range Status   Adenovirus NOT DETECTED NOT DETECTED Final   Coronavirus 229E NOT DETECTED NOT DETECTED Final   Coronavirus HKU1 NOT DETECTED NOT DETECTED Final   Coronavirus NL63 NOT DETECTED NOT DETECTED Final   Coronavirus OC43 NOT DETECTED NOT DETECTED Final  Metapneumovirus NOT DETECTED NOT DETECTED Final   Rhinovirus / Enterovirus NOT DETECTED NOT DETECTED Final   Influenza A NOT DETECTED NOT DETECTED Final   Influenza B NOT DETECTED NOT DETECTED Final   Parainfluenza Virus 1 NOT DETECTED NOT DETECTED Final   Parainfluenza Virus 2 NOT DETECTED NOT DETECTED Final   Parainfluenza Virus 3 NOT DETECTED NOT DETECTED Final   Parainfluenza Virus 4 NOT DETECTED NOT DETECTED Final   Respiratory Syncytial Virus NOT DETECTED NOT DETECTED Final   Bordetella pertussis NOT DETECTED NOT DETECTED Final   Chlamydophila pneumoniae NOT DETECTED NOT DETECTED Final   Mycoplasma pneumoniae NOT DETECTED NOT DETECTED Final    Comment: Performed at [facility]      Radiology Studies: Dg Chest 2 View  Result Date: 06/16/2016 CLINICAL DATA:  Fever. Ongoing chemotherapy for non-Hodgkin's lymphoma. EXAM: CHEST  2 VIEW COMPARISON:  Chest radiograph 03/08/2016 FINDINGS: Left subclavian Port-A-Cath tip in the lower SVC region. Mild fullness at the left lung base is unchanged and probably related to cardiophrenic fat. Patient has known nodular densities in this area but not clearly appreciated on these radiographs. There is no focal airspace disease or pulmonary edema. Heart and mediastinum are within normal limits. The trachea is midline. Multilevel degenerative changes in the thoracic spine. No large pleural  effusions. IMPRESSION: No acute chest abnormality. Electronically Signed   By: Markus Daft M.D.   On: 06/16/2016 11:15   Ct Angio Chest Pe W Or Wo Contrast  Result Date: 06/17/2016 CLINICAL DATA:  Shortness of breath and fevers EXAM: CT ANGIOGRAPHY CHEST WITH CONTRAST TECHNIQUE: Multidetector CT imaging of the chest was performed using the standard protocol during bolus administration of intravenous contrast. Multiplanar CT image reconstructions and MIPs were obtained to evaluate the vascular anatomy. CONTRAST:  100 mL Isovue 370. COMPARISON:  03/12/2016 FINDINGS: Cardiovascular: Thoracic aorta is within normal limits without significant atherosclerotic calcification or aneurysmal dilatation. Coronary calcifications are seen. The pulmonary artery is well visualized and demonstrates a normal branching pattern. No filling defects to suggest pulmonary emboli are identified. Mediastinum/Nodes: The thoracic inlet is within normal limits. No significant mediastinal adenopathy is noted. Small bilateral hilar lymph nodes are seen. These are roughly similar to that noted on the prior exam but evaluation is somewhat difficult due to lack of IV contrast. Previously seen subcarinal adenopathy has resolved as has the left infrahilar mass lesion. No axillary adenopathy is noted. Lungs/Pleura: Lungs are well aerated bilaterally and demonstrate a patchy ground-glass infiltrate consistent with acute pneumonia. The nodular density noted in the left lower lobe along the fissure on previous exam as likely related to underlying treatment. Tiny bilateral pleural effusions are noted. Stable small nodule along the major fissure on the right unchanged from prior exam. Lingular density seen previously are no longer present. Upper Abdomen: Within normal limits. Musculoskeletal: Degenerative change of the thoracic spine is noted. Review of the MIP images confirms the above findings. IMPRESSION: Resolution of previously seen nodular  densities within the lingula and left lower lobe but. The known left infrahilar mass lesion has resolved as well consistent with treatment. Stable small nodule along the major fissure on the right is seen. Bilateral ground-glass infiltrates consistent with atypical pneumonia. Some small lymph nodes are noted within the hila likely reactive in nature. Electronically Signed   By: Inez Catalina M.D.   On: 06/17/2016 12:13     Scheduled Meds: . allopurinol  100 mg Oral BID  . azithromycin  250 mg Oral Daily  .  multivitamin with minerals   Oral Daily  . piperacillin-tazobactam (ZOSYN)  IV  3.375 g Intravenous Q8H  . [START ON 06/18/2016] polyethylene glycol  17 g Oral Daily  . predniSONE  60 mg Oral Q breakfast  . senna  2 tablet Oral QHS  . sodium chloride flush  3 mL Intravenous Q12H  . vancomycin  750 mg Intravenous Q12H  . vitamin E  400 Units Oral Daily   Continuous Infusions: . sodium chloride 100 mL/hr at 06/17/16 0704     LOS: 1 day    Time spent: 30 min    Janece Canterbury, MD [facility] Pager [phone]  If 7PM-7AM, please contact night-coverage [email or web address] Password [email or web address] 06/17/2016, 1:21 PM

## 2016-06-18 DIAGNOSIS — J9601 Acute respiratory failure with hypoxia: Secondary | ICD-10-CM

## 2016-06-18 DIAGNOSIS — J189 Pneumonia, unspecified organism: Secondary | ICD-10-CM

## 2016-06-18 LAB — CBC WITH DIFFERENTIAL/PLATELET
BASOS PCT: 0 %
Basophils Absolute: 0 10*3/uL (ref 0.0–0.1)
EOS ABS: 0 10*3/uL (ref 0.0–0.7)
EOS PCT: 0 %
HCT: 29 % — ABNORMAL LOW (ref 39.0–52.0)
Hemoglobin: 9.9 g/dL — ABNORMAL LOW (ref 13.0–17.0)
LYMPHS ABS: 0.2 10*3/uL — AB (ref 0.7–4.0)
Lymphocytes Relative: 5 %
MCH: 32.2 pg (ref 26.0–34.0)
MCHC: 34.1 g/dL (ref 30.0–36.0)
MCV: 94.5 fL (ref 78.0–100.0)
Monocytes Absolute: 0.1 10*3/uL (ref 0.1–1.0)
Monocytes Relative: 3 %
Neutro Abs: 3.1 10*3/uL (ref 1.7–7.7)
Neutrophils Relative %: 92 %
PLATELETS: 80 10*3/uL — AB (ref 150–400)
RBC: 3.07 MIL/uL — AB (ref 4.22–5.81)
RDW: 15.6 % — ABNORMAL HIGH (ref 11.5–15.5)
WBC: 3.4 10*3/uL — AB (ref 4.0–10.5)

## 2016-06-18 LAB — BASIC METABOLIC PANEL
Anion gap: 5 (ref 5–15)
BUN: 14 mg/dL (ref 6–20)
CO2: 22 mmol/L (ref 22–32)
CREATININE: 0.95 mg/dL (ref 0.61–1.24)
Calcium: 8.5 mg/dL — ABNORMAL LOW (ref 8.9–10.3)
Chloride: 109 mmol/L (ref 101–111)
GFR calc Af Amer: >60 mL/min (ref >60)
Glucose, Bld: 195 mg/dL — ABNORMAL HIGH (ref 65–99)
Potassium: 3.9 mmol/L (ref 3.5–5.1)
SODIUM: 136 mmol/L (ref 135–145)

## 2016-06-18 LAB — STREP PNEUMONIAE URINARY ANTIGEN: STREP PNEUMO URINARY ANTIGEN: NEGATIVE

## 2016-06-18 MED ORDER — SODIUM CHLORIDE 0.9% FLUSH: 10.0000 mL | INTRAVENOUS | Status: DC | PRN

## 2016-06-18 MED ORDER — LEVOFLOXACIN IN D5W 750 MG/150ML IV SOLN
750.0000 mg | INTRAVENOUS | Status: DC
  Administered 2016-06-18 – 2016-06-19 (×2): 750 mg via INTRAVENOUS
  Filled 2016-06-18 (×2): qty 150

## 2016-06-18 NOTE — Progress Notes (Signed)
Pharmacy Antibiotic Note  Seth Gilbert is a 80 y.o. male with recently diagnosed stage II large B-cell NHL, last chemotherapy on 10/10, admitted on 06/16/2016 with febrile neutropenia.  Pharmacy was initially consulted for Vancomycin and Zosyn, now narrowed to Levaquin.    SCr 0.95, CrCl ~ 68 ml/min, ANC improved (0.1 > 3.1)  Plan:  Levaquin 750mg  IV q24h  Dosage remains stable and need for further dosage adjustment appears unlikely at present.  Pharmacy will sign off at this time.  Please reconsult if a change in clinical status warrants re-evaluation of dosage.   Height: 5\' 8"  (172.7 cm) Weight: 191 lb (86.6 kg) IBW/kg (Calculated) : 68.4  Temp (24hrs), Avg:98.7 F (37.1 C), Min:97.5 F (36.4 C), Max:99.5 F (37.5 C)   Recent Labs Lab 06/12/16 1128 06/12/16 1128 06/16/16 1059 06/16/16 1109 06/16/16 1735 06/17/16 0457 06/18/16 0319  WBC 0.4*  --  4.0  --   --  3.2* 3.4*  CREATININE  --  1.0 1.08  --   --  1.03 0.95  LATICACIDVEN  --   --   --  2.51* 3.3* 0.8  --     Estimated Creatinine Clearance: 68.6 mL/min (by C-G formula based on SCr of 0.95 mg/dL).    Allergies  Allergen Reactions  . No Known Allergies     Antimicrobials this admission: 10/21 Vancomycin >> 10/23 10/21 Zosyn >> 10/23 10/23 Levaquin >>  Dose adjustments this admission:  Microbiology results: 10/21BCx: ngtd 10/21UCx: < 10K, insignificant growth 10/21 respiratory panel: neg 10/21 MRSA PCR: neg 10/21 flu panel: neg 10/22 Strep pneumo Ur Ag: negative  Thank you for allowing pharmacy to be a part of this patient's care.  Gretta Arab PharmD, BCPS Pager [phone] 06/18/2016 9:05 AM

## 2016-06-18 NOTE — Progress Notes (Addendum)
PROGRESS NOTE  Seth Gilbert  [id number] DOB: [birth year] DOA: 06/16/2016 PCP: [name], MD  Brief Narrative:  Seth Gilbert is a 80 y.o. male with medical history significant of stage II large B cell non-Hodgkin's lymphoma diagnosed in 8/17 on chemotherapy, nephrolithiasis presented with a fever, chills, and shortness of breath.  CT angio chest was concerning for acute atypical pneumonia.  Continuing broad spectrum antibiotics pending blood cultures and adding atypical coverage with fluoroquinolone.  DDx includes drug reaction from [facility].    Assessment & Plan:   Principal Problem:   Fever Active Problems:   Diffuse large B-cell lymphoma of lymph nodes of multiple regions (HCC)   Lactic acidosis   Dehydration   Atypical pneumonia   Acute respiratory failure with hypoxia (HCC)  Sepsis and acute respiratory failure with hypoxia secondary to atypical pneumonia (fever, tachycardia, tachypnea, hypotension).  Blood pressure, wheezing and tachypneic have improved.  At risk for ARDS.  Lactic acidosis has resolved.   -  D/c IVF -  D/c vancomycin and zosyn -  Continue levofloxacin -  prednisone day 2 -  blood cultures NGTD -  Urine culture insig growth -  S. pneumo ag neg -  Legionella ag pending  Non-hodgkin's lymphoma -  Holding [facility] while being treated for infection  Pancytopenia without severe neutropenia due to chemotherapy - counts appear to be improving   Hard of hearing, worsening with additional chemotherapy  Constipation, resolving with miralax, senna, and bisacodyl  DVT prophylaxis:  SCD Code Status:  full Family Communication:  Patient alone, questions answered Disposition Plan:  Pending improvement in dyspnea.  PT eval.  Anticipate home soon   Consultants:   Dr. Alen Blew by phone on 10/22  Dr. Julien Nordmann added to rounding list  Procedures:  none  Antimicrobials:   Vancomycin and zosyn 10/21 > 10/23  Levofloxacin 10/22    Subjective: SOB and  frequent cough are improving.  He is feeling better today.  Sweating a lot since last night, but fevers have improved. Objective: Vitals:   06/18/16 0700 06/18/16 0800 06/18/16 0900 06/18/16 1029  BP: 103/64 112/70 106/63 109/63  Pulse: 74 81 73 85  Resp: (!) 23 (!) 29 (!) 30 20  Temp:  97.3 F (36.3 C)  97.3 F (36.3 C)  TempSrc:  Oral    SpO2: (!) 89% (!) 88% 92% 94%  Weight:      Height:        Intake/Output Summary (Last 24 hours) at 06/18/16 1303 Last data filed at 06/18/16 0920  Gross per 24 hour  Intake              640 ml  Output             1430 ml  Net             -790 ml   Filed Weights   06/16/16 1037  Weight: 86.6 kg (191 lb)    Examination:  General exam:  Adult male.  No acute distress.  HEENT:  NCAT, MMM Respiratory system:  Scattered rales throughout, end-exp wheeze, diminshed at bases, no rhonchi Cardiovascular system:  Regular rate and rhythm, normal S1/S2. No murmurs, rubs, gallops or clicks.  Warm extremities Gastrointestinal system: Normal active bowel sounds, soft, mildly distended, nontender. MSK:  Normal tone and bulk, no lower extremity edema Neuro:  Grossly moves all extremities    Data Reviewed: I have personally reviewed following labs and imaging studies  CBC:  Recent Labs Lab 06/12/16  1128 06/16/16 1059 06/17/16 0457 06/18/16 0319  WBC 0.4* 4.0 3.2* 3.4*  NEUTROABS 0.1* 3.5  --  3.1  HGB 10.9* 11.2* 9.2* 9.9*  HCT 31.9* 32.0* 26.6* 29.0*  MCV 94.7 94.1 95.3 94.5  PLT 39* 63* 65* 80*   Basic Metabolic Panel:  Recent Labs Lab 06/12/16 1128 06/16/16 1059 06/17/16 0457 06/18/16 0319  NA 134* 134* 137 136  K 4.2 4.0 3.7 3.9  CL  --  106 112* 109  CO2 23 20* 22 22  GLUCOSE 183* 167* 123* 195*  BUN 22.1 20 16 14   CREATININE 1.0 1.08 1.03 0.95  CALCIUM 8.4 8.5* 8.1* 8.5*   GFR: Estimated Creatinine Clearance: 68.6 mL/min (by C-G formula based on SCr of 0.95 mg/dL). Liver Function Tests:  Recent Labs Lab  06/12/16 1128 06/16/16 1059  AST 15 32  ALT 30 31  ALKPHOS 73 68  BILITOT 1.20 1.1  PROT 5.2* 5.9*  ALBUMIN 2.7* 3.2*   No results for input(s): LIPASE, AMYLASE in the last 168 hours. No results for input(s): AMMONIA in the last 168 hours. Coagulation Profile: No results for input(s): INR, PROTIME in the last 168 hours. Cardiac Enzymes: No results for input(s): CKTOTAL, CKMB, CKMBINDEX, TROPONINI in the last 168 hours. BNP (last 3 results) No results for input(s): PROBNP in the last 8760 hours. HbA1C: No results for input(s): HGBA1C in the last 72 hours. CBG: No results for input(s): GLUCAP in the last 168 hours. Lipid Profile: No results for input(s): CHOL, HDL, LDLCALC, TRIG, CHOLHDL, LDLDIRECT in the last 72 hours. Thyroid Function Tests: No results for input(s): TSH, T4TOTAL, FREET4, T3FREE, THYROIDAB in the last 72 hours. Anemia Panel: No results for input(s): VITAMINB12, FOLATE, FERRITIN, TIBC, IRON, RETICCTPCT in the last 72 hours. Urine analysis:    Component Value Date/Time   COLORURINE AMBER (A) 06/16/2016 1103   APPEARANCEUR CLEAR 06/16/2016 1103   LABSPEC 1.026 06/16/2016 1103   PHURINE 6.0 06/16/2016 1103   GLUCOSEU NEGATIVE 06/16/2016 1103   HGBUR NEGATIVE 06/16/2016 1103   BILIRUBINUR NEGATIVE 06/16/2016 1103   KETONESUR NEGATIVE 06/16/2016 1103   PROTEINUR NEGATIVE 06/16/2016 1103   NITRITE NEGATIVE 06/16/2016 1103   LEUKOCYTESUR NEGATIVE 06/16/2016 1103   Sepsis Labs: @LABRCNTIP (procalcitonin:4,lacticidven:4)  ) Recent Results (from the past 240 hour(s))  Blood Culture (routine x 2)     Status: None (Preliminary result)   Collection Time: 06/16/16 11:00 AM  Result Value Ref Range Status   Specimen Description BLOOD LEFT PORTA CATH  Final   Special Requests BOTTLES DRAWN AEROBIC AND ANAEROBIC 5CC  Final   Culture   Final    NO GROWTH < 24 HOURS Performed at [facility]    Report Status PENDING  Incomplete  Urine culture     Status:  Abnormal   Collection Time: 06/16/16 11:03 AM  Result Value Ref Range Status   Specimen Description URINE, RANDOM  Final   Special Requests NONE  Final   Culture (A)  Final    <10,000 COLONIES/mL INSIGNIFICANT GROWTH Performed at [facility]    Report Status 06/17/2016 FINAL  Final  Blood Culture (routine x 2)     Status: None (Preliminary result)   Collection Time: 06/16/16 11:20 AM  Result Value Ref Range Status   Specimen Description BLOOD LEFT HAND  Final   Special Requests BOTTLES DRAWN AEROBIC AND ANAEROBIC 5CC  Final   Culture   Final    NO GROWTH < 24 HOURS Performed at [facility], [place]  Report Status PENDING  Incomplete  MRSA PCR Screening     Status: None   Collection Time: 06/16/16  2:15 PM  Result Value Ref Range Status   MRSA by PCR NEGATIVE NEGATIVE Final    Comment:        The GeneXpert MRSA Assay (FDA approved for NASAL specimens only), is one component of a comprehensive MRSA colonization surveillance program. It is not intended to diagnose MRSA infection nor to guide or monitor treatment for MRSA infections.   Respiratory Panel by PCR     Status: None   Collection Time: 06/16/16  3:37 PM  Result Value Ref Range Status   Adenovirus NOT DETECTED NOT DETECTED Final   Coronavirus 229E NOT DETECTED NOT DETECTED Final   Coronavirus HKU1 NOT DETECTED NOT DETECTED Final   Coronavirus NL63 NOT DETECTED NOT DETECTED Final   Coronavirus OC43 NOT DETECTED NOT DETECTED Final   Metapneumovirus NOT DETECTED NOT DETECTED Final   Rhinovirus / Enterovirus NOT DETECTED NOT DETECTED Final   Influenza A NOT DETECTED NOT DETECTED Final   Influenza B NOT DETECTED NOT DETECTED Final   Parainfluenza Virus 1 NOT DETECTED NOT DETECTED Final   Parainfluenza Virus 2 NOT DETECTED NOT DETECTED Final   Parainfluenza Virus 3 NOT DETECTED NOT DETECTED Final   Parainfluenza Virus 4 NOT DETECTED NOT DETECTED Final   Respiratory Syncytial Virus NOT DETECTED NOT DETECTED  Final   Bordetella pertussis NOT DETECTED NOT DETECTED Final   Chlamydophila pneumoniae NOT DETECTED NOT DETECTED Final   Mycoplasma pneumoniae NOT DETECTED NOT DETECTED Final    Comment: Performed at [facility]      Radiology Studies: Ct Angio Chest Pe W Or Wo Contrast  Result Date: 06/17/2016 CLINICAL DATA:  Shortness of breath and fevers EXAM: CT ANGIOGRAPHY CHEST WITH CONTRAST TECHNIQUE: Multidetector CT imaging of the chest was performed using the standard protocol during bolus administration of intravenous contrast. Multiplanar CT image reconstructions and MIPs were obtained to evaluate the vascular anatomy. CONTRAST:  100 mL Isovue 370. COMPARISON:  03/12/2016 FINDINGS: Cardiovascular: Thoracic aorta is within normal limits without significant atherosclerotic calcification or aneurysmal dilatation. Coronary calcifications are seen. The pulmonary artery is well visualized and demonstrates a normal branching pattern. No filling defects to suggest pulmonary emboli are identified. Mediastinum/Nodes: The thoracic inlet is within normal limits. No significant mediastinal adenopathy is noted. Small bilateral hilar lymph nodes are seen. These are roughly similar to that noted on the prior exam but evaluation is somewhat difficult due to lack of IV contrast. Previously seen subcarinal adenopathy has resolved as has the left infrahilar mass lesion. No axillary adenopathy is noted. Lungs/Pleura: Lungs are well aerated bilaterally and demonstrate a patchy ground-glass infiltrate consistent with acute pneumonia. The nodular density noted in the left lower lobe along the fissure on previous exam as likely related to underlying treatment. Tiny bilateral pleural effusions are noted. Stable small nodule along the major fissure on the right unchanged from prior exam. Lingular density seen previously are no longer present. Upper Abdomen: Within normal limits. Musculoskeletal: Degenerative change of the  thoracic spine is noted. Review of the MIP images confirms the above findings. IMPRESSION: Resolution of previously seen nodular densities within the lingula and left lower lobe but. The known left infrahilar mass lesion has resolved as well consistent with treatment. Stable small nodule along the major fissure on the right is seen. Bilateral ground-glass infiltrates consistent with atypical pneumonia. Some small lymph nodes are noted within the hila likely reactive  in nature. Electronically Signed   By: Inez Catalina M.D.   On: 06/17/2016 12:13     Scheduled Meds: . allopurinol  100 mg Oral BID  . levofloxacin (LEVAQUIN) IV  750 mg Intravenous Q24H  . multivitamin with minerals   Oral Daily  . polyethylene glycol  17 g Oral Daily  . predniSONE  60 mg Oral Q breakfast  . senna  2 tablet Oral QHS  . sodium chloride flush  3 mL Intravenous Q12H  . vitamin E  400 Units Oral Daily   Continuous Infusions:     LOS: 2 days    Time spent: 30 min    Janece Canterbury, MD [facility] Pager [phone]  If 7PM-7AM, please contact night-coverage [email or web address] Password [email or web address] 06/18/2016, 1:03 PM

## 2016-06-19 ENCOUNTER — Other Ambulatory Visit: Payer: Medicare Other

## 2016-06-19 DIAGNOSIS — J9601 Acute respiratory failure with hypoxia: Secondary | ICD-10-CM

## 2016-06-19 LAB — LEGIONELLA PNEUMOPHILA SEROGP 1 UR AG: L. pneumophila Serogp 1 Ur Ag: NEGATIVE

## 2016-06-19 MED ORDER — HEPARIN SOD (PORK) LOCK FLUSH 100 UNIT/ML IV SOLN
500.0000 [IU] | INTRAVENOUS | Status: AC | PRN
  Administered 2016-06-19: 500 [IU]
  Filled 2016-06-19: qty 5

## 2016-06-19 MED ORDER — PREDNISONE 20 MG PO TABS: ORAL_TABLET | ORAL | 0 refills | Status: DC

## 2016-06-19 MED ORDER — LEVOFLOXACIN 750 MG PO TABS: 750.0000 mg | ORAL_TABLET | Freq: Every day | ORAL | 0 refills | Status: DC

## 2016-06-19 NOTE — Progress Notes (Signed)
Patient d/c home. Stable. 

## 2016-06-19 NOTE — Discharge Summary (Signed)
Physician Discharge Summary  Seth Gilbert [id number] DOB: [birth year] DOA: 06/16/2016  PCP: [name], MD  Admit date: 06/16/2016 Discharge date: 06/19/2016  Admitted From: home  Disposition:  home  Recommendations for Outpatient Follow-up:  1. Follow up with PCP in 1-2 weeks 2. Please obtain BMP/CBC in one week 3. Please follow up on the following pending results:  Home Health:  none  Equipment/Devices:  none  Discharge Condition:  Stable, improved CODE STATUS:  full  Diet recommendation:  regular   Brief/Interim Summary:  Seth Gilbert a 80 y.o.malewith medical history significant of stage II large B cell non-Hodgkin's lymphoma diagnosed in 8/17 on chemotherapy, nephrolithiasis presented with a fever, chills, and shortness of breath.  CT angio chest was concerning for acute atypical pneumonia.  Continuing broad spectrum antibiotics pending blood cultures and adding atypical coverage with fluoroquinolone.  DDx includes drug reaction from [facility].    Discharge Diagnoses:  Principal Problem:   Atypical pneumonia Active Problems:   Diffuse large B-cell lymphoma of lymph nodes of multiple regions (HCC)   Fever   Lactic acidosis   Dehydration   Acute respiratory failure with hypoxia (HCC)  Sepsis and acute respiratory failure with hypoxia secondary to atypical pneumonia (fever, tachycardia, tachypnea, hypotension).  Blood pressure, wheezing and tachypneic have improved.  At risk for ARDS.  Lactic acidosis has resolved.   -  Continue levofloxacin to complete a 7day course -  prednisone taper -  blood cultures NGTD -  Urine culture insig growth -  S. pneumo ag neg  Non-hodgkin's lymphoma -  resume [facility] next week  Pancytopenia without severe neutropenia due to chemotherapy - counts appear to be improving   Hard of hearing, worsening with additional chemotherapy  Constipation, resolving with miralax, senna, and bisacodyl   Discharge  Instructions  Discharge Instructions    Call MD for:  difficulty breathing, headache or visual disturbances    Complete by:  As directed    Call MD for:  extreme fatigue    Complete by:  As directed    Call MD for:  hives    Complete by:  As directed    Call MD for:  persistant dizziness or light-headedness    Complete by:  As directed    Call MD for:  persistant nausea and vomiting    Complete by:  As directed    Call MD for:  severe uncontrolled pain    Complete by:  As directed    Call MD for:  temperature >100.4    Complete by:  As directed    Diet - low sodium heart healthy    Complete by:  As directed    Increase activity slowly    Complete by:  As directed        Medication List    STOP taking these medications   ampicillin 500 MG capsule Commonly known as:  PRINCIPEN     TAKE these medications   acetaminophen 500 MG tablet Commonly known as:  TYLENOL Take 1,000 mg by mouth every 6 (six) hours as needed for moderate pain.   allopurinol 100 MG tablet Commonly known as:  ZYLOPRIM Take 1 tablet (100 mg total) by mouth 2 (two) times daily.   CENTRUM SILVER PO Take 1 tablet by mouth daily.   famotidine 40 MG tablet Commonly known as:  PEPCID Take 40 mg by mouth daily as needed for heartburn.   HYDROcodone-acetaminophen 5-325 MG tablet Commonly known as:  NORCO/VICODIN Take 1 tablet by  mouth every 6 (six) hours as needed for moderate pain.   levofloxacin 750 MG tablet Commonly known as:  LEVAQUIN Take 1 tablet (750 mg total) by mouth daily.   lidocaine-prilocaine cream Commonly known as:  EMLA Apply 1 application topically as needed.   metroNIDAZOLE 0.75 % gel Commonly known as:  METROGEL Apply 1 application topically daily.   predniSONE 50 MG tablet Commonly known as:  DELTASONE 2 tablets by mouth daily for 5 days every chemotherapy cycle starting with the first day of the chemotherapy. What changed:  Another medication with the same name was added.  Make sure you understand how and when to take each.   predniSONE 20 MG tablet Commonly known as:  DELTASONE Take 3 tabs daily for 2 days, then 2 tabs daily for 2 days, then 1 tab daily for 2 days What changed:  You were already taking a medication with the same name, and this prescription was added. Make sure you understand how and when to take each.   prochlorperazine 10 MG tablet Commonly known as:  COMPAZINE Take 1 tablet (10 mg total) by mouth every 6 (six) hours as needed for nausea or vomiting.   senna 8.6 MG tablet Commonly known as:  SENOKOT Take 1 tablet by mouth daily as needed for constipation.   vitamin E 400 UNIT capsule Take 400 Units by mouth daily.      Follow-up Information    [name], MD .   Specialty:  [facility] Contact information: [place] [address] [phone]        Eilleen Kempf., MD Follow up on 06/26/2016.   Specialty:  Oncology Why:  already scheduled appointment Contact information: [address] [place] [address] [phone]          Allergies  Allergen Reactions  . No Known Allergies     Consultations: none  Procedures/Studies: Dg Chest 2 View  Result Date: 06/16/2016 CLINICAL DATA:  Fever. Ongoing chemotherapy for non-Hodgkin's lymphoma. EXAM: CHEST  2 VIEW COMPARISON:  Chest radiograph 03/08/2016 FINDINGS: Left subclavian Port-A-Cath tip in the lower SVC region. Mild fullness at the left lung base is unchanged and probably related to cardiophrenic fat. Patient has known nodular densities in this area but not clearly appreciated on these radiographs. There is no focal airspace disease or pulmonary edema. Heart and mediastinum are within normal limits. The trachea is midline. Multilevel degenerative changes in the thoracic spine. No large pleural effusions. IMPRESSION: No acute chest abnormality. Electronically Signed   By: Markus Daft M.D.   On: 06/16/2016 11:15   Ct Angio Chest Pe W Or  Wo Contrast  Result Date: 06/17/2016 CLINICAL DATA:  Shortness of breath and fevers EXAM: CT ANGIOGRAPHY CHEST WITH CONTRAST TECHNIQUE: Multidetector CT imaging of the chest was performed using the standard protocol during bolus administration of intravenous contrast. Multiplanar CT image reconstructions and MIPs were obtained to evaluate the vascular anatomy. CONTRAST:  100 mL Isovue 370. COMPARISON:  03/12/2016 FINDINGS: Cardiovascular: Thoracic aorta is within normal limits without significant atherosclerotic calcification or aneurysmal dilatation. Coronary calcifications are seen. The pulmonary artery is well visualized and demonstrates a normal branching pattern. No filling defects to suggest pulmonary emboli are identified. Mediastinum/Nodes: The thoracic inlet is within normal limits. No significant mediastinal adenopathy is noted. Small bilateral hilar lymph nodes are seen. These are roughly similar to that noted on the prior exam but evaluation is somewhat difficult due to lack of IV contrast. Previously seen subcarinal adenopathy  has resolved as has the left infrahilar mass lesion. No axillary adenopathy is noted. Lungs/Pleura: Lungs are well aerated bilaterally and demonstrate a patchy ground-glass infiltrate consistent with acute pneumonia. The nodular density noted in the left lower lobe along the fissure on previous exam as likely related to underlying treatment. Tiny bilateral pleural effusions are noted. Stable small nodule along the major fissure on the right unchanged from prior exam. Lingular density seen previously are no longer present. Upper Abdomen: Within normal limits. Musculoskeletal: Degenerative change of the thoracic spine is noted. Review of the MIP images confirms the above findings. IMPRESSION: Resolution of previously seen nodular densities within the lingula and left lower lobe but. The known left infrahilar mass lesion has resolved as well consistent with treatment. Stable small  nodule along the major fissure on the right is seen. Bilateral ground-glass infiltrates consistent with atypical pneumonia. Some small lymph nodes are noted within the hila likely reactive in nature. Electronically Signed   By: Inez Catalina M.D.   On: 06/17/2016 12:13    Subjective: Feeling better. Cough mostly resolved in last 24 hours.  Able to walk 2.5 times around the hall without hypoxia.    Discharge Exam:  Vitals:   06/18/16 1029 06/18/16 1900 06/18/16 2123 06/19/16 0549  BP: 109/63 131/64 119/72 123/70  Pulse: 85 98 89 93  Resp: 20 18 18 18   Temp: 97.3 F (36.3 C) 98 F (36.7 C) 97.8 F (36.6 C) 97.6 F (36.4 C)  TempSrc:  Oral Oral Oral  SpO2: 94% 94% 96% 92%  Weight:      Height:       General exam:  Adult male.  No acute distress.  HEENT:  NCAT, MMM Respiratory system:  rare rales throughout, diminshed at bases, no rhonchi, no wheezes Cardiovascular system:  Regular rate and rhythm, normal S1/S2. No murmurs, rubs, gallops or clicks.  Warm extremities Gastrointestinal system: Normal active bowel sounds, soft, mildly distended, nontender. MSK:  Normal tone and bulk, no lower extremity edema Neuro:  Grossly moves all extremities    The results of significant diagnostics from this hospitalization (including imaging, microbiology, ancillary and laboratory) are listed below for reference.     Microbiology: Recent Results (from the past 240 hour(s))  Blood Culture (routine x 2)     Status: None (Preliminary result)   Collection Time: 06/16/16 11:00 AM  Result Value Ref Range Status   Specimen Description BLOOD LEFT PORTA CATH  Final   Special Requests BOTTLES DRAWN AEROBIC AND ANAEROBIC 5CC  Final   Culture   Final    NO GROWTH 2 DAYS Performed at [facility]    Report Status PENDING  Incomplete  Urine culture     Status: Abnormal   Collection Time: 06/16/16 11:03 AM  Result Value Ref Range Status   Specimen Description URINE, RANDOM  Final   Special  Requests NONE  Final   Culture (A)  Final    <10,000 COLONIES/mL INSIGNIFICANT GROWTH Performed at [facility]    Report Status 06/17/2016 FINAL  Final  Blood Culture (routine x 2)     Status: None (Preliminary result)   Collection Time: 06/16/16 11:20 AM  Result Value Ref Range Status   Specimen Description BLOOD LEFT HAND  Final   Special Requests BOTTLES DRAWN AEROBIC AND ANAEROBIC 5CC  Final   Culture   Final    NO GROWTH 2 DAYS Performed at [facility]    Report Status PENDING  Incomplete  MRSA PCR Screening     Status: None   Collection Time: 06/16/16  2:15 PM  Result Value Ref Range Status   MRSA by PCR NEGATIVE NEGATIVE Final    Comment:        The GeneXpert MRSA Assay (FDA approved for NASAL specimens only), is one component of a comprehensive MRSA colonization surveillance program. It is not intended to diagnose MRSA infection nor to guide or monitor treatment for MRSA infections.   Respiratory Panel by PCR     Status: None   Collection Time: 06/16/16  3:37 PM  Result Value Ref Range Status   Adenovirus NOT DETECTED NOT DETECTED Final   Coronavirus 229E NOT DETECTED NOT DETECTED Final   Coronavirus HKU1 NOT DETECTED NOT DETECTED Final   Coronavirus NL63 NOT DETECTED NOT DETECTED Final   Coronavirus OC43 NOT DETECTED NOT DETECTED Final   Metapneumovirus NOT DETECTED NOT DETECTED Final   Rhinovirus / Enterovirus NOT DETECTED NOT DETECTED Final   Influenza A NOT DETECTED NOT DETECTED Final   Influenza B NOT DETECTED NOT DETECTED Final   Parainfluenza Virus 1 NOT DETECTED NOT DETECTED Final   Parainfluenza Virus 2 NOT DETECTED NOT DETECTED Final   Parainfluenza Virus 3 NOT DETECTED NOT DETECTED Final   Parainfluenza Virus 4 NOT DETECTED NOT DETECTED Final   Respiratory Syncytial Virus NOT DETECTED NOT DETECTED Final   Bordetella pertussis NOT DETECTED NOT DETECTED Final   Chlamydophila pneumoniae NOT DETECTED NOT DETECTED Final   Mycoplasma  pneumoniae NOT DETECTED NOT DETECTED Final    Comment: Performed at [place]: BNP (last 3 results) No results for input(s): BNP in the last 8760 hours. Basic Metabolic Panel:  Recent Labs Lab 06/12/16 1128 06/16/16 1059 06/17/16 0457 06/18/16 0319  NA 134* 134* 137 136  K 4.2 4.0 3.7 3.9  CL  --  106 112* 109  CO2 23 20* 22 22  GLUCOSE 183* 167* 123* 195*  BUN 22.1 20 16 14   CREATININE 1.0 1.08 1.03 0.95  CALCIUM 8.4 8.5* 8.1* 8.5*   Liver Function Tests:  Recent Labs Lab 06/12/16 1128 06/16/16 1059  AST 15 32  ALT 30 31  ALKPHOS 73 68  BILITOT 1.20 1.1  PROT 5.2* 5.9*  ALBUMIN 2.7* 3.2*   No results for input(s): LIPASE, AMYLASE in the last 168 hours. No results for input(s): AMMONIA in the last 168 hours. CBC:  Recent Labs Lab 06/12/16 1128 06/16/16 1059 06/17/16 0457 06/18/16 0319  WBC 0.4* 4.0 3.2* 3.4*  NEUTROABS 0.1* 3.5  --  3.1  HGB 10.9* 11.2* 9.2* 9.9*  HCT 31.9* 32.0* 26.6* 29.0*  MCV 94.7 94.1 95.3 94.5  PLT 39* 63* 65* 80*   Cardiac Enzymes: No results for input(s): CKTOTAL, CKMB, CKMBINDEX, TROPONINI in the last 168 hours. BNP: Invalid input(s): POCBNP CBG: No results for input(s): GLUCAP in the last 168 hours. D-Dimer No results for input(s): DDIMER in the last 72 hours. Hgb A1c No results for input(s): HGBA1C in the last 72 hours. Lipid Profile No results for input(s): CHOL, HDL, LDLCALC, TRIG, CHOLHDL, LDLDIRECT in the last 72 hours. Thyroid function studies No results for input(s): TSH, T4TOTAL, T3FREE, THYROIDAB in the last 72 hours.  Invalid input(s): FREET3 Anemia work up No results for input(s): VITAMINB12, FOLATE, FERRITIN, TIBC, IRON, RETICCTPCT in the last 72 hours. Urinalysis    Component Value Date/Time   COLORURINE AMBER (A) 06/16/2016 1103   APPEARANCEUR CLEAR 06/16/2016 1103   LABSPEC  1.026 06/16/2016 1103   PHURINE 6.0 06/16/2016 1103   GLUCOSEU NEGATIVE 06/16/2016 1103   HGBUR NEGATIVE  06/16/2016 1103   BILIRUBINUR NEGATIVE 06/16/2016 1103   KETONESUR NEGATIVE 06/16/2016 1103   PROTEINUR NEGATIVE 06/16/2016 1103   NITRITE NEGATIVE 06/16/2016 1103   LEUKOCYTESUR NEGATIVE 06/16/2016 1103   Sepsis Labs Invalid input(s): PROCALCITONIN,  WBC,  LACTICIDVEN   Time coordinating discharge: Over 30 minutes  SIGNED:   Janece Canterbury, MD  [facility] 06/19/2016, 8:16 AM Pager   If 7PM-7AM, please contact night-coverage [email or web address] Password [email or web address]

## 2016-06-19 NOTE — Progress Notes (Signed)
Patient was given d/c instructions on his meds,appointments, care notes on PNA, patient verbalized understanding. No questions asked. Patient is stable. No c/o pain.

## 2016-06-21 LAB — CULTURE, BLOOD (ROUTINE X 2)
CULTURE: NO GROWTH
CULTURE: NO GROWTH

## 2016-06-22 ENCOUNTER — Ambulatory Visit (HOSPITAL_COMMUNITY)
Admission: RE | Admit: 2016-06-22 | Discharge: 2016-06-22 | Disposition: A | Discharge status: Home / Self Care | Payer: Medicare Other | Source: Ambulatory Visit | Attending: Internal Medicine | Admitting: Internal Medicine

## 2016-06-22 ENCOUNTER — Encounter (HOSPITAL_COMMUNITY): Payer: Self-pay

## 2016-06-22 DIAGNOSIS — K5790 Diverticulosis of intestine, part unspecified, without perforation or abscess without bleeding: Secondary | ICD-10-CM | POA: Insufficient documentation

## 2016-06-22 DIAGNOSIS — I700 Atherosclerosis of aorta: Secondary | ICD-10-CM | POA: Insufficient documentation

## 2016-06-22 DIAGNOSIS — K449 Diaphragmatic hernia without obstruction or gangrene: Secondary | ICD-10-CM | POA: Insufficient documentation

## 2016-06-22 DIAGNOSIS — R918 Other nonspecific abnormal finding of lung field: Secondary | ICD-10-CM | POA: Diagnosis not present

## 2016-06-22 DIAGNOSIS — C8338 Diffuse large B-cell lymphoma, lymph nodes of multiple sites: Secondary | ICD-10-CM | POA: Insufficient documentation

## 2016-06-22 DIAGNOSIS — C8590 Non-Hodgkin lymphoma, unspecified, unspecified site: Secondary | ICD-10-CM | POA: Diagnosis not present

## 2016-06-22 DIAGNOSIS — I2510 Atherosclerotic heart disease of native coronary artery without angina pectoris: Secondary | ICD-10-CM | POA: Diagnosis not present

## 2016-06-22 DIAGNOSIS — Z5111 Encounter for antineoplastic chemotherapy: Secondary | ICD-10-CM

## 2016-06-22 DIAGNOSIS — N200 Calculus of kidney: Secondary | ICD-10-CM | POA: Diagnosis not present

## 2016-06-22 MED ORDER — IOPAMIDOL (ISOVUE-300) INJECTION 61%
100.0000 mL | Freq: Once | INTRAVENOUS | Status: AC | PRN
  Administered 2016-06-22: 100 mL via INTRAVENOUS

## 2016-06-25 DIAGNOSIS — D709 Neutropenia, unspecified: Secondary | ICD-10-CM | POA: Diagnosis not present

## 2016-06-25 DIAGNOSIS — J189 Pneumonia, unspecified organism: Secondary | ICD-10-CM | POA: Diagnosis not present

## 2016-06-26 ENCOUNTER — Encounter: Payer: Self-pay | Admitting: Internal Medicine

## 2016-06-26 ENCOUNTER — Ambulatory Visit: Payer: Medicare Other

## 2016-06-26 ENCOUNTER — Ambulatory Visit (HOSPITAL_BASED_OUTPATIENT_CLINIC_OR_DEPARTMENT_OTHER): Payer: Medicare Other | Admitting: Internal Medicine

## 2016-06-26 ENCOUNTER — Other Ambulatory Visit (HOSPITAL_BASED_OUTPATIENT_CLINIC_OR_DEPARTMENT_OTHER): Payer: Medicare Other

## 2016-06-26 ENCOUNTER — Telehealth: Payer: Self-pay | Admitting: Internal Medicine

## 2016-06-26 VITALS — BP 109/58 | HR 102 | Temp 97.8°F | Resp 16 | Ht 68.0 in | Wt 185.7 lb

## 2016-06-26 DIAGNOSIS — R5383 Other fatigue: Secondary | ICD-10-CM

## 2016-06-26 DIAGNOSIS — C8338 Diffuse large B-cell lymphoma, lymph nodes of multiple sites: Secondary | ICD-10-CM

## 2016-06-26 DIAGNOSIS — Z5111 Encounter for antineoplastic chemotherapy: Secondary | ICD-10-CM

## 2016-06-26 LAB — COMPREHENSIVE METABOLIC PANEL
ALBUMIN: 2.8 g/dL — AB (ref 3.5–5.0)
ALK PHOS: 69 U/L (ref 40–150)
ALT: 31 U/L (ref 0–55)
AST: 22 U/L (ref 5–34)
Anion Gap: 9 mEq/L (ref 3–11)
BUN: 18.7 mg/dL (ref 7.0–26.0)
CALCIUM: 8.9 mg/dL (ref 8.4–10.4)
CO2: 28 mEq/L (ref 22–29)
CREATININE: 1.1 mg/dL (ref 0.7–1.3)
Chloride: 105 mEq/L (ref 98–109)
EGFR: 65 mL/min/{1.73_m2} — ABNORMAL LOW (ref >90)
GLUCOSE: 142 mg/dL — AB (ref 70–140)
Potassium: 3.7 mEq/L (ref 3.5–5.1)
Sodium: 141 mEq/L (ref 136–145)
Total Bilirubin: 0.53 mg/dL (ref 0.20–1.20)
Total Protein: 5.5 g/dL — ABNORMAL LOW (ref 6.4–8.3)

## 2016-06-26 LAB — CBC WITH DIFFERENTIAL/PLATELET
BASO%: 0.1 % (ref 0.0–2.0)
Basophils Absolute: 0 10*3/uL (ref 0.0–0.1)
EOS%: 0.8 % (ref 0.0–7.0)
Eosinophils Absolute: 0.1 10*3/uL (ref 0.0–0.5)
HEMATOCRIT: 35.3 % — AB (ref 38.4–49.9)
HEMOGLOBIN: 11.7 g/dL — AB (ref 13.0–17.1)
LYMPH#: 1.1 10*3/uL (ref 0.9–3.3)
LYMPH%: 12.4 % — ABNORMAL LOW (ref 14.0–49.0)
MCH: 32.4 pg (ref 27.2–33.4)
MCHC: 33.1 g/dL (ref 32.0–36.0)
MCV: 97.8 fL (ref 79.3–98.0)
MONO#: 1.6 10*3/uL — ABNORMAL HIGH (ref 0.1–0.9)
MONO%: 18.6 % — ABNORMAL HIGH (ref 0.0–14.0)
NEUT%: 68.1 % (ref 39.0–75.0)
NEUTROS ABS: 5.9 10*3/uL (ref 1.5–6.5)
Platelets: 174 10*3/uL (ref 140–400)
RBC: 3.61 10*6/uL — ABNORMAL LOW (ref 4.20–5.82)
RDW: 17.3 % — AB (ref 11.0–14.6)
WBC: 8.6 10*3/uL (ref 4.0–10.3)

## 2016-06-26 LAB — URIC ACID: Uric Acid, Serum: 4.2 mg/dl (ref 2.6–7.4)

## 2016-06-26 LAB — LACTATE DEHYDROGENASE: LDH: 264 U/L — ABNORMAL HIGH (ref 125–245)

## 2016-06-26 NOTE — Telephone Encounter (Signed)
Previous Infusions, labs and follow up appointments rescheduled per Dr. Julien Nordmann, per 06/26/16 los. Tx plan updated and scheduled per 06/26/16 los. AVS report and appointment schedule given to patient per 06/26/16 los.

## 2016-06-26 NOTE — Progress Notes (Signed)
[facility] [phone]   Fax:([phone]) [place], MD [place] [address]  DIAGNOSIS: Stage II large B-cell non-Hodgkin lymphoma diagnosed in August 2017.   PRIOR THERAPY: None.  CURRENT THERAPY: Systemic chemotherapy with CHOP/Rituxan every 3 weeks with Neulasta support. First dose 04/24/2016. Status post 3 cycles  INTERVAL HISTORY: Seth Gilbert 80 y.o. male returns to the clinic today for follow-up visit accompanied by his wife. The patient is feeling fine today with no specific complaints except for fatigue. He tolerated the third cycle of systemic chemotherapy with CHOP/Rituxan fairly well except for pancytopenia and he was admitted to [facility] for treatment of pneumonia. He has no significant weight loss or night sweats. He denied having any significant chest pain, shortness of breath, cough or hemoptysis. He denied having any nausea, vomiting, diarrhea or constipation. He has no fever or chills. He had repeat CT scan of the chest, abdomen and pelvis performed recently and he is here for evaluation and discussion of his scan results.  MEDICAL HISTORY: Past Medical History:  Diagnosis Date  . Cancer ([place])    NHL  . Diverticulitis   . GERD (gastroesophageal reflux disease)    Heartburn at times  . Heart murmur    "comes and goes" onset 4th grade - last time anyone heard it was 01/2014  . History of hiatal hernia   . History of kidney stones   . HOH (hard of hearing)   . PONV (postoperative nausea and vomiting)   . Rosacea   . Wears glasses     ALLERGIES:  is allergic to no known allergies.  MEDICATIONS:  Current Outpatient Prescriptions  Medication Sig Dispense Refill  . acetaminophen (TYLENOL) 500 MG tablet Take 1,000 mg by mouth every 6 (six) hours as needed for moderate pain.    Marland Kitchen allopurinol (ZYLOPRIM) 100 MG tablet Take 1 tablet (100 mg total) by mouth 2 (two) times daily.  60 tablet 3  . famotidine (PEPCID) 40 MG tablet Take 40 mg by mouth daily as needed for heartburn.     Marland Kitchen HYDROcodone-acetaminophen (NORCO/VICODIN) 5-325 MG tablet Take 1 tablet by mouth every 6 (six) hours as needed for moderate pain. 15 tablet 0  . levofloxacin (LEVAQUIN) 750 MG tablet Take 1 tablet (750 mg total) by mouth daily. 3 tablet 0  . lidocaine-prilocaine (EMLA) cream Apply 1 application topically as needed. 30 g 0  . metroNIDAZOLE (METROGEL) 0.75 % gel Apply 1 application topically daily.     . Multiple Vitamins-Minerals (CENTRUM SILVER PO) Take 1 tablet by mouth daily.     . predniSONE (DELTASONE) 20 MG tablet Take 3 tabs daily for 2 days, then 2 tabs daily for 2 days, then 1 tab daily for 2 days 12 tablet 0  . predniSONE (DELTASONE) 50 MG tablet 2 tablets by mouth daily for 5 days every chemotherapy cycle starting with the first day of the chemotherapy. 60 tablet 0  . prochlorperazine (COMPAZINE) 10 MG tablet Take 1 tablet (10 mg total) by mouth every 6 (six) hours as needed for nausea or vomiting. 30 tablet 0  . senna (SENOKOT) 8.6 MG tablet Take 1 tablet by mouth daily as needed for constipation.    . vitamin E 400 UNIT capsule Take 400 Units by mouth daily.     No current facility-administered medications for this visit.     SURGICAL HISTORY:  Past Surgical History:  Procedure  Laterality Date  . APPENDECTOMY    . COLONOSCOPY    . HEMORROIDECTOMY     inflammed anal gland removed  . INGUINAL HERNIA REPAIR Left 02/11/2014   Procedure: LEFT INGUINAL HERNIA REPAIR WITH MESH ;  Surgeon: Joyice Faster. Cornett, MD;  Location: [facility];  Service: General;  Laterality: Left;  . INSERTION OF MESH N/A 02/11/2014   Procedure: INSERTION OF MESH;  Surgeon: Joyice Faster. Cornett, MD;  Location: [facility];  Service: General;  Laterality: N/A;  . PORTACATH PLACEMENT Left 04/10/2016   Procedure: INSERTION PORT-A-CATH into LEFT Subclavian Vein with guidance from  ultrasound and flouroscopy;  Surgeon: Grace Isaac, MD;  Location: [facility];  Service: Thoracic;  Laterality: Left;  . TONSILLECTOMY    . VIDEO BRONCHOSCOPY WITH ENDOBRONCHIAL ULTRASOUND N/A 03/30/2016   Procedure: VIDEO BRONCHOSCOPY WITH ENDOBRONCHIAL ULTRASOUND,WITH TRANSBRONCHIAL BIOPSY;  Surgeon: Grace Isaac, MD;  Location: [facility];  Service: Thoracic;  Laterality: N/A;    REVIEW OF SYSTEMS:  Constitutional: positive for fatigue Eyes: negative Ears, nose, mouth, throat, and face: negative Respiratory: negative Cardiovascular: negative Gastrointestinal: negative Genitourinary:negative Integument/breast: negative Hematologic/lymphatic: negative Musculoskeletal:positive for muscle weakness Neurological: negative Behavioral/Psych: negative Endocrine: negative Allergic/Immunologic: negative   PHYSICAL EXAMINATION: General appearance: alert, cooperative and no distress Head: Normocephalic, without obvious abnormality, atraumatic Neck: no adenopathy, no JVD, supple, symmetrical, trachea midline and thyroid not enlarged, symmetric, no tenderness/mass/nodules Lymph nodes: Cervical, supraclavicular, and axillary nodes normal. Resp: clear to auscultation bilaterally Back: symmetric, no curvature. ROM normal. No CVA tenderness. Cardio: regular rate and rhythm, S1, S2 normal, no murmur, click, rub or gallop GI: soft, non-tender; bowel sounds normal; no masses,  no organomegaly Extremities: extremities normal, atraumatic, no cyanosis or edema Neurologic: Alert and oriented X 3, normal strength and tone. Normal symmetric reflexes. Normal coordination and gait  ECOG PERFORMANCE STATUS: 1 - Symptomatic but completely ambulatory  Blood pressure (!) 109/58, pulse (!) 102, temperature 97.8 F (36.6 C), temperature source Oral, resp. rate 16, height 5\' 8"  (1.727 m), weight 185 lb 11.2 oz (84.2 kg), SpO2 100 %.  LABORATORY DATA: Lab Results  Component Value Date   WBC 8.6 06/26/2016   HGB  11.7 (L) 06/26/2016   HCT 35.3 (L) 06/26/2016   MCV 97.8 06/26/2016   PLT 174 06/26/2016      Chemistry      Component Value Date/Time   NA 136 06/18/2016 0319   NA 134 (L) 06/12/2016 1128   K 3.9 06/18/2016 0319   K 4.2 06/12/2016 1128   CL 109 06/18/2016 0319   CO2 22 06/18/2016 0319   CO2 23 06/12/2016 1128   BUN 14 06/18/2016 0319   BUN 22.1 06/12/2016 1128   CREATININE 0.95 06/18/2016 0319   CREATININE 1.0 06/12/2016 1128      Component Value Date/Time   CALCIUM 8.5 (L) 06/18/2016 0319   CALCIUM 8.4 06/12/2016 1128   ALKPHOS 68 06/16/2016 1059   ALKPHOS 73 06/12/2016 1128   AST 32 06/16/2016 1059   AST 15 06/12/2016 1128   ALT 31 06/16/2016 1059   ALT 30 06/12/2016 1128   BILITOT 1.1 06/16/2016 1059   BILITOT 1.20 06/12/2016 1128       RADIOGRAPHIC STUDIES: Dg Chest 2 View  Result Date: 06/16/2016 CLINICAL DATA:  Fever. Ongoing chemotherapy for non-Hodgkin's lymphoma. EXAM: CHEST  2 VIEW COMPARISON:  Chest radiograph 03/08/2016 FINDINGS: Left subclavian Port-A-Cath tip in the lower SVC region. Mild fullness at the left lung base is unchanged  and probably related to cardiophrenic fat. Patient has known nodular densities in this area but not clearly appreciated on these radiographs. There is no focal airspace disease or pulmonary edema. Heart and mediastinum are within normal limits. The trachea is midline. Multilevel degenerative changes in the thoracic spine. No large pleural effusions. IMPRESSION: No acute chest abnormality. Electronically Signed   By: Markus Daft M.D.   On: 06/16/2016 11:15   Ct Chest W Contrast  Result Date: 06/23/2016 CLINICAL DATA:  Re- stage diffuse large B-cell lymphoma diagnosed August 2017 status post chemotherapy. EXAM: CT CHEST, ABDOMEN, AND PELVIS WITH CONTRAST TECHNIQUE: Multidetector CT imaging of the chest, abdomen and pelvis was performed following the standard protocol during bolus administration of intravenous contrast. CONTRAST:   165mL ISOVUE-300 IOPAMIDOL (ISOVUE-300) INJECTION 61% COMPARISON:  06/17/2016 chest CT angiogram.  03/20/2016 PET-CT. FINDINGS: CT CHEST FINDINGS Cardiovascular: Normal heart size. Stable trace pericardial effusion/ thickening. Left subclavian MediPort terminates at the cavoatrial junction. Left main, left anterior descending, left circumflex and right coronary atherosclerosis. Atherosclerotic nonaneurysmal thoracic aorta. Normal caliber pulmonary arteries. No central pulmonary emboli. Mediastinum/Nodes: No discrete thyroid nodules. Unremarkable esophagus. No axillary adenopathy. No pathologically enlarged mediastinal or hilar nodes. Lungs/Pleura: No pneumothorax. No pleural effusion. Mild perifissural nodularity along both major fissures measuring up to 6 mm on the right (series 4/ image 73) is stable since 03/20/2016 and probably benign. The additional previously described pulmonary nodules on the 03/20/2016 PET-CT study have resolved. No acute consolidative airspace disease or new significant pulmonary nodules. Extensive patchy predominantly peribronchovascular ground-glass attenuation and reticulation throughout both lungs, predominantly in the upper lungs, is new since 03/20/2016 and decreased since 06/17/2016. Musculoskeletal: No aggressive appearing focal osseous lesions. Moderate thoracic spondylosis. CT ABDOMEN PELVIS FINDINGS Hepatobiliary: Normal liver with no liver mass. Normal gallbladder with no radiopaque cholelithiasis. No biliary ductal dilatation. Pancreas: Normal, with no mass or duct dilation. Spleen: Normal size. No mass. Adrenals/Urinary Tract: Normal adrenals. No hydronephrosis. Nonobstructing 4 mm upper right renal stone. No renal mass. Normal bladder. Stomach/Bowel: Small hiatal hernia. Otherwise collapsed and grossly normal stomach. Normal caliber small bowel with no small bowel wall thickening. Appendectomy. Mild sigmoid diverticulosis, with no large bowel wall thickening or pericolonic  fat stranding. Oral contrast reaches the rectum. Vascular/Lymphatic: Atherosclerotic nonaneurysmal abdominal aorta. Patent portal, splenic, hepatic and renal veins. No pathologically enlarged lymph nodes in the abdomen or pelvis. Reproductive: Stable top-normal size prostate with coarse nonspecific internal prostatic calcifications. Other: No pneumoperitoneum, ascites or focal fluid collection. Musculoskeletal: No aggressive appearing focal osseous lesions. Moderate lumbar spondylosis. IMPRESSION: 1. Complete response by CT. No residual lymphadenopathy in the chest, abdomen or pelvis. Previously described pulmonary nodules on 03/20/2016 PET-CT have resolved. 2. Extensive patchy peribronchovascular ground-glass attenuation and reticulation throughout both lungs, upper lobe predominant, decreased since 06/17/2016. Differential considerations include improving pulmonary drug toxicity or opportunistic/atypical infection. 3. Additional findings include aortic atherosclerosis, left main and 3 vessel coronary atherosclerosis, stable trace pericardial effusion/thickening, nonobstructing right renal stone, small hiatal hernia and mild sigmoid diverticulosis. Electronically Signed   By: Ilona Sorrel M.D.   On: 06/23/2016 07:39   Ct Angio Chest Pe W Or Wo Contrast  Result Date: 06/17/2016 CLINICAL DATA:  Shortness of breath and fevers EXAM: CT ANGIOGRAPHY CHEST WITH CONTRAST TECHNIQUE: Multidetector CT imaging of the chest was performed using the standard protocol during bolus administration of intravenous contrast. Multiplanar CT image reconstructions and MIPs were obtained to evaluate the vascular anatomy. CONTRAST:  100 mL Isovue 370. COMPARISON:  03/12/2016 FINDINGS:  Cardiovascular: Thoracic aorta is within normal limits without significant atherosclerotic calcification or aneurysmal dilatation. Coronary calcifications are seen. The pulmonary artery is well visualized and demonstrates a normal branching pattern. No  filling defects to suggest pulmonary emboli are identified. Mediastinum/Nodes: The thoracic inlet is within normal limits. No significant mediastinal adenopathy is noted. Small bilateral hilar lymph nodes are seen. These are roughly similar to that noted on the prior exam but evaluation is somewhat difficult due to lack of IV contrast. Previously seen subcarinal adenopathy has resolved as has the left infrahilar mass lesion. No axillary adenopathy is noted. Lungs/Pleura: Lungs are well aerated bilaterally and demonstrate a patchy ground-glass infiltrate consistent with acute pneumonia. The nodular density noted in the left lower lobe along the fissure on previous exam as likely related to underlying treatment. Tiny bilateral pleural effusions are noted. Stable small nodule along the major fissure on the right unchanged from prior exam. Lingular density seen previously are no longer present. Upper Abdomen: Within normal limits. Musculoskeletal: Degenerative change of the thoracic spine is noted. Review of the MIP images confirms the above findings. IMPRESSION: Resolution of previously seen nodular densities within the lingula and left lower lobe but. The known left infrahilar mass lesion has resolved as well consistent with treatment. Stable small nodule along the major fissure on the right is seen. Bilateral ground-glass infiltrates consistent with atypical pneumonia. Some small lymph nodes are noted within the hila likely reactive in nature. Electronically Signed   By: Inez Catalina M.D.   On: 06/17/2016 12:13   Ct Abdomen Pelvis W Contrast  Result Date: 06/23/2016 CLINICAL DATA:  Re- stage diffuse large B-cell lymphoma diagnosed August 2017 status post chemotherapy. EXAM: CT CHEST, ABDOMEN, AND PELVIS WITH CONTRAST TECHNIQUE: Multidetector CT imaging of the chest, abdomen and pelvis was performed following the standard protocol during bolus administration of intravenous contrast. CONTRAST:  146mL ISOVUE-300  IOPAMIDOL (ISOVUE-300) INJECTION 61% COMPARISON:  06/17/2016 chest CT angiogram.  03/20/2016 PET-CT. FINDINGS: CT CHEST FINDINGS Cardiovascular: Normal heart size. Stable trace pericardial effusion/ thickening. Left subclavian MediPort terminates at the cavoatrial junction. Left main, left anterior descending, left circumflex and right coronary atherosclerosis. Atherosclerotic nonaneurysmal thoracic aorta. Normal caliber pulmonary arteries. No central pulmonary emboli. Mediastinum/Nodes: No discrete thyroid nodules. Unremarkable esophagus. No axillary adenopathy. No pathologically enlarged mediastinal or hilar nodes. Lungs/Pleura: No pneumothorax. No pleural effusion. Mild perifissural nodularity along both major fissures measuring up to 6 mm on the right (series 4/ image 73) is stable since 03/20/2016 and probably benign. The additional previously described pulmonary nodules on the 03/20/2016 PET-CT study have resolved. No acute consolidative airspace disease or new significant pulmonary nodules. Extensive patchy predominantly peribronchovascular ground-glass attenuation and reticulation throughout both lungs, predominantly in the upper lungs, is new since 03/20/2016 and decreased since 06/17/2016. Musculoskeletal: No aggressive appearing focal osseous lesions. Moderate thoracic spondylosis. CT ABDOMEN PELVIS FINDINGS Hepatobiliary: Normal liver with no liver mass. Normal gallbladder with no radiopaque cholelithiasis. No biliary ductal dilatation. Pancreas: Normal, with no mass or duct dilation. Spleen: Normal size. No mass. Adrenals/Urinary Tract: Normal adrenals. No hydronephrosis. Nonobstructing 4 mm upper right renal stone. No renal mass. Normal bladder. Stomach/Bowel: Small hiatal hernia. Otherwise collapsed and grossly normal stomach. Normal caliber small bowel with no small bowel wall thickening. Appendectomy. Mild sigmoid diverticulosis, with no large bowel wall thickening or pericolonic fat stranding. Oral  contrast reaches the rectum. Vascular/Lymphatic: Atherosclerotic nonaneurysmal abdominal aorta. Patent portal, splenic, hepatic and renal veins. No pathologically enlarged lymph nodes in the abdomen or  pelvis. Reproductive: Stable top-normal size prostate with coarse nonspecific internal prostatic calcifications. Other: No pneumoperitoneum, ascites or focal fluid collection. Musculoskeletal: No aggressive appearing focal osseous lesions. Moderate lumbar spondylosis. IMPRESSION: 1. Complete response by CT. No residual lymphadenopathy in the chest, abdomen or pelvis. Previously described pulmonary nodules on 03/20/2016 PET-CT have resolved. 2. Extensive patchy peribronchovascular ground-glass attenuation and reticulation throughout both lungs, upper lobe predominant, decreased since 06/17/2016. Differential considerations include improving pulmonary drug toxicity or opportunistic/atypical infection. 3. Additional findings include aortic atherosclerosis, left main and 3 vessel coronary atherosclerosis, stable trace pericardial effusion/thickening, nonobstructing right renal stone, small hiatal hernia and mild sigmoid diverticulosis. Electronically Signed   By: Ilona Sorrel M.D.   On: 06/23/2016 07:39    ASSESSMENT AND PLAN: This is a very pleasant 80 years old white male with recently diagnosed at least a stage II large B-cell non-Hodgkin lymphoma involving the lymph nodes in the chest and abdomen. There is no involvement of the bone marrow or the liver or spleen diagnosed in August 2017. The patient is currently undergoing systemic chemotherapy with CHOP/Rituxan is status post 3 cycles. He tolerated the sort cycle of his treatment well except for pancytopenia as well as admission to the hospital for treatment of pneumonia The recent CT scan of the chest, abdomen and pelvis showed complete response to the first cycle of the treatment with no evidence of residual lymphadenopathy. He continues to have extensive  patchy peribronchovascular groundglass attenuation suspicious for inflammatory process or drug effect. I discussed the scan results with the patient and his wife. I recommended for him to continue his treatment with CHOP/Rituxan but I will delay the start of cycle #4 by 1 week to give the patient more time to recover from the recent pneumonia. He will come back for follow-up visit in 4 weeks for evaluation before starting cycle #5.  There is advised to call immediately if he has any concerning symptoms in the interval. The patient voices understanding of current disease status and treatment options and is in agreement with the current care plan.  All questions were answered. The patient knows to call the clinic with any problems, questions or concerns. We can certainly see the patient much sooner if necessary.  Disclaimer: This note was dictated with voice recognition software. Similar sounding words can inadvertently be transcribed and may not be corrected upon review.

## 2016-07-03 ENCOUNTER — Other Ambulatory Visit (HOSPITAL_BASED_OUTPATIENT_CLINIC_OR_DEPARTMENT_OTHER): Payer: Medicare Other

## 2016-07-03 ENCOUNTER — Ambulatory Visit (HOSPITAL_BASED_OUTPATIENT_CLINIC_OR_DEPARTMENT_OTHER): Payer: Medicare Other

## 2016-07-03 VITALS — BP 92/49 | HR 85 | Temp 97.8°F | Resp 18

## 2016-07-03 DIAGNOSIS — C8338 Diffuse large B-cell lymphoma, lymph nodes of multiple sites: Secondary | ICD-10-CM | POA: Diagnosis not present

## 2016-07-03 DIAGNOSIS — Z5112 Encounter for antineoplastic immunotherapy: Secondary | ICD-10-CM | POA: Diagnosis not present

## 2016-07-03 DIAGNOSIS — Z5111 Encounter for antineoplastic chemotherapy: Secondary | ICD-10-CM | POA: Diagnosis not present

## 2016-07-03 DIAGNOSIS — Z5189 Encounter for other specified aftercare: Secondary | ICD-10-CM | POA: Diagnosis not present

## 2016-07-03 LAB — COMPREHENSIVE METABOLIC PANEL
ALT: 22 U/L (ref 0–55)
ANION GAP: 8 meq/L (ref 3–11)
AST: 21 U/L (ref 5–34)
Albumin: 2.7 g/dL — ABNORMAL LOW (ref 3.5–5.0)
Alkaline Phosphatase: 75 U/L (ref 40–150)
BUN: 14.6 mg/dL (ref 7.0–26.0)
CHLORIDE: 105 meq/L (ref 98–109)
CO2: 26 meq/L (ref 22–29)
Calcium: 9.5 mg/dL (ref 8.4–10.4)
Creatinine: 1 mg/dL (ref 0.7–1.3)
EGFR: 70 mL/min/{1.73_m2} — AB (ref >90)
GLUCOSE: 167 mg/dL — AB (ref 70–140)
Potassium: 4.2 mEq/L (ref 3.5–5.1)
SODIUM: 140 meq/L (ref 136–145)
Total Bilirubin: 0.45 mg/dL (ref 0.20–1.20)
Total Protein: 6.1 g/dL — ABNORMAL LOW (ref 6.4–8.3)

## 2016-07-03 LAB — CBC WITH DIFFERENTIAL/PLATELET
BASO%: 0.8 % (ref 0.0–2.0)
Basophils Absolute: 0.1 10*3/uL (ref 0.0–0.1)
EOS%: 0.5 % (ref 0.0–7.0)
Eosinophils Absolute: 0 10*3/uL (ref 0.0–0.5)
HCT: 34.7 % — ABNORMAL LOW (ref 38.4–49.9)
HGB: 11.4 g/dL — ABNORMAL LOW (ref 13.0–17.1)
LYMPH%: 8.9 % — AB (ref 14.0–49.0)
MCH: 32.4 pg (ref 27.2–33.4)
MCHC: 32.9 g/dL (ref 32.0–36.0)
MCV: 98.7 fL — AB (ref 79.3–98.0)
MONO#: 1.3 10*3/uL — AB (ref 0.1–0.9)
MONO%: 17.7 % — AB (ref 0.0–14.0)
NEUT%: 72.1 % (ref 39.0–75.0)
NEUTROS ABS: 5.4 10*3/uL (ref 1.5–6.5)
PLATELETS: 231 10*3/uL (ref 140–400)
RBC: 3.52 10*6/uL — AB (ref 4.20–5.82)
RDW: 16.4 % — ABNORMAL HIGH (ref 11.0–14.6)
WBC: 7.4 10*3/uL (ref 4.0–10.3)
lymph#: 0.7 10*3/uL — ABNORMAL LOW (ref 0.9–3.3)

## 2016-07-03 LAB — LACTATE DEHYDROGENASE: LDH: 211 U/L (ref 125–245)

## 2016-07-03 MED ORDER — SODIUM CHLORIDE 0.9 % IV SOLN
375.0000 mg/m2 | Freq: Once | INTRAVENOUS | Status: AC
  Administered 2016-07-03: 800 mg via INTRAVENOUS
  Filled 2016-07-03: qty 50

## 2016-07-03 MED ORDER — DIPHENHYDRAMINE HCL 25 MG PO CAPS
50.0000 mg | ORAL_CAPSULE | Freq: Once | ORAL | Status: AC
  Administered 2016-07-03: 50 mg via ORAL

## 2016-07-03 MED ORDER — PEGFILGRASTIM 6 MG/0.6ML ~~LOC~~ PSKT
6.0000 mg | PREFILLED_SYRINGE | Freq: Once | SUBCUTANEOUS | Status: AC
  Administered 2016-07-03: 6 mg via SUBCUTANEOUS
  Filled 2016-07-03: qty 0.6

## 2016-07-03 MED ORDER — SODIUM CHLORIDE 0.9 % IV SOLN
750.0000 mg/m2 | Freq: Once | INTRAVENOUS | Status: AC
  Administered 2016-07-03: 1560 mg via INTRAVENOUS
  Filled 2016-07-03: qty 78

## 2016-07-03 MED ORDER — DEXAMETHASONE SODIUM PHOSPHATE 10 MG/ML IJ SOLN
INTRAMUSCULAR | Status: AC
  Filled 2016-07-03: qty 1

## 2016-07-03 MED ORDER — ACETAMINOPHEN 325 MG PO TABS
ORAL_TABLET | ORAL | Status: AC
  Filled 2016-07-03: qty 2

## 2016-07-03 MED ORDER — PALONOSETRON HCL INJECTION 0.25 MG/5ML
INTRAVENOUS | Status: AC
  Filled 2016-07-03: qty 5

## 2016-07-03 MED ORDER — PALONOSETRON HCL INJECTION 0.25 MG/5ML
0.2500 mg | Freq: Once | INTRAVENOUS | Status: AC
  Administered 2016-07-03: 0.25 mg via INTRAVENOUS

## 2016-07-03 MED ORDER — DOXORUBICIN HCL CHEMO IV INJECTION 2 MG/ML
50.0000 mg/m2 | Freq: Once | INTRAVENOUS | Status: AC
  Administered 2016-07-03: 104 mg via INTRAVENOUS
  Filled 2016-07-03: qty 52

## 2016-07-03 MED ORDER — SODIUM CHLORIDE 0.9% FLUSH
10.0000 mL | INTRAVENOUS | Status: DC | PRN
  Administered 2016-07-03: 10 mL
  Filled 2016-07-03: qty 10

## 2016-07-03 MED ORDER — ACETAMINOPHEN 325 MG PO TABS
650.0000 mg | ORAL_TABLET | Freq: Once | ORAL | Status: AC
  Administered 2016-07-03: 650 mg via ORAL

## 2016-07-03 MED ORDER — VINCRISTINE SULFATE CHEMO INJECTION 1 MG/ML
2.0000 mg | Freq: Once | INTRAVENOUS | Status: AC
  Administered 2016-07-03: 2 mg via INTRAVENOUS
  Filled 2016-07-03: qty 2

## 2016-07-03 MED ORDER — HEPARIN SOD (PORK) LOCK FLUSH 100 UNIT/ML IV SOLN
500.0000 [IU] | Freq: Once | INTRAVENOUS | Status: AC | PRN
  Administered 2016-07-03: 500 [IU]
  Filled 2016-07-03: qty 5

## 2016-07-03 MED ORDER — DEXAMETHASONE SODIUM PHOSPHATE 10 MG/ML IJ SOLN
10.0000 mg | Freq: Once | INTRAMUSCULAR | Status: AC
  Administered 2016-07-03: 10 mg via INTRAVENOUS

## 2016-07-03 MED ORDER — DIPHENHYDRAMINE HCL 25 MG PO CAPS
ORAL_CAPSULE | ORAL | Status: AC
  Filled 2016-07-03: qty 2

## 2016-07-03 MED ORDER — SODIUM CHLORIDE 0.9 % IV SOLN
Freq: Once | INTRAVENOUS | Status: AC
  Administered 2016-07-03: 12:00:00 via INTRAVENOUS

## 2016-07-03 NOTE — Progress Notes (Signed)
Started Rapid Rituxan at 100 ml/ hr as ordered.  BP started low at 92/52.  Pt states low BP is normal for him, but not usually this low.  BP down to 88/49 after 30 minutes.   Pt did not have any symptoms of reaction,  Did not have any complaints. Notified Dr. Julien Nordmann and he instructed to not increase rate, but hold at 100 ml/hr for remainder of infusion.  Also give bolus 500 cc NS over one hour concurrently w/ Rituxan.   Discussed w/ pt he may be a little dehydrated and to increase fluid intake at home which may improve his BP.  Pt verbalized understanding.

## 2016-07-03 NOTE — Patient Instructions (Signed)
[facility] Discharge Instructions for Patients Receiving Chemotherapy  Today you received the following chemotherapy agents: Adriamycin, Vincristine, Cytoxan and Rituxan   To help prevent nausea and vomiting after your treatment, we encourage you to take your nausea medication as directed.    If you develop nausea and vomiting that is not controlled by your nausea medication, call the clinic.   BELOW ARE SYMPTOMS THAT SHOULD BE REPORTED IMMEDIATELY:  *FEVER GREATER THAN 100.5 F  *CHILLS WITH OR WITHOUT FEVER  NAUSEA AND VOMITING THAT IS NOT CONTROLLED WITH YOUR NAUSEA MEDICATION  *UNUSUAL SHORTNESS OF BREATH  *UNUSUAL BRUISING OR BLEEDING  TENDERNESS IN MOUTH AND THROAT WITH OR WITHOUT PRESENCE OF ULCERS  *URINARY PROBLEMS  *BOWEL PROBLEMS  UNUSUAL RASH Items with * indicate a potential emergency and should be followed up as soon as possible.  Feel free to call the clinic you have any questions or concerns. The clinic phone number is [phone].  Please show the CHEMO ALERT CARD at check-in to the Emergency Department and triage nurse.   

## 2016-07-10 ENCOUNTER — Telehealth: Payer: Self-pay | Admitting: Medical Oncology

## 2016-07-10 ENCOUNTER — Other Ambulatory Visit (HOSPITAL_BASED_OUTPATIENT_CLINIC_OR_DEPARTMENT_OTHER): Payer: Medicare Other

## 2016-07-10 DIAGNOSIS — C8338 Diffuse large B-cell lymphoma, lymph nodes of multiple sites: Secondary | ICD-10-CM | POA: Diagnosis not present

## 2016-07-10 LAB — COMPREHENSIVE METABOLIC PANEL
ALT: 18 U/L (ref 0–55)
AST: 11 U/L (ref 5–34)
Albumin: 2.6 g/dL — ABNORMAL LOW (ref 3.5–5.0)
Alkaline Phosphatase: 77 U/L (ref 40–150)
Anion Gap: 9 mEq/L (ref 3–11)
BUN: 22.4 mg/dL (ref 7.0–26.0)
CALCIUM: 8.6 mg/dL (ref 8.4–10.4)
CHLORIDE: 101 meq/L (ref 98–109)
CO2: 26 meq/L (ref 22–29)
Creatinine: 0.9 mg/dL (ref 0.7–1.3)
EGFR: 79 mL/min/{1.73_m2} — ABNORMAL LOW (ref >90)
GLUCOSE: 125 mg/dL (ref 70–140)
Potassium: 4 mEq/L (ref 3.5–5.1)
Sodium: 136 mEq/L (ref 136–145)
Total Bilirubin: 0.84 mg/dL (ref 0.20–1.20)
Total Protein: 4.9 g/dL — ABNORMAL LOW (ref 6.4–8.3)

## 2016-07-10 LAB — CBC WITH DIFFERENTIAL/PLATELET
BASO%: 0 % (ref 0.0–2.0)
Basophils Absolute: 0 10*3/uL (ref 0.0–0.1)
EOS%: 9.8 % — ABNORMAL HIGH (ref 0.0–7.0)
Eosinophils Absolute: 0.1 10*3/uL (ref 0.0–0.5)
HEMATOCRIT: 29.8 % — AB (ref 38.4–49.9)
HGB: 9.9 g/dL — ABNORMAL LOW (ref 13.0–17.1)
LYMPH#: 0.3 10*3/uL — AB (ref 0.9–3.3)
LYMPH%: 49 % (ref 14.0–49.0)
MCH: 32 pg (ref 27.2–33.4)
MCHC: 33.2 g/dL (ref 32.0–36.0)
MCV: 96.4 fL (ref 79.3–98.0)
MONO#: 0 10*3/uL — AB (ref 0.1–0.9)
MONO%: 7.8 % (ref 0.0–14.0)
NEUT%: 33.4 % — ABNORMAL LOW (ref 39.0–75.0)
NEUTROS ABS: 0.2 10*3/uL — AB (ref 1.5–6.5)
Platelets: 23 10*3/uL — ABNORMAL LOW (ref 140–400)
RBC: 3.09 10*6/uL — ABNORMAL LOW (ref 4.20–5.82)
RDW: 15.8 % — ABNORMAL HIGH (ref 11.0–14.6)
WBC: 0.5 10*3/uL — AB (ref 4.0–10.3)
nRBC: 0 % (ref 0–0)

## 2016-07-10 LAB — LACTATE DEHYDROGENASE: LDH: 138 U/L (ref 125–245)

## 2016-07-10 MED ORDER — CIPROFLOXACIN HCL 500 MG PO TABS: 500.0000 mg | ORAL_TABLET | Freq: Two times a day (BID) | ORAL | 0 refills | Status: AC

## 2016-07-10 NOTE — Telephone Encounter (Signed)
Pt notified of compromised immune system and neutrapenic and bleeding precautions. Antibiotic sent to pharmacy and pt aware to start it.

## 2016-07-13 DIAGNOSIS — H9190 Unspecified hearing loss, unspecified ear: Secondary | ICD-10-CM | POA: Diagnosis not present

## 2016-07-13 DIAGNOSIS — Z0000 Encounter for general adult medical examination without abnormal findings: Secondary | ICD-10-CM | POA: Diagnosis not present

## 2016-07-17 ENCOUNTER — Other Ambulatory Visit (HOSPITAL_BASED_OUTPATIENT_CLINIC_OR_DEPARTMENT_OTHER): Payer: Medicare Other

## 2016-07-17 ENCOUNTER — Ambulatory Visit: Payer: Medicare Other

## 2016-07-17 ENCOUNTER — Ambulatory Visit: Payer: Medicare Other | Admitting: Internal Medicine

## 2016-07-17 DIAGNOSIS — C8338 Diffuse large B-cell lymphoma, lymph nodes of multiple sites: Secondary | ICD-10-CM | POA: Diagnosis not present

## 2016-07-17 LAB — COMPREHENSIVE METABOLIC PANEL
ALT: 18 U/L (ref 0–55)
ANION GAP: 10 meq/L (ref 3–11)
AST: 19 U/L (ref 5–34)
Albumin: 2.5 g/dL — ABNORMAL LOW (ref 3.5–5.0)
Alkaline Phosphatase: 70 U/L (ref 40–150)
BUN: 10.2 mg/dL (ref 7.0–26.0)
CO2: 22 meq/L (ref 22–29)
Calcium: 8.5 mg/dL (ref 8.4–10.4)
Chloride: 107 mEq/L (ref 98–109)
Creatinine: 0.9 mg/dL (ref 0.7–1.3)
EGFR: 77 mL/min/{1.73_m2} — AB (ref >90)
GLUCOSE: 148 mg/dL — AB (ref 70–140)
Potassium: 3.7 mEq/L (ref 3.5–5.1)
SODIUM: 139 meq/L (ref 136–145)
Total Bilirubin: 0.49 mg/dL (ref 0.20–1.20)
Total Protein: 5.1 g/dL — ABNORMAL LOW (ref 6.4–8.3)

## 2016-07-17 LAB — URIC ACID: URIC ACID, SERUM: 4.8 mg/dL (ref 2.6–7.4)

## 2016-07-17 LAB — CBC WITH DIFFERENTIAL/PLATELET
BASO%: 0.6 % (ref 0.0–2.0)
Basophils Absolute: 0 10*3/uL (ref 0.0–0.1)
EOS%: 2 % (ref 0.0–7.0)
Eosinophils Absolute: 0.1 10*3/uL (ref 0.0–0.5)
HCT: 28.7 % — ABNORMAL LOW (ref 38.4–49.9)
HGB: 9.3 g/dL — ABNORMAL LOW (ref 13.0–17.1)
LYMPH%: 10.9 % — AB (ref 14.0–49.0)
MCH: 32.2 pg (ref 27.2–33.4)
MCHC: 32.4 g/dL (ref 32.0–36.0)
MCV: 99.3 fL — AB (ref 79.3–98.0)
MONO#: 0.4 10*3/uL (ref 0.1–0.9)
MONO%: 13.7 % (ref 0.0–14.0)
NEUT#: 2.4 10*3/uL (ref 1.5–6.5)
NEUT%: 72.8 % (ref 39.0–75.0)
PLATELETS: 68 10*3/uL — AB (ref 140–400)
RBC: 2.89 10*6/uL — AB (ref 4.20–5.82)
RDW: 16.1 % — ABNORMAL HIGH (ref 11.0–14.6)
WBC: 3.3 10*3/uL — AB (ref 4.0–10.3)
lymph#: 0.4 10*3/uL — ABNORMAL LOW (ref 0.9–3.3)

## 2016-07-17 LAB — LACTATE DEHYDROGENASE: LDH: 167 U/L (ref 125–245)

## 2016-07-24 ENCOUNTER — Ambulatory Visit (HOSPITAL_BASED_OUTPATIENT_CLINIC_OR_DEPARTMENT_OTHER): Payer: Medicare Other | Admitting: Internal Medicine

## 2016-07-24 ENCOUNTER — Encounter: Payer: Self-pay | Admitting: Internal Medicine

## 2016-07-24 ENCOUNTER — Ambulatory Visit (HOSPITAL_BASED_OUTPATIENT_CLINIC_OR_DEPARTMENT_OTHER): Payer: Medicare Other

## 2016-07-24 ENCOUNTER — Other Ambulatory Visit (HOSPITAL_BASED_OUTPATIENT_CLINIC_OR_DEPARTMENT_OTHER): Payer: Medicare Other

## 2016-07-24 VITALS — BP 104/60 | HR 104 | Temp 97.9°F | Resp 17 | Wt 185.3 lb

## 2016-07-24 VITALS — BP 104/59 | HR 85 | Temp 98.2°F | Resp 20

## 2016-07-24 DIAGNOSIS — Z5189 Encounter for other specified aftercare: Secondary | ICD-10-CM | POA: Diagnosis not present

## 2016-07-24 DIAGNOSIS — C8338 Diffuse large B-cell lymphoma, lymph nodes of multiple sites: Secondary | ICD-10-CM

## 2016-07-24 DIAGNOSIS — Z5111 Encounter for antineoplastic chemotherapy: Secondary | ICD-10-CM

## 2016-07-24 DIAGNOSIS — Z5112 Encounter for antineoplastic immunotherapy: Secondary | ICD-10-CM

## 2016-07-24 DIAGNOSIS — R5383 Other fatigue: Secondary | ICD-10-CM

## 2016-07-24 LAB — CBC WITH DIFFERENTIAL/PLATELET
BASO%: 0.7 % (ref 0.0–2.0)
BASOS ABS: 0 10*3/uL (ref 0.0–0.1)
EOS ABS: 0.1 10*3/uL (ref 0.0–0.5)
EOS%: 1.6 % (ref 0.0–7.0)
HEMATOCRIT: 30.4 % — AB (ref 38.4–49.9)
HEMOGLOBIN: 9.9 g/dL — AB (ref 13.0–17.1)
LYMPH#: 0.7 10*3/uL — AB (ref 0.9–3.3)
LYMPH%: 12 % — ABNORMAL LOW (ref 14.0–49.0)
MCH: 32.5 pg (ref 27.2–33.4)
MCHC: 32.7 g/dL (ref 32.0–36.0)
MCV: 99.6 fL — AB (ref 79.3–98.0)
MONO#: 1.1 10*3/uL — ABNORMAL HIGH (ref 0.1–0.9)
MONO%: 20.9 % — AB (ref 0.0–14.0)
NEUT#: 3.5 10*3/uL (ref 1.5–6.5)
NEUT%: 64.8 % (ref 39.0–75.0)
Platelets: 164 10*3/uL (ref 140–400)
RBC: 3.05 10*6/uL — ABNORMAL LOW (ref 4.20–5.82)
RDW: 17.7 % — AB (ref 11.0–14.6)
WBC: 5.4 10*3/uL (ref 4.0–10.3)

## 2016-07-24 LAB — COMPREHENSIVE METABOLIC PANEL
ALK PHOS: 73 U/L (ref 40–150)
ALT: 22 U/L (ref 0–55)
AST: 24 U/L (ref 5–34)
Albumin: 2.7 g/dL — ABNORMAL LOW (ref 3.5–5.0)
Anion Gap: 8 mEq/L (ref 3–11)
BILIRUBIN TOTAL: 0.42 mg/dL (ref 0.20–1.20)
BUN: 10.2 mg/dL (ref 7.0–26.0)
CALCIUM: 9.2 mg/dL (ref 8.4–10.4)
CO2: 26 mEq/L (ref 22–29)
CREATININE: 0.9 mg/dL (ref 0.7–1.3)
Chloride: 105 mEq/L (ref 98–109)
EGFR: 83 mL/min/{1.73_m2} — ABNORMAL LOW (ref >90)
Glucose: 120 mg/dl (ref 70–140)
Potassium: 4.1 mEq/L (ref 3.5–5.1)
Sodium: 139 mEq/L (ref 136–145)
TOTAL PROTEIN: 5.5 g/dL — AB (ref 6.4–8.3)

## 2016-07-24 LAB — LACTATE DEHYDROGENASE: LDH: 242 U/L (ref 125–245)

## 2016-07-24 MED ORDER — DIPHENHYDRAMINE HCL 25 MG PO CAPS
50.0000 mg | ORAL_CAPSULE | Freq: Once | ORAL | Status: AC
  Administered 2016-07-24: 50 mg via ORAL

## 2016-07-24 MED ORDER — DIPHENHYDRAMINE HCL 25 MG PO CAPS
ORAL_CAPSULE | ORAL | Status: AC
  Filled 2016-07-24: qty 2

## 2016-07-24 MED ORDER — ACETAMINOPHEN 325 MG PO TABS
650.0000 mg | ORAL_TABLET | Freq: Once | ORAL | Status: AC
  Administered 2016-07-24: 650 mg via ORAL

## 2016-07-24 MED ORDER — SODIUM CHLORIDE 0.9 % IV SOLN
Freq: Once | INTRAVENOUS | Status: AC
  Administered 2016-07-24: 14:00:00 via INTRAVENOUS

## 2016-07-24 MED ORDER — PALONOSETRON HCL INJECTION 0.25 MG/5ML
INTRAVENOUS | Status: AC
  Filled 2016-07-24: qty 5

## 2016-07-24 MED ORDER — PEGFILGRASTIM 6 MG/0.6ML ~~LOC~~ PSKT
6.0000 mg | PREFILLED_SYRINGE | Freq: Once | SUBCUTANEOUS | Status: AC
  Administered 2016-07-24: 6 mg via SUBCUTANEOUS
  Filled 2016-07-24: qty 0.6

## 2016-07-24 MED ORDER — VINCRISTINE SULFATE CHEMO INJECTION 1 MG/ML
2.0000 mg | Freq: Once | INTRAVENOUS | Status: AC
  Administered 2016-07-24: 2 mg via INTRAVENOUS
  Filled 2016-07-24: qty 2

## 2016-07-24 MED ORDER — CYCLOPHOSPHAMIDE CHEMO INJECTION 1 GM
750.0000 mg/m2 | Freq: Once | INTRAMUSCULAR | Status: AC
  Administered 2016-07-24: 1560 mg via INTRAVENOUS
  Filled 2016-07-24: qty 78

## 2016-07-24 MED ORDER — DEXAMETHASONE SODIUM PHOSPHATE 10 MG/ML IJ SOLN
INTRAMUSCULAR | Status: AC
  Filled 2016-07-24: qty 1

## 2016-07-24 MED ORDER — HEPARIN SOD (PORK) LOCK FLUSH 100 UNIT/ML IV SOLN
500.0000 [IU] | Freq: Once | INTRAVENOUS | Status: AC | PRN
  Administered 2016-07-24: 500 [IU]
  Filled 2016-07-24: qty 5

## 2016-07-24 MED ORDER — SODIUM CHLORIDE 0.9 % IV SOLN
375.0000 mg/m2 | Freq: Once | INTRAVENOUS | Status: AC
  Administered 2016-07-24: 800 mg via INTRAVENOUS
  Filled 2016-07-24: qty 50

## 2016-07-24 MED ORDER — PALONOSETRON HCL INJECTION 0.25 MG/5ML
0.2500 mg | Freq: Once | INTRAVENOUS | Status: AC
  Administered 2016-07-24: 0.25 mg via INTRAVENOUS

## 2016-07-24 MED ORDER — DOXORUBICIN HCL CHEMO IV INJECTION 2 MG/ML
50.0000 mg/m2 | Freq: Once | INTRAVENOUS | Status: AC
  Administered 2016-07-24: 104 mg via INTRAVENOUS
  Filled 2016-07-24: qty 52

## 2016-07-24 MED ORDER — ACETAMINOPHEN 325 MG PO TABS
ORAL_TABLET | ORAL | Status: AC
  Filled 2016-07-24: qty 2

## 2016-07-24 MED ORDER — DEXAMETHASONE SODIUM PHOSPHATE 10 MG/ML IJ SOLN
10.0000 mg | Freq: Once | INTRAMUSCULAR | Status: AC
  Administered 2016-07-24: 10 mg via INTRAVENOUS

## 2016-07-24 MED ORDER — SODIUM CHLORIDE 0.9% FLUSH
10.0000 mL | INTRAVENOUS | Status: DC | PRN
  Administered 2016-07-24: 10 mL
  Filled 2016-07-24: qty 10

## 2016-07-24 NOTE — Patient Instructions (Signed)
[facility] Discharge Instructions for Patients Receiving Chemotherapy  Today you received the following chemotherapy agents: Adriamycin, Vincristine, Cytoxan and Rituxan   To help prevent nausea and vomiting after your treatment, we encourage you to take your nausea medication as directed.    If you develop nausea and vomiting that is not controlled by your nausea medication, call the clinic.   BELOW ARE SYMPTOMS THAT SHOULD BE REPORTED IMMEDIATELY:  *FEVER GREATER THAN 100.5 F  *CHILLS WITH OR WITHOUT FEVER  NAUSEA AND VOMITING THAT IS NOT CONTROLLED WITH YOUR NAUSEA MEDICATION  *UNUSUAL SHORTNESS OF BREATH  *UNUSUAL BRUISING OR BLEEDING  TENDERNESS IN MOUTH AND THROAT WITH OR WITHOUT PRESENCE OF ULCERS  *URINARY PROBLEMS  *BOWEL PROBLEMS  UNUSUAL RASH Items with * indicate a potential emergency and should be followed up as soon as possible.  Feel free to call the clinic you have any questions or concerns. The clinic phone number is [phone].  Please show the CHEMO ALERT CARD at check-in to the Emergency Department and triage nurse.   

## 2016-07-24 NOTE — Progress Notes (Signed)
[facility] Telephone:([phone]   Fax:([phone]) [place], MD [place] [address]  DIAGNOSIS: Stage II large B-cell non-Hodgkin lymphoma diagnosed in August 2017.   PRIOR THERAPY: None.  CURRENT THERAPY: Systemic chemotherapy with CHOP/Rituxan every 3 weeks with Neulasta support. First dose 04/24/2016. Status post 4 cycles  INTERVAL HISTORY: Seth Gilbert 80 y.o. male returns to the clinic today for follow-up visit accompanied by his wife. The patient is feeling fine today with no specific complaints except for fatigue. He enjoyed his Thanksgiving with his family. He tolerated the last cycle of systemic chemotherapy with CHOP/Rituxan fairly well except for pancytopenia which resolved spontaneously. He'll receive Neulasta injection after his chemotherapy. He was given 5 days treatment with Cipro during the period of his severe neutropenia. He has no significant weight loss or night sweats. He denied having any significant chest pain, shortness of breath, cough or hemoptysis. He denied having any nausea, vomiting, diarrhea or constipation. He has no fever or chills. He is here today for evaluation before starting cycle #5.  MEDICAL HISTORY: Past Medical History:  Diagnosis Date  . Cancer ([place])    NHL  . Diverticulitis   . GERD (gastroesophageal reflux disease)    Heartburn at times  . Heart murmur    "comes and goes" onset 4th grade - last time anyone heard it was 01/2014  . History of hiatal hernia   . History of kidney stones   . HOH (hard of hearing)   . PONV (postoperative nausea and vomiting)   . Rosacea   . Wears glasses     ALLERGIES:  is allergic to no known allergies.  MEDICATIONS:  Current Outpatient Prescriptions  Medication Sig Dispense Refill  . acetaminophen (TYLENOL) 500 MG tablet Take 1,000 mg by mouth every 6 (six) hours as needed for moderate pain.    Marland Kitchen allopurinol (ZYLOPRIM) 100 MG  tablet Take 1 tablet (100 mg total) by mouth 2 (two) times daily. 60 tablet 3  . famotidine (PEPCID) 40 MG tablet Take 40 mg by mouth daily as needed for heartburn.     Marland Kitchen HYDROcodone-acetaminophen (NORCO/VICODIN) 5-325 MG tablet Take 1 tablet by mouth every 6 (six) hours as needed for moderate pain. (Patient not taking: Reported on 06/26/2016) 15 tablet 0  . lidocaine-prilocaine (EMLA) cream Apply 1 application topically as needed. 30 g 0  . metroNIDAZOLE (METROGEL) 0.75 % gel Apply 1 application topically daily.     . Multiple Vitamins-Minerals (CENTRUM SILVER PO) Take 1 tablet by mouth daily.     . prochlorperazine (COMPAZINE) 10 MG tablet Take 1 tablet (10 mg total) by mouth every 6 (six) hours as needed for nausea or vomiting. (Patient not taking: Reported on 06/26/2016) 30 tablet 0  . senna (SENOKOT) 8.6 MG tablet Take 1 tablet by mouth daily as needed for constipation.    . vitamin E 400 UNIT capsule Take 400 Units by mouth daily.     No current facility-administered medications for this visit.     SURGICAL HISTORY:  Past Surgical History:  Procedure Laterality Date  . APPENDECTOMY    . COLONOSCOPY    . HEMORROIDECTOMY     inflammed anal gland removed  . INGUINAL HERNIA REPAIR Left 02/11/2014   Procedure: LEFT INGUINAL HERNIA REPAIR WITH MESH ;  Surgeon: Joyice Faster. Cornett, MD;  Location: [facility];  Service: General;  Laterality: Left;  . INSERTION OF  MESH N/A 02/11/2014   Procedure: INSERTION OF MESH;  Surgeon: Joyice Faster. Cornett, MD;  Location: [facility];  Service: General;  Laterality: N/A;  . PORTACATH PLACEMENT Left 04/10/2016   Procedure: INSERTION PORT-A-CATH into LEFT Subclavian Vein with guidance from ultrasound and flouroscopy;  Surgeon: Grace Isaac, MD;  Location: [facility];  Service: Thoracic;  Laterality: Left;  . TONSILLECTOMY    . VIDEO BRONCHOSCOPY WITH ENDOBRONCHIAL ULTRASOUND N/A 03/30/2016   Procedure: VIDEO BRONCHOSCOPY WITH  ENDOBRONCHIAL ULTRASOUND,WITH TRANSBRONCHIAL BIOPSY;  Surgeon: Grace Isaac, MD;  Location: [facility];  Service: Thoracic;  Laterality: N/A;    REVIEW OF SYSTEMS:  A comprehensive review of systems was negative except for: Constitutional: positive for fatigue   PHYSICAL EXAMINATION: General appearance: alert, cooperative and no distress Head: Normocephalic, without obvious abnormality, atraumatic Neck: no adenopathy, no JVD, supple, symmetrical, trachea midline and thyroid not enlarged, symmetric, no tenderness/mass/nodules Lymph nodes: Cervical, supraclavicular, and axillary nodes normal. Resp: clear to auscultation bilaterally Back: symmetric, no curvature. ROM normal. No CVA tenderness. Cardio: regular rate and rhythm, S1, S2 normal, no murmur, click, rub or gallop GI: soft, non-tender; bowel sounds normal; no masses,  no organomegaly Extremities: extremities normal, atraumatic, no cyanosis or edema Neurologic: Alert and oriented X 3, normal strength and tone. Normal symmetric reflexes. Normal coordination and gait  ECOG PERFORMANCE STATUS: 1 - Symptomatic but completely ambulatory  Blood pressure 104/60, pulse (!) 104, temperature 97.9 F (36.6 C), temperature source Oral, resp. rate 17, weight 185 lb 4.8 oz (84.1 kg), SpO2 97 %.  LABORATORY DATA: Lab Results  Component Value Date   WBC 5.4 07/24/2016   HGB 9.9 (L) 07/24/2016   HCT 30.4 (L) 07/24/2016   MCV 99.6 (H) 07/24/2016   PLT 164 07/24/2016      Chemistry      Component Value Date/Time   NA 139 07/24/2016 1134   K 4.1 07/24/2016 1134   CL 109 06/18/2016 0319   CO2 26 07/24/2016 1134   BUN 10.2 07/24/2016 1134   CREATININE 0.9 07/24/2016 1134      Component Value Date/Time   CALCIUM 9.2 07/24/2016 1134   ALKPHOS 73 07/24/2016 1134   AST 24 07/24/2016 1134   ALT 22 07/24/2016 1134   BILITOT 0.42 07/24/2016 1134       RADIOGRAPHIC STUDIES: No results found.  ASSESSMENT AND PLAN: This is a very pleasant  80 years old white male with recently diagnosed at least a stage II large B-cell non-Hodgkin lymphoma involving the lymph nodes in the chest and abdomen. There is no involvement of the bone marrow or the liver or spleen diagnosed in August 2017. The patient is currently undergoing systemic chemotherapy with CHOP/Rituxan is status post 3 cycles. He tolerated the last cycle of his treatment well except for chemotherapy-induced pancytopenia which resolved at this point. The patient is feeling fine today.  I recommended for him to continue his treatment with CHOP/Rituxan and he will proceed with cycle #5 today. He will come back for follow-up visit in 3 weeks for evaluation before starting cycle #6.  There is advised to call immediately if he has any concerning symptoms in the interval. The patient voices understanding of current disease status and treatment options and is in agreement with the current care plan.  All questions were answered. The patient knows to call the clinic with any problems, questions or concerns. We can certainly see the patient much sooner if necessary.  Disclaimer: This note was dictated  with voice recognition software. Similar sounding words can inadvertently be transcribed and may not be corrected upon review.

## 2016-07-24 NOTE — Progress Notes (Signed)
Per Dr. Julien Nordmann ok to proceed with Rituxan with blood pressure reading before Rituxan start of 94/50.

## 2016-07-24 NOTE — Progress Notes (Signed)
Adriamycin pushed over 10 minutes.  Blood present before, during and after administration.

## 2016-07-31 ENCOUNTER — Other Ambulatory Visit (HOSPITAL_BASED_OUTPATIENT_CLINIC_OR_DEPARTMENT_OTHER): Payer: Medicare Other

## 2016-07-31 DIAGNOSIS — C8338 Diffuse large B-cell lymphoma, lymph nodes of multiple sites: Secondary | ICD-10-CM

## 2016-07-31 LAB — COMPREHENSIVE METABOLIC PANEL
ALBUMIN: 2.5 g/dL — AB (ref 3.5–5.0)
ALK PHOS: 68 U/L (ref 40–150)
ALT: 23 U/L (ref 0–55)
AST: 14 U/L (ref 5–34)
Anion Gap: 7 mEq/L (ref 3–11)
BUN: 21.8 mg/dL (ref 7.0–26.0)
CHLORIDE: 104 meq/L (ref 98–109)
CO2: 24 meq/L (ref 22–29)
Calcium: 8.2 mg/dL — ABNORMAL LOW (ref 8.4–10.4)
Creatinine: 0.8 mg/dL (ref 0.7–1.3)
EGFR: 85 mL/min/{1.73_m2} — ABNORMAL LOW (ref >90)
GLUCOSE: 108 mg/dL (ref 70–140)
POTASSIUM: 4.1 meq/L (ref 3.5–5.1)
SODIUM: 135 meq/L — AB (ref 136–145)
Total Bilirubin: 0.87 mg/dL (ref 0.20–1.20)
Total Protein: 4.6 g/dL — ABNORMAL LOW (ref 6.4–8.3)

## 2016-07-31 LAB — LACTATE DEHYDROGENASE: LDH: 160 U/L (ref 125–245)

## 2016-07-31 LAB — CBC WITH DIFFERENTIAL/PLATELET
BASO%: 0 % (ref 0.0–2.0)
Basophils Absolute: 0 10*3/uL (ref 0.0–0.1)
EOS%: 2.5 % (ref 0.0–7.0)
Eosinophils Absolute: 0 10*3/uL (ref 0.0–0.5)
HCT: 25.6 % — ABNORMAL LOW (ref 38.4–49.9)
HGB: 8.6 g/dL — ABNORMAL LOW (ref 13.0–17.1)
LYMPH%: 57.5 % — ABNORMAL HIGH (ref 14.0–49.0)
MCH: 32.7 pg (ref 27.2–33.4)
MCHC: 33.6 g/dL (ref 32.0–36.0)
MCV: 97.3 fL (ref 79.3–98.0)
MONO#: 0 10*3/uL — ABNORMAL LOW (ref 0.1–0.9)
MONO%: 7.5 % (ref 0.0–14.0)
NEUT#: 0.1 10*3/uL — CL (ref 1.5–6.5)
NEUT%: 32.5 % — AB (ref 39.0–75.0)
Platelets: 27 10*3/uL — ABNORMAL LOW (ref 140–400)
RBC: 2.63 10*6/uL — AB (ref 4.20–5.82)
RDW: 16.5 % — AB (ref 11.0–14.6)
WBC: 0.4 10*3/uL — CL (ref 4.0–10.3)
lymph#: 0.2 10*3/uL — ABNORMAL LOW (ref 0.9–3.3)
nRBC: 0 % (ref 0–0)

## 2016-08-03 ENCOUNTER — Telehealth: Payer: Self-pay | Admitting: *Deleted

## 2016-08-03 NOTE — Telephone Encounter (Signed)
-----   Message from Curt Bears, MD sent at 08/01/2016  6:02 PM EST ----- Please advise with neutropenic precaution ----- Message ----- From: Interface, Lab [facility] Sent: 07/31/2016  11:59 AM To: Curt Bears, MD

## 2016-08-03 NOTE — Telephone Encounter (Signed)
Called pt, instructed pt to take neutropenic precautions. Pt to call office with any concerns.

## 2016-08-07 ENCOUNTER — Other Ambulatory Visit (HOSPITAL_BASED_OUTPATIENT_CLINIC_OR_DEPARTMENT_OTHER): Payer: Medicare Other

## 2016-08-07 ENCOUNTER — Ambulatory Visit: Payer: Medicare Other | Admitting: Internal Medicine

## 2016-08-07 ENCOUNTER — Ambulatory Visit: Payer: Medicare Other

## 2016-08-07 DIAGNOSIS — C8338 Diffuse large B-cell lymphoma, lymph nodes of multiple sites: Secondary | ICD-10-CM | POA: Diagnosis not present

## 2016-08-07 LAB — CBC WITH DIFFERENTIAL/PLATELET
BASO%: 0.6 % (ref 0.0–2.0)
BASOS ABS: 0 10*3/uL (ref 0.0–0.1)
EOS ABS: 0 10*3/uL (ref 0.0–0.5)
EOS%: 1.1 % (ref 0.0–7.0)
HEMATOCRIT: 27 % — AB (ref 38.4–49.9)
HEMOGLOBIN: 8.7 g/dL — AB (ref 13.0–17.1)
LYMPH#: 0.3 10*3/uL — AB (ref 0.9–3.3)
LYMPH%: 7.5 % — ABNORMAL LOW (ref 14.0–49.0)
MCH: 32.4 pg (ref 27.2–33.4)
MCHC: 32.3 g/dL (ref 32.0–36.0)
MCV: 100.2 fL — AB (ref 79.3–98.0)
MONO#: 0.6 10*3/uL (ref 0.1–0.9)
MONO%: 14.3 % — ABNORMAL HIGH (ref 0.0–14.0)
NEUT%: 76.5 % — ABNORMAL HIGH (ref 39.0–75.0)
NEUTROS ABS: 3.2 10*3/uL (ref 1.5–6.5)
Platelets: 91 10*3/uL — ABNORMAL LOW (ref 140–400)
RBC: 2.7 10*6/uL — ABNORMAL LOW (ref 4.20–5.82)
RDW: 17.4 % — AB (ref 11.0–14.6)
WBC: 4.2 10*3/uL (ref 4.0–10.3)

## 2016-08-07 LAB — COMPREHENSIVE METABOLIC PANEL
ALBUMIN: 2.6 g/dL — AB (ref 3.5–5.0)
ALK PHOS: 83 U/L (ref 40–150)
ALT: 15 U/L (ref 0–55)
AST: 22 U/L (ref 5–34)
Anion Gap: 9 mEq/L (ref 3–11)
BILIRUBIN TOTAL: 0.49 mg/dL (ref 0.20–1.20)
BUN: 10.9 mg/dL (ref 7.0–26.0)
CALCIUM: 8.7 mg/dL (ref 8.4–10.4)
CO2: 23 mEq/L (ref 22–29)
Chloride: 104 mEq/L (ref 98–109)
Creatinine: 1 mg/dL (ref 0.7–1.3)
EGFR: 74 mL/min/{1.73_m2} — AB (ref >90)
Glucose: 132 mg/dl (ref 70–140)
POTASSIUM: 4.2 meq/L (ref 3.5–5.1)
Sodium: 136 mEq/L (ref 136–145)
TOTAL PROTEIN: 5.3 g/dL — AB (ref 6.4–8.3)

## 2016-08-07 LAB — URIC ACID: Uric Acid, Serum: 4.9 mg/dl (ref 2.6–7.4)

## 2016-08-07 LAB — LACTATE DEHYDROGENASE: LDH: 210 U/L (ref 125–245)

## 2016-08-14 ENCOUNTER — Other Ambulatory Visit (HOSPITAL_BASED_OUTPATIENT_CLINIC_OR_DEPARTMENT_OTHER): Payer: Medicare Other

## 2016-08-14 ENCOUNTER — Other Ambulatory Visit (HOSPITAL_COMMUNITY): Payer: Self-pay | Admitting: Pharmacist

## 2016-08-14 ENCOUNTER — Telehealth: Payer: Self-pay | Admitting: Internal Medicine

## 2016-08-14 ENCOUNTER — Ambulatory Visit (HOSPITAL_BASED_OUTPATIENT_CLINIC_OR_DEPARTMENT_OTHER): Payer: Medicare Other

## 2016-08-14 ENCOUNTER — Ambulatory Visit (HOSPITAL_BASED_OUTPATIENT_CLINIC_OR_DEPARTMENT_OTHER): Payer: Medicare Other | Admitting: Internal Medicine

## 2016-08-14 ENCOUNTER — Encounter: Payer: Self-pay | Admitting: Internal Medicine

## 2016-08-14 VITALS — BP 114/66 | HR 82 | Temp 98.6°F | Resp 16

## 2016-08-14 VITALS — BP 112/58 | HR 108 | Temp 97.7°F | Resp 18 | Ht 68.0 in | Wt 182.5 lb

## 2016-08-14 DIAGNOSIS — Z5112 Encounter for antineoplastic immunotherapy: Secondary | ICD-10-CM | POA: Diagnosis not present

## 2016-08-14 DIAGNOSIS — Z5111 Encounter for antineoplastic chemotherapy: Secondary | ICD-10-CM | POA: Diagnosis not present

## 2016-08-14 DIAGNOSIS — C8338 Diffuse large B-cell lymphoma, lymph nodes of multiple sites: Secondary | ICD-10-CM

## 2016-08-14 LAB — CBC WITH DIFFERENTIAL/PLATELET
BASO%: 0.7 % (ref 0.0–2.0)
Basophils Absolute: 0 10*3/uL (ref 0.0–0.1)
EOS ABS: 0.1 10*3/uL (ref 0.0–0.5)
EOS%: 1.1 % (ref 0.0–7.0)
HCT: 31.3 % — ABNORMAL LOW (ref 38.4–49.9)
HGB: 10 g/dL — ABNORMAL LOW (ref 13.0–17.1)
LYMPH%: 9.8 % — AB (ref 14.0–49.0)
MCH: 32.5 pg (ref 27.2–33.4)
MCHC: 31.8 g/dL — ABNORMAL LOW (ref 32.0–36.0)
MCV: 102.1 fL — ABNORMAL HIGH (ref 79.3–98.0)
MONO#: 0.8 10*3/uL (ref 0.1–0.9)
MONO%: 15.2 % — AB (ref 0.0–14.0)
NEUT%: 73.2 % (ref 39.0–75.0)
NEUTROS ABS: 3.9 10*3/uL (ref 1.5–6.5)
Platelets: 180 10*3/uL (ref 140–400)
RBC: 3.07 10*6/uL — AB (ref 4.20–5.82)
RDW: 19.1 % — ABNORMAL HIGH (ref 11.0–14.6)
WBC: 5.3 10*3/uL (ref 4.0–10.3)
lymph#: 0.5 10*3/uL — ABNORMAL LOW (ref 0.9–3.3)

## 2016-08-14 LAB — COMPREHENSIVE METABOLIC PANEL
ALT: 23 U/L (ref 0–55)
AST: 28 U/L (ref 5–34)
Albumin: 2.9 g/dL — ABNORMAL LOW (ref 3.5–5.0)
Alkaline Phosphatase: 90 U/L (ref 40–150)
Anion Gap: 8 mEq/L (ref 3–11)
BUN: 9.9 mg/dL (ref 7.0–26.0)
CO2: 25 meq/L (ref 22–29)
Calcium: 9.3 mg/dL (ref 8.4–10.4)
Chloride: 106 mEq/L (ref 98–109)
Creatinine: 0.9 mg/dL (ref 0.7–1.3)
EGFR: 82 mL/min/{1.73_m2} — AB (ref >90)
GLUCOSE: 156 mg/dL — AB (ref 70–140)
POTASSIUM: 3.8 meq/L (ref 3.5–5.1)
SODIUM: 140 meq/L (ref 136–145)
TOTAL PROTEIN: 5.8 g/dL — AB (ref 6.4–8.3)
Total Bilirubin: 0.45 mg/dL (ref 0.20–1.20)

## 2016-08-14 LAB — LACTATE DEHYDROGENASE: LDH: 239 U/L (ref 125–245)

## 2016-08-14 MED ORDER — DEXAMETHASONE SODIUM PHOSPHATE 10 MG/ML IJ SOLN
10.0000 mg | Freq: Once | INTRAMUSCULAR | Status: AC
  Administered 2016-08-14: 10 mg via INTRAVENOUS

## 2016-08-14 MED ORDER — SODIUM CHLORIDE 0.9 % IV SOLN
Freq: Once | INTRAVENOUS | Status: AC
  Administered 2016-08-14: 13:00:00 via INTRAVENOUS

## 2016-08-14 MED ORDER — SODIUM CHLORIDE 0.9% FLUSH
10.0000 mL | INTRAVENOUS | Status: DC | PRN
  Administered 2016-08-14: 10 mL
  Filled 2016-08-14: qty 10

## 2016-08-14 MED ORDER — PALONOSETRON HCL INJECTION 0.25 MG/5ML
0.2500 mg | Freq: Once | INTRAVENOUS | Status: AC
  Administered 2016-08-14: 0.25 mg via INTRAVENOUS

## 2016-08-14 MED ORDER — VINCRISTINE SULFATE CHEMO INJECTION 1 MG/ML
2.0000 mg | Freq: Once | INTRAVENOUS | Status: AC
  Administered 2016-08-14: 2 mg via INTRAVENOUS
  Filled 2016-08-14: qty 2

## 2016-08-14 MED ORDER — ACETAMINOPHEN 325 MG PO TABS
ORAL_TABLET | ORAL | Status: AC
  Filled 2016-08-14: qty 2

## 2016-08-14 MED ORDER — ACETAMINOPHEN 325 MG PO TABS
650.0000 mg | ORAL_TABLET | Freq: Once | ORAL | Status: AC
  Administered 2016-08-14: 650 mg via ORAL

## 2016-08-14 MED ORDER — DIPHENHYDRAMINE HCL 25 MG PO CAPS
50.0000 mg | ORAL_CAPSULE | Freq: Once | ORAL | Status: AC
  Administered 2016-08-14: 50 mg via ORAL

## 2016-08-14 MED ORDER — PALONOSETRON HCL INJECTION 0.25 MG/5ML
INTRAVENOUS | Status: AC
  Filled 2016-08-14: qty 5

## 2016-08-14 MED ORDER — PEGFILGRASTIM 6 MG/0.6ML ~~LOC~~ PSKT
6.0000 mg | PREFILLED_SYRINGE | Freq: Once | SUBCUTANEOUS | Status: AC
  Administered 2016-08-14: 6 mg via SUBCUTANEOUS
  Filled 2016-08-14: qty 0.6

## 2016-08-14 MED ORDER — SODIUM CHLORIDE 0.9 % IV SOLN
750.0000 mg/m2 | Freq: Once | INTRAVENOUS | Status: AC
  Administered 2016-08-14: 1560 mg via INTRAVENOUS
  Filled 2016-08-14: qty 78

## 2016-08-14 MED ORDER — RITUXIMAB CHEMO INJECTION 500 MG/50ML
375.0000 mg/m2 | Freq: Once | INTRAVENOUS | Status: AC
  Administered 2016-08-14: 800 mg via INTRAVENOUS
  Filled 2016-08-14: qty 50

## 2016-08-14 MED ORDER — DIPHENHYDRAMINE HCL 25 MG PO CAPS
ORAL_CAPSULE | ORAL | Status: AC
  Filled 2016-08-14: qty 2

## 2016-08-14 MED ORDER — HEPARIN SOD (PORK) LOCK FLUSH 100 UNIT/ML IV SOLN
500.0000 [IU] | Freq: Once | INTRAVENOUS | Status: AC | PRN
  Administered 2016-08-14: 500 [IU]
  Filled 2016-08-14: qty 5

## 2016-08-14 MED ORDER — DEXAMETHASONE SODIUM PHOSPHATE 10 MG/ML IJ SOLN
INTRAMUSCULAR | Status: AC
  Filled 2016-08-14: qty 1

## 2016-08-14 MED ORDER — DOXORUBICIN HCL CHEMO IV INJECTION 2 MG/ML
50.0000 mg/m2 | Freq: Once | INTRAVENOUS | Status: AC
  Administered 2016-08-14: 104 mg via INTRAVENOUS
  Filled 2016-08-14: qty 52

## 2016-08-14 NOTE — Patient Instructions (Signed)
[facility] Discharge Instructions for Patients Receiving Chemotherapy  Today you received the following chemotherapy agents Adriamycin, Vincristine, Cytoxan and Rituxan   To help prevent nausea and vomiting after your treatment, we encourage you to take your nausea medication as directed. No Zofran for 3 days. Take Compazine instead.   If you develop nausea and vomiting that is not controlled by your nausea medication, call the clinic.   BELOW ARE SYMPTOMS THAT SHOULD BE REPORTED IMMEDIATELY:  *FEVER GREATER THAN 100.5 F  *CHILLS WITH OR WITHOUT FEVER  NAUSEA AND VOMITING THAT IS NOT CONTROLLED WITH YOUR NAUSEA MEDICATION  *UNUSUAL SHORTNESS OF BREATH  *UNUSUAL BRUISING OR BLEEDING  TENDERNESS IN MOUTH AND THROAT WITH OR WITHOUT PRESENCE OF ULCERS  *URINARY PROBLEMS  *BOWEL PROBLEMS  UNUSUAL RASH Items with * indicate a potential emergency and should be followed up as soon as possible.  Feel free to call the clinic you have any questions or concerns. The clinic phone number is [phone].  Please show the [facility] at check-in to the Emergency Department and triage nurse.

## 2016-08-14 NOTE — Progress Notes (Signed)
[facility] Telephone:([phone]   Fax:(336) [place], MD [place] [address]  DIAGNOSIS: Stage II large B-cell non-Hodgkin lymphoma diagnosed in August 2017.  PRIOR THERAPY: None  CURRENT THERAPY: Systemic chemotherapy with CHOP/Rituxan every 3 weeks with Neulasta support. Status post 5 cycles. First dose was given 04/24/2016.  INTERVAL HISTORY: Seth Gilbert 80 y.o. male returns to the clinic today for follow-up visit accompanied by his wife. The patient is currently undergoing systemic chemotherapy with CHOP/Rituxan. He tolerated the last cycle of his treatment well with no significant complaints except for increasing fatigue. He was have pancytopenia 1 week off after his treatment but he recovers well after that. He denied having any bleeding, bruises or ecchymosis. He denied having any current fever or chills. He has no nausea, vomiting, diarrhea or constipation. He denied having any significant chest pain, shortness of breath, cough or hemoptysis. He is here today for evaluation before starting cycle #6.  MEDICAL HISTORY: Past Medical History:  Diagnosis Date  . Cancer ([place])    NHL  . Diverticulitis   . GERD (gastroesophageal reflux disease)    Heartburn at times  . Heart murmur    "comes and goes" onset 4th grade - last time anyone heard it was 01/2014  . History of hiatal hernia   . History of kidney stones   . HOH (hard of hearing)   . PONV (postoperative nausea and vomiting)   . Rosacea   . Wears glasses     ALLERGIES:  is allergic to no known allergies.  MEDICATIONS:  Current Outpatient Prescriptions  Medication Sig Dispense Refill  . acetaminophen (TYLENOL) 500 MG tablet Take 1,000 mg by mouth every 6 (six) hours as needed for moderate pain.    Marland Kitchen allopurinol (ZYLOPRIM) 100 MG tablet Take 1 tablet (100 mg total) by mouth 2 (two) times daily. 60 tablet 3  . famotidine (PEPCID) 40 MG  tablet Take 40 mg by mouth daily as needed for heartburn.     Marland Kitchen HYDROcodone-acetaminophen (NORCO/VICODIN) 5-325 MG tablet Take 1 tablet by mouth every 6 (six) hours as needed for moderate pain. (Patient not taking: Reported on 06/26/2016) 15 tablet 0  . lidocaine-prilocaine (EMLA) cream Apply 1 application topically as needed. 30 g 0  . metroNIDAZOLE (METROGEL) 0.75 % gel Apply 1 application topically daily.     . Multiple Vitamins-Minerals (CENTRUM SILVER PO) Take 1 tablet by mouth daily.     . prochlorperazine (COMPAZINE) 10 MG tablet Take 1 tablet (10 mg total) by mouth every 6 (six) hours as needed for nausea or vomiting. (Patient not taking: Reported on 06/26/2016) 30 tablet 0  . senna (SENOKOT) 8.6 MG tablet Take 1 tablet by mouth daily as needed for constipation.    . vitamin E 400 UNIT capsule Take 400 Units by mouth daily.     No current facility-administered medications for this visit.     SURGICAL HISTORY:  Past Surgical History:  Procedure Laterality Date  . APPENDECTOMY    . COLONOSCOPY    . HEMORROIDECTOMY     inflammed anal gland removed  . INGUINAL HERNIA REPAIR Left 02/11/2014   Procedure: LEFT INGUINAL HERNIA REPAIR WITH MESH ;  Surgeon: Joyice Faster. Cornett, MD;  Location: [facility];  Service: General;  Laterality: Left;  . INSERTION OF MESH N/A 02/11/2014   Procedure: INSERTION OF MESH;  Surgeon: Joyice Faster. Cornett, MD;  Location:  [facility];  Service: General;  Laterality: N/A;  . PORTACATH PLACEMENT Left 04/10/2016   Procedure: INSERTION PORT-A-CATH into LEFT Subclavian Vein with guidance from ultrasound and flouroscopy;  Surgeon: Grace Isaac, MD;  Location: [facility];  Service: Thoracic;  Laterality: Left;  . TONSILLECTOMY    . VIDEO BRONCHOSCOPY WITH ENDOBRONCHIAL ULTRASOUND N/A 03/30/2016   Procedure: VIDEO BRONCHOSCOPY WITH ENDOBRONCHIAL ULTRASOUND,WITH TRANSBRONCHIAL BIOPSY;  Surgeon: Grace Isaac, MD;  Location: [facility];  Service:  Thoracic;  Laterality: N/A;    REVIEW OF SYSTEMS:  A comprehensive review of systems was negative except for: Constitutional: positive for fatigue   PHYSICAL EXAMINATION: General appearance: alert, cooperative, fatigued and no distress Head: Normocephalic, without obvious abnormality, atraumatic Neck: no adenopathy, no carotid bruit, supple, symmetrical, trachea midline and thyroid not enlarged, symmetric, no tenderness/mass/nodules Lymph nodes: Cervical, supraclavicular, and axillary nodes normal. Resp: clear to auscultation bilaterally Back: symmetric, no curvature. ROM normal. No CVA tenderness. Cardio: regular rate and rhythm, S1, S2 normal, no murmur, click, rub or gallop GI: soft, non-tender; bowel sounds normal; no masses,  no organomegaly Extremities: extremities normal, atraumatic, no cyanosis or edema  ECOG PERFORMANCE STATUS: 1 - Symptomatic but completely ambulatory  Blood pressure (!) 112/58, pulse (!) 108, temperature 97.7 F (36.5 C), temperature source Oral, resp. rate 18, height 5\' 8"  (1.727 m), weight 182 lb 8 oz (82.8 kg), SpO2 100 %.  LABORATORY DATA: Lab Results  Component Value Date   WBC 5.3 08/14/2016   HGB 10.0 (L) 08/14/2016   HCT 31.3 (L) 08/14/2016   MCV 102.1 (H) 08/14/2016   PLT 180 08/14/2016      Chemistry      Component Value Date/Time   NA 140 08/14/2016 1059   K 3.8 08/14/2016 1059   CL 109 06/18/2016 0319   CO2 25 08/14/2016 1059   BUN 9.9 08/14/2016 1059   CREATININE 0.9 08/14/2016 1059      Component Value Date/Time   CALCIUM 9.3 08/14/2016 1059   ALKPHOS 90 08/14/2016 1059   AST 28 08/14/2016 1059   ALT 23 08/14/2016 1059   BILITOT 0.45 08/14/2016 1059       RADIOGRAPHIC STUDIES: No results found.  ASSESSMENT AND PLAN: This is a very pleasant 80 years old white male with a stage II large B-cell non-Hodgkin lymphoma currently undergoing systemic chemotherapy with CHOP/Rituxan status post 5 cycles. The patient is treating the  treatment well except for fatigue. I recommended for him to proceed with cycle #6 today as a scheduled. I will see him back for follow-up visit in one month after repeating PET scan for restaging of his disease. He was advised to call immediately if he has any concerning symptoms in the interval. The patient voices understanding of current disease status and treatment options and is in agreement with the current care plan.  All questions were answered. The patient knows to call the clinic with any problems, questions or concerns. We can certainly see the patient much sooner if necessary.  I spent 10 minutes counseling the patient face to face. The total time spent in the appointment was 15 minutes.  Disclaimer: This note was dictated with voice recognition software. Similar sounding words can inadvertently be transcribed and may not be corrected upon review.

## 2016-08-14 NOTE — Telephone Encounter (Signed)
Appointments scheduled per 08/14/16 los. A copy of the appointment schedule and AVS report was given to the patient, per 08/14/16 los. °

## 2016-08-22 ENCOUNTER — Other Ambulatory Visit: Payer: Self-pay | Admitting: Emergency Medicine

## 2016-08-22 ENCOUNTER — Other Ambulatory Visit (HOSPITAL_BASED_OUTPATIENT_CLINIC_OR_DEPARTMENT_OTHER): Payer: Medicare Other

## 2016-08-22 ENCOUNTER — Telehealth: Payer: Self-pay | Admitting: Emergency Medicine

## 2016-08-22 DIAGNOSIS — C8338 Diffuse large B-cell lymphoma, lymph nodes of multiple sites: Secondary | ICD-10-CM | POA: Diagnosis not present

## 2016-08-22 DIAGNOSIS — Z5111 Encounter for antineoplastic chemotherapy: Secondary | ICD-10-CM

## 2016-08-22 LAB — COMPREHENSIVE METABOLIC PANEL
ALT: 16 U/L (ref 0–55)
ANION GAP: 8 meq/L (ref 3–11)
AST: 14 U/L (ref 5–34)
Albumin: 2.8 g/dL — ABNORMAL LOW (ref 3.5–5.0)
Alkaline Phosphatase: 73 U/L (ref 40–150)
BILIRUBIN TOTAL: 1.16 mg/dL (ref 0.20–1.20)
BUN: 21.9 mg/dL (ref 7.0–26.0)
CALCIUM: 8.4 mg/dL (ref 8.4–10.4)
CHLORIDE: 102 meq/L (ref 98–109)
CO2: 22 mEq/L (ref 22–29)
CREATININE: 1 mg/dL (ref 0.7–1.3)
EGFR: 68 mL/min/{1.73_m2} — AB (ref >90)
Glucose: 114 mg/dl (ref 70–140)
Potassium: 4.5 mEq/L (ref 3.5–5.1)
Sodium: 132 mEq/L — ABNORMAL LOW (ref 136–145)
Total Protein: 4.8 g/dL — ABNORMAL LOW (ref 6.4–8.3)

## 2016-08-22 LAB — CBC WITH DIFFERENTIAL/PLATELET
BASO%: 0.9 % (ref 0.0–2.0)
BASOS ABS: 0 10*3/uL (ref 0.0–0.1)
EOS ABS: 0 10*3/uL (ref 0.0–0.5)
EOS%: 0.9 % (ref 0.0–7.0)
HCT: 24.4 % — ABNORMAL LOW (ref 38.4–49.9)
HGB: 8 g/dL — ABNORMAL LOW (ref 13.0–17.1)
LYMPH#: 0.1 10*3/uL — AB (ref 0.9–3.3)
LYMPH%: 25.5 % (ref 14.0–49.0)
MCH: 33.1 pg (ref 27.2–33.4)
MCHC: 32.9 g/dL (ref 32.0–36.0)
MCV: 100.6 fL — AB (ref 79.3–98.0)
MONO#: 0.2 10*3/uL (ref 0.1–0.9)
MONO%: 44.6 % — ABNORMAL HIGH (ref 0.0–14.0)
NEUT#: 0.1 10*3/uL — CL (ref 1.5–6.5)
NEUT%: 28.1 % — AB (ref 39.0–75.0)
PLATELETS: 18 10*3/uL — AB (ref 140–400)
RBC: 2.42 10*6/uL — AB (ref 4.20–5.82)
RDW: 17.9 % — ABNORMAL HIGH (ref 11.0–14.6)
WBC: 0.4 10*3/uL — CL (ref 4.0–10.3)

## 2016-08-22 MED ORDER — CIPROFLOXACIN HCL 500 MG PO TABS: 500.0000 mg | ORAL_TABLET | Freq: Two times a day (BID) | ORAL | 0 refills | Status: DC

## 2016-08-22 NOTE — Telephone Encounter (Signed)
Spoke with patient's wife; instructed her that a prescription for Cipro was called in for 5 days.   Patient has been more fatigued than normal and some shortness of breath; denies fever; states appetite is well and is drinking fluids well. Denies nausea or vomiting.   Instructed patient's wife to call for any changes or concerns. She verbalized understanding.

## 2016-08-22 NOTE — Telephone Encounter (Signed)
Patient's spouse called with concerns of patient having a temp of 100.8. Per Dr Julien Nordmann patient instructed to go to the ED for evaluation. Will follow up in am with patient's spouse.

## 2016-08-23 NOTE — Telephone Encounter (Addendum)
Called to check pt status wife answered and advised " his tempeture is normal, BP is still a little low 95/52 but her runs low. He's up and eating breakfast, I think most of his problem is drinking." Wife verbalized he's up and moving around. Wife denies having any concerns, will call if pt status changes.

## 2016-08-29 ENCOUNTER — Other Ambulatory Visit (HOSPITAL_BASED_OUTPATIENT_CLINIC_OR_DEPARTMENT_OTHER): Payer: Medicare Other

## 2016-08-29 DIAGNOSIS — C8338 Diffuse large B-cell lymphoma, lymph nodes of multiple sites: Secondary | ICD-10-CM

## 2016-08-29 LAB — CBC WITH DIFFERENTIAL/PLATELET
BASO%: 0.2 % (ref 0.0–2.0)
Basophils Absolute: 0 10*3/uL (ref 0.0–0.1)
EOS ABS: 0 10*3/uL (ref 0.0–0.5)
EOS%: 0.6 % (ref 0.0–7.0)
HEMATOCRIT: 26 % — AB (ref 38.4–49.9)
HGB: 8.6 g/dL — ABNORMAL LOW (ref 13.0–17.1)
LYMPH#: 0.8 10*3/uL — AB (ref 0.9–3.3)
LYMPH%: 15.4 % (ref 14.0–49.0)
MCH: 33.3 pg (ref 27.2–33.4)
MCHC: 33.1 g/dL (ref 32.0–36.0)
MCV: 100.8 fL — AB (ref 79.3–98.0)
MONO#: 0.5 10*3/uL (ref 0.1–0.9)
MONO%: 10.8 % (ref 0.0–14.0)
NEUT%: 73 % (ref 39.0–75.0)
NEUTROS ABS: 3.6 10*3/uL (ref 1.5–6.5)
PLATELETS: 72 10*3/uL — AB (ref 140–400)
RBC: 2.58 10*6/uL — ABNORMAL LOW (ref 4.20–5.82)
RDW: 18.1 % — ABNORMAL HIGH (ref 11.0–14.6)
WBC: 5 10*3/uL (ref 4.0–10.3)

## 2016-08-29 LAB — COMPREHENSIVE METABOLIC PANEL
ALT: 13 U/L (ref 0–55)
ANION GAP: 10 meq/L (ref 3–11)
AST: 20 U/L (ref 5–34)
Albumin: 2.9 g/dL — ABNORMAL LOW (ref 3.5–5.0)
Alkaline Phosphatase: 91 U/L (ref 40–150)
BILIRUBIN TOTAL: 0.59 mg/dL (ref 0.20–1.20)
BUN: 9.1 mg/dL (ref 7.0–26.0)
CALCIUM: 8.9 mg/dL (ref 8.4–10.4)
CO2: 23 mEq/L (ref 22–29)
CREATININE: 0.8 mg/dL (ref 0.7–1.3)
Chloride: 104 mEq/L (ref 98–109)
EGFR: 84 mL/min/{1.73_m2} — ABNORMAL LOW (ref >90)
Glucose: 129 mg/dl (ref 70–140)
Potassium: 3.7 mEq/L (ref 3.5–5.1)
Sodium: 137 mEq/L (ref 136–145)
TOTAL PROTEIN: 5.3 g/dL — AB (ref 6.4–8.3)

## 2016-08-29 LAB — LACTATE DEHYDROGENASE: LDH: 214 U/L (ref 125–245)

## 2016-08-31 ENCOUNTER — Telehealth: Payer: Self-pay | Admitting: Medical Oncology

## 2016-08-31 NOTE — Telephone Encounter (Signed)
Cancel 1/9 infusion per his discussion with [facility]. DOne

## 2016-09-04 ENCOUNTER — Ambulatory Visit: Payer: Medicare Other

## 2016-09-04 ENCOUNTER — Other Ambulatory Visit (HOSPITAL_BASED_OUTPATIENT_CLINIC_OR_DEPARTMENT_OTHER): Payer: Medicare Other

## 2016-09-04 DIAGNOSIS — C8338 Diffuse large B-cell lymphoma, lymph nodes of multiple sites: Secondary | ICD-10-CM | POA: Diagnosis not present

## 2016-09-04 LAB — COMPREHENSIVE METABOLIC PANEL
ALBUMIN: 2.8 g/dL — AB (ref 3.5–5.0)
ALK PHOS: 80 U/L (ref 40–150)
ALT: 14 U/L (ref 0–55)
AST: 21 U/L (ref 5–34)
Anion Gap: 9 mEq/L (ref 3–11)
BUN: 10.9 mg/dL (ref 7.0–26.0)
CO2: 26 mEq/L (ref 22–29)
CREATININE: 0.8 mg/dL (ref 0.7–1.3)
Calcium: 9.1 mg/dL (ref 8.4–10.4)
Chloride: 105 mEq/L (ref 98–109)
EGFR: 86 mL/min/{1.73_m2} — ABNORMAL LOW (ref >90)
GLUCOSE: 106 mg/dL (ref 70–140)
Potassium: 3.9 mEq/L (ref 3.5–5.1)
SODIUM: 140 meq/L (ref 136–145)
TOTAL PROTEIN: 5.1 g/dL — AB (ref 6.4–8.3)
Total Bilirubin: 0.51 mg/dL (ref 0.20–1.20)

## 2016-09-04 LAB — CBC WITH DIFFERENTIAL/PLATELET
BASO%: 0.7 % (ref 0.0–2.0)
Basophils Absolute: 0 10*3/uL (ref 0.0–0.1)
EOS ABS: 0 10*3/uL (ref 0.0–0.5)
EOS%: 0.7 % (ref 0.0–7.0)
HEMATOCRIT: 27.6 % — AB (ref 38.4–49.9)
HEMOGLOBIN: 8.7 g/dL — AB (ref 13.0–17.1)
LYMPH#: 0.3 10*3/uL — AB (ref 0.9–3.3)
LYMPH%: 7.7 % — ABNORMAL LOW (ref 14.0–49.0)
MCH: 32.3 pg (ref 27.2–33.4)
MCHC: 31.5 g/dL — ABNORMAL LOW (ref 32.0–36.0)
MCV: 102.6 fL — AB (ref 79.3–98.0)
MONO#: 1.1 10*3/uL — ABNORMAL HIGH (ref 0.1–0.9)
MONO%: 25.7 % — ABNORMAL HIGH (ref 0.0–14.0)
NEUT#: 2.9 10*3/uL (ref 1.5–6.5)
NEUT%: 65.2 % (ref 39.0–75.0)
Platelets: 112 10*3/uL — ABNORMAL LOW (ref 140–400)
RBC: 2.69 10*6/uL — AB (ref 4.20–5.82)
RDW: 19.1 % — AB (ref 11.0–14.6)
WBC: 4.4 10*3/uL (ref 4.0–10.3)

## 2016-09-04 LAB — LACTATE DEHYDROGENASE: LDH: 209 U/L (ref 125–245)

## 2016-09-10 ENCOUNTER — Ambulatory Visit (HOSPITAL_COMMUNITY)
Admission: RE | Admit: 2016-09-10 | Discharge: 2016-09-10 | Disposition: A | Discharge status: Home / Self Care | Payer: Medicare Other | Source: Ambulatory Visit | Attending: Internal Medicine | Admitting: Internal Medicine

## 2016-09-10 DIAGNOSIS — Z5111 Encounter for antineoplastic chemotherapy: Secondary | ICD-10-CM

## 2016-09-10 DIAGNOSIS — C8338 Diffuse large B-cell lymphoma, lymph nodes of multiple sites: Secondary | ICD-10-CM

## 2016-09-10 DIAGNOSIS — Z9889 Other specified postprocedural states: Secondary | ICD-10-CM | POA: Diagnosis not present

## 2016-09-10 DIAGNOSIS — C8330 Diffuse large B-cell lymphoma, unspecified site: Secondary | ICD-10-CM | POA: Diagnosis not present

## 2016-09-10 LAB — GLUCOSE, CAPILLARY: GLUCOSE-CAPILLARY: 119 mg/dL — AB (ref 65–99)

## 2016-09-10 MED ORDER — FLUDEOXYGLUCOSE F - 18 (FDG) INJECTION
9.3000 | Freq: Once | INTRAVENOUS | Status: AC | PRN
  Administered 2016-09-10: 9.3 via INTRAVENOUS

## 2016-09-11 ENCOUNTER — Encounter: Payer: Self-pay | Admitting: Internal Medicine

## 2016-09-11 ENCOUNTER — Other Ambulatory Visit (HOSPITAL_BASED_OUTPATIENT_CLINIC_OR_DEPARTMENT_OTHER): Payer: Medicare Other

## 2016-09-11 ENCOUNTER — Ambulatory Visit (HOSPITAL_BASED_OUTPATIENT_CLINIC_OR_DEPARTMENT_OTHER): Payer: Medicare Other | Admitting: Internal Medicine

## 2016-09-11 VITALS — BP 113/44 | HR 94 | Temp 97.5°F | Resp 17 | Ht 68.0 in | Wt 176.0 lb

## 2016-09-11 DIAGNOSIS — C8338 Diffuse large B-cell lymphoma, lymph nodes of multiple sites: Secondary | ICD-10-CM | POA: Diagnosis not present

## 2016-09-11 DIAGNOSIS — G620 Drug-induced polyneuropathy: Secondary | ICD-10-CM | POA: Diagnosis not present

## 2016-09-11 DIAGNOSIS — D6481 Anemia due to antineoplastic chemotherapy: Secondary | ICD-10-CM

## 2016-09-11 DIAGNOSIS — R530 Neoplastic (malignant) related fatigue: Secondary | ICD-10-CM

## 2016-09-11 DIAGNOSIS — D61810 Antineoplastic chemotherapy induced pancytopenia: Secondary | ICD-10-CM | POA: Diagnosis not present

## 2016-09-11 DIAGNOSIS — G6289 Other specified polyneuropathies: Secondary | ICD-10-CM

## 2016-09-11 DIAGNOSIS — Z5111 Encounter for antineoplastic chemotherapy: Secondary | ICD-10-CM

## 2016-09-11 DIAGNOSIS — G629 Polyneuropathy, unspecified: Secondary | ICD-10-CM

## 2016-09-11 HISTORY — DX: Polyneuropathy, unspecified: G62.9

## 2016-09-11 LAB — COMPREHENSIVE METABOLIC PANEL
ALBUMIN: 3.2 g/dL — AB (ref 3.5–5.0)
ALK PHOS: 84 U/L (ref 40–150)
ALT: 16 U/L (ref 0–55)
ANION GAP: 8 meq/L (ref 3–11)
AST: 23 U/L (ref 5–34)
BILIRUBIN TOTAL: 0.52 mg/dL (ref 0.20–1.20)
BUN: 15.2 mg/dL (ref 7.0–26.0)
CO2: 27 mEq/L (ref 22–29)
Calcium: 9.5 mg/dL (ref 8.4–10.4)
Chloride: 106 mEq/L (ref 98–109)
Creatinine: 0.8 mg/dL (ref 0.7–1.3)
EGFR: 84 mL/min/{1.73_m2} — AB (ref >90)
Glucose: 100 mg/dl (ref 70–140)
POTASSIUM: 4.1 meq/L (ref 3.5–5.1)
Sodium: 142 mEq/L (ref 136–145)
TOTAL PROTEIN: 5.8 g/dL — AB (ref 6.4–8.3)

## 2016-09-11 LAB — CBC WITH DIFFERENTIAL/PLATELET
BASO%: 0.4 % (ref 0.0–2.0)
BASOS ABS: 0 10*3/uL (ref 0.0–0.1)
EOS ABS: 0.1 10*3/uL (ref 0.0–0.5)
EOS%: 1.7 % (ref 0.0–7.0)
HCT: 32.4 % — ABNORMAL LOW (ref 38.4–49.9)
HEMOGLOBIN: 10.3 g/dL — AB (ref 13.0–17.1)
LYMPH%: 22.9 % (ref 14.0–49.0)
MCH: 33.1 pg (ref 27.2–33.4)
MCHC: 31.8 g/dL — ABNORMAL LOW (ref 32.0–36.0)
MCV: 104.2 fL — AB (ref 79.3–98.0)
MONO#: 0.5 10*3/uL (ref 0.1–0.9)
MONO%: 10.5 % (ref 0.0–14.0)
NEUT%: 64.5 % (ref 39.0–75.0)
NEUTROS ABS: 3.3 10*3/uL (ref 1.5–6.5)
PLATELETS: 143 10*3/uL (ref 140–400)
RBC: 3.11 10*6/uL — ABNORMAL LOW (ref 4.20–5.82)
RDW: 18.8 % — ABNORMAL HIGH (ref 11.0–14.6)
WBC: 5.2 10*3/uL (ref 4.0–10.3)
lymph#: 1.2 10*3/uL (ref 0.9–3.3)

## 2016-09-11 NOTE — Progress Notes (Signed)
[facility] [phone]   Fax:([phone]) [place], [place] [address]  DIAGNOSIS: Stage II large B-cell non-Hodgkin lymphoma diagnosed in August 2017.  PRIOR THERAPY: Systemic chemotherapy with CHOP/Rituxan every 3 weeks with Neulasta support, status post 6 cycles.  Last dose was given on 08/14/2016.  CURRENT THERAPY:None.  INTERVAL HISTORY: Seth Gilbert 81 y.o. male returns to the clinic today for follow-up visit accompanied by his wife. The patient completed 6 cycles of systemic chemotherapy with CHOP/Rituxan and tolerated his treatment well except for chemotherapy-induced pancytopenia. He continues to complain of generalized weakness more in the upper and lower extremities. He also has some evidence of peripheral neuropathy but no significant numbness but just weakness. He denied having any chest pain, shortness of breath, cough or hemoptysis. He has no fever or chills. He has no nausea, vomiting, diarrhea or constipation. He lost around 7 pounds since his last visit. He had repeat PET scan performed recently and he is here for evaluation and discussion of his scan results and recommendation regarding his condition.  MEDICAL HISTORY: Past Medical History:  Diagnosis Date  . Cancer ([facility])    NHL  . Diverticulitis   . GERD (gastroesophageal reflux disease)    Heartburn at times  . Heart murmur    "comes and goes" onset 4th grade - last time anyone heard it was 01/2014  . History of hiatal hernia   . History of kidney stones   . HOH (hard of hearing)   . PONV (postoperative nausea and vomiting)   . Rosacea   . Wears glasses     ALLERGIES:  is allergic to no known allergies.  MEDICATIONS:  Current Outpatient Prescriptions  Medication Sig Dispense Refill  . acetaminophen (TYLENOL) 500 MG tablet Take 1,000 mg by mouth every 6 (six) hours as needed for moderate pain.    . famotidine (PEPCID)  40 MG tablet Take 40 mg by mouth daily as needed for heartburn.     . lidocaine-prilocaine (EMLA) cream Apply 1 application topically as needed. 30 g 0  . metroNIDAZOLE (METROGEL) 0.75 % gel Apply 1 application topically daily.     . Multiple Vitamins-Minerals (CENTRUM SILVER PO) Take 1 tablet by mouth daily.     Marland Kitchen senna (SENOKOT) 8.6 MG tablet Take 1 tablet by mouth daily as needed for constipation.    . vitamin E 400 UNIT capsule Take 400 Units by mouth daily.    Marland Kitchen HYDROcodone-acetaminophen (NORCO/VICODIN) 5-325 MG tablet Take 1 tablet by mouth every 6 (six) hours as needed for moderate pain. (Patient not taking: Reported on 09/11/2016) 15 tablet 0  . prochlorperazine (COMPAZINE) 10 MG tablet Take 1 tablet (10 mg total) by mouth every 6 (six) hours as needed for nausea or vomiting. (Patient not taking: Reported on 09/11/2016) 30 tablet 0   No current facility-administered medications for this visit.     SURGICAL HISTORY:  Past Surgical History:  Procedure Laterality Date  . APPENDECTOMY    . COLONOSCOPY    . HEMORROIDECTOMY     inflammed anal gland removed  . INGUINAL HERNIA REPAIR Left 02/11/2014   Procedure: LEFT INGUINAL HERNIA REPAIR WITH MESH ;  Surgeon: Joyice Faster. Cornett, MD;  Location: [facility];  Service: General;  Laterality: Left;  . INSERTION OF MESH N/A 02/11/2014   Procedure: INSERTION OF MESH;  Surgeon: Joyice Faster. Cornett, MD;  Location: [facility];  Service: General;  Laterality: N/A;  . PORTACATH PLACEMENT Left 04/10/2016   Procedure: INSERTION PORT-A-CATH into LEFT Subclavian Vein with guidance from ultrasound and flouroscopy;  Surgeon: Grace Isaac, MD;  Location: [facility];  Service: Thoracic;  Laterality: Left;  . TONSILLECTOMY    . VIDEO BRONCHOSCOPY WITH ENDOBRONCHIAL ULTRASOUND N/A 03/30/2016   Procedure: VIDEO BRONCHOSCOPY WITH ENDOBRONCHIAL ULTRASOUND,WITH TRANSBRONCHIAL BIOPSY;  Surgeon: Grace Isaac, MD;  Location: [facility];  Service:  Thoracic;  Laterality: N/A;    REVIEW OF SYSTEMS:  Constitutional: positive for fatigue and weight loss Eyes: negative Ears, nose, mouth, throat, and face: negative Respiratory: negative Cardiovascular: negative Gastrointestinal: negative Genitourinary:negative Integument/breast: negative Hematologic/lymphatic: negative Musculoskeletal:positive for muscle weakness Neurological: positive for paresthesia Behavioral/Psych: negative Endocrine: negative Allergic/Immunologic: negative   PHYSICAL EXAMINATION: General appearance: alert, cooperative, fatigued and no distress Head: Normocephalic, without obvious abnormality, atraumatic Neck: no adenopathy, no JVD, supple, symmetrical, trachea midline and thyroid not enlarged, symmetric, no tenderness/mass/nodules Lymph nodes: Cervical, supraclavicular, and axillary nodes normal. Resp: clear to auscultation bilaterally Back: symmetric, no curvature. ROM normal. No CVA tenderness. Cardio: regular rate and rhythm, S1, S2 normal, no murmur, click, rub or gallop GI: soft, non-tender; bowel sounds normal; no masses,  no organomegaly Extremities: extremities normal, atraumatic, no cyanosis or edema Neurologic: Alert and oriented X 3, normal strength and tone. Normal symmetric reflexes. Normal coordination and gait  ECOG PERFORMANCE STATUS: 1 - Symptomatic but completely ambulatory  Blood pressure (!) 113/44, pulse 94, temperature 97.5 F (36.4 C), temperature source Oral, resp. rate 17, height 5\' 8"  (1.727 m), weight 176 lb (79.8 kg), SpO2 100 %.  LABORATORY DATA: Lab Results  Component Value Date   WBC 5.2 09/11/2016   HGB 10.3 (L) 09/11/2016   HCT 32.4 (L) 09/11/2016   MCV 104.2 (H) 09/11/2016   PLT 143 09/11/2016      Chemistry      Component Value Date/Time   NA 140 09/04/2016 1127   K 3.9 09/04/2016 1127   CL 109 06/18/2016 0319   CO2 26 09/04/2016 1127   BUN 10.9 09/04/2016 1127   CREATININE 0.8 09/04/2016 1127        Component Value Date/Time   CALCIUM 9.1 09/04/2016 1127   ALKPHOS 80 09/04/2016 1127   AST 21 09/04/2016 1127   ALT 14 09/04/2016 1127   BILITOT 0.51 09/04/2016 1127       RADIOGRAPHIC STUDIES: Nm Pet Image Restag (ps) Skull Base To Thigh  Result Date: 09/10/2016 CLINICAL DATA:  Subsequent treatment strategy for diffuse large B-cell lymphoma. EXAM: NUCLEAR MEDICINE PET SKULL BASE TO THIGH TECHNIQUE: 9.3 mCi F-18 FDG was injected intravenously. Full-ring PET imaging was performed from the skull base to thigh after the radiotracer. CT data was obtained and used for attenuation correction and anatomic localization. FASTING BLOOD GLUCOSE:  Value: 119 mg/dl COMPARISON:  CT chest abdomen pelvis dated 06/22/2016. PET-CT dated 03/20/2016. FINDINGS: NECK No hypermetabolic lymph nodes in the neck. CHEST No hypermetabolic mediastinal or hilar nodes. No suspicious pulmonary nodules on the CT scan. Evaluation of the lung parenchyma is constrained by respiratory motion. Mild subpleural reticulation in the bilateral lower lobes. Overall appearance is improved from recent CT chest. ABDOMEN/PELVIS No abnormal hypermetabolic activity within the liver, pancreas, adrenal glands, or spleen. Mild soft tissue stranding in the left inguinal region with associated hypermetabolism, max SUV 5.0, previously 4.1. However, there is no discrete lymph node in this location on CT (series 4/image 169), raising the possibility of postsurgical change  and/or inflammation at the underlying etiology. Otherwise, no hypermetabolic lymph nodes in the abdomen or pelvis. SKELETON No focal hypermetabolic activity to suggest skeletal metastasis. IMPRESSION: Mild residual hypermetabolism in the left inguinal region, favored to reflect postsurgical change or infection/inflammation. No findings suspicious for active lymphomatous involvement. Electronically Signed   By: Julian Hy M.D.   On: 09/10/2016 14:28    ASSESSMENT AND PLAN: This is a  very pleasant 81 years old white male with stage II large B-cell non-Hodgkin lymphoma diagnosed in August 2017. He is status post 6 cycles of systemic chemotherapy with CHOP/Rituxan. He tolerated his treatment fairly well except for chemotherapy-induced pancytopenia and weakness. His count has significantly improved except for mild anemia. The patient had repeat PET scan performed recently. I personally and independently reviewed the scan images and discuss the results with the patient and his wife and showed them the images. His PET scan showed almost complete response with no findings suspicious for active lymphoma. I recommended for the patient to continue on observation with repeat CBC, comprehensive metabolic panel, LDH as well as CT scan of the chest, abdomen and pelvis in 6 months. For the generalized weakness, I strongly recommended for the patient to start exercising slowly but increased with time. For the peripheral neuropathy, I discussed with the patient treatment with gabapentin but he would like to hold on this for now. We may consider it in the future if no improvement in his condition. For the chemotherapy-induced anemia, we will continue to monitor for now. I will also arrange for the patient to have Port-A-Cath flush every 8 weeks. He was advised to call immediately if she has any concerning symptoms in the interval. The patient voices understanding of current disease status and treatment options and is in agreement with the current care plan.  All questions were answered. The patient knows to call the clinic with any problems, questions or concerns. We can certainly see the patient much sooner if necessary.  Disclaimer: This note was dictated with voice recognition software. Similar sounding words can inadvertently be transcribed and may not be corrected upon review.

## 2016-09-16 ENCOUNTER — Telehealth: Payer: Self-pay | Admitting: Internal Medicine

## 2016-09-16 NOTE — Telephone Encounter (Signed)
Lvm advising lab/flush every 2 months to begin on 3/13 @ 1.30pm. Also mailed calendar through July. Radiology will call to sched scans.

## 2016-10-15 ENCOUNTER — Telehealth: Payer: Self-pay | Admitting: *Deleted

## 2016-10-15 DIAGNOSIS — C8200 Follicular lymphoma grade I, unspecified site: Secondary | ICD-10-CM

## 2016-10-15 NOTE — Telephone Encounter (Signed)
Pt called states " I'd like to revisit the referral to outpatient rehab that I spoke to MD about last time I came. I'm having problems with my balance, Dexterity in my left foot/leg, difficulty walking, stiffness in my toes, when going up the stairs I have to lean on the banister very heavily to get up each stair. I am having problems with my hand strength. I'm unable to wear my blue jeans because I cannot zip, unzip or button, unbutton my jeans. I cannot sit up my coat or button my shirts. I'm having trouble with eating and holding the silverware." Reviewed above concerns again with pt, advised I will review with MD and call back with additional information. Pt request mobile number be removed as he does not have this phone anymore.

## 2016-10-15 NOTE — Telephone Encounter (Signed)
Returned call to pt. Unable to reach. LMOVM MD advised he will refer pt to neurology for further evaluation of these symptoms.

## 2016-10-26 ENCOUNTER — Telehealth: Payer: Self-pay | Admitting: Medical Oncology

## 2016-10-26 NOTE — Telephone Encounter (Signed)
Faxed referral to [facility] neurolog[facility]-pt notified.

## 2016-10-26 NOTE — Telephone Encounter (Signed)
Asking about referral to Neurology

## 2016-10-31 ENCOUNTER — Ambulatory Visit: Payer: Self-pay | Admitting: Neurology

## 2016-11-01 ENCOUNTER — Telehealth: Payer: Self-pay | Admitting: Medical Oncology

## 2016-11-01 NOTE — Telephone Encounter (Signed)
Pt has appt w neurology 11/12/16 w Dr Richardo Hanks.

## 2016-11-01 NOTE — Telephone Encounter (Signed)
Told pt to keep neurology appt.

## 2016-11-06 ENCOUNTER — Other Ambulatory Visit (HOSPITAL_BASED_OUTPATIENT_CLINIC_OR_DEPARTMENT_OTHER): Payer: Medicare Other

## 2016-11-06 ENCOUNTER — Ambulatory Visit (HOSPITAL_BASED_OUTPATIENT_CLINIC_OR_DEPARTMENT_OTHER): Payer: Medicare Other

## 2016-11-06 DIAGNOSIS — Z5111 Encounter for antineoplastic chemotherapy: Secondary | ICD-10-CM

## 2016-11-06 DIAGNOSIS — C8338 Diffuse large B-cell lymphoma, lymph nodes of multiple sites: Secondary | ICD-10-CM

## 2016-11-06 DIAGNOSIS — Z95828 Presence of other vascular implants and grafts: Secondary | ICD-10-CM

## 2016-11-06 LAB — CBC WITH DIFFERENTIAL/PLATELET
BASO%: 0.7 % (ref 0.0–2.0)
Basophils Absolute: 0 10*3/uL (ref 0.0–0.1)
EOS%: 1.1 % (ref 0.0–7.0)
Eosinophils Absolute: 0.1 10*3/uL (ref 0.0–0.5)
HCT: 37.1 % — ABNORMAL LOW (ref 38.4–49.9)
HEMOGLOBIN: 12.6 g/dL — AB (ref 13.0–17.1)
LYMPH%: 14.4 % (ref 14.0–49.0)
MCH: 35 pg — AB (ref 27.2–33.4)
MCHC: 33.8 g/dL (ref 32.0–36.0)
MCV: 103.4 fL — ABNORMAL HIGH (ref 79.3–98.0)
MONO#: 0.9 10*3/uL (ref 0.1–0.9)
MONO%: 18.1 % — AB (ref 0.0–14.0)
NEUT%: 65.7 % (ref 39.0–75.0)
NEUTROS ABS: 3.2 10*3/uL (ref 1.5–6.5)
Platelets: 162 10*3/uL (ref 140–400)
RBC: 3.59 10*6/uL — ABNORMAL LOW (ref 4.20–5.82)
RDW: 13.4 % (ref 11.0–14.6)
WBC: 4.9 10*3/uL (ref 4.0–10.3)
lymph#: 0.7 10*3/uL — ABNORMAL LOW (ref 0.9–3.3)

## 2016-11-06 LAB — COMPREHENSIVE METABOLIC PANEL
ALT: 18 U/L (ref 0–55)
ANION GAP: 9 meq/L (ref 3–11)
AST: 24 U/L (ref 5–34)
Albumin: 3.6 g/dL (ref 3.5–5.0)
Alkaline Phosphatase: 79 U/L (ref 40–150)
BILIRUBIN TOTAL: 0.52 mg/dL (ref 0.20–1.20)
BUN: 11.9 mg/dL (ref 7.0–26.0)
CALCIUM: 9.5 mg/dL (ref 8.4–10.4)
CHLORIDE: 107 meq/L (ref 98–109)
CO2: 26 mEq/L (ref 22–29)
Creatinine: 0.9 mg/dL (ref 0.7–1.3)
EGFR: 83 mL/min/{1.73_m2} — AB (ref >90)
Glucose: 119 mg/dl (ref 70–140)
Potassium: 4.3 mEq/L (ref 3.5–5.1)
Sodium: 141 mEq/L (ref 136–145)
Total Protein: 6.1 g/dL — ABNORMAL LOW (ref 6.4–8.3)

## 2016-11-06 MED ORDER — HEPARIN SOD (PORK) LOCK FLUSH 100 UNIT/ML IV SOLN
500.0000 [IU] | Freq: Once | INTRAVENOUS | Status: AC | PRN
  Administered 2016-11-06: 500 [IU] via INTRAVENOUS
  Filled 2016-11-06: qty 5

## 2016-11-06 MED ORDER — SODIUM CHLORIDE 0.9% FLUSH
10.0000 mL | INTRAVENOUS | Status: DC | PRN
  Administered 2016-11-06: 10 mL via INTRAVENOUS
  Filled 2016-11-06: qty 10

## 2016-11-12 ENCOUNTER — Encounter: Payer: Self-pay | Admitting: Neurology

## 2016-11-12 ENCOUNTER — Ambulatory Visit (INDEPENDENT_AMBULATORY_CARE_PROVIDER_SITE_OTHER): Payer: Medicare Other | Admitting: Neurology

## 2016-11-12 VITALS — BP 120/69 | HR 96 | Ht 68.0 in | Wt 184.6 lb

## 2016-11-12 DIAGNOSIS — M21372 Foot drop, left foot: Secondary | ICD-10-CM

## 2016-11-12 DIAGNOSIS — R269 Unspecified abnormalities of gait and mobility: Secondary | ICD-10-CM | POA: Diagnosis not present

## 2016-11-12 DIAGNOSIS — G620 Drug-induced polyneuropathy: Secondary | ICD-10-CM | POA: Diagnosis not present

## 2016-11-12 DIAGNOSIS — W19XXXA Unspecified fall, initial encounter: Secondary | ICD-10-CM

## 2016-11-12 DIAGNOSIS — M21371 Foot drop, right foot: Secondary | ICD-10-CM

## 2016-11-12 DIAGNOSIS — Z789 Other specified health status: Secondary | ICD-10-CM

## 2016-11-12 DIAGNOSIS — G608 Other hereditary and idiopathic neuropathies: Secondary | ICD-10-CM | POA: Diagnosis not present

## 2016-11-12 DIAGNOSIS — Z7409 Other reduced mobility: Secondary | ICD-10-CM | POA: Diagnosis not present

## 2016-11-12 DIAGNOSIS — G629 Polyneuropathy, unspecified: Secondary | ICD-10-CM | POA: Diagnosis not present

## 2016-11-12 DIAGNOSIS — T451X5A Adverse effect of antineoplastic and immunosuppressive drugs, initial encounter: Secondary | ICD-10-CM

## 2016-11-12 NOTE — Patient Instructions (Signed)
Remember to drink plenty of fluid, eat healthy meals and do not skip any meals. Try to eat protein with a every meal and eat a healthy snack such as fruit or nuts in between meals. Try to keep a regular sleep-wake schedule and try to exercise daily, particularly in the form of walking, 20-30 minutes a day, if you can.   As far as your medications are concerned, I would like to suggest: alpha lipoic acid 400-600mg  a day  As far as diagnostic testing: Labs  I would like to see you back in 3 months, sooner if we need to. Please call us with any interim questions, concerns, problems, updates or refill requests.   Our phone number is [phone]. We also have an after hours call service for urgent matters and there is a physician on-call for urgent questions. For any emergencies you know to call 911 or go to the nearest emergency room

## 2016-11-12 NOTE — Progress Notes (Signed)
[facility]    Provider:  Dr Jaynee Eagles Referring Provider: Lawerance Cruel, MD Primary Care Physician:  [name], MD  CC:  Neuropathy  HPI:  Seth Gilbert is a 81 y.o. male here as a referral from Dr. Harrington Challenger and Dr. Julien Nordmann for neuropathy. Diagnosed with stage II large B cell non-Hodgkin lymphoma diagnosed in August 2017 s/p chemotherapy with CHOP/Rituxan every 3 weeks with Neulasta support status post 6 cycles (cyclophosphamide, doxorubicin, vincristine, and prednisone). Symptoms started with chemotherapy. Happened in the last 2 cycles. Got a little worse after the end of chemo and now he is improving. His symptoms wax and wane. He denies numbness or paresthesias but he describes difficulty unzippering his jacket, decreased fine motor movements especially in his hands, decreased grip. In his feet, the joints at his toes felt stiff in January. It affected his walking for several months. He is trying to do "toe lifts", he has balance problems, he has had 2 falls tripping over things and losing balance. No pain. He has weakness as well. He lost weight and muscle tone as well during chemo. Difficulty getting out of low seats.  Reviewed notes, labs and imaging from outside physicians, which showed:  Patient has systemic diagnosed with stage II large B cell non-Hodgkin lymphoma diagnosed in August 2017. chemotherapy with CHOP/Rituxan every 3 weeks with Neulasta support status post 6 cycles.chemotherapy with CHOP/Rituxan every 3 weeks with Neulasta support status post 6 cycles (cyclophosphamide, doxorubicin, vincristine, and prednisone). He has complained of generalized weakness in the upper and lower extremities. He also has evidence of peripheral neuropathy and no significant numbness more so weakness. He denied any chest pain shortness of breath cough or hemoptysis. No fever or chills. No nausea or other associated symptoms. His PET scan showed almost complete response with no  findings suspicious for acute lymphoma. Patient also reported having difficulty with balance, dexterity and left foot leg, difficulty walking, stiffness in the toes, weakness, difficulty with fine motor of the hands, cannot zip up his coat or button his shirts. Also having trouble with eating and holding the silverware.  Personally reviewed MRI of the brain images and agree with the following:  IMPRESSION: 1. Negative MRI for intracranial metastatic disease or other mass lesion. No other acute intracranial process. 2. Age appropriate cerebral atrophy with mild chronic small vessel ischemic disease.    Review of Systems: Patient complains of symptoms per HPI as well as the following symptoms: no CP, no SOB. Pertinent negatives per HPI. All others negative.   Social History   Social History  . Marital status: Married    Spouse name: N/A  . Number of children: 2  . Years of education: BS   Occupational History  . Retired    Social History Main Topics  . Smoking status: Never Smoker  . Smokeless tobacco: Never Used  . Alcohol use No     Comment: occasional- a couple times a month  . Drug use: No  . Sexual activity: Not on file   Other Topics Concern  . Not on file   Social History Narrative   Lives at home w/ his wife   Right-handed   Caffeine: 1 cup of coffee    Family History  Problem Relation Age of Onset  . COPD Mother   . Heart disease Father   . Neuropathy Neg Hx     Past Medical History:  Diagnosis Date  . Cancer ([facility])    NHL  . Diverticulitis   .  GERD (gastroesophageal reflux disease)    Heartburn at times  . Heart murmur    "comes and goes" onset 4th grade - last time anyone heard it was 01/2014  . History of hiatal hernia   . History of kidney stones   . HOH (hard of hearing)   . Non-Hodgkin lymphoma ([place])   . Peripheral neuropathy ([facility]) 09/11/2016  . PONV (postoperative nausea and vomiting)   . Rosacea   . Wears glasses     Past Surgical  History:  Procedure Laterality Date  . APPENDECTOMY     2-3 yrs old  . COLONOSCOPY    . HEMORROIDECTOMY     inflammed anal gland removed  . INGUINAL HERNIA REPAIR Left 02/11/2014   Procedure: LEFT INGUINAL HERNIA REPAIR WITH MESH ;  Surgeon: Joyice Faster. Cornett, MD;  Location: [facility];  Service: General;  Laterality: Left;  . INSERTION OF MESH N/A 02/11/2014   Procedure: INSERTION OF MESH;  Surgeon: Joyice Faster. Cornett, MD;  Location: [facility];  Service: General;  Laterality: N/A;  . PORTACATH PLACEMENT Left 04/10/2016   Procedure: INSERTION PORT-A-CATH into LEFT Subclavian Vein with guidance from ultrasound and flouroscopy;  Surgeon: Grace Isaac, MD;  Location: [facility];  Service: Thoracic;  Laterality: Left;  . TONSILLECTOMY    . VIDEO BRONCHOSCOPY WITH ENDOBRONCHIAL ULTRASOUND N/A 03/30/2016   Procedure: VIDEO BRONCHOSCOPY WITH ENDOBRONCHIAL Gerrit Heck TRANSBRONCHIAL BIOPSY;  Surgeon: Grace Isaac, MD;  Location: [facility];  Service: Thoracic;  Laterality: N/A;    Current Outpatient Prescriptions  Medication Sig Dispense Refill  . ampicillin (PRINCIPEN) 500 MG capsule Take 500 mg by mouth daily.    . famotidine (PEPCID) 40 MG tablet Take 40 mg by mouth daily as needed for heartburn.     . Glucosamine HCl (GLUCOSAMINE PO) Take 1,800 mg by mouth 3 (three) times daily.    . metroNIDAZOLE (METROGEL) 0.75 % gel Apply 1 application topically daily.     . Multiple Vitamins-Minerals (CENTRUM SILVER PO) Take 1 tablet by mouth daily.     Marland Kitchen senna (SENOKOT) 8.6 MG tablet Take 1 tablet by mouth daily as needed for constipation.    . vitamin E 400 UNIT capsule Take 400 Units by mouth daily.     No current facility-administered medications for this visit.     Allergies as of 11/12/2016 - Review Complete 11/12/2016  Allergen Reaction Noted  . No known allergies  04/09/2016    Vitals: BP 120/69   Pulse 96   Ht 5\' 8"  (1.727 m)   Wt 184 lb 9.6 oz (83.7 kg)    BMI 28.07 kg/m  Last Weight:  Wt Readings from Last 1 Encounters:  11/12/16 184 lb 9.6 oz (83.7 kg)   Last Height:   Ht Readings from Last 1 Encounters:  11/12/16 5\' 8"  (1.727 m)   Physical exam: Exam: Gen: NAD, conversant, well nourised, obese, well groomed                     CV: RRR, no MRG. No Carotid Bruits. No peripheral edema, warm, nontender Eyes: Conjunctivae clear without exudates or hemorrhage  Neuro: Detailed Neurologic Exam  Speech:    Speech is normal; fluent and spontaneous with normal comprehension.  Cognition:    The patient is oriented to person, place, and time;     recent and remote memory intact;     language fluent;     normal attention, concentration,  fund of knowledge Cranial Nerves:    The pupils are equal, round, and reactive to light. Attempted funduscopic exam could not visualize due to small pupils. Visual fields are full to finger confrontation. Extraocular movements are intact. Trigeminal sensation is intact and the muscles of mastication are normal. The face is symmetric. The palate elevates in the midline. Hearing intact. Voice is normal. Shoulder shrug is normal. The tongue has normal motion without fasciculations.   Coordination:    Normal finger to nose and heel to shin. Normal rapid alternating movements.   Gait:    Slapping feet due to bilateral foot drop, stomping gait due to sensory neruopathy, Difficulty with heel and toe walk and tandem, weakness cannot heel walk and imbalance   Motor Observation:     no involuntary movements noted. Difficulty getting out of seat without using hands Tone:    Normal muscle tone.    Posture:    Posture is normal. normal erect    Strength:     Left triceps 5- Weak interossei and grip bilat foot drop 2/5 distally Otherwise intact  Sensation:   +romberg.  Intact pp in distal LE and UE Impaired temperature to wrists and mid calfs Vibration absent great toes, a few seconds medial  malleolus Intact vibration UE DIP Impaired propriocetion great toes     Reflex Exam:  DTR's:    Absent AJs.   Toes:    The toes are downgoing bilaterally.   Clonus:    Clonus is absent.      Assessment/Plan:   QUEST TAVENNER is a very lovely 81 y.o. male here as a referral from Dr. Harrington Challenger for neuropathy likely chemotherapy related. vinca, alkaloid (vincristine, vinblastine, vinorelbine) can cause a sensorimotor, cranial or automatic axonal neuropathy. He received Vincristine in his therapy for stage II large B cell non-Hodgkin lymphoma. 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 Had a long discussion with patient and his wife regarding neuropathy, small versus large fiber sensory versus motor neuropathy. He has imbalance, falls, abnormal gait which is due to a sensorimotor neuropathy and I discussed this in detail. Fortunately he is improving and he denies any significant pain. He can try alpha lipoic acid which has been shown beneficial in diabetic neuropathy but not in chemotherapy related neuropathy however may not hurt. At this point recovery depends on time, PT and OT.  Will check serum neuropathy labs in the unlikely chance he has another risk factor for neuropathy such as b12 deficiency.  PT for gait and balance and weakness, falls, bilateral foot drop (may benefit from orthoses), fall risk OT for difficulty with fine motor movements, difficult using utencils and dressing (buttoning, zippering, putting on socks)  Cc:  Dr. Harrington Challenger and Dr. Lorenza Cambridge, [facility] [address] [place], [place] [address]  Phone [phone] Fax [phone]

## 2016-11-15 ENCOUNTER — Telehealth: Payer: Self-pay

## 2016-11-15 LAB — VITAMIN B6: Vitamin B6: 26.9 ug/L (ref 5.3–46.7)

## 2016-11-15 LAB — B12 AND FOLATE PANEL: VITAMIN B 12: 304 pg/mL (ref 232–1245)

## 2016-11-15 LAB — TSH: TSH: 7.23 u[IU]/mL — AB (ref 0.450–4.500)

## 2016-11-15 LAB — HEMOGLOBIN A1C
ESTIMATED AVERAGE GLUCOSE: 100 mg/dL
Hgb A1c MFr Bld: 5.1 % (ref 4.8–5.6)

## 2016-11-15 LAB — PARANEOPLASTIC PROFILE 1
Neuronal Nuc Ab (Ri), IFA: <1:10 {titer}
Neuronal Nuclear (Hu) Antibody (IB): <1:10 {titer}

## 2016-11-15 LAB — METHYLMALONIC ACID, SERUM: METHYLMALONIC ACID: 167 nmol/L (ref 0–378)

## 2016-11-15 LAB — HEAVY METALS, BLOOD
Arsenic: 10 ug/L (ref 2–23)
LEAD, BLOOD: 2 ug/dL (ref 0–19)
Mercury: NOT DETECTED ug/L (ref 0.0–14.9)

## 2016-11-15 LAB — B. BURGDORFI ANTIBODIES

## 2016-11-15 LAB — VITAMIN B1: Thiamine: 112.9 nmol/L (ref 66.5–200.0)

## 2016-11-15 NOTE — Telephone Encounter (Signed)
-----   Message from Melvenia Beam, MD sent at 11/15/2016  9:36 AM EDT ----- TSH is quite abnormal, could indicate hypothyroidism. They need to see their pcp for further eval and possibly treatment. Please fax results to pcp thanks

## 2016-11-15 NOTE — Telephone Encounter (Signed)
Called pt w/ abnormal lab result. Verbalized understanding and appreciation for call. Report also faxed to PCP, Dr. Clenton Pare. [employer] T # [phone]. F # [phone].

## 2016-11-21 DIAGNOSIS — R946 Abnormal results of thyroid function studies: Secondary | ICD-10-CM | POA: Diagnosis not present

## 2016-11-21 DIAGNOSIS — E039 Hypothyroidism, unspecified: Secondary | ICD-10-CM | POA: Diagnosis not present

## 2016-11-27 ENCOUNTER — Ambulatory Visit: Payer: Medicare Other | Attending: Neurology | Admitting: Physical Therapy

## 2016-11-27 ENCOUNTER — Ambulatory Visit: Payer: Medicare Other | Admitting: Occupational Therapy

## 2016-11-27 ENCOUNTER — Encounter: Payer: Self-pay | Admitting: Physical Therapy

## 2016-11-27 DIAGNOSIS — M21371 Foot drop, right foot: Secondary | ICD-10-CM | POA: Insufficient documentation

## 2016-11-27 DIAGNOSIS — R2681 Unsteadiness on feet: Secondary | ICD-10-CM

## 2016-11-27 DIAGNOSIS — M6281 Muscle weakness (generalized): Secondary | ICD-10-CM | POA: Diagnosis present

## 2016-11-27 DIAGNOSIS — M21372 Foot drop, left foot: Secondary | ICD-10-CM | POA: Insufficient documentation

## 2016-11-27 DIAGNOSIS — R208 Other disturbances of skin sensation: Secondary | ICD-10-CM | POA: Diagnosis present

## 2016-11-27 DIAGNOSIS — R278 Other lack of coordination: Secondary | ICD-10-CM | POA: Insufficient documentation

## 2016-11-27 DIAGNOSIS — R296 Repeated falls: Secondary | ICD-10-CM | POA: Diagnosis present

## 2016-11-27 DIAGNOSIS — R262 Difficulty in walking, not elsewhere classified: Secondary | ICD-10-CM

## 2016-11-27 NOTE — Therapy (Signed)
[facility] [address] [place], [place], [address] Phone: [phone]   Fax:  [phone]  Physical Therapy Evaluation  Patient Details  Name: Seth Gilbert MRN: [id number] Date of Birth: 06/21/1936 Referring Provider: Melvenia Beam, MD  Encounter Date: 11/27/2016      PT End of Session - 11/27/16 2044    Visit Number 1   Number of Visits 17   Date for PT Re-Evaluation 01/26/17   Authorization Type [employer] [place] code and progress note 10th visit   PT Start Time 1403   PT Stop Time 1455   PT Time Calculation (min) 52 min   Activity Tolerance Patient tolerated treatment well   Behavior During Therapy [facility] for tasks assessed/performed      Past Medical History:  Diagnosis Date  . Cancer ([place])    NHL  . Diverticulitis   . GERD (gastroesophageal reflux disease)    Heartburn at times  . Heart murmur    "comes and goes" onset 4th grade - last time anyone heard it was 01/2014  . History of hiatal hernia   . History of kidney stones   . HOH (hard of hearing)   . Non-Hodgkin lymphoma ([place])   . Peripheral neuropathy ([place]) 09/11/2016  . PONV (postoperative nausea and vomiting)   . Rosacea   . Wears glasses     Past Surgical History:  Procedure Laterality Date  . APPENDECTOMY     2-3 yrs old  . COLONOSCOPY    . HEMORROIDECTOMY     inflammed anal gland removed  . INGUINAL HERNIA REPAIR Left 02/11/2014   Procedure: LEFT INGUINAL HERNIA REPAIR WITH MESH ;  Surgeon: Joyice Faster. Cornett, MD;  Location: [place];  Service: General;  Laterality: Left;  . INSERTION OF MESH N/A 02/11/2014   Procedure: INSERTION OF MESH;  Surgeon: Joyice Faster. Cornett, MD;  Location: [facility];  Service: General;  Laterality: N/A;  . PORTACATH PLACEMENT Left 04/10/2016   Procedure: INSERTION PORT-A-CATH into LEFT Subclavian Vein with guidance from ultrasound and flouroscopy;  Surgeon: Grace Isaac, MD;  Location: [place];  Service: Thoracic;  Laterality: Left;  . TONSILLECTOMY    . VIDEO BRONCHOSCOPY WITH ENDOBRONCHIAL ULTRASOUND N/A 03/30/2016   Procedure: VIDEO BRONCHOSCOPY WITH ENDOBRONCHIAL ULTRASOUND,WITH TRANSBRONCHIAL BIOPSY;  Surgeon: Grace Isaac, MD;  Location: [facility];  Service: Thoracic;  Laterality: N/A;    There were no vitals filed for this visit.       Subjective Assessment - 11/27/16 1410    Subjective Pt presents to OPPT due to increased difficulty with fine and gross motor skills, climbing stairs, sit > stand, walking and has had 3 falls.  Pt with recent diagnosis of chemo induced peripheral neuropathy and hypothyroidism.   Patient is accompained by: Family member   Pertinent History lymphoma with chemo, HOH   Limitations Walking;Other (comment)   Patient Stated Goals Get back to normal function.     Currently in Pain? No/denies            [facility] PT Assessment - 11/27/16 1415      Assessment   Medical Diagnosis chemo induced peripheral neuropathy, falls   Referring Provider Melvenia Beam, MD   Hand Dominance Right   Next MD Visit August 2018   Prior Therapy no     Precautions   Precautions Fall;Other (comment)  cancer   Precaution Comments lymphoma, chemo     Balance  Screen   Has the patient fallen in the past 6 months Yes   How many times? 3   Has the patient had a decrease in activity level because of a fear of falling?  Yes   Is the patient reluctant to leave their home because of a fear of falling?  Yes     [place] Private residence   Living Arrangements Spouse/significant other   Available Help at Discharge Family   Type of [place] to enter   Entrance Stairs-Number of Steps 1   Home Layout Two level;Bed/bath upstairs   Alternate Level Stairs-Number of Steps 13   Alternate Level Stairs-Rails Left     Prior Function   Level of Independence Independent      Observation/Other Assessments   Focus on Therapeutic Outcomes (FOTO)  54% (46% limited; predicted 31% limited)   Other Surveys  Other Surveys   Activities of Balance Confidence Scale (ABC Scale)  64.4%     Sensation   Light Touch Impaired by gross assessment   Additional Comments intact to light touch but decreased in lower leg and feet     Strength   Overall Strength Deficits   Overall Strength Comments 4/5 overall; limited sustained mm activation     Ambulation/Gait   Ambulation/Gait Yes   Ambulation/Gait Assistance 6: Modified independent (Device/Increase time)   Ambulation Distance (Feet) 300 Feet   Assistive device None   Gait Pattern Step-through pattern;Decreased dorsiflexion - right;Decreased dorsiflexion - left;Poor foot clearance - left;Poor foot clearance - right  foot slap   Ambulation Surface Level;Indoor   Stairs Yes   Stairs Assistance 4: Min assist   Stairs Assistance Details (indicate cue type and reason) Min A without rails, Mod I with one rail   Stair Management Technique No rails;Alternating pattern;Step to pattern;Forwards;One rail Left   Number of Stairs 8   Height of Stairs 6     Balance   Balance Assessed Yes     Standardized Balance Assessment   Standardized Balance Assessment Five Times Sit to Stand;10 meter walk test   Five times sit to stand comments  12.32   10 Meter Walk 10.66 seconds or 3.07 ft/sec     High Level Balance   High Level Balance Comments MCTSIB: 30 seconds condition 1, 30 seconds condition 2, 30 seconds condition 3, 18 seconds condition 4                           PT Education - 11/27/16 2043    Education provided Yes   Education Details Clinical findings, PT goals and POC   Person(s) Educated Patient;Spouse   Methods Explanation   Comprehension Verbalized understanding          PT Short Term Goals - 11/27/16 2052      PT SHORT TERM GOAL #1   Title (TARGET DATE FOR STG 12/19/16)  Pt will participate  in FGA with LTG to be set   Status New     PT SHORT TERM GOAL #2   Title Pt will improve functional LE strength as indicated by decrease in 5x sit to stand score of <10 seconds   Baseline 12.32   Status New     PT SHORT TERM GOAL #3   Title Pt will improve safety with gait as indicated by improvement in gait velocity to >3.5 ft/sec   Baseline 3.29ft/sec   Status New  PT SHORT TERM GOAL #4   Title Pt will negotiate 8 stairs, alternating sequence with no rail and supervision    Baseline min A without rail   Status New     PT SHORT TERM GOAL #5   Title Pt will be able to maintain 30 seconds on all conditions on MCTSIB to indicate improved use of all sensory systems   Baseline 18 seconds on condition 4           PT Long Term Goals - 12/09/16 December 06, 2054      PT LONG TERM GOAL #1   Title (TARGET DATE FOR ALL LTG 01/09/17) Pt will demonstrate independence with LE strengthening and balance HEP   Status New     PT LONG TERM GOAL #2   Title Pt will improve FGA score to >25/30 to decrease falls risk    Baseline TBD   Status New     PT LONG TERM GOAL #3   Title Pt will improve safety with community gait as indicated by gait velocity of >67ft/sec   Status New     PT LONG TERM GOAL #4   Title Pt will ambulate >1000 over outdoor/uneven terrain and up/down curb at Mod I level and no evidence of LOB and improved foot clearance   Status New     PT LONG TERM GOAL #5   Title Pt will improve ABC score to >80% confidence   Baseline 64.4%   Status New               Plan - 12/09/2016 12/06/2043    Clinical Impression Statement Pt is a 81 year old male presenting to OPPT neuro for medium complexity PT evaluation for chemo induced peripheral neuropathy and multiple falls. Pt's PMH significant for the following: lymphoma and chemotherapy, hypothyroidism. The following deficits were noted during pt's exam: muscle weakness, impaired sensation, impaired dynamic balance, and impaired gait. Pt's gait  speed indicates pt is below the normal walking speed. Pt's 5 times sit to stand score indicates pt is at risk for falls. Pt would benefit from skilled PT to address these impairments and functional limitations to maximize functional mobility independence and reduce falls risk.   Rehab Potential Good   Clinical Impairments Affecting Rehab Potential lymphoma, chemo   PT Frequency 2x / week   PT Duration 8 weeks  likely will D/C between 4-6 weeks   PT Treatment/Interventions ADLs/Self Care Home Management;DME Instruction;Gait training;Stair training;Functional mobility training;Therapeutic activities;Therapeutic exercise;Balance training;Neuromuscular re-education;Patient/family education;Orthotic Fit/Training;Energy conservation   PT Next Visit Plan Test FGA and set LTG; initiate HEP for LE strengthening and balance (vision deprived)   Consulted and Agree with Plan of Care Patient;Family member/caregiver   Family Member Consulted Wife-Aggie      Patient will benefit from skilled therapeutic intervention in order to improve the following deficits and impairments:  Abnormal gait, Decreased activity tolerance, Decreased balance, Decreased endurance, Decreased strength, Difficulty walking, Impaired sensation  Visit Diagnosis: Foot drop, left  Foot drop, right  Muscle weakness (generalized)  Repeated falls  Difficulty in walking, not elsewhere classified      G-Codes - 12-09-16 05-Dec-2099    Functional Assessment Tool Used (Outpatient Only) ABC score, 5x sit to stand, gait velocity   Functional Limitation Mobility: Walking and moving around   Mobility: Walking and Moving Around Current Status (I2979) At least 60 percent but less than 80 percent impaired, limited or restricted   Mobility: Walking and Moving Around Goal Status (G9211) At least 1 percent  but less than 20 percent impaired, limited or restricted       Problem List Patient Active Problem List   Diagnosis Date Noted  . Port  catheter in place 11/06/2016  . Peripheral neuropathy ([place]) 09/11/2016  . Atypical pneumonia 06/18/2016  . Acute respiratory failure with hypoxia ([place]) 06/18/2016  . Fever 06/16/2016  . Lactic acidosis 06/16/2016  . Dehydration 06/16/2016  . Encounter for antineoplastic chemotherapy 04/17/2016  . NHL (non-Hodgkin's lymphoma) ([place]) 04/05/2016  . Diffuse large B-cell lymphoma of lymph nodes of multiple regions ([place]) 04/05/2016  . Lung mass 03/13/2016  . Post-operative state 03/08/2014  . Left inguinal hernia 05/08/2013   Raylene Everts, PT, DPT 11/27/16    9:02 PM    [facility] [address] [place], [place], [address] Phone: [phone]   Fax:  [phone]  Name: Seth Gilbert MRN: [id number] Date of Birth: 1935-10-12

## 2016-11-28 DIAGNOSIS — M21372 Foot drop, left foot: Secondary | ICD-10-CM | POA: Diagnosis not present

## 2016-11-28 NOTE — Therapy (Signed)
[facility] [address] [place], [place], [address] Phone: [phone]   Fax:  [phone]  Occupational Therapy Treatment  Patient Details  Name: Seth Gilbert MRN: [id number] Date of Birth: Jan 21, 1936 Referring Provider: Dr. Jaynee Eagles  Encounter Date: 11/27/2016      OT End of Session - 11/28/16 1635    Visit Number 1   Number of Visits 17   Date for OT Re-Evaluation 01/25/17   Authorization Type Blue MCR   Authorization - Visit Number 1   Authorization - Number of Visits 10   OT Start Time 0240   OT Stop Time 1530   OT Time Calculation (min) 36 min   Activity Tolerance Patient tolerated treatment well   Behavior During Therapy [facility] for tasks assessed/performed      Past Medical History:  Diagnosis Date  . Cancer ([place])    NHL  . Diverticulitis   . GERD (gastroesophageal reflux disease)    Heartburn at times  . Heart murmur    "comes and goes" onset 4th grade - last time anyone heard it was 01/2014  . History of hiatal hernia   . History of kidney stones   . HOH (hard of hearing)   . Non-Hodgkin lymphoma ([place])   . Peripheral neuropathy ([place]) 09/11/2016  . PONV (postoperative nausea and vomiting)   . Rosacea   . Wears glasses     Past Surgical History:  Procedure Laterality Date  . APPENDECTOMY     2-3 yrs old  . COLONOSCOPY    . HEMORROIDECTOMY     inflammed anal gland removed  . INGUINAL HERNIA REPAIR Left 02/11/2014   Procedure: LEFT INGUINAL HERNIA REPAIR WITH MESH ;  Surgeon: Joyice Faster. Cornett, MD;  Location: [place];  Service: General;  Laterality: Left;  . INSERTION OF MESH N/A 02/11/2014   Procedure: INSERTION OF MESH;  Surgeon: Joyice Faster. Cornett, MD;  Location: [facility];  Service: General;  Laterality: N/A;  . PORTACATH PLACEMENT Left 04/10/2016   Procedure: INSERTION PORT-A-CATH into LEFT Subclavian Vein with guidance from ultrasound and flouroscopy;  Surgeon:  Grace Isaac, MD;  Location: [facility];  Service: Thoracic;  Laterality: Left;  . TONSILLECTOMY    . VIDEO BRONCHOSCOPY WITH ENDOBRONCHIAL ULTRASOUND N/A 03/30/2016   Procedure: VIDEO BRONCHOSCOPY WITH ENDOBRONCHIAL ULTRASOUND,WITH TRANSBRONCHIAL BIOPSY;  Surgeon: Grace Isaac, MD;  Location: [facility];  Service: Thoracic;  Laterality: N/A;    There were no vitals filed for this visit.      Subjective Assessment - 11/28/16 1703    Subjective  81 year old male presenting to [facility] neuro for  chemo induced peripheral neuropathy with decreased coordination and  multiple falls. Pt's PMH significant for the following: lymphoma and chemotherapy, hypothyroidism. Pt can benefit from skilled occupational therapy to address decreased strength, decreased coordination, decreased balance, and sensory impairments which impede performance of ADLS/IADLS in order to maintain pt's quality of life.   Pertinent History  chemo induced peripheral neuropathy with decreased coordination and multiple falls. Pt's PMH: lymphoma and chemotherapy, hypothyroidism.   Patient Stated Goals improve coordination, strength   Currently in Pain? No/denies            [facility] OT Assessment - 11/28/16 0001      Assessment   Diagnosis peripheral neuropathy, chemo therapy related  lymphoma treated with chemo   Referring Provider Dr. Jaynee Eagles   Onset Date --  January 2018- developed neuropathy  Precautions   Precautions Fall;Other (comment)   Precaution Comments lymphoma, chemo     Balance Screen   Has the patient fallen in the past 6 months Yes   How many times? 3   Has the patient had a decrease in activity level because of a fear of falling?  No   Is the patient reluctant to leave their home because of a fear of falling?  No     Home  Environment   Family/patient expects to be discharged to: Private residence   [place] Two level   Lives With [place] --  taught at [employer], retired [employer]   Leisure mow grass, spend time with family, walking     ADL   Eating/Feeding Modified independent  increased spills at times, difficulty cutting food t times   Grooming Modified independent   Upper Body Bathing Modified independent   Lower Body Bathing Modified independent   Upper Body Dressing Needs assist for fasteners;Minimal assistance   Lower Body Dressing Needs assist for fasteners;Supervision/safety   Toilet Tranfer Modified independent   Tub/Shower Transfer Modified independent   ADL comments difficulty with buttons, manipulating utensils     IADL   Shopping --  did not shop beforehand   Light Housekeeping Performs light daily tasks such as dishwashing, bed making   Meal Prep --  Does not Engineer, production financial matters independently (budgets, writes checks, pays rent, bills goes to bank), collects and keeps track of income     Mobility   Mobility Status Independent     Written Expression   Dominant Hand Right   Handwriting 100% legible     Vision - History   Baseline Vision Bifocals     Vision Assessment   Vision Assessment Vision not tested     Cognition   Overall Cognitive Status Within Functional Limits for tasks assessed     Observation/Other Assessments   Focus on Therapeutic Outcomes (FOTO)  54% (46% limited; predicted 31% limited)     Sensation   Light Touch Impaired by gross assessment  tip of left thumb   Stereognosis Appears Intact  able to id items with both hands   Additional Comments mild impairment only     Coordination   9 Hole Peg Test Right;Left   Right 9 Hole Peg Test 35.97 secs,   Left 9 Hole Peg Test 33.19 secs   Coordination impaired for fine motor     ROM / Strength   AROM / PROM / Strength AROM;Strength     AROM   Overall AROM  Deficits   Overall AROM Comments right shoulder flexion: 110, abduction 100, left shoulder  flexion 110, left shoulder abduction : 100*     Strength   Overall Strength Deficits   Overall Strength Comments RUE 5/5, LUE grossly 4+/5     Hand Function   Right Hand Grip (lbs) 60 lbs   Left Hand Grip (lbs) 35 lbs                            OT Short Term Goals - 11/28/16 1637      OT SHORT TERM GOAL #1   Title I with HEP- due 12/26/16   Time 4   Period Weeks   Status New     OT SHORT TERM  GOAL #2   Title Pt will verbalize understanding of adapted strategies for ADLs/IADLS and sensory precautions.   Time 4   Period Weeks   Status New     OT SHORT TERM GOAL #3   Title Test 3 button/ unbutton and write goal prn   Time 4   Period Weeks   Status New     OT SHORT TERM GOAL #4   Title Test standing functional reach and set goal prn   Time 4   Period Weeks   Status New           OT Long Term Goals - 2016/12/23 1651      OT LONG TERM GOAL #1   Title Pt will perform mod complex home management activities at a modified independent level .-01/25/17   Time 8   Period Weeks   Status New     OT LONG TERM GOAL #2   Title Pt will demonstrate improved fine motor coordination for ADLs/IADLS as  evidenced by decreasing bilateral 9 hole peg test score by 3 secs.   Time 8   Period Weeks   Status New     OT LONG TERM GOAL #3   Title Pt will increase LUE grip strength to 45 lbs or greater for increased ease with IADLs.   Time 8   Period Weeks   Status New     OT LONG TERM GOAL #4   Title Pt will demonstrate improved bilateral strength and functional use as evidenced by retrieving a 3 lbs weight at 120 shoulder flexion for right and left UE's.   Time 8   Period Weeks   Status New               Plan - 2016-12-23 1631    Clinical Impression Statement Pt is a 81 year old male presenting to [facility] neuro for  chemo induced peripheral neuropathy with decreased coordination and  multiple falls. Pt's PMH significant for the following: lymphoma and  chemotherapy, hypothyroidism. Pt can benefit from skilled occupational therapy to address decreased strength, decreased coordination, decreased balance, and sensory impairments which impede performance of ADLS/IADLS in order to maintain pt's quality of life.   Rehab Potential Good   OT Frequency 2x / week   OT Duration 8 weeks  will likely d/c after 4-6 weeks   OT Treatment/Interventions Self-care/ADL training;Moist Heat;Fluidtherapy;DME and/or AE instruction;Patient/family education;Balance training;Therapeutic exercises;Ultrasound;Therapeutic exercise;Therapeutic activities;Passive range of motion;Functional Mobility Training;Neuromuscular education;Cryotherapy;Electrical Stimulation;Parrafin;Energy conservation;Manual Therapy   Plan initate coordination and putty HEP   Consulted and Agree with Plan of Care Patient;Family member/caregiver   Family Member Consulted wife      Patient will benefit from skilled therapeutic intervention in order to improve the following deficits and impairments:  Abnormal gait, Pain, Impaired sensation, Decreased coordination, Decreased mobility, Decreased endurance, Decreased range of motion, Decreased strength, Impaired tone, Impaired UE functional use, Difficulty walking, Decreased safety awareness, Decreased knowledge of precautions, Decreased balance  Visit Diagnosis: Other lack of coordination  Muscle weakness (generalized)  Unsteadiness on feet  Other disturbances of skin sensation      G-Codes - 23-Dec-2016 1705    Functional Assessment Tool Used (Outpatient only) Grip strength: RUE 60 lbs, LUE 35 lbs, difficulty fastening buttons, clinical impressions   Functional Limitation Carrying, moving and handling objects   Carrying, Moving and Handling Objects Current Status (J1914) At least 20 percent but less than 40 percent impaired, limited or restricted   Carrying, Moving and Handling Objects Goal Status (N8295)  At least 1 percent but less than 20 percent  impaired, limited or restricted      Problem List Patient Active Problem List   Diagnosis Date Noted  . Port catheter in place 11/06/2016  . Peripheral neuropathy ([place]) 09/11/2016  . Atypical pneumonia 06/18/2016  . Acute respiratory failure with hypoxia ([place]) 06/18/2016  . Fever 06/16/2016  . Lactic acidosis 06/16/2016  . Dehydration 06/16/2016  . Encounter for antineoplastic chemotherapy 04/17/2016  . NHL (non-Hodgkin's lymphoma) ([place]) 04/05/2016  . Diffuse large B-cell lymphoma of lymph nodes of multiple regions ([place]) 04/05/2016  . Lung mass 03/13/2016  . Post-operative state 03/08/2014  . Left inguinal hernia 05/08/2013    RINE,KATHRYN 11/28/2016, 5:09 PM Theone Murdoch, OTR/L [phone] Phone: [phone] 5:09 PM 11/28/16 [facility] [address] [place], [place], [address] Phone: [phone]   Fax:  [phone]  Name: Seth Gilbert MRN: [id number] Date of Birth: 04/08/1936

## 2016-12-05 ENCOUNTER — Ambulatory Visit: Payer: Medicare Other | Admitting: Physical Therapy

## 2016-12-05 ENCOUNTER — Ambulatory Visit: Payer: Medicare Other | Admitting: Occupational Therapy

## 2016-12-05 ENCOUNTER — Encounter: Payer: Self-pay | Admitting: Physical Therapy

## 2016-12-05 DIAGNOSIS — R2681 Unsteadiness on feet: Secondary | ICD-10-CM

## 2016-12-05 DIAGNOSIS — R262 Difficulty in walking, not elsewhere classified: Secondary | ICD-10-CM

## 2016-12-05 DIAGNOSIS — M6281 Muscle weakness (generalized): Secondary | ICD-10-CM

## 2016-12-05 DIAGNOSIS — M21372 Foot drop, left foot: Secondary | ICD-10-CM | POA: Diagnosis not present

## 2016-12-05 DIAGNOSIS — M21371 Foot drop, right foot: Secondary | ICD-10-CM

## 2016-12-05 DIAGNOSIS — R278 Other lack of coordination: Secondary | ICD-10-CM

## 2016-12-05 DIAGNOSIS — R296 Repeated falls: Secondary | ICD-10-CM

## 2016-12-05 DIAGNOSIS — R208 Other disturbances of skin sensation: Secondary | ICD-10-CM

## 2016-12-05 NOTE — Therapy (Signed)
[facility] [address] [place], [place], [address] Phone: [phone]   Fax:  [phone]  Physical Therapy Treatment  Patient Details  Name: Seth Gilbert MRN: [id number] Date of Birth: [birth year] Referring Provider: Melvenia Beam, MD  Encounter Date: 12/05/2016      PT End of Session - 12/05/16 1242    Visit Number 2   Number of Visits 17   Date for PT Re-Evaluation 01/26/17   Authorization Type [employer] [place] code and progress note 10th visit   PT Start Time 1235   PT Stop Time 1315   PT Time Calculation (min) 40 min   Equipment Utilized During Treatment Gait belt   Activity Tolerance Patient tolerated treatment well   Behavior During Therapy [facility] for tasks assessed/performed      Past Medical History:  Diagnosis Date  . Cancer ([place])    NHL  . Diverticulitis   . GERD (gastroesophageal reflux disease)    Heartburn at times  . Heart murmur    "comes and goes" onset 4th grade - last time anyone heard it was 01/2014  . History of hiatal hernia   . History of kidney stones   . HOH (hard of hearing)   . Non-Hodgkin lymphoma ([place])   . Peripheral neuropathy ([place]) 09/11/2016  . PONV (postoperative nausea and vomiting)   . Rosacea   . Wears glasses     Past Surgical History:  Procedure Laterality Date  . APPENDECTOMY     2-3 yrs old  . COLONOSCOPY    . HEMORROIDECTOMY     inflammed anal gland removed  . INGUINAL HERNIA REPAIR Left 02/11/2014   Procedure: LEFT INGUINAL HERNIA REPAIR WITH MESH ;  Surgeon: Joyice Faster. Cornett, MD;  Location: [facility];  Service: General;  Laterality: Left;  . INSERTION OF MESH N/A 02/11/2014   Procedure: INSERTION OF MESH;  Surgeon: Joyice Faster. Cornett, MD;  Location: [place];  Service: General;  Laterality: N/A;  . PORTACATH PLACEMENT Left 04/10/2016   Procedure: INSERTION PORT-A-CATH into LEFT Subclavian Vein with guidance  from ultrasound and flouroscopy;  Surgeon: Grace Isaac, MD;  Location: [facility];  Service: Thoracic;  Laterality: Left;  . TONSILLECTOMY    . VIDEO BRONCHOSCOPY WITH ENDOBRONCHIAL ULTRASOUND N/A 03/30/2016   Procedure: VIDEO BRONCHOSCOPY WITH ENDOBRONCHIAL ULTRASOUND,WITH TRANSBRONCHIAL BIOPSY;  Surgeon: Grace Isaac, MD;  Location: [facility];  Service: Thoracic;  Laterality: N/A;    There were no vitals filed for this visit.      Subjective Assessment - 12/05/16 1240    Subjective No new complaints. Had a "partial fall" going up outside steps. Has skin wound on shin of left leg, covered with band aide. Reports it's scabbed over. His spouse is Therapist, sports and monitoring for infection.    Pertinent History lymphoma with chemo, HOH   Limitations Walking;Other (comment)   Patient Stated Goals Get back to normal function.     Currently in Pain? No/denies            [facility] PT Assessment - 12/05/16 1243      Functional Gait  Assessment   Gait assessed  Yes   Gait Level Surface Walks 20 ft in less than 7 sec but greater than 5.5 sec, uses assistive device, slower speed, mild gait deviations, or deviates 6-10 in outside of the 12 in walkway width.   Change in Gait Speed Able to change speed, demonstrates mild gait  deviations, deviates 6-10 in outside of the 12 in walkway width, or no gait deviations, unable to achieve a major change in velocity, or uses a change in velocity, or uses an assistive device.   Gait with Horizontal Head Turns Performs head turns smoothly with slight change in gait velocity (eg, minor disruption to smooth gait path), deviates 6-10 in outside 12 in walkway width, or uses an assistive device.   Gait with Vertical Head Turns Performs task with slight change in gait velocity (eg, minor disruption to smooth gait path), deviates 6 - 10 in outside 12 in walkway width or uses assistive device   Gait and Pivot Turn Pivot turns safely within 3 sec and stops quickly with no loss of  balance.   Step Over Obstacle Is able to step over one shoe box (4.5 in total height) without changing gait speed. No evidence of imbalance.   Gait with Narrow Base of Support Ambulates less than 4 steps heel to toe or cannot perform without assistance.   Gait with Eyes Closed Walks 20 ft, slow speed, abnormal gait pattern, evidence for imbalance, deviates 10-15 in outside 12 in walkway width. Requires more than 9 sec to ambulate 20 ft.   Ambulating Backwards Walks 20 ft, uses assistive device, slower speed, mild gait deviations, deviates 6-10 in outside 12 in walkway width.   Steps Alternating feet, must use rail.   Total Score 18   FGA comment: 18/30= high risk for falls      issued the following to HEP today: Perform these at counter top in kitchen, hold for balance/safety. Slow and steady for safety.     "I love a Database administrator   At counter for balance as needed: high knee marching forward and then backward. 3 second pauses with each knee lift.  Repeat 3 laps each way. Do _1-2_ sessions per day. http://gt2.exer.us/345   Copyright  VHI. All rights reserved.  Walking on Toes   At counter for balance as needed: Walk on toes forward while continuing on a straight path, and then backwards on toes to starting position. Repeat 3 laps each way. Do _1-2___ sessions per day.  Copyright  VHI. All rights reserved.  Feet Heel-Toe "Tandem"   At counter: Arms at sides, walk a straight line forward bringing one foot directly in front of the other, and then a straight line backwards bringing one foot directly behind the other one.  Repeat for _3 laps each way. Do _1-2_ sessions per day.  Copyright  VHI. All rights reserved.         PT Education - 12/05/16 1316    Education provided Yes   Education Details HEP for balance at counter; results of FGA test   Person(s) Educated Patient   Methods Explanation;Demonstration;Verbal cues;Handout   Comprehension Verbalized understanding;Verbal  cues required;Need further instruction;Returned demonstration          PT Short Term Goals - 11/27/16 2052      PT SHORT TERM GOAL #1   Title (TARGET DATE FOR STG 12/19/16)  Pt will participate in FGA with LTG to be set   Status New     PT SHORT TERM GOAL #2   Title Pt will improve functional LE strength as indicated by decrease in 5x sit to stand score of <10 seconds   Baseline 12.32   Status New     PT SHORT TERM GOAL #3   Title Pt will improve safety with gait as indicated by improvement in gait  velocity to >3.5 ft/sec   Baseline 3.80ft/sec   Status New     PT SHORT TERM GOAL #4   Title Pt will negotiate 8 stairs, alternating sequence with no rail and supervision    Baseline min A without rail   Status New     PT SHORT TERM GOAL #5   Title Pt will be able to maintain 30 seconds on all conditions on MCTSIB to indicate improved use of all sensory systems   Baseline 18 seconds on condition 4           PT Long Term Goals - 11/27/16 2056      PT LONG TERM GOAL #1   Title (TARGET DATE FOR ALL LTG 01/09/17) Pt will demonstrate independence with LE strengthening and balance HEP   Status New     PT LONG TERM GOAL #2   Title Pt will improve FGA score to >25/30 to decrease falls risk    Baseline TBD   Status New     PT LONG TERM GOAL #3   Title Pt will improve safety with community gait as indicated by gait velocity of >9ft/sec   Status New     PT LONG TERM GOAL #4   Title Pt will ambulate >1000 over outdoor/uneven terrain and up/down curb at Mod I level and no evidence of LOB and improved foot clearance   Status New     PT LONG TERM GOAL #5   Title Pt will improve ABC score to >80% confidence   Baseline 64.4%   Status New           Plan - 12/05/16 1242    Clinical Impression Statement Today's skilled session established baseline score for FGA with primary PT to set goals. Remainder of session addressed establishment of HEP for balance at home. Pt is making  progress toward goals and should benefit from continued PT to progress toward unmet goals.   Rehab Potential Good   Clinical Impairments Affecting Rehab Potential lymphoma, chemo   PT Frequency 2x / week   PT Duration 8 weeks  likely will D/C between 4-6 weeks   PT Treatment/Interventions ADLs/Self Care Home Management;DME Instruction;Gait training;Stair training;Functional mobility training;Therapeutic activities;Therapeutic exercise;Balance training;Neuromuscular re-education;Patient/family education;Orthotic Fit/Training;Energy conservation   PT Next Visit Plan add corner balance to HEP; continued to work on LE strengthening and balance (vision deprived)   PT Home Exercise Plan 12/05/16: currently walking up to 30 minutes/30 minutes on Norditrack with resistance   Consulted and Agree with Plan of Care Patient;Family member/caregiver   Family Member Consulted Wife-Aggie      Patient will benefit from skilled therapeutic intervention in order to improve the following deficits and impairments:  Abnormal gait, Decreased activity tolerance, Decreased balance, Decreased endurance, Decreased strength, Difficulty walking, Impaired sensation  Visit Diagnosis: Muscle weakness (generalized)  Unsteadiness on feet  Other disturbances of skin sensation  Foot drop, left  Foot drop, right  Repeated falls  Difficulty in walking, not elsewhere classified     Problem List Patient Active Problem List   Diagnosis Date Noted  . Port catheter in place 11/06/2016  . Peripheral neuropathy ([place]) 09/11/2016  . Atypical pneumonia 06/18/2016  . Acute respiratory failure with hypoxia ([place]) 06/18/2016  . Fever 06/16/2016  . Lactic acidosis 06/16/2016  . Dehydration 06/16/2016  . Encounter for antineoplastic chemotherapy 04/17/2016  . NHL (non-Hodgkin's lymphoma) ([place]) 04/05/2016  . Diffuse large B-cell lymphoma of lymph nodes of multiple regions ([place]) 04/05/2016  . Lung mass 03/13/2016  .  Post-operative state 03/08/2014  . Left inguinal hernia 05/08/2013    Willow Ora, PTA, [facility] [address], [place], [place] [address] [phone] 12/05/16, 2:28 PM   Name: Seth Gilbert MRN: [id number] Date of Birth: 08/10/1936

## 2016-12-05 NOTE — Patient Instructions (Signed)
Perform these at counter top in kitchen, hold for balance/safety. Slow and steady for safety.     "I love a Database administrator   At counter for balance as needed: high knee marching forward and then backward. 3 second pauses with each knee lift.  Repeat 3 laps each way. Do _1-2_ sessions per day. http://gt2.exer.us/345   Copyright  VHI. All rights reserved.  Walking on Toes   At counter for balance as needed: Walk on toes forward while continuing on a straight path, and then backwards on toes to starting position. Repeat 3 laps each way. Do _1-2___ sessions per day.  Copyright  VHI. All rights reserved.  Feet Heel-Toe "Tandem"   At counter: Arms at sides, walk a straight line forward bringing one foot directly in front of the other, and then a straight line backwards bringing one foot directly behind the other one.  Repeat for _3 laps each way. Do _1-2_ sessions per day.  Copyright  VHI. All rights reserved.

## 2016-12-05 NOTE — Patient Instructions (Addendum)
  Coordination Activities  Perform the following activities for 20 minutes 1 times per day with both hand(s).   Rotate ball in fingertips (clockwise and counter-clockwise).  Toss ball between hands.  Toss ball in air and catch with the same hand.  Flip cards 1 at a time   Deal cards with your thumb (Hold deck in hand and push card off top with thumb).  Push cards off the top of the deck by extending fingers  Pick up coins and place in container or coin bank.  Pick up coins and stack.  Pick up coins one at a time until you get 5-10 in your hand, then move coins from palm to fingertips to stack one at a time.  Screw together nuts and bolts, then unfasten(small is preferable) 1. Grip Strengthening (Resistive Putty)   Squeeze putty using thumb and all fingers.red puttyRepeat _20___ times. Do __2__ sessions per day.   2. Roll putty into tube on table and pinch between each finger and thumb x 10 reps each. (can do ring and small finger together) yellow     Copyright  VHI. All rights reserved.

## 2016-12-05 NOTE — Therapy (Signed)
[facility] [address] [place], [place], [address] Phone: [phone]   Fax:  [phone]  Occupational Therapy Treatment  Patient Details  Name: Seth Gilbert MRN: [id number] Date of Birth: [birth year] Referring Provider: Dr. Jaynee Eagles  Encounter Date: 12/05/2016      OT End of Session - 12/05/16 1413    Visit Number 2   Number of Visits 17   Authorization - Visit Number 2   Authorization - Number of Visits 10   OT Start Time 9702   OT Stop Time 1400   OT Time Calculation (min) 42 min   Activity Tolerance Patient tolerated treatment well   Behavior During Therapy [facility] for tasks assessed/performed      Past Medical History:  Diagnosis Date  . Cancer ([place])    NHL  . Diverticulitis   . GERD (gastroesophageal reflux disease)    Heartburn at times  . Heart murmur    "comes and goes" onset 4th grade - last time anyone heard it was 01/2014  . History of hiatal hernia   . History of kidney stones   . HOH (hard of hearing)   . Non-Hodgkin lymphoma ([place])   . Peripheral neuropathy ([place]) 09/11/2016  . PONV (postoperative nausea and vomiting)   . Rosacea   . Wears glasses     Past Surgical History:  Procedure Laterality Date  . APPENDECTOMY     2-3 yrs old  . COLONOSCOPY    . HEMORROIDECTOMY     inflammed anal gland removed  . INGUINAL HERNIA REPAIR Left 02/11/2014   Procedure: LEFT INGUINAL HERNIA REPAIR WITH MESH ;  Surgeon: Joyice Faster. Cornett, MD;  Location: [facility];  Service: General;  Laterality: Left;  . INSERTION OF MESH N/A 02/11/2014   Procedure: INSERTION OF MESH;  Surgeon: Joyice Faster. Cornett, MD;  Location: [facility];  Service: General;  Laterality: N/A;  . PORTACATH PLACEMENT Left 04/10/2016   Procedure: INSERTION PORT-A-CATH into LEFT Subclavian Vein with guidance from ultrasound and flouroscopy;  Surgeon: Grace Isaac, MD;  Location: [facility];  Service: Thoracic;   Laterality: Left;  . TONSILLECTOMY    . VIDEO BRONCHOSCOPY WITH ENDOBRONCHIAL ULTRASOUND N/A 03/30/2016   Procedure: VIDEO BRONCHOSCOPY WITH ENDOBRONCHIAL ULTRASOUND,WITH TRANSBRONCHIAL BIOPSY;  Surgeon: Grace Isaac, MD;  Location: [facility];  Service: Thoracic;  Laterality: N/A;    There were no vitals filed for this visit.                            OT Education - 12/05/16 1410    Education provided Yes   Education Details coordination and putty HEP   Person(s) Educated Patient   Methods Explanation;Demonstration;Verbal cues;Handout   Comprehension Verbalized understanding;Returned demonstration          OT Short Term Goals - 11/28/16 1637      OT SHORT TERM GOAL #1   Title I with HEP- due 12/26/16   Time 4   Period Weeks   Status New     OT SHORT TERM GOAL #2   Title Pt will verbalize understanding of adapted strategies for ADLs/IADLS and sensory precautions.   Time 4   Period Weeks   Status New     OT SHORT TERM GOAL #3   Title Test 3 button/ unbutton and write goal prn   Time 4   Period Weeks   Status New  OT SHORT TERM GOAL #4   Title Test standing functional reach and set goal prn   Time 4   Period Weeks   Status New           OT Long Term Goals - 11/28/16 1651      OT LONG TERM GOAL #1   Title Pt will perform mod complex home management activities at a modified independent level .-01/25/17   Time 8   Period Weeks   Status New     OT LONG TERM GOAL #2   Title Pt will demonstrate improved fine motor coordination for ADLs/IADLS as  evidenced by decreasing bilateral 9 hole peg test score by 3 secs.   Time 8   Period Weeks   Status New     OT LONG TERM GOAL #3   Title Pt will increase LUE grip strength to 45 lbs or greater for increased ease with IADLs.   Time 8   Period Weeks   Status New     OT LONG TERM GOAL #4   Title Pt will demonstrate improved bilateral strength and functional use as evidenced by retrieving a 3  lbs weight at 110 shoulder flexion for right UE  and left UE   Time 8   Period Weeks   Status New               Plan - 12/05/16 1410    Clinical Impression Statement Pt is progressing towards goals. He verbalizes understanding of HEP for coordination/putty HEP.   Rehab Potential Good   OT Frequency 2x / week   OT Duration 8 weeks   OT Treatment/Interventions Self-care/ADL training;Moist Heat;Fluidtherapy;DME and/or AE instruction;Patient/family education;Balance training;Therapeutic exercises;Ultrasound;Therapeutic exercise;Therapeutic activities;Passive range of motion;Functional Mobility Training;Neuromuscular education;Cryotherapy;Electrical Stimulation;Parrafin;Energy conservation;Manual Therapy   Plan  test 3 button/ unbutton, standing functional reach,  address fine motor coordination, pinch strength   Consulted and Agree with Plan of Care Patient      Patient will benefit from skilled therapeutic intervention in order to improve the following deficits and impairments:  Abnormal gait, Pain, Impaired sensation, Decreased coordination, Decreased mobility, Decreased endurance, Decreased range of motion, Decreased strength, Impaired tone, Impaired UE functional use, Difficulty walking, Decreased safety awareness, Decreased knowledge of precautions, Decreased balance  Visit Diagnosis: Muscle weakness (generalized)  Unsteadiness on feet  Other lack of coordination    Problem List Patient Active Problem List   Diagnosis Date Noted  . Port catheter in place 11/06/2016  . Peripheral neuropathy ([place]) 09/11/2016  . Atypical pneumonia 06/18/2016  . Acute respiratory failure with hypoxia ([place]) 06/18/2016  . Fever 06/16/2016  . Lactic acidosis 06/16/2016  . Dehydration 06/16/2016  . Encounter for antineoplastic chemotherapy 04/17/2016  . NHL (non-Hodgkin's lymphoma) ([place]) 04/05/2016  . Diffuse large B-cell lymphoma of lymph nodes of multiple regions ([place]) 04/05/2016  . Lung  mass 03/13/2016  . Post-operative state 03/08/2014  . Left inguinal hernia 05/08/2013    RINE,KATHRYN 12/05/2016, 2:14 PM Theone Murdoch, OTR/L [phone] Phone: [phone] 2:15 PM 12/05/16 [facility] [address] [place], [place], [address] Phone: [phone]   Fax:  [phone]  Name: Seth Gilbert MRN: [id number] Date of Birth: 1935-10-10

## 2016-12-06 ENCOUNTER — Encounter: Payer: Self-pay | Admitting: Physical Therapy

## 2016-12-06 ENCOUNTER — Ambulatory Visit: Payer: Medicare Other | Admitting: Physical Therapy

## 2016-12-06 DIAGNOSIS — M6281 Muscle weakness (generalized): Secondary | ICD-10-CM

## 2016-12-06 DIAGNOSIS — R2681 Unsteadiness on feet: Secondary | ICD-10-CM

## 2016-12-06 DIAGNOSIS — M21372 Foot drop, left foot: Secondary | ICD-10-CM | POA: Diagnosis not present

## 2016-12-06 DIAGNOSIS — R208 Other disturbances of skin sensation: Secondary | ICD-10-CM

## 2016-12-06 DIAGNOSIS — R296 Repeated falls: Secondary | ICD-10-CM

## 2016-12-06 DIAGNOSIS — M21371 Foot drop, right foot: Secondary | ICD-10-CM

## 2016-12-06 NOTE — Patient Instructions (Addendum)
WALKING  Walking is a great form of exercise to increase your strength, endurance and overall fitness.  A walking program can help you start slowly and gradually build endurance as you go.  Everyone's ability is different, so each person's starting point will be different.  You do not have to follow them exactly.  The are just samples. You should simply find out what's right for you and stick to that program.   In the beginning, you'll start off walking 2-3 times a day for short distances.  As you get stronger, you'll be walking further at just 1-2 times per day.  A. You Can Walk For A Certain Length Of Time Each Day    Walk 5 minutes 1-2 times per day- only on non therapy days and go by how you feel (fatigue level)  Increase 1-2 minutes when the current time you are walking starts to feel easy.  Work up to 25-30 minutes (1-2 times per day).   Example:   Day 1-2 5 minutes 1-2 times per day   Day 7-8 6-7 minutes 1-2 times per day   Day 13-14 7-9 minutes 1-2 times per day  Perform these in the corner with a chair in front of you for safety:   Feet Together (Compliant Surface) Varied Arm Positions - Eyes Closed    Stand on compliant surface: _pillow/s_ with feet together and arms as needed for balance Close eyes and visualize upright position. Hold__30__ seconds. Repeat _3_ times per session. Do 1-2_ sessions per day.  Copyright  VHI. All rights reserved.    Feet Apart (Compliant Surface) Head Motion - Eyes Closed    Stand on compliant surface: _pillow/s__ with feet shoulder width apart. Close eyes and move head slowly: 1. Up and down x 10 reps 2. Left and right x 10 reps 3. Diagonal directions x 10 reps each way.  Do _1-2__ sessions per day.  Copyright  VHI. All rights reserved.

## 2016-12-07 NOTE — Therapy (Signed)
[facility] [address] [place], [place], [address] Phone: [phone]   Fax:  [phone]  Physical Therapy Treatment  Patient Details  Name: ASER NYLUND MRN: [id number] Date of Birth: 23-Mar-1936 Referring Provider: Melvenia Beam, MD  Encounter Date: 12/06/2016      PT End of Session - 12/06/16 1323    Visit Number 3   Number of Visits 17   Date for PT Re-Evaluation 01/26/17   Authorization Type Blue Cross Blue Shield [place] code and progress note 10th visit   PT Start Time 1320   PT Stop Time 1400   PT Time Calculation (min) 40 min   Equipment Utilized During Treatment Gait belt   Activity Tolerance Patient tolerated treatment well   Behavior During Therapy [facility] for tasks assessed/performed      Past Medical History:  Diagnosis Date  . Cancer ([place])    NHL  . Diverticulitis   . GERD (gastroesophageal reflux disease)    Heartburn at times  . Heart murmur    "comes and goes" onset 4th grade - last time anyone heard it was 01/2014  . History of hiatal hernia   . History of kidney stones   . HOH (hard of hearing)   . Non-Hodgkin lymphoma ([place])   . Peripheral neuropathy ([place]) 09/11/2016  . PONV (postoperative nausea and vomiting)   . Rosacea   . Wears glasses     Past Surgical History:  Procedure Laterality Date  . APPENDECTOMY     2-3 yrs old  . COLONOSCOPY    . HEMORROIDECTOMY     inflammed anal gland removed  . INGUINAL HERNIA REPAIR Left 02/11/2014   Procedure: LEFT INGUINAL HERNIA REPAIR WITH MESH ;  Surgeon: Joyice Faster. Cornett, MD;  Location: [facility];  Service: General;  Laterality: Left;  . INSERTION OF MESH N/A 02/11/2014   Procedure: INSERTION OF MESH;  Surgeon: Joyice Faster. Cornett, MD;  Location: [place] [facility];  Service: General;  Laterality: N/A;  . PORTACATH PLACEMENT Left 04/10/2016   Procedure: INSERTION PORT-A-CATH into LEFT Subclavian Vein with guidance  from ultrasound and flouroscopy;  Surgeon: Grace Isaac, MD;  Location: [facility];  Service: Thoracic;  Laterality: Left;  . TONSILLECTOMY    . VIDEO BRONCHOSCOPY WITH ENDOBRONCHIAL ULTRASOUND N/A 03/30/2016   Procedure: VIDEO BRONCHOSCOPY WITH ENDOBRONCHIAL ULTRASOUND,WITH TRANSBRONCHIAL BIOPSY;  Surgeon: Grace Isaac, MD;  Location: [facility];  Service: Thoracic;  Laterality: N/A;    There were no vitals filed for this visit.      Subjective Assessment - 12/06/16 1322    Subjective No new complaints. No falls or pain to report. Having issues with toe walking portion of HEP.    Patient is accompained by: Family member   Pertinent History lymphoma with chemo, HOH   Limitations Walking;Other (comment)   Patient Stated Goals Get back to normal function.     Currently in Pain? No/denies            [facility] Adult PT Treatment/Exercise - 12/06/16 1400      Transfers   Transfers Sit to Stand;Stand to Sit     Ambulation/Gait   Ambulation/Gait Yes   Ambulation/Gait Assistance 5: Supervision;6: Modified independent (Device/Increase time)   Ambulation Distance (Feet) 500 Feet   Assistive device None   Gait Pattern Step-through pattern;Decreased dorsiflexion - right;Decreased dorsiflexion - left;Poor foot clearance - left;Poor foot clearance - right   Ambulation Surface Level;Unlevel;Indoor;Outdoor;Paved  added the following to HEP: WALKING  Walking is a great form of exercise to increase your strength, endurance and overall fitness.  A walking program can help you start slowly and gradually build endurance as you go.  Everyone's ability is different, so each person's starting point will be different.  You do not have to follow them exactly.  The are just samples. You should simply find out what's right for you and stick to that program.   In the beginning, you'll start off walking 2-3 times a day for short distances.  As you get stronger, you'll be walking further at just 1-2 times per  day.  A. You Can Walk For A Certain Length Of Time Each Day    Walk 5 minutes 1-2 times per day- only on non therapy days and go by how you feel (fatigue level)  Increase 1-2 minutes when the current time you are walking starts to feel easy.  Work up to 25-30 minutes (1-2 times per day).   Example:   Day 1-2 5 minutes 1-2 times per day   Day 7-8 6-7 minutes 1-2 times per day   Day 13-14 7-9 minutes 1-2 times per day  Perform these in the corner with a chair in front of you for safety:   Feet Together (Compliant Surface) Varied Arm Positions - Eyes Closed    Stand on compliant surface: _pillow/s_ with feet together and arms as needed for balance Close eyes and visualize upright position. Hold__30__ seconds. Repeat _3_ times per session. Do 1-2_ sessions per day.  Copyright  VHI. All rights reserved.    Feet Apart (Compliant Surface) Head Motion - Eyes Closed    Stand on compliant surface: _pillow/s__ with feet shoulder width apart. Close eyes and move head slowly: 1. Up and down x 10 reps 2. Left and right x 10 reps 3. Diagonal directions x 10 reps each way.  Do _1-2__ sessions per day.  Copyright  VHI. All rights reserved.          PT Education - 12/06/16 1359    Education provided Yes   Education Details walking program and corner balance added to HEP   Person(s) Educated Patient   Methods Explanation;Demonstration;Verbal cues;Handout   Comprehension Verbalized understanding;Returned demonstration;Verbal cues required;Need further instruction          PT Short Term Goals - 12/05/16 1552      PT SHORT TERM GOAL #1   Title (TARGET DATE FOR STG 12/19/16)  Pt will participate in FGA with LTG to be set   Status Achieved     PT SHORT TERM GOAL #2   Title Pt will improve functional LE strength as indicated by decrease in 5x sit to stand score of <10 seconds   Baseline 12.32   Status New     PT SHORT TERM GOAL #3   Title Pt will improve safety with gait as  indicated by improvement in gait velocity to >3.5 ft/sec   Baseline 3.60f/sec   Status New     PT SHORT TERM GOAL #4   Title Pt will negotiate 8 stairs, alternating sequence with no rail and supervision    Baseline min A without rail   Status New     PT SHORT TERM GOAL #5   Title Pt will be able to maintain 30 seconds on all conditions on MCTSIB to indicate improved use of all sensory systems   Baseline 18 seconds on condition 4  PT Long Term Goals - 12/05/16 1552      PT LONG TERM GOAL #1   Title (TARGET DATE FOR ALL LTG 01/09/17) Pt will demonstrate independence with LE strengthening and balance HEP   Status New     PT LONG TERM GOAL #2   Title Pt will improve FGA score to >23/30 to decrease falls risk    Baseline 18/30 on 12/05/16   Status Revised     PT LONG TERM GOAL #3   Title Pt will improve safety with community gait as indicated by gait velocity of >24f/sec   Status New     PT LONG TERM GOAL #4   Title Pt will ambulate >1000 over outdoor/uneven terrain and up/down curb at Mod I level and no evidence of LOB and improved foot clearance   Status New     PT LONG TERM GOAL #5   Title Pt will improve ABC score to >80% confidence   Baseline 64.4%   Status New            Plan - 12/06/16 1323    Clinical Impression Statement Today's skilled session continued to focus on home program. Added walking program to HEP as pt reports he used to walk for up to 1 hour a day and wants to return to this. Also added corner balance activities to HEP to address balance reactions. Pt is making steady progress toward goals and should benefit from continued PT to progress toward goals not met.    Rehab Potential Good   Clinical Impairments Affecting Rehab Potential lymphoma, chemo   PT Frequency 2x / week   PT Duration 8 weeks  likely will D/C between 4-6 weeks   PT Treatment/Interventions ADLs/Self Care Home Management;DME Instruction;Gait training;Stair  training;Functional mobility training;Therapeutic activities;Therapeutic exercise;Balance training;Neuromuscular re-education;Patient/family education;Orthotic Fit/Training;Energy conservation   PT Next Visit Plan continued to work on LE strengthening/activity tolerance and balance (vision deprived), emphasis on complaint surfaces   PT Home Exercise Plan 12/05/16: currently walking up to 30 minutes/30 minutes on Norditrack with resistance   Consulted and Agree with Plan of Care Patient;Family member/caregiver   Family Member Consulted Wife-Aggie      Patient will benefit from skilled therapeutic intervention in order to improve the following deficits and impairments:  Abnormal gait, Decreased activity tolerance, Decreased balance, Decreased endurance, Decreased strength, Difficulty walking, Impaired sensation  Visit Diagnosis: Muscle weakness (generalized)  Unsteadiness on feet  Other disturbances of skin sensation  Foot drop, left  Repeated falls  Foot drop, right     Problem List Patient Active Problem List   Diagnosis Date Noted  . Port catheter in place 11/06/2016  . [place]ipheral [place] ([place] 09/11/2016  . Atypical pneumonia 06/18/2016  . Acute respiratory failure with hypoxia ([place] 06/18/2016  . Fever 06/16/2016  . Lactic acidosis 06/16/2016  . Dehydration 06/16/2016  . Encounter for antineoplastic chemotherapy 04/17/2016  . NHL (non-Hodgkin's lymphoma) ([place] 04/05/2016  . Diffuse large B-cell lymphoma of lymph nodes of multiple regions ([place] 04/05/2016  . Lung mass 03/13/2016  . Post-operative state 03/08/2014  . Left inguinal hernia 05/08/2013    KWillow Ora PTA, CL[facility] HospitalOutpatient Neuro RK[facility] Center[address] [place]ScottvilleGEastborough [place] [phone]/13/18, 5:44 PM   Name: RJOSHUS ROGANMRN: [id number] of Birth: [birth year]

## 2016-12-12 ENCOUNTER — Ambulatory Visit: Payer: Medicare Other | Admitting: Physical Therapy

## 2016-12-12 ENCOUNTER — Ambulatory Visit: Payer: Medicare Other | Admitting: Occupational Therapy

## 2016-12-12 ENCOUNTER — Encounter: Payer: Self-pay | Admitting: Physical Therapy

## 2016-12-12 DIAGNOSIS — M6281 Muscle weakness (generalized): Secondary | ICD-10-CM

## 2016-12-12 DIAGNOSIS — R278 Other lack of coordination: Secondary | ICD-10-CM

## 2016-12-12 DIAGNOSIS — M21372 Foot drop, left foot: Secondary | ICD-10-CM | POA: Diagnosis not present

## 2016-12-12 DIAGNOSIS — R2681 Unsteadiness on feet: Secondary | ICD-10-CM

## 2016-12-12 DIAGNOSIS — R208 Other disturbances of skin sensation: Secondary | ICD-10-CM

## 2016-12-12 NOTE — Therapy (Signed)
[facility] [address] [place], [place], [address] Phone: [phone]   Fax:  [phone]  Physical Therapy Treatment  Patient Details  Name: Seth [place] MRN: [id number] Date of Birth: 06-23-1936 Referring Provider: Melvenia Beam, MD  Encounter Date: 12/12/2016      PT End of Session - 12/12/16 1534    Visit Number 4   Number of Visits 17   Date for PT Re-Evaluation 01/26/17   Authorization Type Blue Cross Blue Shield [place] code and progress note 10th visit   PT Start Time 1532   PT Stop Time 1612   PT Time Calculation (min) 40 min   Equipment Utilized During Treatment Gait belt   Activity Tolerance Patient tolerated treatment well   Behavior During Therapy [facility] for tasks assessed/performed      Past Medical History:  Diagnosis Date  . Cancer ([place])    NHL  . Diverticulitis   . GERD (gastroesophageal reflux disease)    Heartburn at times  . Heart murmur    "comes and goes" onset 4th grade - last time anyone heard it was 01/2014  . History of hiatal hernia   . History of kidney stones   . HOH (hard of hearing)   . Non-Hodgkin lymphoma ([place])   . Peripheral neuropathy 09/11/2016  . PONV (postoperative nausea and vomiting)   . Rosacea   . Wears glasses     Past Surgical History:  Procedure Laterality Date  . APPENDECTOMY     2-3 yrs old  . COLONOSCOPY    . HEMORROIDECTOMY     inflammed anal gland removed  . INGUINAL HERNIA REPAIR Left 02/11/2014   Procedure: LEFT INGUINAL HERNIA REPAIR WITH MESH ;  Surgeon: Joyice Faster. Cornett, MD;  Location: [facility];  Service: General;  Laterality: Left;  . INSERTION OF MESH N/A 02/11/2014   Procedure: INSERTION OF MESH;  Surgeon: Joyice Faster. Cornett, MD;  Location: [facility];  Service: General;  Laterality: N/A;  . PORTACATH PLACEMENT Left 04/10/2016   Procedure: INSERTION PORT-A-CATH into LEFT Subclavian Vein with guidance from  ultrasound and flouroscopy;  Surgeon: Grace Isaac, MD;  Location: [facility];  Service: Thoracic;  Laterality: Left;  . TONSILLECTOMY    . VIDEO BRONCHOSCOPY WITH ENDOBRONCHIAL ULTRASOUND N/A 03/30/2016   Procedure: VIDEO BRONCHOSCOPY WITH ENDOBRONCHIAL ULTRASOUND,WITH TRANSBRONCHIAL BIOPSY;  Surgeon: Grace Isaac, MD;  Location: [place] [facility];  Service: Thoracic;  Laterality: N/A;    There were no vitals filed for this visit.      Subjective Assessment - 12/12/16 1532    Subjective No new complaints.    Patient is accompained by: Family member   Pertinent History lymphoma with chemo, HOH   Limitations Walking;Other (comment)   Patient Stated Goals Get back to normal function.     Currently in Pain? No/denies            Balance Exercises - 12/12/16 1536      Balance Exercises: Standing   SLS with Vectors Solid surface;Other reps (comment);Limitations   Rockerboard Anterior/posterior;Lateral;Head turns;EO;EC;30 seconds;10 reps   Balance Beam standing with feet across blue foam beam: alternating fwd stepping to floor and back onto beam x 10 reps each leg, then alternating bwd stepping to floor then back onto beam x 10 reps each leg, no UE support to light touch to bars for balance. cues for incr weight shifting with stepping. min to mod assist for balacne, with pt mostly have  posterior balance loss. seated with feet across blue foam beam: sit<>stands x 10 reps with emphasis on full upright posture and slow, controlled sitting. pt needed UE support and min assist for controlled descent with sitting down.                                Balance Exercises: Standing   SLS with Vectors Limitations foam bubbles in semi circle pattern around pt's feet in hip width stance: toe taps to each all the way across and then back with intermittent touch to floor between bubbles for balance catch. 2 full laps around performed with each leg with min to mod assist for balance. cues on weight shifting and  posture to assist with balance.                                 Rebounder Limitations performed both ways on balance board with no UE support to intermittent touch to bars to catch balance: EO rocking board with emphasis on tall posture with min gaurd to min assist for balance; holding board steady: EC no head movements, progressing to EC head movements up<>down, left<>right and diagonals both ways with min to mod assist for balance.                                               PT Short Term Goals - 12/05/16 1552      PT SHORT TERM GOAL #1   Title (TARGET DATE FOR STG 12/19/16)  Pt will participate in FGA with LTG to be set   Status Achieved     PT SHORT TERM GOAL #2   Title Pt will improve functional LE strength as indicated by decrease in 5x sit to stand score of <10 seconds   Baseline 12.32   Status New     PT SHORT TERM GOAL #3   Title Pt will improve safety with gait as indicated by improvement in gait velocity to >3.5 ft/sec   Baseline 3.77ft/sec   Status New     PT SHORT TERM GOAL #4   Title Pt will negotiate 8 stairs, alternating sequence with no rail and supervision    Baseline min A without rail   Status New     PT SHORT TERM GOAL #5   Title Pt will be able to maintain 30 seconds on all conditions on MCTSIB to indicate improved use of all sensory systems   Baseline 18 seconds on condition 4           PT Long Term Goals - 12/05/16 1552      PT LONG TERM GOAL #1   Title (TARGET DATE FOR ALL LTG 01/09/17) Pt will demonstrate independence with LE strengthening and balance HEP   Status New     PT LONG TERM GOAL #2   Title Pt will improve FGA score to >23/30 to decrease falls risk    Baseline 18/30 on 12/05/16   Status Revised     PT LONG TERM GOAL #3   Title Pt will improve safety with community gait as indicated by gait velocity of >26ft/sec   Status New     PT LONG TERM GOAL #4   Title Pt will ambulate >1000 over outdoor/uneven  terrain and up/down curb at  Mod I level and no evidence of LOB and improved foot clearance   Status New     PT LONG TERM GOAL #5   Title Pt will improve ABC score to >80% confidence   Baseline 64.4%   Status New           Plan - 12/12/16 1535    Clinical Impression Statement Today's skilled session focused on high level balance activites with no issues reported. Pt is making steady progress toward goals. Pt should benefit from continued PT to progress toward unmet goals.    Rehab Potential Good   Clinical Impairments Affecting Rehab Potential lymphoma, chemo   PT Frequency 2x / week   PT Duration 8 weeks  likely will D/C between 4-6 weeks   PT Treatment/Interventions ADLs/Self Care Home Management;DME Instruction;Gait training;Stair training;Functional mobility training;Therapeutic activities;Therapeutic exercise;Balance training;Neuromuscular re-education;Patient/family education;Orthotic Fit/Training;Energy conservation   PT Next Visit Plan continued to work on LE strengthening/activity tolerance and balance (vision deprived), emphasis on complaint surfaces and single leg stance   PT Home Exercise Plan 12/05/16: currently walking up to 30 minutes/30 minutes on Norditrack with resistance. pt has extensive program that he tracks with exercise sheets   Consulted and Agree with Plan of Care Patient;Family member/caregiver   Family Member Consulted Wife-Aggie      Patient will benefit from skilled therapeutic intervention in order to improve the following deficits and impairments:  Abnormal gait, Decreased activity tolerance, Decreased balance, Decreased endurance, Decreased strength, Difficulty walking, Impaired sensation  Visit Diagnosis: Muscle weakness (generalized)  Unsteadiness on feet  Other lack of coordination     Problem List Patient Active Problem List   Diagnosis Date Noted  . Port catheter in place 11/06/2016  . Peripheral neuropathy 09/11/2016  . Atypical pneumonia 06/18/2016  . Acute  respiratory failure with hypoxia ([place]) 06/18/2016  . Fever 06/16/2016  . Lactic acidosis 06/16/2016  . Dehydration 06/16/2016  . Encounter for antineoplastic chemotherapy 04/17/2016  . NHL (non-Hodgkin's lymphoma) ([place]) 04/05/2016  . Diffuse large B-cell lymphoma of lymph nodes of multiple regions ([place]) 04/05/2016  . Lung mass 03/13/2016  . Post-operative state 03/08/2014  . Left inguinal hernia 05/08/2013    Willow Ora, PTA, [facility] Outpatient Neuro [facility] [address] [place], [place] [address] [phone] 12/12/16, 4:32 PM   Name: Seth [place] MRN: [id number] Date of Birth: 04/19/1936

## 2016-12-13 NOTE — Therapy (Signed)
[facility] [address] [place], [place], [address] Phone: [phone]   Fax:  [phone]  Occupational Therapy Treatment  Patient Details  Name: Seth Gilbert MRN: [id number] Date of Birth: [birth year] Referring Provider: Dr. Jaynee Eagles  Encounter Date: 12/12/2016      OT End of Session - 12/13/16 1642    Visit Number 3   Number of Visits 17   Date for OT Re-Evaluation 01/25/17   Authorization Type Blue MCR   Authorization - Visit Number 3   Authorization - Number of Visits 10   OT Start Time 1450   OT Stop Time 1530   OT Time Calculation (min) 40 min   Activity Tolerance Patient tolerated treatment well   Behavior During Therapy [facility] for tasks assessed/performed      Past Medical History:  Diagnosis Date  . Cancer ([place])    NHL  . Diverticulitis   . GERD (gastroesophageal reflux disease)    Heartburn at times  . Heart murmur    "comes and goes" onset 4th grade - last time anyone heard it was 01/2014  . History of hiatal hernia   . History of kidney stones   . HOH (hard of hearing)   . Non-Hodgkin lymphoma ([place])   . Peripheral neuropathy 09/11/2016  . PONV (postoperative nausea and vomiting)   . Rosacea   . Wears glasses     Past Surgical History:  Procedure Laterality Date  . APPENDECTOMY     2-3 yrs old  . COLONOSCOPY    . HEMORROIDECTOMY     inflammed anal gland removed  . INGUINAL HERNIA REPAIR Left 02/11/2014   Procedure: LEFT INGUINAL HERNIA REPAIR WITH MESH ;  Surgeon: Joyice Faster. Cornett, MD;  Location: [facility];  Service: General;  Laterality: Left;  . INSERTION OF MESH N/A 02/11/2014   Procedure: INSERTION OF MESH;  Surgeon: Joyice Faster. Cornett, MD;  Location: [facility];  Service: General;  Laterality: N/A;  . PORTACATH PLACEMENT Left 04/10/2016   Procedure: INSERTION PORT-A-CATH into LEFT Subclavian Vein with guidance from ultrasound and flouroscopy;  Surgeon:  Grace Isaac, MD;  Location: [facility];  Service: Thoracic;  Laterality: Left;  . TONSILLECTOMY    . VIDEO BRONCHOSCOPY WITH ENDOBRONCHIAL ULTRASOUND N/A 03/30/2016   Procedure: VIDEO BRONCHOSCOPY WITH ENDOBRONCHIAL ULTRASOUND,WITH TRANSBRONCHIAL BIOPSY;  Surgeon: Grace Isaac, MD;  Location: [facility];  Service: Thoracic;  Laterality: N/A;    There were no vitals filed for this visit.      Subjective Assessment - 12/13/16 1636    Subjective  Pt reports he has been exercising a t home. Pt has an exercise log   Pertinent History  chemo induced peripheral neuropathy with decreased coordination and multiple falls. Pt's PMH: lymphoma and chemotherapy, hypothyroidism.   Patient Stated Goals improve coordination, strength   Currently in Pain? No/denies            Reviewed previous coordination and putty  HEP, min v.c. Then pt returned demonstration. Therapist instructed pt in how to block MP joint of thumb for improved finger thumb opposition. Putty exercise with yellow for thumb flexion MP joint, due to weakness. Consider splinting needs in the future.                   OT Education - 12/13/16 1646    Education provided Yes   Education Details HEP with updates   Person(s) Educated Patient   Methods Demonstration;Verbal cues;Explanation  Comprehension Verbalized understanding;Returned demonstration;Verbal cues required          OT Short Term Goals - 11/28/16 1637      OT SHORT TERM GOAL #1   Title I with HEP- due 12/26/16   Time 4   Period Weeks   Status New     OT SHORT TERM GOAL #2   Title Pt will verbalize understanding of adapted strategies for ADLs/IADLS and sensory precautions.   Time 4   Period Weeks   Status New     OT SHORT TERM GOAL #3   Title Test 3 button/ unbutton and write goal prn   Time 4   Period Weeks   Status New     OT SHORT TERM GOAL #4   Title Test standing functional reach and set goal prn   Time 4   Period Weeks   Status  New           OT Long Term Goals - 11/28/16 1651      OT LONG TERM GOAL #1   Title Pt will perform mod complex home management activities at a modified independent level .-01/25/17   Time 8   Period Weeks   Status New     OT LONG TERM GOAL #2   Title Pt will demonstrate improved fine motor coordination for ADLs/IADLS as  evidenced by decreasing bilateral 9 hole peg test score by 3 secs.   Time 8   Period Weeks   Status New     OT LONG TERM GOAL #3   Title Pt will increase LUE grip strength to 45 lbs or greater for increased ease with IADLs.   Time 8   Period Weeks   Status New     OT LONG TERM GOAL #4   Title Pt will demonstrate improved bilateral strength and functional use as evidenced by retrieving a 3 lbs weight at 110 shoulder flexion for right UE  and left UE   Time 8   Period Weeks   Status New               Plan - 12/13/16 1637    Clinical Impression Statement Pt is progressing towards goals. He returned demonstration of HEP  and updates.   Rehab Potential Good   OT Frequency 2x / week   OT Duration 8 weeks   OT Treatment/Interventions Self-care/ADL training;Moist Heat;Fluidtherapy;DME and/or AE instruction;Patient/family education;Balance training;Therapeutic exercises;Ultrasound;Therapeutic exercise;Therapeutic activities;Passive range of motion;Functional Mobility Training;Neuromuscular education;Cryotherapy;Electrical Stimulation;Parrafin;Energy conservation;Manual Therapy   Plan test 3 button/ unbutton, set goal, standing functional reach   Consulted and Agree with Plan of Care Patient      Patient will benefit from skilled therapeutic intervention in order to improve the following deficits and impairments:  Abnormal gait, Pain, Impaired sensation, Decreased coordination, Decreased mobility, Decreased endurance, Decreased range of motion, Decreased strength, Impaired tone, Impaired UE functional use, Difficulty walking, Decreased safety awareness,  Decreased knowledge of precautions, Decreased balance  Visit Diagnosis: Muscle weakness (generalized)  Other disturbances of skin sensation  Other lack of coordination    Problem List Patient Active Problem List   Diagnosis Date Noted  . Port catheter in place 11/06/2016  . Peripheral neuropathy 09/11/2016  . Atypical pneumonia 06/18/2016  . Acute respiratory failure with hypoxia ([place]) 06/18/2016  . Fever 06/16/2016  . Lactic acidosis 06/16/2016  . Dehydration 06/16/2016  . Encounter for antineoplastic chemotherapy 04/17/2016  . NHL (non-Hodgkin's lymphoma) ([place]) 04/05/2016  . Diffuse large B-cell lymphoma of lymph nodes of  multiple regions ([place]) 04/05/2016  . Lung mass 03/13/2016  . Post-operative state 03/08/2014  . Left inguinal hernia 05/08/2013    RINE,KATHRYN 12/13/2016, 4:46 PM  [facility] [address] [place], [place], [address] Phone: [phone]   Fax:  [phone]  Name: YOUSUF AGER MRN: [id number] Date of Birth: April 18, 1936

## 2016-12-14 ENCOUNTER — Encounter: Payer: Self-pay | Admitting: Physical Therapy

## 2016-12-14 ENCOUNTER — Ambulatory Visit: Payer: Medicare Other | Admitting: Physical Therapy

## 2016-12-14 ENCOUNTER — Ambulatory Visit: Payer: Medicare Other | Admitting: Occupational Therapy

## 2016-12-14 DIAGNOSIS — R278 Other lack of coordination: Secondary | ICD-10-CM

## 2016-12-14 DIAGNOSIS — R2681 Unsteadiness on feet: Secondary | ICD-10-CM

## 2016-12-14 DIAGNOSIS — M21371 Foot drop, right foot: Secondary | ICD-10-CM

## 2016-12-14 DIAGNOSIS — R262 Difficulty in walking, not elsewhere classified: Secondary | ICD-10-CM

## 2016-12-14 DIAGNOSIS — M6281 Muscle weakness (generalized): Secondary | ICD-10-CM

## 2016-12-14 DIAGNOSIS — R296 Repeated falls: Secondary | ICD-10-CM

## 2016-12-14 DIAGNOSIS — M21372 Foot drop, left foot: Secondary | ICD-10-CM | POA: Diagnosis not present

## 2016-12-14 DIAGNOSIS — R208 Other disturbances of skin sensation: Secondary | ICD-10-CM

## 2016-12-14 NOTE — Therapy (Signed)
[facility] [address] [place], [place], [address] Phone: [phone]   Fax:  [phone]  Occupational Therapy Treatment  Patient Details  Name: Seth Gilbert MRN: [id number] Date of Birth: 1935/11/13 Referring Provider: Dr. Jaynee Eagles  Encounter Date: 12/14/2016      OT End of Session - 12/14/16 1543    Visit Number 4   Number of Visits 17   Date for OT Re-Evaluation 01/25/17   Authorization Type Blue MCR   Authorization - Visit Number 4   Authorization - Number of Visits 10   OT Start Time 3299   OT Stop Time 1530   OT Time Calculation (min) 42 min      Past Medical History:  Diagnosis Date  . Cancer ([place])    NHL  . Diverticulitis   . GERD (gastroesophageal reflux disease)    Heartburn at times  . Heart murmur    "comes and goes" onset 4th grade - last time anyone heard it was 01/2014  . History of hiatal hernia   . History of kidney stones   . HOH (hard of hearing)   . Non-Hodgkin lymphoma ([place])   . Peripheral neuropathy 09/11/2016  . PONV (postoperative nausea and vomiting)   . Rosacea   . Wears glasses     Past Surgical History:  Procedure Laterality Date  . APPENDECTOMY     2-3 yrs old  . COLONOSCOPY    . HEMORROIDECTOMY     inflammed anal gland removed  . INGUINAL HERNIA REPAIR Left 02/11/2014   Procedure: LEFT INGUINAL HERNIA REPAIR WITH MESH ;  Surgeon: Joyice Faster. Cornett, MD;  Location: [facility];  Service: General;  Laterality: Left;  . INSERTION OF MESH N/A 02/11/2014   Procedure: INSERTION OF MESH;  Surgeon: Joyice Faster. Cornett, MD;  Location: [facility];  Service: General;  Laterality: N/A;  . PORTACATH PLACEMENT Left 04/10/2016   Procedure: INSERTION PORT-A-CATH into LEFT Subclavian Vein with guidance from ultrasound and flouroscopy;  Surgeon: Grace Isaac, MD;  Location: [facility];  Service: Thoracic;  Laterality: Left;  . TONSILLECTOMY    . VIDEO  BRONCHOSCOPY WITH ENDOBRONCHIAL ULTRASOUND N/A 03/30/2016   Procedure: VIDEO BRONCHOSCOPY WITH ENDOBRONCHIAL ULTRASOUND,WITH TRANSBRONCHIAL BIOPSY;  Surgeon: Grace Isaac, MD;  Location: [facility];  Service: Thoracic;  Laterality: N/A;    There were no vitals filed for this visit.      Subjective Assessment - 12/13/16 1636    Subjective  Pt reports he has been exercising a t home. Pt has an exercise log   Pertinent History  chemo induced peripheral neuropathy with decreased coordination and multiple falls. Pt's PMH: lymphoma and chemotherapy, hypothyroidism.   Patient Stated Goals improve coordination, strength   Currently in Pain? No/denies                        Arm bike x 6 mins level 7 for conditioning. Placing removing graded clothespins from vertical antennae with  RUE using lateral and tip pinch, min v.c.      OT Education - 12/14/16 1525    Education provided Yes   Education Details putty exercise for finger adduction for 5th digit, issued green  theraband HEP- 15 reps each see pt instructions,button hook use, reviewed how to support thumb for improved finger thumb opposition   Person(s) Educated Patient   Methods Explanation;Demonstration;Verbal cues;Handout   Comprehension Verbalized understanding;Returned demonstration;Verbal cues required  OT Short Term Goals - 12/14/16 1457      OT SHORT TERM GOAL #1   Title I with HEP- due 12/26/16   Time 4   Period Weeks   Status New     OT SHORT TERM GOAL #2   Title Pt will verbalize understanding of adapted strategies for ADLs/IADLS and sensory precautions.   Time 4   Period Weeks   Status New     OT SHORT TERM GOAL #3   Title Pt will demonstrate improved ADL performance as evidenced by  decreasing 3 button/ unbutton time to 30 secs or less.   Baseline 35.50 secs   Time 4   Period Weeks   Status New     OT SHORT TERM GOAL #4   Title Pt will increase bilateral standing functional reach to  greater than or equal to 10 inches in order to decrease fall risk and increase confidence for ADLs.   Baseline right 9, left 9.5   Time 4   Period Weeks   Status New           OT Long Term Goals - 11/28/16 1651      OT LONG TERM GOAL #1   Title Pt will perform mod complex home management activities at a modified independent level .-01/25/17   Time 8   Period Weeks   Status New     OT LONG TERM GOAL #2   Title Pt will demonstrate improved fine motor coordination for ADLs/IADLS as  evidenced by decreasing bilateral 9 hole peg test score by 3 secs.   Time 8   Period Weeks   Status New     OT LONG TERM GOAL #3   Title Pt will increase LUE grip strength to 45 lbs or greater for increased ease with IADLs.   Time 8   Period Weeks   Status New     OT LONG TERM GOAL #4   Title Pt will demonstrate improved bilateral strength and functional use as evidenced by retrieving a 3 lbs weight at 110 shoulder flexion for right UE  and left UE   Time 8   Period Weeks   Status New               Plan - 12/14/16 1526    Clinical Impression Statement Pt is progressing toward goals. He demonstrates understanding of strengthening HEP with green band.   Rehab Potential Good   OT Duration 8 weeks   OT Treatment/Interventions Balance training   Plan check on HEP, fine motor coordination in standing   Consulted and Agree with Plan of Care Patient      Patient will benefit from skilled therapeutic intervention in order to improve the following deficits and impairments:  Abnormal gait, Pain, Impaired sensation, Decreased coordination, Decreased mobility, Decreased endurance, Decreased range of motion, Decreased strength, Impaired tone, Impaired UE functional use, Difficulty walking, Decreased safety awareness, Decreased knowledge of precautions, Decreased balance  Visit Diagnosis: Muscle weakness (generalized)  Unsteadiness on feet  Other lack of coordination  Other disturbances of  skin sensation    Problem List Patient Active Problem List   Diagnosis Date Noted  . Port catheter in place 11/06/2016  . Peripheral neuropathy 09/11/2016  . Atypical pneumonia 06/18/2016  . Acute respiratory failure with hypoxia ([place]) 06/18/2016  . Fever 06/16/2016  . Lactic acidosis 06/16/2016  . Dehydration 06/16/2016  . Encounter for antineoplastic chemotherapy 04/17/2016  . NHL (non-Hodgkin's lymphoma) ([place]) 04/05/2016  . Diffuse large  B-cell lymphoma of lymph nodes of multiple regions ([place]) 04/05/2016  . Lung mass 03/13/2016  . Post-operative state 03/08/2014  . Left inguinal hernia 05/08/2013    Maika Kaczmarek 12/14/2016, 3:47 PM  [facility] [address] [place], [place], [address] Phone: [phone]   Fax:  [phone]  Name: Seth Gilbert MRN: [id number] Date of Birth: 1936-05-19

## 2016-12-14 NOTE — Therapy (Signed)
[facility] [address] [place], [place], [address] Phone: [phone]   Fax:  [phone]  Physical Therapy Treatment  Patient Details  Name: Seth Gilbert MRN: [id number] Date of Birth: 05-Aug-1936 Referring Provider: Melvenia Beam, MD  Encounter Date: 12/14/2016      PT End of Session - 12/14/16 1518    Visit Number 5   Number of Visits 17   Date for PT Re-Evaluation 01/26/17   Authorization Type [employer] [place] code and progress note 10th visit   PT Start Time 1400   PT Stop Time 1445   PT Time Calculation (min) 45 min   Equipment Utilized During Treatment Gait belt   Activity Tolerance Patient tolerated treatment well   Behavior During Therapy [facility] for tasks assessed/performed      Past Medical History:  Diagnosis Date  . Cancer ([place])    NHL  . Diverticulitis   . GERD (gastroesophageal reflux disease)    Heartburn at times  . Heart murmur    "comes and goes" onset 4th grade - last time anyone heard it was 01/2014  . History of hiatal hernia   . History of kidney stones   . HOH (hard of hearing)   . Non-Hodgkin lymphoma ([place])   . Peripheral neuropathy 09/11/2016  . PONV (postoperative nausea and vomiting)   . Rosacea   . Wears glasses     Past Surgical History:  Procedure Laterality Date  . APPENDECTOMY     2-3 yrs old  . COLONOSCOPY    . HEMORROIDECTOMY     inflammed anal gland removed  . INGUINAL HERNIA REPAIR Left 02/11/2014   Procedure: LEFT INGUINAL HERNIA REPAIR WITH MESH ;  Surgeon: Joyice Faster. Cornett, MD;  Location: [facility];  Service: General;  Laterality: Left;  . INSERTION OF MESH N/A 02/11/2014   Procedure: INSERTION OF MESH;  Surgeon: Joyice Faster. Cornett, MD;  Location: [facility];  Service: General;  Laterality: N/A;  . PORTACATH PLACEMENT Left 04/10/2016   Procedure: INSERTION PORT-A-CATH into LEFT Subclavian Vein with guidance from  ultrasound and flouroscopy;  Surgeon: Grace Isaac, MD;  Location: [facility];  Service: Thoracic;  Laterality: Left;  . TONSILLECTOMY    . VIDEO BRONCHOSCOPY WITH ENDOBRONCHIAL ULTRASOUND N/A 03/30/2016   Procedure: VIDEO BRONCHOSCOPY WITH ENDOBRONCHIAL ULTRASOUND,WITH TRANSBRONCHIAL BIOPSY;  Surgeon: Grace Isaac, MD;  Location: [facility];  Service: Thoracic;  Laterality: N/A;    There were no vitals filed for this visit.      Subjective Assessment - 12/14/16 1404    Subjective No new complaints or falls to report.    Pertinent History lymphoma with chemo, HOH   Limitations Walking;Other (comment)   Currently in Pain? No/denies                         [facility] Adult PT Treatment/Exercise - 12/14/16 1400      Transfers   Transfers --     Ambulation/Gait   Ambulation/Gait Yes   Ambulation/Gait Assistance 5: Supervision;6: Modified independent (Device/Increase time)   Ambulation Distance (Feet) 125 Feet  additional ambulation on treadmill - see gait comments   Assistive device None   Gait Pattern Step-through pattern;Decreased dorsiflexion - right;Decreased dorsiflexion - left;Poor foot clearance - left;Poor foot clearance - right  greater trouble clearing L foot than R foot     Ambulation Surface Level;Indoor;Other (comment)  treadmil with 3% incline (  see gait comments for details)   Gait Comments Patient ambulated on treadmill at 1.2 m/hr at a 3% incline with patient holding on to treadmill with BUEs. Patient performed horizontal and vetical head turns. Progressed to ambulating while holding on with L hand only, then performed head turns. Patient unable to perform > 3 head turns with unilateral UE support due to reverting to a shortened step lengh making it difficult to clear his L foot. Patient requied cueing for step length, initial contact ("touch with your heel"), and maintaining posture during ambulation. Patient returns to shortened step length and poor foot  clearance (especially L foot) when performing head turns or unilateral UE support. Patient performed backwards ambulation on treadmill at 0.93m/hr speed. Patient required cueing and demonstration for proper sequencing and technique.     Balance   Balance Assessed Yes     Dynamic Standing Balance   Alternating foot traps comments: Patient performed alternating foot taps on 4 inch box with min A from PT. Patient requires bilateral UE support to maintain balance during SLS especially with LLE. Patient cued to tap with his heel on the step to encourage DF. Patient requires cueing for sequencing and technique.    Step ups comments: Patient performed step ups with a "march" on 4 inch step. Patient required mod A from PT and bilateral UE support to maintain balance especially when in SLS on LLE. Patient requires cueing for foot placement and sequencing.      High Level Balance   High Level Balance Comments Performed toe walking with mod A from PT. Requires cueing to maintain posture during activity. Performed modified heel walking with PT cueing initial contact with heel and sequencing.                 PT Education - 12/14/16 1516    Education provided Yes   Education Details Addition to HEP (modified heel walking at countertop)    Person(s) Educated Patient   Methods Explanation;Demonstration;Verbal cues;Handout  added written instructions for modified heel walking on current HEP handout   Comprehension Verbalized understanding;Returned demonstration          PT Short Term Goals - 12/05/16 1552      PT SHORT TERM GOAL #1   Title (TARGET DATE FOR STG 12/19/16)  Pt will participate in FGA with LTG to be set   Status Achieved     PT SHORT TERM GOAL #2   Title Pt will improve functional LE strength as indicated by decrease in 5x sit to stand score of <10 seconds   Baseline 12.32   Status New     PT SHORT TERM GOAL #3   Title Pt will improve safety with gait as indicated by  improvement in gait velocity to >3.5 ft/sec   Baseline 3.66ft/sec   Status New     PT SHORT TERM GOAL #4   Title Pt will negotiate 8 stairs, alternating sequence with no rail and supervision    Baseline min A without rail   Status New     PT SHORT TERM GOAL #5   Title Pt will be able to maintain 30 seconds on all conditions on MCTSIB to indicate improved use of all sensory systems   Baseline 18 seconds on condition 4           PT Long Term Goals - 12/05/16 1552      PT LONG TERM GOAL #1   Title (TARGET DATE FOR ALL LTG 01/09/17) Pt will demonstrate  independence with LE strengthening and balance HEP   Status New     PT LONG TERM GOAL #2   Title Pt will improve FGA score to >23/30 to decrease falls risk    Baseline 18/30 on 12/05/16   Status Revised     PT LONG TERM GOAL #3   Title Pt will improve safety with community gait as indicated by gait velocity of >66ft/sec   Status New     PT LONG TERM GOAL #4   Title Pt will ambulate >1000 over outdoor/uneven terrain and up/down curb at Mod I level and no evidence of LOB and improved foot clearance   Status New     PT LONG TERM GOAL #5   Title Pt will improve ABC score to >80% confidence   Baseline 64.4%   Status New               Plan - 12/14/16 1518    Clinical Impression Statement Today's session focused on progressing gait and balance activities. Performed treadmill walking with head turns/nods and unilateral UE support, which both caused patient to decreaes DF and step length requiring increased cueing from PT. Attempted retro walking on treadmill, but patient quickly fatigued (approximately 2 minutes). Advanced balance activities involving heep taps and steps ups to increase DF and work on SLS. Patient required cueing from PT to maintain proper form and technique. Patient is progressing towards goals, and will continue to benefit from skilled PT.   Rehab Potential Good   Clinical Impairments Affecting Rehab Potential  lymphoma, chemo   PT Frequency 2x / week   PT Duration 8 weeks  likely will D/C between 4-6 weeks   PT Treatment/Interventions ADLs/Self Care Home Management;DME Instruction;Gait training;Stair training;Functional mobility training;Therapeutic activities;Therapeutic exercise;Balance training;Neuromuscular re-education;Patient/family education;Orthotic Fit/Training;Energy conservation   PT Next Visit Plan advance balance activities with emphasis on SLS, strengthening exercises fostering foot clearance in gait    Consulted and Agree with Plan of Care Patient      Patient will benefit from skilled therapeutic intervention in order to improve the following deficits and impairments:  Abnormal gait, Decreased activity tolerance, Decreased balance, Decreased endurance, Decreased strength, Difficulty walking, Impaired sensation  Visit Diagnosis: Muscle weakness (generalized)  Unsteadiness on feet  Other lack of coordination  Foot drop, left  Repeated falls  Foot drop, right  Difficulty in walking, not elsewhere classified     Problem List Patient Active Problem List   Diagnosis Date Noted  . Port catheter in place 11/06/2016  . Peripheral neuropathy 09/11/2016  . Atypical pneumonia 06/18/2016  . Acute respiratory failure with hypoxia ([place]) 06/18/2016  . Fever 06/16/2016  . Lactic acidosis 06/16/2016  . Dehydration 06/16/2016  . Encounter for antineoplastic chemotherapy 04/17/2016  . NHL (non-Hodgkin's lymphoma) ([place]) 04/05/2016  . Diffuse large B-cell lymphoma of lymph nodes of multiple regions ([place]) 04/05/2016  . Lung mass 03/13/2016  . Post-operative state 03/08/2014  . Left inguinal hernia 05/08/2013    Arelia Sneddon, SPT  12/14/2016, 4:36 PM  [facility] [address] [place], [place], [address] Phone: [phone]   Fax:  [phone]  Name: Seth Gilbert MRN: [id number] Date of Birth: [birth year]

## 2016-12-14 NOTE — Patient Instructions (Signed)
  Strengthening: Resisted Flexion   Hold tubing with _each____ arm(s) at side. Pull forward and up. Move shoulder through pain-free range of motion. Repeat __15__ times per set.  Do _1-2_ sessions per day , every other day   Strengthening: Resisted Extension   Hold tubing in _each____ hand(s), arm forward. Pull arm back, elbow straight. Repeat _15__ times per set. Do _1-2___ sessions per day, every other day.     Elbow Flexion: Resisted   With tubing held in one hand(s) and other end secured under foot, curl arm up as far as possible. Repeat _15___ times per set. Do _1-2___ sessions per day, every other day.

## 2016-12-19 ENCOUNTER — Encounter: Payer: Self-pay | Admitting: Physical Therapy

## 2016-12-19 ENCOUNTER — Ambulatory Visit: Payer: Medicare Other | Admitting: Occupational Therapy

## 2016-12-19 ENCOUNTER — Ambulatory Visit: Payer: Medicare Other | Admitting: Physical Therapy

## 2016-12-19 DIAGNOSIS — R278 Other lack of coordination: Secondary | ICD-10-CM

## 2016-12-19 DIAGNOSIS — M6281 Muscle weakness (generalized): Secondary | ICD-10-CM

## 2016-12-19 DIAGNOSIS — M21372 Foot drop, left foot: Secondary | ICD-10-CM | POA: Diagnosis not present

## 2016-12-19 DIAGNOSIS — M21371 Foot drop, right foot: Secondary | ICD-10-CM

## 2016-12-19 DIAGNOSIS — R296 Repeated falls: Secondary | ICD-10-CM

## 2016-12-19 DIAGNOSIS — R2681 Unsteadiness on feet: Secondary | ICD-10-CM

## 2016-12-19 DIAGNOSIS — R262 Difficulty in walking, not elsewhere classified: Secondary | ICD-10-CM

## 2016-12-19 NOTE — Therapy (Signed)
[facility] [address] [place], [place], [address] Phone: [phone]   Fax:  [phone]  Occupational Therapy Treatment  Patient Details  Name: Seth Gilbert MRN: [id number] Date of Birth: Oct 24, 1935 Referring Provider: Dr. Jaynee Eagles  Encounter Date: 12/19/2016      OT End of Session - 12/19/16 1600    Visit Number 5   Number of Visits 17   Date for OT Re-Evaluation 01/25/17   Authorization Type Blue MCR   Authorization - Visit Number 5   Authorization - Number of Visits 10   OT Start Time 1694   OT Stop Time 1400   OT Time Calculation (min) 42 min   Activity Tolerance Patient tolerated treatment well   Behavior During Therapy [facility] for tasks assessed/performed      Past Medical History:  Diagnosis Date  . Cancer ([place])    NHL  . Diverticulitis   . GERD (gastroesophageal reflux disease)    Heartburn at times  . Heart murmur    "comes and goes" onset 4th grade - last time anyone heard it was 01/2014  . History of hiatal hernia   . History of kidney stones   . HOH (hard of hearing)   . Non-Hodgkin lymphoma ([place])   . Peripheral neuropathy 09/11/2016  . PONV (postoperative nausea and vomiting)   . Rosacea   . Wears glasses     Past Surgical History:  Procedure Laterality Date  . APPENDECTOMY     2-3 yrs old  . COLONOSCOPY    . HEMORROIDECTOMY     inflammed anal gland removed  . INGUINAL HERNIA REPAIR Left 02/11/2014   Procedure: LEFT INGUINAL HERNIA REPAIR WITH MESH ;  Surgeon: Joyice Faster. Cornett, MD;  Location: [place];  Service: General;  Laterality: Left;  . INSERTION OF MESH N/A 02/11/2014   Procedure: INSERTION OF MESH;  Surgeon: Joyice Faster. Cornett, MD;  Location: [place];  Service: General;  Laterality: N/A;  . PORTACATH PLACEMENT Left 04/10/2016   Procedure: INSERTION PORT-A-CATH into LEFT Subclavian Vein with guidance from ultrasound and flouroscopy;  Surgeon:  Grace Isaac, MD;  Location: [place];  Service: Thoracic;  Laterality: Left;  . TONSILLECTOMY    . VIDEO BRONCHOSCOPY WITH ENDOBRONCHIAL ULTRASOUND N/A 03/30/2016   Procedure: VIDEO BRONCHOSCOPY WITH ENDOBRONCHIAL ULTRASOUND,WITH TRANSBRONCHIAL BIOPSY;  Surgeon: Grace Isaac, MD;  Location: [place];  Service: Thoracic;  Laterality: N/A;    There were no vitals filed for this visit.      Subjective Assessment - 12/19/16 1559    Subjective  Pt reports new nail breakage   Pertinent History  chemo induced peripheral neuropathy with decreased coordination and multiple falls. Pt's PMH: lymphoma and chemotherapy, hypothyroidism.   Currently in Pain? No/denies          Treatment: arm bike x 6 mins level 5 for conditioning. Fine motor coordination task in standing to copy small peg design with bilateral UE's using transitional movements, standing on ground then non-compliant surface, close supervision, no LOB,  multiple drops due to impaired coordination. Gripper set at level 3  For LUE and 4 RUE to pick up 1 inch blocks,  For increased sustained grip, min difficulty/ drops.                 Balance Exercises - 12/19/16 1259      Balance Exercises: Standing   SLS Eyes open;Solid surface;Other reps (comment);Limitations   Wall Bumps Hip  Wall Bumps-Hips Eyes opened;Anterior/posterior;Foam/compliant surface;10 reps;Limitations     Balance Exercises: Standing   SLS Limitations at bottom of stairs: heel taps to bottom 3 steps up and then    Wall Bumps Limitations on airex:                standing with feet across red beam:              OT Short Term Goals - 12/14/16 1457      OT SHORT TERM GOAL #1   Title I with HEP- due 12/26/16   Time 4   Period Weeks   Status New     OT SHORT TERM GOAL #2   Title Pt will verbalize understanding of adapted strategies for ADLs/IADLS and sensory precautions.   Time 4   Period Weeks   Status New     OT SHORT TERM GOAL #3    Title Pt will demonstrate improved ADL performance as evidenced by  decreasing 3 button/ unbutton time to 30 secs or less.   Baseline 35.50 secs   Time 4   Period Weeks   Status New     OT SHORT TERM GOAL #4   Title Pt will increase bilateral standing functional reach to greater than or equal to 10 inches in order to decrease fall risk and increase confidence for ADLs.   Baseline right 9, left 9.5   Time 4   Period Weeks   Status New           OT Long Term Goals - 11/28/16 1651      OT LONG TERM GOAL #1   Title Pt will perform mod complex home management activities at a modified independent level .-01/25/17   Time 8   Period Weeks   Status New     OT LONG TERM GOAL #2   Title Pt will demonstrate improved fine motor coordination for ADLs/IADLS as  evidenced by decreasing bilateral 9 hole peg test score by 3 secs.   Time 8   Period Weeks   Status New     OT LONG TERM GOAL #3   Title Pt will increase LUE grip strength to 45 lbs or greater for increased ease with IADLs.   Time 8   Period Weeks   Status New     OT LONG TERM GOAL #4   Title Pt will demonstrate improved bilateral strength and functional use as evidenced by retrieving a 3 lbs weight at 110 shoulder flexion for right UE  and left UE   Time 8   Period Weeks   Status New               Plan - 12/19/16 1329    Clinical Impression Statement Pt is progressing towards goals. Pt reports recent changes to nails with increased breakage. Pt was encouraged to discuss with his MD as he recently started a new medication.   Rehab Potential Good   OT Frequency 2x / week   OT Duration 8 weeks   OT Treatment/Interventions Balance training;Self-care/ADL training;DME and/or AE instruction;Patient/family education;Therapeutic exercises;Therapeutic exercise;Therapeutic activities;Passive range of motion;Neuromuscular education;Functional Mobility Training;Manual Therapy;Electrical Stimulation;Parrafin;Energy  conservation;Cryotherapy;Moist Heat;Splinting   Plan continue to address, grip, pinch, coordination and standing balance   Consulted and Agree with Plan of Care Patient      Patient will benefit from skilled therapeutic intervention in order to improve the following deficits and impairments:  Abnormal gait, Pain, Impaired sensation, Decreased coordination, Decreased mobility, Decreased endurance, Decreased range  of motion, Decreased strength, Impaired tone, Impaired UE functional use, Difficulty walking, Decreased safety awareness, Decreased knowledge of precautions, Decreased balance  Visit Diagnosis: Muscle weakness (generalized)  Unsteadiness on feet  Other lack of coordination    Problem List Patient Active Problem List   Diagnosis Date Noted  . Port catheter in place 11/06/2016  . Peripheral neuropathy 09/11/2016  . Atypical pneumonia 06/18/2016  . Acute respiratory failure with hypoxia ([place]) 06/18/2016  . Fever 06/16/2016  . Lactic acidosis 06/16/2016  . Dehydration 06/16/2016  . Encounter for antineoplastic chemotherapy 04/17/2016  . NHL (non-Hodgkin's lymphoma) ([place]) 04/05/2016  . Diffuse large B-cell lymphoma of lymph nodes of multiple regions ([place]) 04/05/2016  . Lung mass 03/13/2016  . Post-operative state 03/08/2014  . Left inguinal hernia 05/08/2013    Mistey Hoffert 12/19/2016, 4:01 PM  [facility] [address] [place], [place], [address] Phone: [phone]   Fax:  [phone]  Name: DORANCE SPINK MRN: [id number] Date of Birth: [birth year]

## 2016-12-19 NOTE — Therapy (Signed)
[facility] [address] [place], [place], [address] Phone: [phone]   Fax:  [phone]  Physical Therapy Treatment  Patient Details  Name: Seth Gilbert MRN: [id number] Date of Birth: 1936-03-15 Referring Provider: Melvenia Beam, MD  Encounter Date: 12/19/2016      PT End of Session - 12/19/16 1237    Visit Number 6   Number of Visits 17   Date for PT Re-Evaluation 01/26/17   Authorization Type [employer] Cutter code and progress note 10th visit   PT Start Time 1236   PT Stop Time 1315   PT Time Calculation (min) 39 min   Equipment Utilized During Treatment Gait belt   Activity Tolerance Patient tolerated treatment well   Behavior During Therapy [facility] for tasks assessed/performed      Past Medical History:  Diagnosis Date  . Cancer ([place])    NHL  . Diverticulitis   . GERD (gastroesophageal reflux disease)    Heartburn at times  . Heart murmur    "comes and goes" onset 4th grade - last time anyone heard it was 01/2014  . History of hiatal hernia   . History of kidney stones   . HOH (hard of hearing)   . Non-Hodgkin lymphoma ([place])   . Peripheral neuropathy 09/11/2016  . PONV (postoperative nausea and vomiting)   . Rosacea   . Wears glasses     Past Surgical History:  Procedure Laterality Date  . APPENDECTOMY     2-3 yrs old  . COLONOSCOPY    . HEMORROIDECTOMY     inflammed anal gland removed  . INGUINAL HERNIA REPAIR Left 02/11/2014   Procedure: LEFT INGUINAL HERNIA REPAIR WITH MESH ;  Surgeon: Joyice Faster. Cornett, MD;  Location: [place];  Service: General;  Laterality: Left;  . INSERTION OF MESH N/A 02/11/2014   Procedure: INSERTION OF MESH;  Surgeon: Joyice Faster. Cornett, MD;  Location: [place];  Service: General;  Laterality: N/A;  . PORTACATH PLACEMENT Left 04/10/2016   Procedure: INSERTION PORT-A-CATH into LEFT Subclavian Vein with guidance from  ultrasound and flouroscopy;  Surgeon: Grace Isaac, MD;  Location: [place];  Service: Thoracic;  Laterality: Left;  . TONSILLECTOMY    . VIDEO BRONCHOSCOPY WITH ENDOBRONCHIAL ULTRASOUND N/A 03/30/2016   Procedure: VIDEO BRONCHOSCOPY WITH ENDOBRONCHIAL ULTRASOUND,WITH TRANSBRONCHIAL BIOPSY;  Surgeon: Grace Isaac, MD;  Location: [place];  Service: Thoracic;  Laterality: N/A;    There were no vitals filed for this visit.      Subjective Assessment - 12/19/16 1237    Subjective No new complaints or falls to report.    Patient is accompained by: Family member   Pertinent History lymphoma with chemo, HOH   Limitations Walking;Other (comment)   Patient Stated Goals Get back to normal function.     Currently in Pain? No/denies            [facility] PT Assessment - 12/19/16 1240      Transfers   Transfers Sit to Stand;Stand to Sit   Five time sit to stand comments  11.10 sec's with arms across chest     Ambulation/Gait   Ambulation/Gait Yes   Ambulation/Gait Assistance 5: Supervision   Ambulation Distance (Feet) 240 Feet   Assistive device None   Gait Pattern Step-through pattern;Decreased dorsiflexion - right;Decreased dorsiflexion - left;Poor foot clearance - left;Poor foot clearance - right   Ambulation Surface Level;Indoor   Gait velocity 10.12  sec= 3.24 ft/sec    Stairs Yes   Stairs Assistance 4: Min guard;5: Supervision   Stairs Assistance Details (indicate cue type and reason) supervision to ascend both times, min guard to supervision to descend both reps   Stair Management Technique No rails;Alternating pattern;Forwards   Number of Stairs 4  x 2 reps   Height of Stairs 6     Standardized Balance Assessment   Balance Master Testing Other/comments   Five times sit to stand comments  11.10   10 Meter Walk 3.24 ft/sec     High Level Balance   High Level Balance Comments MCTSIB: 30 seconds condition 1, 30 seconds condition 2, 30 seconds condition 3, 30 seconds condition 4               Balance Exercises - 12/19/16 1259      Balance Exercises: Standing   SLS Eyes open;Solid surface;Other reps (comment);Limitations   Wall Bumps Hip   Wall Bumps-Hips Eyes opened;Anterior/posterior;Foam/compliant surface;10 reps;Limitations     Balance Exercises: Standing   SLS Limitations at bottom of stairs: heel taps to bottom 3 steps up and then back down x 10 reps with each leg. single UE support. cues for posture and to lift the foot each time, not slide it. cues to maintain DF through out movements. min guard assist for balance.    Wall Bumps Limitations on airex: hip wall bumps in blocked practice working on correct form and technique. progressed to standing with feet across red beam: min guard assist with continued cues on correct form and technique.               PT Short Term Goals - 12/19/16 1238      PT SHORT TERM GOAL #1   Title (TARGET DATE FOR STG 12/19/16)  Pt will participate in FGA with LTG to be set   Status Achieved     PT SHORT TERM GOAL #2   Title Pt will improve functional LE strength as indicated by decrease in 5x sit to stand score of <10 seconds   Baseline 12/19/16: 11.10 sec's today, improved from 12.32 sec's at eval   Status Partially Met     PT SHORT TERM GOAL #3   Title Pt will improve safety with gait as indicated by improvement in gait velocity to >3.5 ft/sec   Baseline 12/19/16: 3.24 ft/sec today, improved from 3.07 ft/sec at eval   Status Partially Met     PT SHORT TERM GOAL #4   Title Pt will negotiate 8 stairs, alternating sequence with no rail and supervision    Baseline 12/19/16: met today   Status Achieved     PT SHORT TERM GOAL #5   Title Pt will be able to maintain 30 seconds on all conditions on MCTSIB to indicate improved use of all sensory systems   Baseline 12/19/16: met today   Status Achieved           PT Long Term Goals - 12/05/16 1552      PT LONG TERM GOAL #1   Title (TARGET DATE FOR ALL LTG  01/09/17) Pt will demonstrate independence with LE strengthening and balance HEP   Status New     PT LONG TERM GOAL #2   Title Pt will improve FGA score to >23/30 to decrease falls risk    Baseline 18/30 on 12/05/16   Status Revised     PT LONG TERM GOAL #3   Title Pt will improve safety with community  gait as indicated by gait velocity of >50f/sec   Status New     PT LONG TERM GOAL #4   Title Pt will ambulate >1000 over outdoor/uneven terrain and up/down curb at Mod I level and no evidence of LOB and improved foot clearance   Status New     PT LONG TERM GOAL #5   Title Pt will improve ABC score to >80% confidence   Baseline 64.4%   Status New           Plan - 12/19/16 1238    Clinical Impression Statement Pt met 3/5 STGs and partially met the remaining 2 goals. Remainder of skilled session continued to address balance and ankle strengthening without any issues reported. Pt is making steady progress toward goals and should benefit from contineud PT progress toward unmet goals.     Rehab Potential Good   Clinical Impairments Affecting Rehab Potential lymphoma, chemo   PT Frequency 2x / week   PT Duration 8 weeks  likely will D/C between 4-6 weeks   PT Treatment/Interventions ADLs/Self Care Home Management;DME Instruction;Gait training;Stair training;Functional mobility training;Therapeutic activities;Therapeutic exercise;Balance training;Neuromuscular re-education;Patient/family education;Orthotic Fit/Training;Energy conservation   PT Next Visit Plan advance balance activities with emphasis on SLS, strengthening exercises fostering foot clearance in gait    Consulted and Agree with Plan of Care Patient      Patient will benefit from skilled therapeutic intervention in order to improve the following deficits and impairments:  Abnormal gait, Decreased activity tolerance, Decreased balance, Decreased endurance, Decreased strength, Difficulty walking, Impaired sensation  Visit  Diagnosis: Muscle weakness (generalized)  Unsteadiness on feet  Other lack of coordination  Foot drop, left  Foot drop, right  Difficulty in walking, not elsewhere classified  Repeated falls     Problem List Patient Active Problem List   Diagnosis Date Noted  . Port catheter in place 11/06/2016  . Peripheral neuropathy 09/11/2016  . Atypical pneumonia 06/18/2016  . [place] with [place] 06/18/2016  . Fever 06/16/2016  . Lactic acidosis 06/16/2016  . Dehydration 06/16/2016  . Encounter for antineoplastic chemotherapy 04/17/2016  . NHL (non-Hodgkin's lymphoma) ([place] 04/05/2016  . Diffuse large B-cell lymphoma [place] lymph nodes of multiple regions ([place] 04/05/2016  . Lung mass 03/13/2016  . Post-operative state 03/08/2014  . Left inguinal hernia 05/08/2013    KWillow Ora PTA, CPrinceton Orthopaedic Associates Ii PaOutpatient Neuro RYork County Outpatient Endoscopy Center LLC[address] [place] [phone]/26/18, 9:26 AM   Name: Seth COZZOLINOMRN: [id number] of Birth: 9July 29, 1937

## 2016-12-21 ENCOUNTER — Ambulatory Visit: Payer: Medicare Other | Admitting: Occupational Therapy

## 2016-12-21 ENCOUNTER — Ambulatory Visit: Payer: Medicare Other | Admitting: Physical Therapy

## 2016-12-21 ENCOUNTER — Encounter: Payer: Self-pay | Admitting: Physical Therapy

## 2016-12-21 DIAGNOSIS — R296 Repeated falls: Secondary | ICD-10-CM

## 2016-12-21 DIAGNOSIS — R262 Difficulty in walking, not elsewhere classified: Secondary | ICD-10-CM

## 2016-12-21 DIAGNOSIS — M21372 Foot drop, left foot: Secondary | ICD-10-CM | POA: Diagnosis not present

## 2016-12-21 DIAGNOSIS — R208 Other disturbances of skin sensation: Secondary | ICD-10-CM

## 2016-12-21 DIAGNOSIS — R278 Other lack of coordination: Secondary | ICD-10-CM

## 2016-12-21 DIAGNOSIS — M6281 Muscle weakness (generalized): Secondary | ICD-10-CM

## 2016-12-21 DIAGNOSIS — R2681 Unsteadiness on feet: Secondary | ICD-10-CM

## 2016-12-21 DIAGNOSIS — M21371 Foot drop, right foot: Secondary | ICD-10-CM

## 2016-12-21 NOTE — Therapy (Signed)
[facility] [address] [place], [place], [address] Phone: [phone]   Fax:  [phone]  Physical Therapy Treatment  Patient Details  Name: Seth Gilbert MRN: [id number] Date of Birth: 1936-02-15 Referring Provider: Melvenia Beam, MD  Encounter Date: 12/21/2016      PT End of Session - 12/21/16 1235    Visit Number 7   Number of Visits 17   Date for PT Re-Evaluation 01/26/17   Authorization Type Blue Cross Blue Shield [place] code and progress note 10th visit   PT Start Time 1233   PT Stop Time 1315   PT Time Calculation (min) 42 min   Equipment Utilized During Treatment Gait belt   Activity Tolerance Patient tolerated treatment well   Behavior During Therapy WFL for tasks assessed/performed      Past Medical History:  Diagnosis Date  . Cancer ([place])    NHL  . Diverticulitis   . GERD (gastroesophageal reflux disease)    Heartburn at times  . Heart murmur    "comes and goes" onset 4th grade - last time anyone heard it was 01/2014  . History of hiatal hernia   . History of kidney stones   . HOH (hard of hearing)   . Non-Hodgkin lymphoma ([place])   . Peripheral neuropathy 09/11/2016  . PONV (postoperative nausea and vomiting)   . Rosacea   . Wears glasses     Past Surgical History:  Procedure Laterality Date  . APPENDECTOMY     2-3 yrs old  . COLONOSCOPY    . HEMORROIDECTOMY     inflammed anal gland removed  . INGUINAL HERNIA REPAIR Left 02/11/2014   Procedure: LEFT INGUINAL HERNIA REPAIR WITH MESH ;  Surgeon: Joyice Faster. Cornett, MD;  Location: [facility];  Service: General;  Laterality: Left;  . INSERTION OF MESH N/A 02/11/2014   Procedure: INSERTION OF MESH;  Surgeon: Joyice Faster. Cornett, MD;  Location: [facility];  Service: General;  Laterality: N/A;  . PORTACATH PLACEMENT Left 04/10/2016   Procedure: INSERTION PORT-A-CATH into LEFT Subclavian Vein with guidance from  ultrasound and flouroscopy;  Surgeon: Grace Isaac, MD;  Location: [facility];  Service: Thoracic;  Laterality: Left;  . TONSILLECTOMY    . VIDEO BRONCHOSCOPY WITH ENDOBRONCHIAL ULTRASOUND N/A 03/30/2016   Procedure: VIDEO BRONCHOSCOPY WITH ENDOBRONCHIAL ULTRASOUND,WITH TRANSBRONCHIAL BIOPSY;  Surgeon: Grace Isaac, MD;  Location: [facility];  Service: Thoracic;  Laterality: N/A;    There were no vitals filed for this visit.      Subjective Assessment - 12/21/16 1234    Subjective No new complaints or falls to report.    Patient is accompained by: Family member   Pertinent History lymphoma with chemo, HOH   Limitations Walking;Other (comment)   Patient Stated Goals Get back to normal function.     Currently in Pain? No/denies            [facility] Adult PT Treatment/Exercise - 12/21/16 1236      Transfers   Transfers Sit to Stand;Stand to Sit     Ambulation/Gait   Ambulation/Gait Yes   Ambulation/Gait Assistance 5: Supervision   Ambulation/Gait Assistance Details cues on posture at times. scuffing on paved surfaces noted 2-3 times and increased foot slap noted for last 50-60 feet of gait.    Ambulation Distance (Feet) 1000 Feet   Assistive device None   Gait Pattern Step-through pattern;Decreased dorsiflexion - right;Decreased dorsiflexion - left;Poor foot clearance -  left;Poor foot clearance - right   Ambulation Surface Level;Unlevel;Indoor;Outdoor;Paved     High Level Balance   High Level Balance Activities Marching forwards;Marching backwards  toe walk fwd/bwd, heel walk fwd/bwd   High Level Balance Comments on red mats next to counter top with intermittent to light UE support on counter: 2-3 sec holds in air each leg with marching, decreased height with heel raises with decr ability to maintain heel raise with movements after 1st lap down/back, performed 3 laps each way, pt unable to hold toes up for heel walking at all- was able to heel strike with step then forefoot came down  to mats with each step x 3 laps each way. min guard to min assist for balance with these activities.      added the following to pt's HEP today:    With heel hanging off edge of sturdy item (bed, sofa, chair, stool). Use green band to pull your toes toward you. Holding band at set pull (anchor hands and band to top of your leg). Push toes down and then slowly allow the band to pull your foot back up Repeat __10__ times each leg. Do _1_ sets per session. Do 1-2_ sessions per day.  http://orth.exer.us/10   Copyright  VHI. All rights reserved.          PT Education - 12/21/16 1313    Education provided Yes   Education Details HEP for ankle/LE strengthening   Person(s) Educated Patient   Methods Explanation;Demonstration;Verbal cues;Tactile cues;Handout   Comprehension Verbalized understanding;Returned demonstration;Verbal cues required;Tactile cues required;Need further instruction          PT Short Term Goals - 12/19/16 1238      PT SHORT TERM GOAL #1   Title (TARGET DATE FOR STG 12/19/16)  Pt will participate in FGA with LTG to be set   Status Achieved     PT SHORT TERM GOAL #2   Title Pt will improve functional LE strength as indicated by decrease in 5x sit to stand score of <10 seconds   Baseline 12/19/16: 11.10 sec's today, improved from 12.32 sec's at eval   Status Partially Met     PT SHORT TERM GOAL #3   Title Pt will improve safety with gait as indicated by improvement in gait velocity to >3.5 ft/sec   Baseline 12/19/16: 3.24 ft/sec today, improved from 3.07 ft/sec at eval   Status Partially Met     PT SHORT TERM GOAL #4   Title Pt will negotiate 8 stairs, alternating sequence with no rail and supervision    Baseline 12/19/16: met today   Status Achieved     PT SHORT TERM GOAL #5   Title Pt will be able to maintain 30 seconds on all conditions on MCTSIB to indicate improved use of all sensory systems   Baseline 12/19/16: met today   Status Achieved             PT Long Term Goals - 12/05/16 1552      PT LONG TERM GOAL #1   Title (TARGET DATE FOR ALL LTG 01/09/17) Pt will demonstrate independence with LE strengthening and balance HEP   Status New     PT LONG TERM GOAL #2   Title Pt will improve FGA score to >23/30 to decrease falls risk    Baseline 18/30 on 12/05/16   Status Revised     PT LONG TERM GOAL #3   Title Pt will improve safety with community gait as indicated by  gait velocity of >59f/sec   Status New     PT LONG TERM GOAL #4   Title Pt will ambulate >1000 over outdoor/uneven terrain and up/down curb at Mod I level and no evidence of LOB and improved foot clearance   Status New     PT LONG TERM GOAL #5   Title Pt will improve ABC score to >80% confidence   Baseline 64.4%   Status New            Plan - 12/21/16 1235    Clinical Impression Statement Today's skilled session continued to address gait, ankle strengthening and balance. Added new exercise for ankle strengthening today with no issues reported with performance in session today. Pt should benefit from continued PT to progress toward unmet goals.                              Rehab Potential Good   Clinical Impairments Affecting Rehab Potential lymphoma, chemo   PT Frequency 2x / week   PT Duration 8 weeks  likely will D/C between 4-6 weeks   PT Treatment/Interventions ADLs/Self Care Home Management;DME Instruction;Gait training;Stair training;Functional mobility training;Therapeutic activities;Therapeutic exercise;Balance training;Neuromuscular re-education;Patient/family education;Orthotic Fit/Training;Energy conservation   PT Next Visit Plan advance balance activities with emphasis on SLS, strengthening exercises fostering foot clearance in gait    Consulted and Agree with Plan of Care Patient      Patient will benefit from skilled therapeutic intervention in order to improve the following deficits and impairments:  Abnormal gait, Decreased activity tolerance,  Decreased balance, Decreased endurance, Decreased strength, Difficulty walking, Impaired sensation  Visit Diagnosis: Muscle weakness (generalized)  Unsteadiness on feet  Foot drop, left  Difficulty in walking, not elsewhere classified  Foot drop, right  Repeated falls     Problem List Patient Active Problem List   Diagnosis Date Noted  . Port catheter in place 11/06/2016  . Peripheral neuropathy 09/11/2016  . Atypical pneumonia 06/18/2016  . Acute respiratory failure with [place] ([place] 06/18/2016  . Fever 06/16/2016  . Lactic acidosis 06/16/2016  . Dehydration 06/16/2016  . Encounter for antineoplastic chemotherapy 04/17/2016  . NHL (non-Hodgkin's lymphoma) ([place] 04/05/2016  . Diffuse large B-cell lymphoma of lymph nodes of multiple regions ([place] 04/05/2016  . Lung mass 03/13/2016  . Post-operative state 03/08/2014  . Left inguinal hernia 05/08/2013    KWillow Ora PTA, CSt Joseph'S Hospital And [facility] CenterOutpatient Neuro RFair[facility] [place] [phone]/27/18, 3:31 PM   Name: Seth HOUSEYMRN: [id number] of Birth: 9Apr 22, 1937

## 2016-12-21 NOTE — Therapy (Signed)
[facility] [address] [place], [place], [address] Phone: [phone]   Fax:  [phone]  Occupational Therapy Treatment  Patient Details  Name: Seth Gilbert MRN: [id number] Date of Birth: 31-Dec-1935 Referring Provider: Dr. Jaynee Eagles  Encounter Date: 12/21/2016      OT End of Session - 12/21/16 1222    Visit Number 6   Number of Visits 17   Date for OT Re-Evaluation 01/25/17   Authorization Type Blue MCR   Authorization - Visit Number 6   Authorization - Number of Visits 10   OT Start Time 6314   OT Stop Time 1230   OT Time Calculation (min) 41 min   Activity Tolerance Patient tolerated treatment well   Behavior During Therapy [facility] for tasks assessed/performed      Past Medical History:  Diagnosis Date  . Cancer ([place])    NHL  . Diverticulitis   . GERD (gastroesophageal reflux disease)    Heartburn at times  . Heart murmur    "comes and goes" onset 4th grade - last time anyone heard it was 01/2014  . History of hiatal hernia   . History of kidney stones   . HOH (hard of hearing)   . Non-Hodgkin lymphoma ([place])   . Peripheral neuropathy 09/11/2016  . PONV (postoperative nausea and vomiting)   . Rosacea   . Wears glasses     Past Surgical History:  Procedure Laterality Date  . APPENDECTOMY     2-3 yrs old  . COLONOSCOPY    . HEMORROIDECTOMY     inflammed anal gland removed  . INGUINAL HERNIA REPAIR Left 02/11/2014   Procedure: LEFT INGUINAL HERNIA REPAIR WITH MESH ;  Surgeon: Joyice Faster. Cornett, MD;  Location: [facility];  Service: General;  Laterality: Left;  . INSERTION OF MESH N/A 02/11/2014   Procedure: INSERTION OF MESH;  Surgeon: Joyice Faster. Cornett, MD;  Location: [facility];  Service: General;  Laterality: N/A;  . PORTACATH PLACEMENT Left 04/10/2016   Procedure: INSERTION PORT-A-CATH into LEFT Subclavian Vein with guidance from ultrasound and flouroscopy;  Surgeon:  Grace Isaac, MD;  Location: [facility];  Service: Thoracic;  Laterality: Left;  . TONSILLECTOMY    . VIDEO BRONCHOSCOPY WITH ENDOBRONCHIAL ULTRASOUND N/A 03/30/2016   Procedure: VIDEO BRONCHOSCOPY WITH ENDOBRONCHIAL ULTRASOUND,WITH TRANSBRONCHIAL BIOPSY;  Surgeon: Grace Isaac, MD;  Location: [facility];  Service: Thoracic;  Laterality: N/A;    There were no vitals filed for this visit.      Subjective Assessment - 12/21/16 1223    Pertinent History  chemo induced peripheral neuropathy with decreased coordination and multiple falls. Pt's PMH: lymphoma and chemotherapy, hypothyroidism.   Patient Stated Goals improve coordination, strength   Currently in Pain? No/denies       Treatment: Arm bike x 7 mins level 5 for conditioning, pt maintained greater than 40 rpm. Quadraped on mat for cat/ cow mod facilitation, then over ball for lifting alternate UE/LE, min-mod facilitation/ difficulty, then rolling ball forwards and backwards in tall kneeling, min facilitation for core stability/ UE strength. Placing grooved pegs in pegboard  With right and LUE, then removing with in hand manipulation, min -mod difficulty, min v.c.                          OT Short Term Goals - 12/14/16 1457      OT SHORT TERM GOAL #1   Title  I with HEP- due 12/26/16   Time 4   Period Weeks   Status New     OT SHORT TERM GOAL #2   Title Pt will verbalize understanding of adapted strategies for ADLs/IADLS and sensory precautions.   Time 4   Period Weeks   Status New     OT SHORT TERM GOAL #3   Title Pt will demonstrate improved ADL performance as evidenced by  decreasing 3 button/ unbutton time to 30 secs or less.   Baseline 35.50 secs   Time 4   Period Weeks   Status New     OT SHORT TERM GOAL #4   Title Pt will increase bilateral standing functional reach to greater than or equal to 10 inches in order to decrease fall risk and increase confidence for ADLs.   Baseline right 9, left 9.5    Time 4   Period Weeks   Status New           OT Long Term Goals - 11/28/16 1651      OT LONG TERM GOAL #1   Title Pt will perform mod complex home management activities at a modified independent level .-01/25/17   Time 8   Period Weeks   Status New     OT LONG TERM GOAL #2   Title Pt will demonstrate improved fine motor coordination for ADLs/IADLS as  evidenced by decreasing bilateral 9 hole peg test score by 3 secs.   Time 8   Period Weeks   Status New     OT LONG TERM GOAL #3   Title Pt will increase LUE grip strength to 45 lbs or greater for increased ease with IADLs.   Time 8   Period Weeks   Status New     OT LONG TERM GOAL #4   Title Pt will demonstrate improved bilateral strength and functional use as evidenced by retrieving a 3 lbs weight at 110 shoulder flexion for right UE  and left UE   Time 8   Period Weeks   Status New               Plan - 12/21/16 1220    Clinical Impression Statement Pt is progressing towards goals for strength and coordination.   OT Frequency 2x / week   OT Duration 8 weeks   OT Treatment/Interventions Balance training;Self-care/ADL training;DME and/or AE instruction;Patient/family education;Therapeutic exercises;Therapeutic exercise;Therapeutic activities;Passive range of motion;Neuromuscular education;Functional Mobility Training;Manual Therapy;Electrical Stimulation;Parrafin;Energy conservation;Cryotherapy;Moist Heat;Splinting   Plan continue to address core stability, standing balance and coordination   Consulted and Agree with Plan of Care Patient      Patient will benefit from skilled therapeutic intervention in order to improve the following deficits and impairments:  Abnormal gait, Pain, Impaired sensation, Decreased coordination, Decreased mobility, Decreased endurance, Decreased range of motion, Decreased strength, Impaired tone, Impaired UE functional use, Difficulty walking, Decreased safety awareness, Decreased  knowledge of precautions, Decreased balance  Visit Diagnosis: Muscle weakness (generalized)  Other lack of coordination  Other disturbances of skin sensation    Problem List Patient Active Problem List   Diagnosis Date Noted  . Port catheter in place 11/06/2016  . Peripheral neuropathy 09/11/2016  . Atypical pneumonia 06/18/2016  . Acute respiratory failure with hypoxia ([place]) 06/18/2016  . Fever 06/16/2016  . Lactic acidosis 06/16/2016  . Dehydration 06/16/2016  . Encounter for antineoplastic chemotherapy 04/17/2016  . NHL (non-Hodgkin's lymphoma) ([place]) 04/05/2016  . Diffuse large B-cell lymphoma of lymph nodes of multiple regions (  [place]) 04/05/2016  . Lung mass 03/13/2016  . Post-operative state 03/08/2014  . Left inguinal hernia 05/08/2013    RINE,KATHRYN 12/21/2016, 12:24 PM  [facility] [address] [place], [place], [address] Phone: [phone]   Fax:  [phone]  Name: CORDARRIUS COAD MRN: [id number] Date of Birth: 1936/01/17

## 2016-12-21 NOTE — Patient Instructions (Addendum)
Plantar Flexion: Resisted    With heel hanging off edge of sturdy item (bed, sofa, chair, stool). Use green band to pull your toes toward you. Holding band at set pull (anchor hands and band to top of your leg). Push toes down and then slowly allow the band to pull your foot back up Repeat __10__ times each leg. Do _1_ sets per session. Do 1-2_ sessions per day.  http://orth.exer.us/10   Copyright  VHI. All rights reserved.

## 2016-12-26 ENCOUNTER — Ambulatory Visit: Payer: Medicare Other | Admitting: Physical Therapy

## 2016-12-26 ENCOUNTER — Ambulatory Visit: Payer: Medicare Other | Attending: Neurology | Admitting: Occupational Therapy

## 2016-12-26 DIAGNOSIS — M6281 Muscle weakness (generalized): Secondary | ICD-10-CM

## 2016-12-26 DIAGNOSIS — R208 Other disturbances of skin sensation: Secondary | ICD-10-CM

## 2016-12-26 DIAGNOSIS — R262 Difficulty in walking, not elsewhere classified: Secondary | ICD-10-CM

## 2016-12-26 DIAGNOSIS — R296 Repeated falls: Secondary | ICD-10-CM | POA: Diagnosis present

## 2016-12-26 DIAGNOSIS — M21371 Foot drop, right foot: Secondary | ICD-10-CM | POA: Insufficient documentation

## 2016-12-26 DIAGNOSIS — M21372 Foot drop, left foot: Secondary | ICD-10-CM | POA: Diagnosis present

## 2016-12-26 DIAGNOSIS — R2681 Unsteadiness on feet: Secondary | ICD-10-CM | POA: Diagnosis present

## 2016-12-26 DIAGNOSIS — R278 Other lack of coordination: Secondary | ICD-10-CM | POA: Insufficient documentation

## 2016-12-26 NOTE — Therapy (Signed)
[facility] [address] [place], [place], [address] Phone: [phone]   Fax:  [phone]  Physical Therapy Treatment  Patient Details  Name: Seth Gilbert MRN: [id number] Date of Birth: Jul 06, 1936 Referring Provider: Melvenia Beam, MD  Encounter Date: 12/26/2016      PT End of Session - 12/26/16 1701    Visit Number 8   Number of Visits 17   Date for PT Re-Evaluation 01/26/17   Authorization Type Blue Cross Blue Shield [place] code and progress note 10th visit   PT Start Time 1321   PT Stop Time 1407   PT Time Calculation (min) 46 min   Activity Tolerance Patient tolerated treatment well   Behavior During Therapy [facility] for tasks assessed/performed      Past Medical History:  Diagnosis Date  . Cancer ([place])    NHL  . Diverticulitis   . GERD (gastroesophageal reflux disease)    Heartburn at times  . Heart murmur    "comes and goes" onset 4th grade - last time anyone heard it was 01/2014  . History of hiatal hernia   . History of kidney stones   . HOH (hard of hearing)   . Non-Hodgkin lymphoma ([place])   . Peripheral neuropathy 09/11/2016  . PONV (postoperative nausea and vomiting)   . Rosacea   . Wears glasses     Past Surgical History:  Procedure Laterality Date  . APPENDECTOMY     2-3 yrs old  . COLONOSCOPY    . HEMORROIDECTOMY     inflammed anal gland removed  . INGUINAL HERNIA REPAIR Left 02/11/2014   Procedure: LEFT INGUINAL HERNIA REPAIR WITH MESH ;  Surgeon: Joyice Faster. Cornett, MD;  Location: [facility] [place];  Service: General;  Laterality: Left;  . INSERTION OF MESH N/A 02/11/2014   Procedure: INSERTION OF MESH;  Surgeon: Joyice Faster. Cornett, MD;  Location: [facility];  Service: General;  Laterality: N/A;  . PORTACATH PLACEMENT Left 04/10/2016   Procedure: INSERTION PORT-A-CATH into LEFT Subclavian Vein with guidance from ultrasound and flouroscopy;  Surgeon: Grace Isaac, MD;  Location: [facility];  Service: Thoracic;  Laterality: Left;  . TONSILLECTOMY    . VIDEO BRONCHOSCOPY WITH ENDOBRONCHIAL ULTRASOUND N/A 03/30/2016   Procedure: VIDEO BRONCHOSCOPY WITH ENDOBRONCHIAL ULTRASOUND,WITH TRANSBRONCHIAL BIOPSY;  Surgeon: Grace Isaac, MD;  Location: [facility];  Service: Thoracic;  Laterality: N/A;    There were no vitals filed for this visit.                       [facility] Adult PT Treatment/Exercise - 12/26/16 1656      Knee/Hip Exercises: Standing   Hip Abduction Stengthening;Right;Left;10 reps;Knee straight;Other (comment)   Abduction Limitations Closed chain hip dips with one foot on 4" step, other side tapping down to floor with mirror as visual feedback for hip closed chain ABD<>ADD training     Ankle Exercises: Seated   Other Seated Ankle Exercises Green theraband resisted ankle PF, eversion and inversion reviewing PF for HEP and addition of eversion/inversion to HEP     Ankle Exercises: Standing   SLS R and LLE x 10 seconds with UE flexion touching wall as feedback for lateral weight shifting   Rocker Board Other (comment)   Rocker Board Limitations anterior/posterior weight shifting with ankle DF/PF with EO and reaching>>EC with reaching; also performed single limb stance with board turned laterally to focus on ankle supination/pronation for  R and LLE                PT Education - 12/26/16 1701    Education provided Yes   Education Details added resisted ankle eversion/inversion to HEP   Person(s) Educated Patient   Methods Explanation;Handout;Demonstration   Comprehension Verbalized understanding;Returned demonstration     Ankle Inversion: Long-Sitting (Single Leg)    Tubing around forefoot, on same side as anchor, rotate ankle, pointing toes inward. Repeat _10_ times per set. Repeat with other leg. Do _1_ sets per session. Do _5_ sessions per week. Anchor Height: Ankle (when standing)  http://tub.exer.us/219    Copyright  VHI. All rights reserved.  Strengthening Eversion (Resistive)    Secure resistive band or tubing around stable furniture. Place left foot, near toes, in other end of loop. With inside of foot toward furniture, pull toward little toe as you roll outer edge of foot up. Repeat _10___ times each foot. Do _1___ sessions per day.       PT Short Term Goals - 12/19/16 1238      PT SHORT TERM GOAL #1   Title (TARGET DATE FOR STG 12/19/16)  Pt will participate in FGA with LTG to be set   Status Achieved     PT SHORT TERM GOAL #2   Title Pt will improve functional LE strength as indicated by decrease in 5x sit to stand score of <10 seconds   Baseline 12/19/16: 11.10 sec's today, improved from 12.32 sec's at eval   Status Partially Met     PT SHORT TERM GOAL #3   Title Pt will improve safety with gait as indicated by improvement in gait velocity to >3.5 ft/sec   Baseline 12/19/16: 3.24 ft/sec today, improved from 3.07 ft/sec at eval   Status Partially Met     PT SHORT TERM GOAL #4   Title Pt will negotiate 8 stairs, alternating sequence with no rail and supervision    Baseline 12/19/16: met today   Status Achieved     PT SHORT TERM GOAL #5   Title Pt will be able to maintain 30 seconds on all conditions on MCTSIB to indicate improved use of all sensory systems   Baseline 12/19/16: met today   Status Achieved           PT Long Term Goals - 12/05/16 1552      PT LONG TERM GOAL #1   Title (TARGET DATE FOR ALL LTG 01/09/17) Pt will demonstrate independence with LE strengthening and balance HEP   Status New     PT LONG TERM GOAL #2   Title Pt will improve FGA score to >23/30 to decrease falls risk    Baseline 18/30 on 12/05/16   Status Revised     PT LONG TERM GOAL #3   Title Pt will improve safety with community gait as indicated by gait velocity of >48f/sec   Status New     PT LONG TERM GOAL #4   Title Pt will ambulate >1000 over outdoor/uneven terrain and up/down  curb at Mod I level and no evidence of LOB and improved foot clearance   Status New     PT LONG TERM GOAL #5   Title Pt will improve ABC score to >80% confidence   Baseline 64.4%   Status New               Plan - 12/26/16 1701    Clinical Impression Statement Continued to focus on review of HEP, adjustment  and progression of HEP and continued focus on balance, single limb stance and closed chain hip ABD strengthening.  Pt has been focusing mainly on ankle DF/PF thus far, specific strengthening for ankle eversion/inversion added today.     Rehab Potential Good   Clinical Impairments Affecting Rehab Potential lymphoma, chemo   PT Treatment/Interventions ADLs/Self Care Home Management;DME Instruction;Gait training;Stair training;Functional mobility training;Therapeutic activities;Therapeutic exercise;Balance training;Neuromuscular re-education;Patient/family education;Orthotic Fit/Training;Energy conservation   PT Next Visit Plan advance balance activities with emphasis on hip ABD strengthening, SLS, strengthening exercises fostering foot clearance in gait, compliant surfaces and vision removed   Consulted and Agree with Plan of Care Patient      Patient will benefit from skilled therapeutic intervention in order to improve the following deficits and impairments:  Abnormal gait, Decreased activity tolerance, Decreased balance, Decreased endurance, Decreased strength, Difficulty walking, Impaired sensation  Visit Diagnosis: Muscle weakness (generalized)  Foot drop, left  Difficulty in walking, not elsewhere classified  Foot drop, right  Repeated falls  Unsteadiness on feet     Problem List Patient Active Problem List   Diagnosis Date Noted  . Port catheter in place 11/06/2016  . Peripheral neuropathy 09/11/2016  . Atypical pneumonia 06/18/2016  . Acute respiratory failure with hypoxia ([place]) 06/18/2016  . Fever 06/16/2016  . Lactic acidosis 06/16/2016  . Dehydration  06/16/2016  . Encounter for antineoplastic chemotherapy 04/17/2016  . NHL (non-Hodgkin's lymphoma) ([place]) 04/05/2016  . Diffuse large B-cell lymphoma of lymph nodes of multiple regions ([place]) 04/05/2016  . Lung mass 03/13/2016  . Post-operative state 03/08/2014  . Left inguinal hernia 05/08/2013    Raylene Everts, PT, DPT 12/26/16    5:06 PM    [facility] [address] [place], [place], [address] Phone: [phone]   Fax:  [phone]  Name: Seth Gilbert MRN: [id number] Date of Birth: [birth year]

## 2016-12-26 NOTE — Patient Instructions (Signed)
Ankle Inversion: Long-Sitting (Single Leg)    Tubing around forefoot, on same side as anchor, rotate ankle, pointing toes inward. Repeat _10_ times per set. Repeat with other leg. Do _1_ sets per session. Do _5_ sessions per week. Anchor Height: Ankle (when standing)  http://tub.exer.us/219   Copyright  VHI. All rights reserved.  Strengthening Eversion (Resistive)    Secure resistive band or tubing around stable furniture. Place left foot, near toes, in other end of loop. With inside of foot toward furniture, pull toward little toe as you roll outer edge of foot up. Repeat _10___ times each foot. Do _1___ sessions per day.  http://gt2.exer.us/425   Copyright  VHI. All rights reserved.

## 2016-12-26 NOTE — Therapy (Signed)
[facility] [address] [place], [place], [address] Phone: [phone]   Fax:  [phone]  Occupational Therapy Treatment  Patient Details  Name: Seth Gilbert MRN: [id number] Date of Birth: 1936/05/22 Referring Provider: Dr. Jaynee Eagles  Encounter Date: 12/26/2016      OT End of Session - 12/26/16 1423    Visit Number 7   Number of Visits 17   Date for OT Re-Evaluation 01/25/17   Authorization Type Blue MCR   Authorization - Visit Number 7   Authorization - Number of Visits 10   OT Start Time 2263   OT Stop Time 1455   OT Time Calculation (min) 43 min   Activity Tolerance Patient tolerated treatment well   Behavior During Therapy [facility] for tasks assessed/performed      Past Medical History:  Diagnosis Date  . Cancer ([place])    NHL  . Diverticulitis   . GERD (gastroesophageal reflux disease)    Heartburn at times  . Heart murmur    "comes and goes" onset 4th grade - last time anyone heard it was 01/2014  . History of hiatal hernia   . History of kidney stones   . HOH (hard of hearing)   . Non-Hodgkin lymphoma ([place])   . Peripheral neuropathy 09/11/2016  . PONV (postoperative nausea and vomiting)   . Rosacea   . Wears glasses     Past Surgical History:  Procedure Laterality Date  . APPENDECTOMY     2-3 yrs old  . COLONOSCOPY    . HEMORROIDECTOMY     inflammed anal gland removed  . INGUINAL HERNIA REPAIR Left 02/11/2014   Procedure: LEFT INGUINAL HERNIA REPAIR WITH MESH ;  Surgeon: Joyice Faster. Cornett, MD;  Location: [facility];  Service: General;  Laterality: Left;  . INSERTION OF MESH N/A 02/11/2014   Procedure: INSERTION OF MESH;  Surgeon: Joyice Faster. Cornett, MD;  Location: [facility];  Service: General;  Laterality: N/A;  . PORTACATH PLACEMENT Left 04/10/2016   Procedure: INSERTION PORT-A-CATH into LEFT Subclavian Vein with guidance from ultrasound and flouroscopy;  Surgeon:  Grace Isaac, MD;  Location: [facility];  Service: Thoracic;  Laterality: Left;  . TONSILLECTOMY    . VIDEO BRONCHOSCOPY WITH ENDOBRONCHIAL ULTRASOUND N/A 03/30/2016   Procedure: VIDEO BRONCHOSCOPY WITH ENDOBRONCHIAL ULTRASOUND,WITH TRANSBRONCHIAL BIOPSY;  Surgeon: Grace Isaac, MD;  Location: [facility];  Service: Thoracic;  Laterality: N/A;    There were no vitals filed for this visit.      Subjective Assessment - 12/26/16 1413    Pertinent History  chemo induced peripheral neuropathy with decreased coordination and multiple falls. Pt's PMH: lymphoma and chemotherapy, hypothyroidism.   Patient Stated Goals improve coordination, strength   Currently in Pain? No/denies          Arm bike x 8 mins level 5 for conditioning, pt maintained greater than 40 rpm. Quadraped on mat  over ball for lifting alternate UE/LE, min facilitation/ difficulty, then rolling ball forwards and backwards in tall kneeling, min facilitation for core stability/ UE strength. Standing on non compliant surface while retrieving graded clothespins from a low surface to place on antennae outside of base of support, pt transitioned to a more challenging non-copliant surface to remove clothespins from antennae, min A / v.c for LOB Placing purdue pegs in pegboard  With right and LUE, then removing with in hand manipulation, min difficulty, min v.c.  OT Short Term Goals - 12/14/16 1457      OT SHORT TERM GOAL #1   Title I with HEP- due 12/26/16   Time 4   Period Weeks   Status New     OT SHORT TERM GOAL #2   Title Pt will verbalize understanding of adapted strategies for ADLs/IADLS and sensory precautions.   Time 4   Period Weeks   Status New     OT SHORT TERM GOAL #3   Title Pt will demonstrate improved ADL performance as evidenced by  decreasing 3 button/ unbutton time to 30 secs or less.   Baseline 35.50 secs   Time 4   Period Weeks   Status New     OT SHORT TERM  GOAL #4   Title Pt will increase bilateral standing functional reach to greater than or equal to 10 inches in order to decrease fall risk and increase confidence for ADLs.   Baseline right 9, left 9.5   Time 4   Period Weeks   Status New           OT Long Term Goals - 11/28/16 1651      OT LONG TERM GOAL #1   Title Pt will perform mod complex home management activities at a modified independent level .-01/25/17   Time 8   Period Weeks   Status New     OT LONG TERM GOAL #2   Title Pt will demonstrate improved fine motor coordination for ADLs/IADLS as  evidenced by decreasing bilateral 9 hole peg test score by 3 secs.   Time 8   Period Weeks   Status New     OT LONG TERM GOAL #3   Title Pt will increase LUE grip strength to 45 lbs or greater for increased ease with IADLs.   Time 8   Period Weeks   Status New     OT LONG TERM GOAL #4   Title Pt will demonstrate improved bilateral strength and functional use as evidenced by retrieving a 3 lbs weight at 110 shoulder flexion for right UE  and left UE   Time 8   Period Weeks   Status New               Plan - 12/26/16 1434    Clinical Impression Statement Pt is progressing towards goals. He demonstrates improving core stability and fine motor coordination   Rehab Potential Good   OT Frequency 2x / week   OT Duration 8 weeks   OT Treatment/Interventions Balance training;Self-care/ADL training;DME and/or AE instruction;Patient/family education;Therapeutic exercises;Therapeutic exercise;Therapeutic activities;Passive range of motion;Neuromuscular education;Functional Mobility Training;Manual Therapy;Electrical Stimulation;Parrafin;Energy conservation;Cryotherapy;Moist Heat;Splinting   Plan coordination, dynamic standing balance   Consulted and Agree with Plan of Care Patient   Family Member Consulted wife      Patient will benefit from skilled therapeutic intervention in order to improve the following deficits and  impairments:  Abnormal gait, Pain, Impaired sensation, Decreased coordination, Decreased mobility, Decreased endurance, Decreased range of motion, Decreased strength, Impaired tone, Impaired UE functional use, Difficulty walking, Decreased safety awareness, Decreased knowledge of precautions, Decreased balance  Visit Diagnosis: Muscle weakness (generalized)  Other lack of coordination  Other disturbances of skin sensation  Unsteadiness on feet    Problem List Patient Active Problem List   Diagnosis Date Noted  . Port catheter in place 11/06/2016  . Peripheral neuropathy 09/11/2016  . Atypical pneumonia 06/18/2016  . Acute respiratory failure with hypoxia ([place]) 06/18/2016  . Fever 06/16/2016  .  Lactic acidosis 06/16/2016  . Dehydration 06/16/2016  . Encounter for antineoplastic chemotherapy 04/17/2016  . NHL (non-Hodgkin's lymphoma) ([place]) 04/05/2016  . Diffuse large B-cell lymphoma of lymph nodes of multiple regions ([place]) 04/05/2016  . Lung mass 03/13/2016  . Post-operative state 03/08/2014  . Left inguinal hernia 05/08/2013    Kayln Garceau 12/26/2016, 2:53 PM  [facility] [address] [place], [place], [address] Phone: [phone]   Fax:  [phone]  Name: Seth Gilbert MRN: [id number] Date of Birth: Apr 26, 1936

## 2016-12-28 ENCOUNTER — Ambulatory Visit: Payer: Medicare Other | Admitting: Occupational Therapy

## 2016-12-28 ENCOUNTER — Ambulatory Visit: Payer: Medicare Other | Admitting: Physical Therapy

## 2016-12-28 ENCOUNTER — Encounter: Payer: Self-pay | Admitting: Physical Therapy

## 2016-12-28 DIAGNOSIS — R208 Other disturbances of skin sensation: Secondary | ICD-10-CM

## 2016-12-28 DIAGNOSIS — M21372 Foot drop, left foot: Secondary | ICD-10-CM

## 2016-12-28 DIAGNOSIS — R296 Repeated falls: Secondary | ICD-10-CM

## 2016-12-28 DIAGNOSIS — M21371 Foot drop, right foot: Secondary | ICD-10-CM

## 2016-12-28 DIAGNOSIS — M6281 Muscle weakness (generalized): Secondary | ICD-10-CM

## 2016-12-28 DIAGNOSIS — R278 Other lack of coordination: Secondary | ICD-10-CM

## 2016-12-28 DIAGNOSIS — R2681 Unsteadiness on feet: Secondary | ICD-10-CM

## 2016-12-28 DIAGNOSIS — R262 Difficulty in walking, not elsewhere classified: Secondary | ICD-10-CM

## 2016-12-28 NOTE — Therapy (Signed)
[facility] [address] [place], [place], [address] Phone: [phone]   Fax:  [phone]  Occupational Therapy Treatment  Patient Details  Name: Seth Gilbert MRN: [id number] Date of Birth: Oct 09, 1935 Referring Provider: Dr. Jaynee Eagles  Encounter Date: 12/28/2016      OT End of Session - 12/28/16 1511    Visit Number 8   Number of Visits 17   Date for OT Re-Evaluation 01/25/17   Authorization Type Blue MCR   Authorization - Visit Number 8   Authorization - Number of Visits 10   OT Start Time 3419   OT Stop Time 1445   OT Time Calculation (min) 42 min   Activity Tolerance Patient tolerated treatment well   Behavior During Therapy [facility] for tasks assessed/performed      Past Medical History:  Diagnosis Date  . Cancer ([place])    NHL  . Diverticulitis   . GERD (gastroesophageal reflux disease)    Heartburn at times  . Heart murmur    "comes and goes" onset 4th grade - last time anyone heard it was 01/2014  . History of hiatal hernia   . History of kidney stones   . HOH (hard of hearing)   . Non-Hodgkin lymphoma ([place])   . Peripheral neuropathy 09/11/2016  . PONV (postoperative nausea and vomiting)   . Rosacea   . Wears glasses     Past Surgical History:  Procedure Laterality Date  . APPENDECTOMY     2-3 yrs old  . COLONOSCOPY    . HEMORROIDECTOMY     inflammed anal gland removed  . INGUINAL HERNIA REPAIR Left 02/11/2014   Procedure: LEFT INGUINAL HERNIA REPAIR WITH MESH ;  Surgeon: Joyice Faster. Cornett, MD;  Location: [facility];  Service: General;  Laterality: Left;  . INSERTION OF MESH N/A 02/11/2014   Procedure: INSERTION OF MESH;  Surgeon: Joyice Faster. Cornett, MD;  Location: [facility];  Service: General;  Laterality: N/A;  . PORTACATH PLACEMENT Left 04/10/2016   Procedure: INSERTION PORT-A-CATH into LEFT Subclavian Vein with guidance from ultrasound and flouroscopy;  Surgeon:  Grace Isaac, MD;  Location: [facility];  Service: Thoracic;  Laterality: Left;  . TONSILLECTOMY    . VIDEO BRONCHOSCOPY WITH ENDOBRONCHIAL ULTRASOUND N/A 03/30/2016   Procedure: VIDEO BRONCHOSCOPY WITH ENDOBRONCHIAL ULTRASOUND,WITH TRANSBRONCHIAL BIOPSY;  Surgeon: Grace Isaac, MD;  Location: [facility];  Service: Thoracic;  Laterality: N/A;    There were no vitals filed for this visit.      Subjective Assessment - 12/28/16 1405    Subjective  Pt reports he can do more at home with his hands   Pertinent History  chemo induced peripheral neuropathy with decreased coordination and multiple falls. Pt's PMH: lymphoma and chemotherapy, hypothyroidism.   Patient Stated Goals improve coordination, strength   Currently in Pain? Yes   Pain Score 2    Pain Location Hand   Pain Orientation Right   Pain Descriptors / Indicators Aching   Pain Type Acute pain   Pain Onset In the past 7 days   Pain Frequency Intermittent   Aggravating Factors  overuse   Pain Relieving Factors rest   Multiple Pain Sites No                   Arm bike x 8 mins level 5 for conditioning, pt maintained greater than 40 rpm Standing on non compliant surface while retrieving graded small pegs  from a  low surface to place in pegboard outside of base of support,  min A / v.c for LOB, Pt demonstrated min drops with RUE and mod drops with LUE due to decreased fine motor coordination. Standing to place/ retrieve graded clothespins from antennae with right and left UE's for sustained pinch, min difficulty with RUE, mod difficulty with LUE.              OT Short Term Goals - 12/28/16 1510      OT SHORT TERM GOAL #1   Title I with HEP- due 12/26/16   Time 4   Period Weeks   Status Achieved     OT SHORT TERM GOAL #2   Title Pt will verbalize understanding of adapted strategies for ADLs/IADLS and sensory precautions.   Time 4   Period Weeks   Status On-going     OT SHORT TERM GOAL #3   Title Pt will  demonstrate improved ADL performance as evidenced by  decreasing 3 button/ unbutton time to 30 secs or less.   Baseline 35.50 secs   Time 4   Period Weeks   Status On-going     OT SHORT TERM GOAL #4   Title Pt will increase bilateral standing functional reach to greater than or equal to 10 inches in order to decrease fall risk and increase confidence for ADLs.   Baseline right 9, left 9.5   Time 4   Period Weeks   Status On-going           OT Long Term Goals - 11/28/16 1651      OT LONG TERM GOAL #1   Title Pt will perform mod complex home management activities at a modified independent level .-01/25/17   Time 8   Period Weeks   Status New     OT LONG TERM GOAL #2   Title Pt will demonstrate improved fine motor coordination for ADLs/IADLS as  evidenced by decreasing bilateral 9 hole peg test score by 3 secs.   Time 8   Period Weeks   Status New     OT LONG TERM GOAL #3   Title Pt will increase LUE grip strength to 45 lbs or greater for increased ease with IADLs.   Time 8   Period Weeks   Status New     OT LONG TERM GOAL #4   Title Pt will demonstrate improved bilateral strength and functional use as evidenced by retrieving a 3 lbs weight at 110 shoulder flexion for right UE  and left UE   Time 8   Period Weeks   Status New               Plan - 12/28/16 1443    Clinical Impression Statement Pt is progressing towards goals for standing balance and fine motor coordiantion.   Rehab Potential Good   OT Frequency 2x / week   OT Duration 8 weeks   OT Treatment/Interventions Balance training;Self-care/ADL training;DME and/or AE instruction;Patient/family education;Therapeutic exercises;Therapeutic exercise;Therapeutic activities;Passive range of motion;Neuromuscular education;Functional Mobility Training;Manual Therapy;Electrical Stimulation;Parrafin;Energy conservation;Cryotherapy;Moist Heat;Splinting   Plan check short term goals,UE functional use   Consulted and  Agree with Plan of Care Patient      Patient will benefit from skilled therapeutic intervention in order to improve the following deficits and impairments:  Abnormal gait, Pain, Impaired sensation, Decreased coordination, Decreased mobility, Decreased endurance, Decreased range of motion, Decreased strength, Impaired tone, Impaired UE functional use, Difficulty walking, Decreased safety awareness, Decreased knowledge of precautions, Decreased  balance  Visit Diagnosis: Muscle weakness (generalized)  Unsteadiness on feet  Other lack of coordination  Other disturbances of skin sensation    Problem List Patient Active Problem List   Diagnosis Date Noted  . Port catheter in place 11/06/2016  . Peripheral neuropathy 09/11/2016  . Atypical pneumonia 06/18/2016  . Acute respiratory failure with hypoxia ([place]) 06/18/2016  . Fever 06/16/2016  . Lactic acidosis 06/16/2016  . Dehydration 06/16/2016  . Encounter for antineoplastic chemotherapy 04/17/2016  . NHL (non-Hodgkin's lymphoma) ([place]) 04/05/2016  . Diffuse large B-cell lymphoma of lymph nodes of multiple regions ([place]) 04/05/2016  . Lung mass 03/13/2016  . Post-operative state 03/08/2014  . Left inguinal hernia 05/08/2013    RINE,KATHRYN 12/28/2016, 3:12 PM  [facility] [address] [place], [place], [address] Phone: [phone]   Fax:  [phone]  Name: Seth Gilbert MRN: [id number] Date of Birth: Oct 06, 1935

## 2016-12-28 NOTE — Therapy (Signed)
[facility] [address] [place], [place], [address] Phone: [phone]   Fax:  [phone]  Physical Therapy Treatment  Patient Details  Name: Seth Gilbert MRN: [id number] Date of Birth: October 23, 1935 Referring Provider: Melvenia Beam, MD  Encounter Date: 12/28/2016      PT End of Session - 12/28/16 1635    Visit Number 9   Number of Visits 17   Date for PT Re-Evaluation 01/26/17   Authorization Type [employer] [place] code and progress note 10th visit   PT Start Time 1448   PT Stop Time 1531   PT Time Calculation (min) 43 min   Activity Tolerance Patient tolerated treatment well   Behavior During Therapy [facility] for tasks assessed/performed      Past Medical History:  Diagnosis Date  . Cancer ([place])    NHL  . Diverticulitis   . GERD (gastroesophageal reflux disease)    Heartburn at times  . Heart murmur    "comes and goes" onset 4th grade - last time anyone heard it was 01/2014  . History of hiatal hernia   . History of kidney stones   . HOH (hard of hearing)   . Non-Hodgkin lymphoma ([place])   . Peripheral neuropathy 09/11/2016  . PONV (postoperative nausea and vomiting)   . Rosacea   . Wears glasses     Past Surgical History:  Procedure Laterality Date  . APPENDECTOMY     2-3 yrs old  . COLONOSCOPY    . HEMORROIDECTOMY     inflammed anal gland removed  . INGUINAL HERNIA REPAIR Left 02/11/2014   Procedure: LEFT INGUINAL HERNIA REPAIR WITH MESH ;  Surgeon: Joyice Faster. Cornett, MD;  Location: [facility];  Service: General;  Laterality: Left;  . INSERTION OF MESH N/A 02/11/2014   Procedure: INSERTION OF MESH;  Surgeon: Joyice Faster. Cornett, MD;  Location: [facility];  Service: General;  Laterality: N/A;  . PORTACATH PLACEMENT Left 04/10/2016   Procedure: INSERTION PORT-A-CATH into LEFT Subclavian Vein with guidance from ultrasound and flouroscopy;  Surgeon: Grace Isaac, MD;  Location: [facility];  Service: Thoracic;  Laterality: Left;  . TONSILLECTOMY    . VIDEO BRONCHOSCOPY WITH ENDOBRONCHIAL ULTRASOUND N/A 03/30/2016   Procedure: VIDEO BRONCHOSCOPY WITH ENDOBRONCHIAL ULTRASOUND,WITH TRANSBRONCHIAL BIOPSY;  Surgeon: Grace Isaac, MD;  Location: [employer];  Service: Thoracic;  Laterality: N/A;    There were no vitals filed for this visit.      Subjective Assessment - 12/28/16 1507    Subjective Pt doing well, has questions about some of his new exercises   Pertinent History lymphoma with chemo, HOH   Limitations Walking;Other (comment)   Patient Stated Goals Get back to normal function.     Currently in Pain? No/denies                         [facility] Adult PT Treatment/Exercise - 12/28/16 1527      Neuro Re-ed    Neuro Re-ed Details  Performed dynamic gait and balance outside over gravel with heel walking, toe walking and retro walking x 4 reps total.  On mulch performed high knee marching and walking forwards with eyes closed x 4 reps.  Performed gait over grass forwards, retro, R and L laterally with ball toss to decrease visual dependence on balance with min A overall for balance     Knee/Hip Exercises: Standing  Hip Abduction Stengthening;Right;Left;10 reps;Knee straight;Other (comment)   Abduction Limitations Closed chain hip dips with one foot on 4" step, other side tapping down to floor for hip closed chain ABD<>ADD training     Ankle Exercises: Seated   Other Seated Ankle Exercises upgraded to Black theraband resisted ankle PF but remained with green theraband for ankle everson and inversion x 10-12 reps each     Ankle Exercises: Standing   Heel Raises 10 reps  single leg with UE support                PT Education - 12/28/16 1635    Education provided Yes   Education Details reviewed and answered questions about new HEP exercises, progress seated PF to black theraband   Person(s) Educated Patient   Methods  Explanation;Demonstration   Comprehension Verbalized understanding;Returned demonstration          PT Short Term Goals - 12/19/16 1238      PT SHORT TERM GOAL #1   Title (TARGET DATE FOR STG 12/19/16)  Pt will participate in FGA with LTG to be set   Status Achieved     PT SHORT TERM GOAL #2   Title Pt will improve functional LE strength as indicated by decrease in 5x sit to stand score of <10 seconds   Baseline 12/19/16: 11.10 sec's today, improved from 12.32 sec's at eval   Status Partially Met     PT [place] #3   Title Pt will improve safety with gait as indicated by improvement in gait velocity to >3.5 ft/sec   Baseline 12/19/16: 3.24 ft/sec today, improved from 3.07 ft/sec at eval   Status Partially Met     PT SHORT TERM GOAL #4   Title Pt will negotiate 8 stairs, alternating sequence with no rail and supervision    Baseline 12/19/16: met today   Status Achieved     PT SHORT TERM GOAL #5   Title Pt will be able to maintain 30 seconds on all conditions on MCTSIB to indicate improved use of all sensory systems   Baseline 12/19/16: met today   Status Achieved           PT Long Term Goals - 12/05/16 1552      PT LONG TERM GOAL #1   Title (TARGET DATE FOR ALL LTG 01/09/17) Pt will demonstrate independence with LE strengthening and balance HEP   Status New     PT LONG TERM GOAL #2   Title Pt will improve FGA score to >23/30 to decrease falls risk    Baseline 18/30 on 12/05/16   Status Revised     PT LONG TERM GOAL #3   Title Pt will improve safety with community gait as indicated by gait velocity of >63f/sec   Status New     PT LONG TERM GOAL #4   Title Pt will ambulate >1000 over outdoor/uneven terrain and up/down curb at Mod I level and no evidence of LOB and improved foot clearance   Status New     PT LONG TERM GOAL #5   Title Pt will improve ABC score to >80% confidence   Baseline 64.4%   Status New               Plan - 12/28/16 1636     Clinical Impression Statement Pt continues to make good progress and diligently perform HEP.  Reviewed new exercises and clarified technique for maximum benefit at home for ankle and closed chain  hip strengthening.  Continued higher level gait and balance training outside over variety of surfaces and use of ball toss or eyes closed to decrease dependence on visual input.  Will continue to progress.     Rehab Potential Good   Clinical Impairments Affecting Rehab Potential lymphoma, chemo   PT Treatment/Interventions ADLs/Self Care Home Management;DME Instruction;Gait training;Stair training;Functional mobility training;Therapeutic activities;Therapeutic exercise;Balance training;Neuromuscular re-education;Patient/family education;Orthotic Fit/Training;Energy conservation   PT Next Visit Plan 10th visit G CODE AND PROGRESS NOTE!!!  Continue balance training with hip ABD, ankle strengthening, SLS, compliant surfaces, vision removed.   Consulted and Agree with Plan of Care Patient      Patient will benefit from skilled therapeutic intervention in order to improve the following deficits and impairments:  Abnormal gait, Decreased activity tolerance, Decreased balance, Decreased endurance, Decreased strength, Difficulty walking, Impaired sensation  Visit Diagnosis: Muscle weakness (generalized)  Foot drop, left  Difficulty in walking, not elsewhere classified  Foot drop, right  Repeated falls     Problem List Patient Active Problem List   Diagnosis Date Noted  . Port catheter in place 11/06/2016  . Peripheral neuropathy 09/11/2016  . Atypical pneumonia 06/18/2016  . Acute respiratory failure with hypoxia ([place]) 06/18/2016  . Fever 06/16/2016  . Lactic acidosis 06/16/2016  . Dehydration 06/16/2016  . Encounter for antineoplastic chemotherapy 04/17/2016  . NHL (non-Hodgkin's lymphoma) ([place]) 04/05/2016  . Diffuse large B-cell lymphoma of lymph nodes of multiple regions ([place]) 04/05/2016  .  Lung mass 03/13/2016  . Post-operative state 03/08/2014  . Left inguinal hernia 05/08/2013   Raylene Everts, PT, DPT 12/28/16    4:43 PM    [facility] [address] [place], [place], [address] Phone: [phone]   Fax:  [phone]  Name: Seth Gilbert MRN: [id number] Date of Birth: May 13, 1936

## 2016-12-31 ENCOUNTER — Other Ambulatory Visit: Payer: Self-pay | Admitting: Medical Oncology

## 2016-12-31 ENCOUNTER — Ambulatory Visit: Payer: Medicare Other | Admitting: Physical Therapy

## 2016-12-31 ENCOUNTER — Ambulatory Visit: Payer: Medicare Other | Admitting: Occupational Therapy

## 2016-12-31 ENCOUNTER — Encounter: Payer: Self-pay | Admitting: Physical Therapy

## 2016-12-31 DIAGNOSIS — M6281 Muscle weakness (generalized): Secondary | ICD-10-CM

## 2016-12-31 DIAGNOSIS — R296 Repeated falls: Secondary | ICD-10-CM

## 2016-12-31 DIAGNOSIS — R208 Other disturbances of skin sensation: Secondary | ICD-10-CM

## 2016-12-31 DIAGNOSIS — M21372 Foot drop, left foot: Secondary | ICD-10-CM

## 2016-12-31 DIAGNOSIS — M21371 Foot drop, right foot: Secondary | ICD-10-CM

## 2016-12-31 DIAGNOSIS — R262 Difficulty in walking, not elsewhere classified: Secondary | ICD-10-CM

## 2016-12-31 DIAGNOSIS — R2681 Unsteadiness on feet: Secondary | ICD-10-CM

## 2016-12-31 DIAGNOSIS — R278 Other lack of coordination: Secondary | ICD-10-CM

## 2016-12-31 NOTE — Therapy (Addendum)
[facility] [address] [place], [place], [address] Phone: [phone]   Fax:  [phone]  Physical Therapy Treatment  Patient Details  Name: Seth Gilbert MRN: [id number] Date of Birth: 28-Sep-1935 Referring Provider: Melvenia Beam, MD  Encounter Date: 12/31/2016      PT End of Session - 12/31/16 1714    Visit Number 10   Number of Visits 17   Date for PT Re-Evaluation 01/26/17   Authorization Type [employer] [place] code and progress note 10th visit   PT Start Time 1401   PT Stop Time 1448   PT Time Calculation (min) 47 min   Activity Tolerance Patient tolerated treatment well   Behavior During Therapy [facility] for tasks assessed/performed      Past Medical History:  Diagnosis Date  . Cancer ([place])    NHL  . Diverticulitis   . GERD (gastroesophageal reflux disease)    Heartburn at times  . Heart murmur    "comes and goes" onset 4th grade - last time anyone heard it was 01/2014  . History of hiatal hernia   . History of kidney stones   . HOH (hard of hearing)   . Non-Hodgkin lymphoma ([place])   . Peripheral neuropathy 09/11/2016  . PONV (postoperative nausea and vomiting)   . Rosacea   . Wears glasses     Past Surgical History:  Procedure Laterality Date  . APPENDECTOMY     2-3 yrs old  . COLONOSCOPY    . HEMORROIDECTOMY     inflammed anal gland removed  . INGUINAL HERNIA REPAIR Left 02/11/2014   Procedure: LEFT INGUINAL HERNIA REPAIR WITH MESH ;  Surgeon: Joyice Faster. Cornett, MD;  Location: [facility];  Service: General;  Laterality: Left;  . INSERTION OF MESH N/A 02/11/2014   Procedure: INSERTION OF MESH;  Surgeon: Joyice Faster. Cornett, MD;  Location: [facility];  Service: General;  Laterality: N/A;  . PORTACATH PLACEMENT Left 04/10/2016   Procedure: INSERTION PORT-A-CATH into LEFT Subclavian Vein with guidance from ultrasound and flouroscopy;  Surgeon: Grace Isaac, MD;  Location: [facility];  Service: Thoracic;  Laterality: Left;  . TONSILLECTOMY    . VIDEO BRONCHOSCOPY WITH ENDOBRONCHIAL ULTRASOUND N/A 03/30/2016   Procedure: VIDEO BRONCHOSCOPY WITH ENDOBRONCHIAL ULTRASOUND,WITH TRANSBRONCHIAL BIOPSY;  Surgeon: Grace Isaac, MD;  Location: [facility];  Service: Thoracic;  Laterality: N/A;    There were no vitals filed for this visit.      Subjective Assessment - 12/31/16 1405    Subjective Did some yard work this weekend, no issues.  No issues with exercises.     Pertinent History lymphoma with chemo, HOH   Limitations Walking;Other (comment)   Patient Stated Goals Get back to normal function.     Currently in Pain? No/denies            [facility] PT Assessment - 12/31/16 1408      Functional Gait  Assessment   Gait assessed  Yes   Gait Level Surface Walks 20 ft in less than 7 sec but greater than 5.5 sec, uses assistive device, slower speed, mild gait deviations, or deviates 6-10 in outside of the 12 in walkway width.   Change in Gait Speed Able to smoothly change walking speed without loss of balance or gait deviation. Deviate no more than 6 in outside of the 12 in walkway width.   Gait with Horizontal Head Turns Performs head turns smoothly  with no change in gait. Deviates no more than 6 in outside 12 in walkway width   Gait with Vertical Head Turns Performs head turns with no change in gait. Deviates no more than 6 in outside 12 in walkway width.   Gait and Pivot Turn Pivot turns safely within 3 sec and stops quickly with no loss of balance.   Step Over Obstacle Is able to step over one shoe box (4.5 in total height) without changing gait speed. No evidence of imbalance.   Gait with Narrow Base of Support Ambulates less than 4 steps heel to toe or cannot perform without assistance.   Gait with Eyes Closed Walks 20 ft, slow speed, abnormal gait pattern, evidence for imbalance, deviates 10-15 in outside 12 in walkway width. Requires more than  9 sec to ambulate 20 ft.   Ambulating Backwards Walks 20 ft, no assistive devices, good speed, no evidence for imbalance, normal gait   Steps Alternating feet, must use rail.   Total Score 22   FGA comment: 22/30                     [facility] Adult PT Treatment/Exercise - 12/31/16 1423      Knee/Hip Exercises: Standing   Lateral Step Up Right;Left;10 reps;Step Height: 8";2 sets  eyes open and then eyes closed, 10 reps each   Forward Step Up Right;Left;2 sets;10 reps;Hand Hold: 1;Step Height: 8"  eyes open and then eyes closed   Wall Squat 1 set;10 reps;5 seconds   Wall Squat Limitations with verbal/visual/tactile cues for technique   Other Standing Knee Exercises Demonstrated to pt how to perform forwards and lateral step ups on stairs with one rail for support                PT Education - 12/31/16 1713    Education provided Yes   Education Details progress made and areas of impairment to continue to focus on   Person(s) Educated Patient   Methods Explanation   Comprehension Verbalized understanding          PT Short Term Goals - 12/19/16 1238      PT SHORT TERM GOAL #1   Title (TARGET DATE FOR STG 12/19/16)  Pt will participate in FGA with LTG to be set   Status Achieved     PT SHORT TERM GOAL #2   Title Pt will improve functional LE strength as indicated by decrease in 5x sit to stand score of <10 seconds   Baseline 12/19/16: 11.10 sec's today, improved from 12.32 sec's at eval   Status Partially Met     PT [place] #3   Title Pt will improve safety with gait as indicated by improvement in gait velocity to >3.5 ft/sec   Baseline 12/19/16: 3.24 ft/sec today, improved from 3.07 ft/sec at eval   Status Partially Met     PT SHORT TERM GOAL #4   Title Pt will negotiate 8 stairs, alternating sequence with no rail and supervision    Baseline 12/19/16: met today   Status Achieved     PT SHORT TERM GOAL #5   Title Pt will be able to maintain 30  seconds on all conditions on MCTSIB to indicate improved use of all sensory systems   Baseline 12/19/16: met today   Status Achieved           PT Long Term Goals - 12/31/16 1715      PT LONG TERM GOAL #1  Title (REVISED TARGET DATE FOR ALL LTG 01/26/17) Pt will demonstrate independence with LE strengthening and balance HEP   Status Revised     PT LONG TERM GOAL #2   Title Pt will improve FGA score to >23/30 to decrease falls risk    Baseline 22/30 on 01-29-17   Status On-going     PT LONG TERM GOAL #3   Title Pt will improve safety with community gait as indicated by gait velocity of >28f/sec   Baseline 3.24 ft/sec on 505-Jun-2018  Status On-going     PT LONG TERM GOAL #4   Title Pt will ambulate >1000 over outdoor/uneven terrain and up/down curb at Mod I level and no evidence of LOB and improved foot clearance   Status On-going     PT LONG TERM GOAL #5   Title Pt will improve ABC score to >80% confidence   Baseline 64.4%   Status On-going               Plan - [id number]    Clinical Impression Statement Performed re-assessment of FGA and gait velocity today with pt demonstrating improvements in both but continues to be below goal level.  Also continued to focus on dynamic LE strengthening, weight shifting and multi-sensory balance training with step ups with EO and EC and wall squats.  Pt continues to present with impaired LE strength, functional endurance, impaired gait, balance and is at increased risk for falls.  Pt will benefit from continued PT services to address impairments and to continue to progress towards LTG.    Rehab Potential Good   Clinical Impairments Affecting Rehab Potential lymphoma, chemo   PT Frequency 2x / week   PT Duration 8 weeks   PT Treatment/Interventions ADLs/Self Care Home Management;DME Instruction;Gait training;Stair training;Functional mobility training;Therapeutic activities;Therapeutic exercise;Balance training;Neuromuscular  re-education;Patient/family education;Orthotic Fit/Training;Energy conservation   PT Next Visit Plan Continue balance training with hip ABD, ankle strengthening, SLS, compliant surfaces, vision removed.   Consulted and Agree with Plan of Care Patient      Patient will benefit from skilled therapeutic intervention in order to improve the following deficits and impairments:  Abnormal gait, Decreased activity tolerance, Decreased balance, Decreased endurance, Decreased strength, Difficulty walking, Impaired sensation  Visit Diagnosis: Muscle weakness (generalized)  Foot drop, left  Foot drop, right  Difficulty in walking, not elsewhere classified  Repeated falls       G-Codes - [id number]    Functional Assessment Tool Used (Outpatient Only) 5x sit to stand, FGA, gait velocity   Functional Limitation Mobility: Walking and moving around   Mobility: Walking and Moving Around Current Status [phone] At least 20 percent but less than 40 percent impaired, limited or restricted   Mobility: Walking and Moving Around Goal Status [phone] At least 1 percent but less than 20 percent impaired, limited or restricted      Problem List Patient Active Problem List   Diagnosis Date Noted  . Port catheter in place 11/06/2016  . Peripheral neuropathy 09/11/2016  . Atypical pneumonia 06/18/2016  . Acute respiratory failure with hypoxia ([place] 06/18/2016  . Fever 06/16/2016  . Lactic acidosis 06/16/2016  . Dehydration 06/16/2016  . Encounter for antineoplastic chemotherapy 04/17/2016  . NHL (non-Hodgkin's lymphoma) ([place] 04/05/2016  . Diffuse large B-cell lymphoma of lymph nodes of multiple regions ([place] 04/05/2016  . Lung mass 03/13/2016  . Post-operative state 03/08/2014  . Left inguinal hernia 05/08/2013   Physical Therapy Progress Note  Dates of Reporting Period: 11/27/16  to 12/31/16  Objective Reports of Subjective Statement: See above   Objective Measurements: See FGA above  Goal  Update: See above  Plan: See above  Reason Skilled Services are Required: To continue to focus on LE strengthening, dynamic balance, gait and decrease falls risk  Raylene Everts, PT, DPT 12/31/16    5:24 PM    [facility] [address] [place], [place], [address] Phone: [phone]   Fax:  [phone]  Name: Seth Gilbert MRN: [id number] Date of Birth: [birth year]

## 2016-12-31 NOTE — Therapy (Signed)
[facility] [address] [place], [place], [address] Phone: [phone]   Fax:  [phone]  Occupational Therapy Treatment  Patient Details  Name: Seth Gilbert MRN: [id number] Date of Birth: January 28, 1936 Referring Provider: Dr. Jaynee Eagles  Encounter Date: 12/31/2016      OT End of Session - 12/31/16 1603    Visit Number 9   Number of Visits 17   Date for OT Re-Evaluation 01/25/17   Authorization Type [employer]   Authorization - Visit Number 9   Authorization - Number of Visits 10   OT Start Time 2409   OT Stop Time 1540   OT Time Calculation (min) 43 min   Activity Tolerance Patient tolerated treatment well   Behavior During Therapy [facility] for tasks assessed/performed      Past Medical History:  Diagnosis Date  . Cancer ([place])    NHL  . Diverticulitis   . GERD (gastroesophageal reflux disease)    Heartburn at times  . Heart murmur    "comes and goes" onset 4th grade - last time anyone heard it was 01/2014  . History of hiatal hernia   . History of kidney stones   . HOH (hard of hearing)   . Non-Hodgkin lymphoma ([place])   . Peripheral neuropathy 09/11/2016  . PONV (postoperative nausea and vomiting)   . Rosacea   . Wears glasses     Past Surgical History:  Procedure Laterality Date  . APPENDECTOMY     2-3 yrs old  . COLONOSCOPY    . HEMORROIDECTOMY     inflammed anal gland removed  . INGUINAL HERNIA REPAIR Left 02/11/2014   Procedure: LEFT INGUINAL HERNIA REPAIR WITH MESH ;  Surgeon: Joyice Faster. Cornett, MD;  Location: [facility];  Service: General;  Laterality: Left;  . INSERTION OF MESH N/A 02/11/2014   Procedure: INSERTION OF MESH;  Surgeon: Joyice Faster. Cornett, MD;  Location: [facility];  Service: General;  Laterality: N/A;  . PORTACATH PLACEMENT Left 04/10/2016   Procedure: INSERTION PORT-A-CATH into LEFT Subclavian Vein with guidance from ultrasound and flouroscopy;  Surgeon:  Grace Isaac, MD;  Location: [facility];  Service: Thoracic;  Laterality: Left;  . TONSILLECTOMY    . VIDEO BRONCHOSCOPY WITH ENDOBRONCHIAL ULTRASOUND N/A 03/30/2016   Procedure: VIDEO BRONCHOSCOPY WITH ENDOBRONCHIAL ULTRASOUND,WITH TRANSBRONCHIAL BIOPSY;  Surgeon: Grace Isaac, MD;  Location: [facility];  Service: Thoracic;  Laterality: N/A;    There were no vitals filed for this visit.      Subjective Assessment - 12/31/16 1524    Subjective  Pt reports doing yardwork   Pertinent History  chemo induced peripheral neuropathy with decreased coordination and multiple falls. Pt's PMH: lymphoma and chemotherapy, hypothyroidism.   Patient Stated Goals improve coordination, strength   Currently in Pain? No/denies        Treatment:Quadraped on mat  over ball for lifting alternate UE/LE, min facilitation/ difficulty, then rolling ball forwards and backwards in tall kneeling, min- mod  facilitation for core stability/ UE strength . Pt was fatigued today after PT. Therapist  checked progress towards goals. Pt demonstrates improved standing functional reach and fine motor coordination for fastening buttons. Gripper set at level 4 for RUE and level 3 for LUE sustained grip to pick up I inch blocks, min difficulty requiring rest break with each hand. Graded clothespins for sustained pinch, min v.c for techniques, pt has to use lateral pinch with LUE for black(most resistive clothespins).  OT Short Term Goals - 12/31/16 1505      OT SHORT TERM GOAL #1   Title I with HEP- due 12/26/16   Time 4   Period Weeks   Status Achieved     OT SHORT TERM GOAL #2   Title Pt will verbalize understanding of adapted strategies for ADLs/IADLS and sensory precautions.   Status Achieved     OT SHORT TERM GOAL #3   Title Pt will demonstrate improved ADL performance as evidenced by  decreasing 3 button/ unbutton time to 30 secs or less.   Baseline 35.50 secs   Time 4    Period Weeks   Status Achieved  22.53 secs     OT SHORT TERM GOAL #4   Title Pt will increase bilateral standing functional reach to greater than or equal to 10 inches in order to decrease fall risk and increase confidence for ADLs.   Time 4   Period Weeks   Status Achieved  rigt 10.5, left 10.5            OT Long Term Goals - 12/31/16 1602      OT LONG TERM GOAL #1   Title Pt will perform mod complex home management activities at a modified independent level .-01/25/17   Time 8   Period Weeks   Status On-going     OT LONG TERM GOAL #2   Title Pt will demonstrate improved fine motor coordination for ADLs/IADLS as  evidenced by decreasing bilateral 9 hole peg test score by 3 secs.   Time 8   Period Weeks   Status On-going     OT LONG TERM GOAL #3   Title Pt will increase LUE grip strength to 45 lbs or greater for increased ease with IADLs.- upgraded Pt will increase LUE grip strength to 55 lbs.   Time 8   Period Weeks   Status Revised   goal met 50 lbs     OT LONG TERM GOAL #4   Title Pt will demonstrate improved bilateral strength and functional use as evidenced by retrieving a 3 lbs weight at 110 shoulder flexion for right UE  and left UE   Time 8   Period Weeks   Status New               Plan - 12/31/16 1600    Clinical Impression Statement Pt demonstrates good overall progress towards goals. See short term goals.   Rehab Potential Good   OT Frequency 2x / week   OT Duration 8 weeks   OT Treatment/Interventions Balance training;Self-care/ADL training;DME and/or AE instruction;Patient/family education;Therapeutic exercises;Therapeutic exercise;Therapeutic activities;Passive range of motion;Neuromuscular education;Functional Mobility Training;Manual Therapy;Electrical Stimulation;[place]rafin;Energy conservation;Cryotherapy;Moist Heat;Splinting   Plan UE functional use, core stability/ dynamic standing balance   Consulted and Agree with Plan of Care Patient       Patient will benefit from skilled therapeutic intervention in order to improve the following deficits and impairments:  Abnormal gait, Pain, Impaired sensation, Decreased coordination, Decreased mobility, Decreased endurance, Decreased range of motion, Decreased strength, Impaired tone, Impaired UE functional use, Difficulty walking, Decreased safety awareness, Decreased knowledge of precautions, Decreased balance  Visit Diagnosis: Muscle weakness (generalized)  Unsteadiness on feet  Other lack of coordination  Other disturbances of skin sensation    Problem List Patient Active Problem List   Diagnosis Date Noted  . Port catheter in place 11/06/2016  . Peripheral neuropathy 09/11/2016  . Atypical pneumonia 06/18/2016  . Acute respiratory failure with hypoxia ([place])  06/18/2016  . Fever 06/16/2016  . Lactic acidosis 06/16/2016  . Dehydration 06/16/2016  . Encounter for antineoplastic chemotherapy 04/17/2016  . NHL (non-Hodgkin's lymphoma) ([place]) 04/05/2016  . Diffuse large B-cell lymphoma of lymph nodes of multiple regions ([place]) 04/05/2016  . Lung mass 03/13/2016  . Post-operative state 03/08/2014  . Left inguinal hernia 05/08/2013    Wilmore Holsomback 12/31/2016, 4:07 PM  [facility] [address] [place], [place], [address] Phone: [phone]   Fax:  [phone]  Name: Seth Gilbert MRN: [id number] Date of Birth: 11-10-1935

## 2017-01-01 ENCOUNTER — Other Ambulatory Visit: Payer: Medicare Other

## 2017-01-01 ENCOUNTER — Ambulatory Visit (HOSPITAL_BASED_OUTPATIENT_CLINIC_OR_DEPARTMENT_OTHER): Payer: Medicare Other

## 2017-01-01 DIAGNOSIS — Z452 Encounter for adjustment and management of vascular access device: Secondary | ICD-10-CM

## 2017-01-01 DIAGNOSIS — Z95828 Presence of other vascular implants and grafts: Secondary | ICD-10-CM

## 2017-01-01 DIAGNOSIS — C8338 Diffuse large B-cell lymphoma, lymph nodes of multiple sites: Secondary | ICD-10-CM

## 2017-01-01 MED ORDER — HEPARIN SOD (PORK) LOCK FLUSH 100 UNIT/ML IV SOLN
500.0000 [IU] | Freq: Once | INTRAVENOUS | Status: AC | PRN
  Administered 2017-01-01: 500 [IU] via INTRAVENOUS
  Filled 2017-01-01: qty 5

## 2017-01-01 MED ORDER — SODIUM CHLORIDE 0.9% FLUSH
10.0000 mL | INTRAVENOUS | Status: DC | PRN
  Administered 2017-01-01: 10 mL via INTRAVENOUS
  Filled 2017-01-01: qty 10

## 2017-01-02 ENCOUNTER — Ambulatory Visit: Payer: Medicare Other | Admitting: Occupational Therapy

## 2017-01-02 ENCOUNTER — Ambulatory Visit: Payer: Medicare Other | Admitting: Physical Therapy

## 2017-01-02 ENCOUNTER — Encounter: Payer: Self-pay | Admitting: Physical Therapy

## 2017-01-02 DIAGNOSIS — R262 Difficulty in walking, not elsewhere classified: Secondary | ICD-10-CM

## 2017-01-02 DIAGNOSIS — R296 Repeated falls: Secondary | ICD-10-CM

## 2017-01-02 DIAGNOSIS — R208 Other disturbances of skin sensation: Secondary | ICD-10-CM

## 2017-01-02 DIAGNOSIS — M6281 Muscle weakness (generalized): Secondary | ICD-10-CM | POA: Diagnosis not present

## 2017-01-02 DIAGNOSIS — R2681 Unsteadiness on feet: Secondary | ICD-10-CM

## 2017-01-02 DIAGNOSIS — R278 Other lack of coordination: Secondary | ICD-10-CM

## 2017-01-02 DIAGNOSIS — M21372 Foot drop, left foot: Secondary | ICD-10-CM

## 2017-01-02 DIAGNOSIS — M21371 Foot drop, right foot: Secondary | ICD-10-CM

## 2017-01-02 NOTE — Patient Instructions (Signed)
       Resisted Horizontal Abduction: Bilateral   Sit or stand, tubing in both hands, arms out in front. Keeping arms straight, pinch shoulder blades together and stretch arms out. Repeat _10___ times per set. Do _1-2___ sessions per day, every other day.       Elbow Extension: Resisted   Sit in chair with resistive band secured at armrest (or hold with other hand) and one_ elbow bent. Straighten elbow. Repeat _10___ times per set.  Do _1-2___ sessions per day, every other day. Repeat with other arm   Copyright  VHI. All rights reserved.

## 2017-01-03 DIAGNOSIS — M6281 Muscle weakness (generalized): Secondary | ICD-10-CM | POA: Diagnosis not present

## 2017-01-03 NOTE — Therapy (Signed)
[facility] [address] [place], [place], [address] Phone: [phone]   Fax:  [phone]  Physical Therapy Treatment  Patient Details  Name: Seth Gilbert MRN: [id number] Date of Birth: 22-Nov-1935 Referring Provider: Melvenia Beam, MD  Encounter Date: 01/02/2017      PT End of Session - 01/02/17 1450    Visit Number 11   Number of Visits 17   Date for PT Re-Evaluation 01/26/17   Authorization Type [employer] [place] code and progress note 10th visit   PT Start Time 1448   PT Stop Time 1530   PT Time Calculation (min) 42 min   Equipment Utilized During Treatment Gait belt   Activity Tolerance Patient tolerated treatment well   Behavior During Therapy [facility] for tasks assessed/performed      Past Medical History:  Diagnosis Date  . Cancer ([place])    NHL  . Diverticulitis   . GERD (gastroesophageal reflux disease)    Heartburn at times  . Heart murmur    "comes and goes" onset 4th grade - last time anyone heard it was 01/2014  . History of hiatal hernia   . History of kidney stones   . HOH (hard of hearing)   . Non-Hodgkin lymphoma ([place])   . Peripheral neuropathy 09/11/2016  . PONV (postoperative nausea and vomiting)   . Rosacea   . Wears glasses     Past Surgical History:  Procedure Laterality Date  . APPENDECTOMY     2-3 yrs old  . COLONOSCOPY    . HEMORROIDECTOMY     inflammed anal gland removed  . INGUINAL HERNIA REPAIR Left 02/11/2014   Procedure: LEFT INGUINAL HERNIA REPAIR WITH MESH ;  Surgeon: Joyice Faster. Cornett, MD;  Location: [facility];  Service: General;  Laterality: Left;  . INSERTION OF MESH N/A 02/11/2014   Procedure: INSERTION OF MESH;  Surgeon: Joyice Faster. Cornett, MD;  Location: [facility];  Service: General;  Laterality: N/A;  . PORTACATH PLACEMENT Left 04/10/2016   Procedure: INSERTION PORT-A-CATH into LEFT Subclavian Vein with guidance from  ultrasound and flouroscopy;  Surgeon: Grace Isaac, MD;  Location: [facility];  Service: Thoracic;  Laterality: Left;  . TONSILLECTOMY    . VIDEO BRONCHOSCOPY WITH ENDOBRONCHIAL ULTRASOUND N/A 03/30/2016   Procedure: VIDEO BRONCHOSCOPY WITH ENDOBRONCHIAL ULTRASOUND,WITH TRANSBRONCHIAL BIOPSY;  Surgeon: Grace Isaac, MD;  Location: [facility];  Service: Thoracic;  Laterality: N/A;    There were no vitals filed for this visit.      Subjective Assessment - 01/02/17 1449    Subjective No new complaints. No pain or falls to report.    Patient is accompained by: Family member   Pertinent History lymphoma with chemo, HOH   Limitations Walking;Other (comment)   Patient Stated Goals Get back to normal function.     Currently in Pain? No/denies   Pain Score 0-No pain               [facility] Adult PT Treatment/Exercise - 01/02/17 1451      Ambulation/Gait   Ambulation/Gait Yes   Ambulation/Gait Assistance 4: Min guard   Ambulation/Gait Assistance Details forward gait on grass around building with head turns left<>forward<>right with min guard assist to min assist for balance    Ambulation Distance (Feet) 500 Feet  x1, plus laps in gravel/mulch   Assistive device None   Gait Pattern Step-through pattern;Decreased dorsiflexion - right;Decreased dorsiflexion - left;Poor foot clearance -  left;Poor foot clearance - right   Ambulation Surface Indoor;Level;Outdoor;Grass;Paved;Gravel             Balance Exercises - 01/02/17 1512      Balance Exercises: Standing   Standing Eyes Closed Narrow base of support (BOS);Head turns;Foam/compliant surface;Other reps (comment);30 secs;Limitations   Tandem Stance Eyes open;Foam/compliant surface;3 reps;10 secs;Limitations   Wall Bumps Hip   Wall Bumps-Hips Eyes opened;Anterior/posterior;Foam/compliant surface;10 reps;Limitations   Tandem Gait Forward;Retro;3 reps;Limitations   Toe Raise Limitations toe walking fwd/bwd x 3 laps each way on gravel,  then x 3 laps each way on rubber mulch, min guard to min assist for balacne     Balance Exercises: Standing   Standing Eyes Closed Limitations blue mat over ramp: performed facing both up/down ramp: narrow base of support with EC no head movements, progressing to EC with head movements left<>right, up<>down and diagonals both ways. min guard to min assist for balance with cues on weight shifting and posture to assist balance.                               Tandem Stance Limitations on blue foam beam x 3 reps each foot forward with min assist and intermittent UE touch for balance   Wall Bumps Limitations standing with feet across blue foam beam: wall bumps with cues on technique, form and weight shifting    Tandem Gait Limitations performed forward/backward on gravel with min to mod assist for balance, then on rubber mulch with min assist. 3 laps on each/each way.              PT Short Term Goals - 12/19/16 1238      PT SHORT TERM GOAL #1   Title (TARGET DATE FOR STG 12/19/16)  Pt will participate in FGA with LTG to be set   Status Achieved     PT SHORT TERM GOAL #2   Title Pt will improve functional LE strength as indicated by decrease in 5x sit to stand score of <10 seconds   Baseline 12/19/16: 11.10 sec's today, improved from 12.32 sec's at eval   Status Partially Met     PT SHORT TERM GOAL #3   Title Pt will improve safety with gait as indicated by improvement in gait velocity to >3.5 ft/sec   Baseline 12/19/16: 3.24 ft/sec today, improved from 3.07 ft/sec at eval   Status Partially Met     PT SHORT TERM GOAL #4   Title Pt will negotiate 8 stairs, alternating sequence with no rail and supervision    Baseline 12/19/16: met today   Status Achieved     PT SHORT TERM GOAL #5   Title Pt will be able to maintain 30 seconds on all conditions on MCTSIB to indicate improved use of all sensory systems   Baseline 12/19/16: met today   Status Achieved           PT Long Term Goals -  12/31/16 1715      PT LONG TERM GOAL #1   Title (REVISED TARGET DATE FOR ALL LTG 01/26/17) Pt will demonstrate independence with LE strengthening and balance HEP   Status Revised     PT LONG TERM GOAL #2   Title Pt will improve FGA score to >23/30 to decrease falls risk    Baseline 22/30 on 12/31/16   Status On-going     PT LONG TERM GOAL #3   Title Pt will improve  safety with community gait as indicated by gait velocity of >65f/sec   Baseline 3.24 ft/sec on 12/31/16   Status On-going     PT LONG TERM GOAL #4   Title Pt will ambulate >1000 over outdoor/uneven terrain and up/down curb at Mod I level and no evidence of LOB and improved foot clearance   Status On-going     PT LONG TERM GOAL #5   Title Pt will improve ABC score to >80% confidence   Baseline 64.4%   Status On-going            Plan - 01/02/17 1450    Clinical Impression Statement today's skilled session continued to address dynamic gait and high level balance with no issues reported. Pt continues to be challenged on complaint surfaces and with dynamic balance activities. Pt is progressing toward goals and should benefit from continued PT to progress toward unmet goals.    Rehab Potential Good   Clinical Impairments Affecting Rehab Potential lymphoma, chemo   PT Frequency 2x / week   PT Duration 8 weeks   PT Treatment/Interventions ADLs/Self Care Home Management;DME Instruction;Gait training;Stair training;Functional mobility training;Therapeutic activities;Therapeutic exercise;Balance training;Neuromuscular re-education;Patient/family education;Orthotic Fit/Training;Energy conservation   PT Next Visit Plan Continue balance training with hip ABD, ankle strengthening, SLS, compliant surfaces, vision removed.   Consulted and Agree with Plan of Care Patient      Patient will benefit from skilled therapeutic intervention in order to improve the following deficits and impairments:  Abnormal gait, Decreased activity  tolerance, Decreased balance, Decreased endurance, Decreased strength, Difficulty walking, Impaired sensation  Visit Diagnosis: Muscle weakness (generalized)  Foot drop, left  Foot drop, right  Difficulty in walking, not elsewhere classified  Repeated falls  Other lack of coordination  Unsteadiness on feet     Problem List Patient Active Problem List   Diagnosis Date Noted  . Port catheter in place 11/06/2016  . Peripheral neuropathy 09/11/2016  . Atypical pneumonia 06/18/2016  . Acute respiratory failure with hypoxia ([place] 06/18/2016  . Fever 06/16/2016  . Lactic acidosis 06/16/2016  . Dehydration 06/16/2016  . Encounter for antineoplastic chemotherapy 04/17/2016  . NHL (non-Hodgkin's lymphoma) ([place] 04/05/2016  . Diffuse large B-cell lymphoma of lymph nodes of multiple regions ([place] 04/05/2016  . Lung mass 03/13/2016  . Post-operative state 03/08/2014  . Left inguinal hernia 05/08/2013    KWillow Ora PTA, CMuscog[facility] Center[facility] Neuro RKanakan[facility] Hospital[address] [place] [phone]/10/18, 7:13 PM  Name: RRASHAWD LASKARISMRN: [id number] of Birth: [birth year]

## 2017-01-03 NOTE — Therapy (Signed)
[facility] [address] [place], [place], [address] Phone: [phone]   Fax:  [phone]  Occupational Therapy Treatment  Patient Details  Name: Seth Gilbert MRN: [id number] Date of Birth: 1935-09-18 Referring Provider: Dr. Jaynee Eagles  Encounter Date: 01/02/2017      OT End of Session - 01/03/17 1237    Visit Number 10   Number of Visits 17   Date for OT Re-Evaluation 01/25/17   Authorization Type Blue MCR   Authorization - Visit Number 10   Authorization - Number of Visits 10   OT Start Time 0947   OT Stop Time 1445   OT Time Calculation (min) 40 min   Activity Tolerance Patient tolerated treatment well   Behavior During Therapy [facility] for tasks assessed/performed      Past Medical History:  Diagnosis Date  . Cancer ([place])    NHL  . Diverticulitis   . GERD (gastroesophageal reflux disease)    Heartburn at times  . Heart murmur    "comes and goes" onset 4th grade - last time anyone heard it was 01/2014  . History of hiatal hernia   . History of kidney stones   . HOH (hard of hearing)   . Non-Hodgkin lymphoma ([place])   . Peripheral neuropathy 09/11/2016  . PONV (postoperative nausea and vomiting)   . Rosacea   . Wears glasses     Past Surgical History:  Procedure Laterality Date  . APPENDECTOMY     2-3 yrs old  . COLONOSCOPY    . HEMORROIDECTOMY     inflammed anal gland removed  . INGUINAL HERNIA REPAIR Left 02/11/2014   Procedure: LEFT INGUINAL HERNIA REPAIR WITH MESH ;  Surgeon: Joyice Faster. Cornett, MD;  Location: [facility];  Service: General;  Laterality: Left;  . INSERTION OF MESH N/A 02/11/2014   Procedure: INSERTION OF MESH;  Surgeon: Joyice Faster. Cornett, MD;  Location: [facility];  Service: General;  Laterality: N/A;  . PORTACATH PLACEMENT Left 04/10/2016   Procedure: INSERTION PORT-A-CATH into LEFT Subclavian Vein with guidance from ultrasound and flouroscopy;  Surgeon:  Grace Isaac, MD;  Location: [facility];  Service: Thoracic;  Laterality: Left;  . TONSILLECTOMY    . VIDEO BRONCHOSCOPY WITH ENDOBRONCHIAL ULTRASOUND N/A 03/30/2016   Procedure: VIDEO BRONCHOSCOPY WITH ENDOBRONCHIAL ULTRASOUND,WITH TRANSBRONCHIAL BIOPSY;  Surgeon: Grace Isaac, MD;  Location: [facility];  Service: Thoracic;  Laterality: N/A;    There were no vitals filed for this visit.      Subjective Assessment - 01/03/17 1241    Pertinent History  chemo induced peripheral neuropathy with decreased coordination and multiple falls. Pt's PMH: lymphoma and chemotherapy, hypothyroidism.   Patient Stated Goals improve coordination, strength   Currently in Pain? Yes   Pain Score 3    Pain Location Wrist   Pain Descriptors / Indicators Aching   Pain Type Acute pain   Pain Onset In the past 7 days   Pain Frequency Intermittent   Aggravating Factors  weightbearing   Pain Relieving Factors rest    Multiple Pain Sites No                      Arm bike x 5 mins level 6 for conditioning. Quadraped over ball rolling ball forwards and backwards with shoulder extension, Quadraped over ball lifting alternating UE/LE, min facilitation/ v.c-task discontinued due to wrist pain. Therapist checked short term goals  OT Education - 01/03/17 1245    Education provided Yes   Education Details theraband HEP and updates with green band   Person(s) Educated Patient   Methods Explanation   Comprehension Verbalized understanding;Returned demonstration;Verbal cues required          OT Short Term Goals - 12/31/16 1505      OT SHORT TERM GOAL #1   Title I with HEP- due 12/26/16   Time 4   Period Weeks   Status Achieved     OT SHORT TERM GOAL #2   Title Pt will verbalize understanding of adapted strategies for ADLs/IADLS and sensory precautions.   Status Achieved     OT SHORT TERM GOAL #3   Title Pt will demonstrate improved ADL performance as evidenced by  decreasing 3  button/ unbutton time to 30 secs or less.   Baseline 35.50 secs   Time 4   Period Weeks   Status Achieved  22.53 secs     OT SHORT TERM GOAL #4   Title Pt will increase bilateral standing functional reach to greater than or equal to 10 inches in order to decrease fall risk and increase confidence for ADLs.   Time 4   Period Weeks   Status Achieved  rigt 10.5, left 10.5            OT Long Term Goals - 12/31/16 1602      OT LONG TERM GOAL #1   Title Pt will perform mod complex home management activities at a modified independent level .-01/25/17   Time 8   Period Weeks   Status On-going     OT LONG TERM GOAL #2   Title Pt will demonstrate improved fine motor coordination for ADLs/IADLS as  evidenced by decreasing bilateral 9 hole peg test score by 3 secs.   Time 8   Period Weeks   Status On-going     OT LONG TERM GOAL #3   Title Pt will increase LUE grip strength to 45 lbs or greater for increased ease with IADLs.- upgraded Pt will increase LUE grip strength to 55 lbs.   Time 8   Period Weeks   Status Revised   goal met 50 lbs     OT LONG TERM GOAL #4   Title Pt will demonstrate improved bilateral strength and functional use as evidenced by retrieving a 3 lbs weight at 110 shoulder flexion for right UE  and left UE   Time 8   Period Weeks   Status New               Plan - 01/03/17 1235    Clinical Impression Statement Pt is progressing towards goals. Pt demonstrates overall increased  UE strength.   Rehab Potential Good   OT Frequency 2x / week   OT Duration 8 weeks   OT Treatment/Interventions Balance training;Self-care/ADL training;DME and/or AE instruction;Patient/family education;Therapeutic exercises;Therapeutic exercise;Therapeutic activities;Passive range of motion;Neuromuscular education;Functional Mobility Training;Manual Therapy;Electrical Stimulation;Parrafin;Energy conservation;Cryotherapy;Moist Heat;Splinting   Plan continue to address fine motor  coordination, dynamic standing balance, core stability   Consulted and Agree with Plan of Care Patient      Patient will benefit from skilled therapeutic intervention in order to improve the following deficits and impairments:  Abnormal gait, Pain, Impaired sensation, Decreased coordination, Decreased mobility, Decreased endurance, Decreased range of motion, Decreased strength, Impaired tone, Impaired UE functional use, Difficulty walking, Decreased safety awareness, Decreased knowledge of precautions, Decreased balance  Visit Diagnosis: Muscle weakness (generalized)  Other  lack of coordination  Other disturbances of skin sensation      G-Codes - 2017/01/16 1242    Functional Assessment Tool Used (Outpatient only) Grip strength: RUE 65 lbs, LUE 50 lbs, 3 button unbutton: 22.53 secs, clinical impressions   Functional Limitation Carrying, moving and handling objects   Carrying, Moving and Handling Objects Current Status [phone]) At least 1 percent but less than 20 percent impaired, limited or restricted   Carrying, Moving and Handling Objects Goal Status ([id number]) At least 1 percent but less than 20 percent impaired, limited or restricted    Occupational Therapy Progress Note  Dates of Reporting Period: 11/27/16 to 01/02/17  Objective Reports of Subjective Statement: see below  Objective Measurements: grip strength, 3 buton / unbutton  Goal Update: see goals, pt is progressing towards all remaining goals.  Plan: continue to work towards unmet goals.  Reason Skilled Services are Required: Pt can benefit from skilled occupational therapy to address: coordination, strength, balance in order to maximize safety and independence with ADLs/IADLs.  Problem List Patient Active Problem List   Diagnosis Date Noted  . Port catheter in place 11/06/2016  . Peripheral neuropathy 09/11/2016  . Atypical pneumonia 06/18/2016  . Acute respiratory failure with hypoxia ([place]) 06/18/2016  . Fever 06/16/2016   . Lactic acidosis 06/16/2016  . Dehydration 06/16/2016  . Encounter for antineoplastic chemotherapy 04/17/2016  . NHL (non-Hodgkin's lymphoma) ([place]) 04/05/2016  . Diffuse large B-cell lymphoma of lymph nodes of multiple regions ([place]) 04/05/2016  . Lung mass 03/13/2016  . Post-operative state 03/08/2014  . Left inguinal hernia 05/08/2013    Tiyana Galla 16-Jan-2017, 12:46 PM  [facility] [address] [place], [place], [address] Phone: [phone]   Fax:  [phone]  Name: KACEE KOREN MRN: [id number] Date of Birth: Jan 07, 1936

## 2017-01-07 DIAGNOSIS — R946 Abnormal results of thyroid function studies: Secondary | ICD-10-CM | POA: Diagnosis not present

## 2017-01-07 DIAGNOSIS — E039 Hypothyroidism, unspecified: Secondary | ICD-10-CM | POA: Diagnosis not present

## 2017-01-08 ENCOUNTER — Encounter: Payer: Self-pay | Admitting: Physical Therapy

## 2017-01-08 ENCOUNTER — Ambulatory Visit: Payer: Medicare Other | Admitting: Occupational Therapy

## 2017-01-08 ENCOUNTER — Ambulatory Visit: Payer: Medicare Other | Admitting: Physical Therapy

## 2017-01-08 DIAGNOSIS — M6281 Muscle weakness (generalized): Secondary | ICD-10-CM

## 2017-01-08 DIAGNOSIS — R262 Difficulty in walking, not elsewhere classified: Secondary | ICD-10-CM

## 2017-01-08 DIAGNOSIS — R2681 Unsteadiness on feet: Secondary | ICD-10-CM

## 2017-01-08 DIAGNOSIS — R296 Repeated falls: Secondary | ICD-10-CM

## 2017-01-08 DIAGNOSIS — M21371 Foot drop, right foot: Secondary | ICD-10-CM

## 2017-01-08 DIAGNOSIS — M21372 Foot drop, left foot: Secondary | ICD-10-CM

## 2017-01-08 NOTE — Therapy (Signed)
[facility] [address] [place], [place], [address] Phone: [phone]   Fax:  [phone]  Occupational Therapy Treatment  Patient Details  Name: Seth Gilbert MRN: [id number] Date of Birth: Mar 18, 1936 Referring Provider: Dr. Jaynee Eagles  Encounter Date: 01/08/2017      OT End of Session - 01/08/17 1412    Visit Number 10   Number of Visits 17   Date for OT Re-Evaluation 01/25/17   Authorization Type Blue MCR   Authorization - Visit Number 10   Authorization - Number of Visits 10   OT Start Time 4196   OT Stop Time 1445   OT Time Calculation (min) 42 min   Activity Tolerance Patient tolerated treatment well   Behavior During Therapy [facility] for tasks assessed/performed      Past Medical History:  Diagnosis Date  . Cancer ([place])    NHL  . Diverticulitis   . GERD (gastroesophageal reflux disease)    Heartburn at times  . Heart murmur    "comes and goes" onset 4th grade - last time anyone heard it was 01/2014  . History of hiatal hernia   . History of kidney stones   . HOH (hard of hearing)   . Non-Hodgkin lymphoma ([place])   . Peripheral neuropathy 09/11/2016  . PONV (postoperative nausea and vomiting)   . Rosacea   . Wears glasses     Past Surgical History:  Procedure Laterality Date  . APPENDECTOMY     2-3 yrs old  . COLONOSCOPY    . HEMORROIDECTOMY     inflammed anal gland removed  . INGUINAL HERNIA REPAIR Left 02/11/2014   Procedure: LEFT INGUINAL HERNIA REPAIR WITH MESH ;  Surgeon: Joyice Faster. Cornett, MD;  Location[facility];  Service: General;  Laterality: Left;  . INSERTION OF MESH N/A 02/11/2014   Procedure: INSERTION OF MESH;  Surgeon: Joyice Faster. Cornett, MD;  Location: [facility];  Service: General;  Laterality: N/A;  . PORTACATH PLACEMENT Left 04/10/2016   Procedure: INSERTION PORT-A-CATH into LEFT Subclavian Vein with guidance from ultrasound and flouroscopy;  Surgeon:  Grace Isaac, MD;  Location: [facility];  Service: Thoracic;  Laterality: Left;  . TONSILLECTOMY    . VIDEO BRONCHOSCOPY WITH ENDOBRONCHIAL ULTRASOUND N/A 03/30/2016   Procedure: VIDEO BRONCHOSCOPY WITH ENDOBRONCHIAL ULTRASOUND,WITH TRANSBRONCHIAL BIOPSY;  Surgeon: Grace Isaac, MD;  Location: [facility];  Service: Thoracic;  Laterality: N/A;    There were no vitals filed for this visit.      Subjective Assessment - 01/08/17 1410    Subjective  Pt reports performing some preparation    Pertinent History  chemo induced peripheral neuropathy with decreased coordination and multiple falls. Pt's PMH: lymphoma and chemotherapy, hypothyroidism.   Patient Stated Goals improve coordination, strength   Currently in Pain? No/denies   Pain Onset In the past 7 days             Treatment: O'connor pegs using RUE to place with tweezes and fingertips, LUE using fingertips to place then removing with tweezers Gripper for sustained grip Level 4 for RUE, Level 3 LUE to pick up 1 inch blocks, min-mod difficulty and 1 rest break required with each hand. Attempted quadraped lifting opposite arm and leg however pt was unable to tolerate heavy weightbearing through left wrist, tall kneeling to roll ball forwards and backwards x 10 reps, minguard. Prone on elbows reaching to each side, min v.c./ difficulty for core stability/ UE  strength. Arm bike x 8 mins level 5 for conditioning pt maintained 45-55 rpm Standing on mod difficult non compliant surface while reaching overhead to retrive large pegs with RUE while reaching outside of BOS, min A required at times for balance.                   OT Short Term Goals - 12/31/16 1505      OT SHORT TERM GOAL #1   Title I with HEP- due 12/26/16   Time 4   Period Weeks   Status Achieved     OT SHORT TERM GOAL #2   Title Pt will verbalize understanding of adapted strategies for ADLs/IADLS and sensory precautions.   Status Achieved     OT SHORT TERM  GOAL #3   Title Pt will demonstrate improved ADL performance as evidenced by  decreasing 3 button/ unbutton time to 30 secs or less.   Baseline 35.50 secs   Time 4   Period Weeks   Status Achieved  22.53 secs     OT SHORT TERM GOAL #4   Title Pt will increase bilateral standing functional reach to greater than or equal to 10 inches in order to decrease fall risk and increase confidence for ADLs.   Time 4   Period Weeks   Status Achieved  rigt 10.5, left 10.5            OT Long Term Goals - 01/08/17 1409      OT LONG TERM GOAL #1   Title Pt will perform mod complex home management activities at a modified independent level .-01/25/17   Time 8   Period Weeks   Status On-going     OT LONG TERM GOAL #2   Title Pt will demonstrate improved fine motor coordination for ADLs/IADLS as  evidenced by decreasing bilateral 9 hole peg test score by 3 secs.   Time 8   Period Weeks   Status On-going     OT LONG TERM GOAL #3   Title Pt will increase LUE grip strength to 45 lbs or greater for increased ease with IADLs.- upgraded Pt will increase LUE grip strength to 55 lbs.   Time 8   Period Weeks   Status Revised   goal met 50 lbs     OT LONG TERM GOAL #4   Title Pt will demonstrate improved bilateral strength and functional use as evidenced by retrieving a 3 lbs weight at 110 shoulder flexion for right UE  and left UE   Time 8   Period Weeks   Status New             Patient will benefit from skilled therapeutic intervention in order to improve the following deficits and impairments:     Visit Diagnosis: Muscle weakness (generalized)  Difficulty in walking, not elsewhere classified    Problem List Patient Active Problem List   Diagnosis Date Noted  . Port catheter in place 11/06/2016  . Peripheral neuropathy 09/11/2016  . Atypical pneumonia 06/18/2016  . Acute respiratory failure with hypoxia ([place]) 06/18/2016  . Fever 06/16/2016  . Lactic acidosis 06/16/2016  .  Dehydration 06/16/2016  . Encounter for antineoplastic chemotherapy 04/17/2016  . NHL (non-Hodgkin's lymphoma) ([place]) 04/05/2016  . Diffuse large B-cell lymphoma of lymph nodes of multiple regions ([place]) 04/05/2016  . Lung mass 03/13/2016  . Post-operative state 03/08/2014  . Left inguinal hernia 05/08/2013    , 01/08/2017, 2:18 PM  [facility] [address] [place], [place], [address] Phone: [phone]   Fax:  [phone]  Name: Seth Gilbert MRN: [id number] Date of Birth: 07/19/1936

## 2017-01-09 DIAGNOSIS — R5383 Other fatigue: Secondary | ICD-10-CM | POA: Diagnosis not present

## 2017-01-09 DIAGNOSIS — E039 Hypothyroidism, unspecified: Secondary | ICD-10-CM | POA: Diagnosis not present

## 2017-01-09 NOTE — Therapy (Signed)
[facility] [address] [place], [place], [address] Phone: [phone]   Fax:  [phone]  Physical Therapy Treatment  Patient Details  Name: Seth Gilbert MRN: [id number] Date of Birth: 06/28/1936 Referring Provider: Melvenia Beam, MD  Encounter Date: 01/08/2017      PT End of Session - 01/08/17 1450    Visit Number 12   Number of Visits 17   Date for PT Re-Evaluation 01/26/17   Authorization Type Blue [employer] Blue [employer] [place] code and progress note 10th visit   PT Start Time 1449   PT Stop Time 1530   PT Time Calculation (min) 41 min   Equipment Utilized During Treatment Gait belt   Activity Tolerance Patient tolerated treatment well   Behavior During Therapy [facility] for tasks assessed/performed      Past Medical History:  Diagnosis Date  . Cancer ([place])    NHL  . Diverticulitis   . GERD (gastroesophageal reflux disease)    Heartburn at times  . Heart murmur    "comes and goes" onset 4th grade - last time anyone heard it was 01/2014  . History of hiatal hernia   . History of kidney stones   . HOH (hard of hearing)   . Non-Hodgkin lymphoma ([place])   . Peripheral neuropathy 09/11/2016  . PONV (postoperative nausea and vomiting)   . Rosacea   . Wears glasses     Past Surgical History:  Procedure Laterality Date  . APPENDECTOMY     2-3 yrs old  . COLONOSCOPY    . HEMORROIDECTOMY     inflammed anal gland removed  . INGUINAL HERNIA REPAIR Left 02/11/2014   Procedure: LEFT INGUINAL HERNIA REPAIR WITH MESH ;  Surgeon: Joyice Faster. Cornett, MD;  Location: [facility];  Service: General;  Laterality: Left;  . INSERTION OF MESH N/A 02/11/2014   Procedure: INSERTION OF MESH;  Surgeon: Joyice Faster. Cornett, MD;  Location: [facility];  Service: General;  Laterality: N/A;  . PORTACATH PLACEMENT Left 04/10/2016   Procedure: INSERTION PORT-A-CATH into LEFT Subclavian Vein with guidance from  ultrasound and flouroscopy;  Surgeon: Grace Isaac, MD;  Location: [facility];  Service: Thoracic;  Laterality: Left;  . TONSILLECTOMY    . VIDEO BRONCHOSCOPY WITH ENDOBRONCHIAL ULTRASOUND N/A 03/30/2016   Procedure: VIDEO BRONCHOSCOPY WITH ENDOBRONCHIAL ULTRASOUND,WITH TRANSBRONCHIAL BIOPSY;  Surgeon: Grace Isaac, MD;  Location: [facility];  Service: Thoracic;  Laterality: N/A;    There were no vitals filed for this visit.      Subjective Assessment - 01/08/17 1450    Subjective No new complaints. No pain or falls to report.    Patient is accompained by: Family member   Pertinent History lymphoma with chemo, HOH   Limitations Walking;Other (comment)   Patient Stated Goals Get back to normal function.     Currently in Pain? No/denies   Pain Score 0-No pain           [facility] Adult PT Treatment/Exercise - 01/08/17 1452      Ambulation/Gait   Ambulation/Gait Yes   Ambulation/Gait Assistance 4: Min guard;5: Supervision   Ambulation/Gait Assistance Details PTA set faster pace than pt's self selected pace. had pt scan enviroment with gait as well. increased assistance needed on complaint surfaces with scanning.    Ambulation Distance (Feet) 1000 Feet   Assistive device None   Gait Pattern Step-through pattern;Decreased dorsiflexion - right;Decreased dorsiflexion - left;Poor foot clearance - left;Poor foot  clearance - right   Ambulation Surface Level;Indoor;Unlevel;Outdoor;Paved;Grass             Balance Exercises - 01/08/17 1521      Balance Exercises: Standing   Standing Eyes Opened Wide (BOA);Foam/compliant surface;Limitations;Narrow base of support (BOS)   Standing Eyes Closed Narrow base of support (BOS);Wide (BOA);Head turns;Foam/compliant surface;Other reps (comment);30 secs;Limitations   Balance Beam on wooden beam with light UE support on bars: toes on beam with heels in air side stepping left<>right x 3 laps each way, with heels on beam focusing on not letting toes touch  floor with side stepping left<>right x 3 laps, tandem walking with heel taps before each step x 2 laps forward     Balance Exercises: Standing   Standing Eyes Opened Limitations on ball part of bosu with feet together: anterior/posterior weight shifting with EO, emphasis on tall posture; holding steady- alternating UE raises x 10 each side, progressing to bil UE raises x 10 reps. min to mod assist for balance with cues on posture and weight shifting to assist balance; performed all of these on inverted bosu with feet apart with min guard to min assist for balance.    Standing Eyes Closed Limitations feet together on ball part of BOSU: holding steady EC no head movements, progressing to EC with head movements left<>right, up<>down and diagnols both ways. min to mod assist for balance; performed the same tasks on inverted bosu with wide stance with min guard to min assist for balance.              PT Short Term Goals - 12/19/16 1238      PT SHORT TERM GOAL #1   Title (TARGET DATE FOR STG 12/19/16)  Pt will participate in FGA with LTG to be set   Status Achieved     PT SHORT TERM GOAL #2   Title Pt will improve functional LE strength as indicated by decrease in 5x sit to stand score of <10 seconds   Baseline 12/19/16: 11.10 sec's today, improved from 12.32 sec's at eval   Status Partially Met     PT SHORT TERM GOAL #3   Title Pt will improve safety with gait as indicated by improvement in gait velocity to >3.5 ft/sec   Baseline 12/19/16: 3.24 ft/sec today, improved from 3.07 ft/sec at eval   Status Partially Met     PT SHORT TERM GOAL #4   Title Pt will negotiate 8 stairs, alternating sequence with no rail and supervision    Baseline 12/19/16: met today   Status Achieved     PT SHORT TERM GOAL #5   Title Pt will be able to maintain 30 seconds on all conditions on MCTSIB to indicate improved use of all sensory systems   Baseline 12/19/16: met today   Status Achieved           PT  Long Term Goals - 12/31/16 1715      PT LONG TERM GOAL #1   Title (REVISED TARGET DATE FOR ALL LTG 01/26/17) Pt will demonstrate independence with LE strengthening and balance HEP   Status Revised     PT LONG TERM GOAL #2   Title Pt will improve FGA score to >23/30 to decrease falls risk    Baseline 22/30 on 12/31/16   Status On-going     PT LONG TERM GOAL #3   Title Pt will improve safety with community gait as indicated by gait velocity of >27f/sec   Baseline 3.24 ft/sec  on 12/31/16   Status On-going     PT LONG TERM GOAL #4   Title Pt will ambulate >1000 over outdoor/uneven terrain and up/down curb at Mod I level and no evidence of LOB and improved foot clearance   Status On-going     PT LONG TERM GOAL #5   Title Pt will improve ABC score to >80% confidence   Baseline 64.4%   Status On-going           Plan - 01/08/17 1451    Clinical Impression Statement today's skilled session focused on high level balance with no issues reported. Pt is progressing towards goals and should benefit from continued PT to progress toward unmet goals.    Rehab Potential Good   Clinical Impairments Affecting Rehab Potential lymphoma, chemo   PT Frequency 2x / week   PT Duration 8 weeks   PT Treatment/Interventions ADLs/Self Care Home Management;DME Instruction;Gait training;Stair training;Functional mobility training;Therapeutic activities;Therapeutic exercise;Balance training;Neuromuscular re-education;Patient/family education;Orthotic Fit/Training;Energy conservation   PT Next Visit Plan Continue balance training with hip ABD, ankle strengthening, SLS, compliant surfaces, vision removed.   Consulted and Agree with Plan of Care Patient      Patient will benefit from skilled therapeutic intervention in order to improve the following deficits and impairments:  Abnormal gait, Decreased activity tolerance, Decreased balance, Decreased endurance, Decreased strength, Difficulty walking, Impaired  sensation  Visit Diagnosis: Muscle weakness (generalized)  Difficulty in walking, not elsewhere classified  Foot drop, left  Foot drop, right  Repeated falls  Unsteadiness on feet     Problem List Patient Active Problem List   Diagnosis Date Noted  . Port catheter in place 11/06/2016  . Peripheral neuropathy 09/11/2016  . Atypical pneumonia 06/18/2016  . Acute respiratory failure with hypoxia ([place]) 06/18/2016  . Fever 06/16/2016  . Lactic acidosis 06/16/2016  . Dehydration 06/16/2016  . Encounter for antineoplastic chemotherapy 04/17/2016  . NHL (non-Hodgkin's lymphoma) ([place]) 04/05/2016  . Diffuse large B-cell lymphoma of lymph nodes of multiple regions ([place]) 04/05/2016  . Lung mass 03/13/2016  . Post-operative state 03/08/2014  . Left inguinal hernia 05/08/2013    Willow Ora 01/09/2017, 5:55 PM  [facility] [address] [place], [place], [address] Phone: [phone]   Fax:  [phone]  Name: Seth Gilbert MRN: [id number] Date of Birth: [birth year]

## 2017-01-10 ENCOUNTER — Encounter: Payer: Self-pay | Admitting: Physical Therapy

## 2017-01-10 ENCOUNTER — Ambulatory Visit: Payer: Medicare Other | Admitting: Occupational Therapy

## 2017-01-10 ENCOUNTER — Ambulatory Visit: Payer: Medicare Other | Admitting: Physical Therapy

## 2017-01-10 DIAGNOSIS — R296 Repeated falls: Secondary | ICD-10-CM

## 2017-01-10 DIAGNOSIS — M6281 Muscle weakness (generalized): Secondary | ICD-10-CM | POA: Diagnosis not present

## 2017-01-10 DIAGNOSIS — M21371 Foot drop, right foot: Secondary | ICD-10-CM

## 2017-01-10 DIAGNOSIS — R2681 Unsteadiness on feet: Secondary | ICD-10-CM

## 2017-01-10 DIAGNOSIS — R208 Other disturbances of skin sensation: Secondary | ICD-10-CM

## 2017-01-10 DIAGNOSIS — R262 Difficulty in walking, not elsewhere classified: Secondary | ICD-10-CM

## 2017-01-10 DIAGNOSIS — M21372 Foot drop, left foot: Secondary | ICD-10-CM

## 2017-01-10 DIAGNOSIS — R278 Other lack of coordination: Secondary | ICD-10-CM

## 2017-01-10 NOTE — Therapy (Signed)
[facility] [address] [place], [place], [address] Phone: [phone]   Fax:  [phone]  Occupational Therapy Treatment  Patient Details  Name: Seth Gilbert MRN: [id number] Date of Birth: 1936-06-03 Referring Provider: Dr. Jaynee Eagles  Encounter Date: 01/10/2017      OT End of Session - 01/10/17 1559    Visit Number 12   Number of Visits 17   Date for OT Re-Evaluation 01/25/17   Authorization Type Blue M[employer]   Authorization - Visit Number 12   Authorization - Number of Visits 20   OT Start Time 4193   OT Stop Time 1615   OT Time Calculation (min) 42 min   Activity Tolerance Patient tolerated treatment well   Behavior During Therapy [facility] for tasks assessed/performed      Past Medical History:  Diagnosis Date  . Cancer ([place])    NHL  . Diverticulitis   . GERD (gastroesophageal reflux disease)    Heartburn at times  . Heart murmur    "comes and goes" onset 4th grade - last time anyone heard it was 01/2014  . History of hiatal hernia   . History of kidney stones   . HOH (hard of hearing)   . Non-Hodgkin lymphoma ([place])   . Peripheral neuropathy 09/11/2016  . PONV (postoperative nausea and vomiting)   . Rosacea   . Wears glasses     Past Surgical History:  Procedure Laterality Date  . APPENDECTOMY     2-3 yrs old  . COLONOSCOPY    . HEMORROIDECTOMY     inflammed anal gland removed  . INGUINAL HERNIA REPAIR Left 02/11/2014   Procedure: LEFT INGUINAL HERNIA REPAIR WITH MESH ;  Surgeon: Joyice Faster. Cornett, MD;  Location: [facility];  Service: General;  Laterality: Left;  . INSERTION OF MESH N/A 02/11/2014   Procedure: INSERTION OF MESH;  Surgeon: Joyice Faster. Cornett, MD;  Location: [facility];  Service: General;  Laterality: N/A;  . PORTACATH PLACEMENT Left 04/10/2016   Procedure: INSERTION PORT-A-CATH into LEFT Subclavian Vein with guidance from ultrasound and flouroscopy;  Surgeon:  Grace Isaac, MD;  Location: [facility];  Service: Thoracic;  Laterality: Left;  . TONSILLECTOMY    . VIDEO BRONCHOSCOPY WITH ENDOBRONCHIAL ULTRASOUND N/A 03/30/2016   Procedure: VIDEO BRONCHOSCOPY WITH ENDOBRONCHIAL ULTRASOUND,WITH TRANSBRONCHIAL BIOPSY;  Surgeon: Grace Isaac, MD;  Location: [facility];  Service: Thoracic;  Laterality: N/A;    There were no vitals filed for this visit.      Subjective Assessment - 01/10/17 1534    Subjective  Pt reports mild pain at left [facility]   Pertinent History  chemo induced peripheral neuropathy with decreased coordination and multiple falls. Pt's PMH: lymphoma and chemotherapy, hypothyroidism.   Patient Stated Goals improve coordination, strength   Currently in Pain? Yes   Pain Score 2    Pain Location --  thumb    Pain Orientation Left   Pain Descriptors / Indicators Aching   Pain Type Acute pain   Pain Onset In the past 7 days   Pain Frequency Intermittent   Aggravating Factors  weightbearing   Pain Relieving Factors rest   Multiple Pain Sites Yes            Treatment:Grooved pegs using RUE then LUE  to place with tweezers and fingertip,  then removing with tweezers min difficulty tall kneeling to roll ball forwards and backwards x 10 reps, minguard. Prone on elbows reaching  to forwards to each  side, min v.c./ difficulty for core stability/ UE strength. Wall push ups x 10 reps min v.c Arm bike x 8 mins level 5 for conditioning pt maintained 45-55 rpm                    OT Short Term Goals - 12/31/16 1505      OT SHORT TERM GOAL #1   Title I with HEP- due 12/26/16   Time 4   Period Weeks   Status Achieved     OT SHORT TERM GOAL #2   Title Pt will verbalize understanding of adapted strategies for ADLs/IADLS and sensory precautions.   Status Achieved     OT SHORT TERM GOAL #3   Title Pt will demonstrate improved ADL performance as evidenced by  decreasing 3 button/ unbutton time to 30 secs or less.   Baseline  35.50 secs   Time 4   Period Weeks   Status Achieved  22.53 secs     OT SHORT TERM GOAL #4   Title Pt will increase bilateral standing functional reach to greater than or equal to 10 inches in order to decrease fall risk and increase confidence for ADLs.   Time 4   Period Weeks   Status Achieved  rigt 10.5, left 10.5            OT Long Term Goals - 01/08/17 1409      OT LONG TERM GOAL #1   Title Pt will perform mod complex home management activities at a modified independent level .-01/25/17   Time 8   Period Weeks   Status On-going     OT LONG TERM GOAL #2   Title Pt will demonstrate improved fine motor coordination for ADLs/IADLS as  evidenced by decreasing bilateral 9 hole peg test score by 3 secs.   Time 8   Period Weeks   Status On-going     OT LONG TERM GOAL #3   Title Pt will increase LUE grip strength to 45 lbs or greater for increased ease with IADLs.- upgraded Pt will increase LUE grip strength to 55 lbs.   Time 8   Period Weeks   Status Revised   goal met 50 lbs     OT LONG TERM GOAL #4   Title Pt will demonstrate improved bilateral strength and functional use as evidenced by retrieving a 3 lbs weight at 110 shoulder flexion for right UE  and left UE   Time 8   Period Weeks   Status New               Plan - 01/10/17 1601    Clinical Impression Statement Pt demonstrates good overall progress. Pt demonstrates improving coordination and UE functional use.   Rehab Potential Good   OT Frequency 2x / week   OT Duration 8 weeks   OT Treatment/Interventions Balance training;Self-care/ADL training;DME and/or AE instruction;Patient/family education;Therapeutic exercises;Therapeutic exercise;Therapeutic activities;Passive range of motion;Neuromuscular education;Functional Mobility Training;Manual Therapy;Electrical Stimulation;Parrafin;Energy conservation;Cryotherapy;Moist Heat;Splinting   Plan continue to address dynamic standing balance, coordination check  goals when pt returns to therapy   Consulted and Agree with Plan of Care Patient      Patient will benefit from skilled therapeutic intervention in order to improve the following deficits and impairments:  Abnormal gait, Pain, Impaired sensation, Decreased coordination, Decreased mobility, Decreased endurance, Decreased range of motion, Decreased strength, Impaired tone, Impaired UE functional use, Difficulty walking, Decreased safety awareness, Decreased knowledge of precautions,  Decreased balance  Visit Diagnosis: Muscle weakness (generalized)  Other lack of coordination  Other disturbances of skin sensation    Problem List Patient Active Problem List   Diagnosis Date Noted  . Port catheter in place 11/06/2016  . Peripheral neuropathy 09/11/2016  . Atypical pneumonia 06/18/2016  . Acute respiratory failure with hypoxia ([place]) 06/18/2016  . Fever 06/16/2016  . Lactic acidosis 06/16/2016  . Dehydration 06/16/2016  . Encounter for antineoplastic chemotherapy 04/17/2016  . NHL (non-Hodgkin's lymphoma) ([place]) 04/05/2016  . Diffuse large B-cell lymphoma of lymph nodes of multiple regions ([place]) 04/05/2016  . Lung mass 03/13/2016  . Post-operative state 03/08/2014  . Left inguinal hernia 05/08/2013    Audra Bellard 01/10/2017, 4:17 PM  [facility] [address] [place], [place], [address] Phone: [phone]   Fax:  [phone]  Name: Seth Gilbert MRN: [id number] Date of Birth: 05/23/1936

## 2017-01-11 NOTE — Therapy (Signed)
[facility] [address] [place], [place], [address] Phone: [phone]   Fax:  [phone]  Physical Therapy Treatment  Patient Details  Name: Seth Gilbert MRN: [id number] Date of Birth: [birth year] Referring Provider: Melvenia Beam, MD  Encounter Date: 01/10/2017      PT End of Session - 01/10/17 1454    Visit Number 13   Number of Visits 17   Date for PT Re-Evaluation 01/26/17   Authorization Type Blue Cross Blue Shield [place] code and progress note 10th visit   PT Start Time 1449   PT Stop Time 1530   PT Time Calculation (min) 41 min   Equipment Utilized During Treatment Gait belt   Activity Tolerance Patient tolerated treatment well   Behavior During Therapy [facility] for tasks assessed/performed      Past Medical History:  Diagnosis Date  . Cancer ([place])    NHL  . Diverticulitis   . GERD (gastroesophageal reflux disease)    Heartburn at times  . Heart murmur    "comes and goes" onset 4th grade - last time anyone heard it was 01/2014  . History of hiatal hernia   . History of kidney stones   . HOH (hard of hearing)   . Non-Hodgkin lymphoma ([place])   . Peripheral neuropathy 09/11/2016  . PONV (postoperative nausea and vomiting)   . Rosacea   . Wears glasses     Past Surgical History:  Procedure Laterality Date  . APPENDECTOMY     2-3 yrs old  . COLONOSCOPY    . HEMORROIDECTOMY     inflammed anal gland removed  . INGUINAL HERNIA REPAIR Left 02/11/2014   Procedure: LEFT INGUINAL HERNIA REPAIR WITH MESH ;  Surgeon: Joyice Faster. Cornett, MD;  Location: [facility];  Service: General;  Laterality: Left;  . INSERTION OF MESH N/A 02/11/2014   Procedure: INSERTION OF MESH;  Surgeon: Joyice Faster. Cornett, MD;  Location: [facility];  Service: General;  Laterality: N/A;  . PORTACATH PLACEMENT Left 04/10/2016   Procedure: INSERTION PORT-A-CATH into LEFT Subclavian Vein with guidance from  ultrasound and flouroscopy;  Surgeon: Grace Isaac, MD;  Location: [facility];  Service: Thoracic;  Laterality: Left;  . TONSILLECTOMY    . VIDEO BRONCHOSCOPY WITH ENDOBRONCHIAL ULTRASOUND N/A 03/30/2016   Procedure: VIDEO BRONCHOSCOPY WITH ENDOBRONCHIAL ULTRASOUND,WITH TRANSBRONCHIAL BIOPSY;  Surgeon: Grace Isaac, MD;  Location: [facility];  Service: Thoracic;  Laterality: N/A;    There were no vitals filed for this visit.      Subjective Assessment - 01/10/17 1454    Subjective No new complaints. No pain or falls to report.    Patient is accompained by: Family member   Pertinent History lymphoma with chemo, HOH   Limitations Walking;Other (comment)   Patient Stated Goals Get back to normal function.     Currently in Pain? No/denies   Pain Score 0-No pain           [facility] Adult PT Treatment/Exercise - 01/10/17 1455      High Level Balance   High Level Balance Activities Marching forwards;Marching backwards  toe walk  fwd/bwd, heel walk fwd/bwd   High Level Balance Comments red mats with no UE support: 3 laps each/each way with min guard to min assist for balance. cues on posture and ex form.           Balance Exercises - 01/10/17 1521      Balance Exercises: Standing  SLS with Vectors Foam/compliant surface;Other reps (comment);Limitations     Balance Exercises: Standing   SLS with Vectors Limitations cones along sides of both red mats: tandem gait forward with lateral taps to cones prior to placing foot ahead of other foot x 4 laps with min/mod assist for balance; cones along center of both red mats: fwd toe taps to each cone with lateral stepping/weaving around cones, alternating sides, x 4 laps forward with min assist for balance; cones along single edge of both red mats: alternating fwd toe taps to each cone with side stepping left<>right x 1 lap each way, followed by alternating fwd double toe taps to each cone with side stepping left<>right x 1 each way, min assist for  balance. cues on posture and weight shifitng with all cone activities to assit with balance.                          PT Short Term Goals - 12/19/16 1238      PT SHORT TERM GOAL #1   Title (TARGET DATE FOR STG 12/19/16)  Pt will participate in FGA with LTG to be set   Status Achieved     PT SHORT TERM GOAL #2   Title Pt will improve functional LE strength as indicated by decrease in 5x sit to stand score of <10 seconds   Baseline 12/19/16: 11.10 sec's today, improved from 12.32 sec's at eval   Status Partially Met     PT SHORT TERM GOAL #3   Title Pt will improve safety with gait as indicated by improvement in gait velocity to >3.5 ft/sec   Baseline 12/19/16: 3.24 ft/sec today, improved from 3.07 ft/sec at eval   Status Partially Met     PT SHORT TERM GOAL #4   Title Pt will negotiate 8 stairs, alternating sequence with no rail and supervision    Baseline 12/19/16: met today   Status Achieved     PT SHORT TERM GOAL #5   Title Pt will be able to maintain 30 seconds on all conditions on MCTSIB to indicate improved use of all sensory systems   Baseline 12/19/16: met today   Status Achieved           PT Long Term Goals - 12/31/16 1715      PT LONG TERM GOAL #1   Title (REVISED TARGET DATE FOR ALL LTG 01/26/17) Pt will demonstrate independence with LE strengthening and balance HEP   Status Revised     PT LONG TERM GOAL #2   Title Pt will improve FGA score to >23/30 to decrease falls risk    Baseline 22/30 on 12/31/16   Status On-going     PT LONG TERM GOAL #3   Title Pt will improve safety with community gait as indicated by gait velocity of >59f/sec   Baseline 3.24 ft/sec on 12/31/16   Status On-going     PT LONG TERM GOAL #4   Title Pt will ambulate >1000 over outdoor/uneven terrain and up/down curb at Mod I level and no evidence of LOB and improved foot clearance   Status On-going     PT LONG TERM GOAL #5   Title Pt will improve ABC score to >80% confidence   Baseline  64.4%   Status On-going            Plan - 01/11/17 1926    Clinical Impression Statement Today's skilled session continued to address high level balance activities  with no issues reported. Pt is progressing towards goals and should benefit from continued PT to progress toward unmet goals.   Rehab Potential Good   Clinical Impairments Affecting Rehab Potential lymphoma, chemo   PT Frequency 2x / week   PT Duration 8 weeks   PT Treatment/Interventions ADLs/Self Care Home Management;DME Instruction;Gait training;Stair training;Functional mobility training;Therapeutic activities;Therapeutic exercise;Balance training;Neuromuscular re-education;Patient/family education;Orthotic Fit/Training;Energy conservation   PT Next Visit Plan Continue balance training with hip ABD, ankle strengthening, SLS, compliant surfaces, vision removed.   Consulted and Agree with Plan of Care Patient      Patient will benefit from skilled therapeutic intervention in order to improve the following deficits and impairments:  Abnormal gait, Decreased activity tolerance, Decreased balance, Decreased endurance, Decreased strength, Difficulty walking, Impaired sensation  Visit Diagnosis: Muscle weakness (generalized)  Difficulty in walking, not elsewhere classified  Foot drop, left  Foot drop, right  Repeated falls  Unsteadiness on feet     Problem List Patient Active Problem List   Diagnosis Date Noted  . Port catheter in place 11/06/2016  . Peripheral neuropathy 09/11/2016  . Atypical pneumonia 06/18/2016  . Acute respiratory failure with hypoxia ([place]) 06/18/2016  . Fever 06/16/2016  . Lactic acidosis 06/16/2016  . Dehydration 06/16/2016  . Encounter for antineoplastic chemotherapy 04/17/2016  . NHL (non-Hodgkin's lymphoma) ([place]) 04/05/2016  . Diffuse large B-cell lymphoma of lymph nodes of multiple regions ([place]) 04/05/2016  . Lung mass 03/13/2016  . Post-operative state 03/08/2014  . Left  inguinal hernia 05/08/2013    Willow Ora, PTA, [facility] [address], [place], [place] [address] [phone] 01/11/17, 7:34 PM   Name: TAJAH SCHREINER MRN: [id number] Date of Birth: [birth year]

## 2017-01-23 ENCOUNTER — Ambulatory Visit: Payer: Medicare Other | Admitting: Physical Therapy

## 2017-01-23 ENCOUNTER — Encounter: Payer: Self-pay | Admitting: Physical Therapy

## 2017-01-23 ENCOUNTER — Ambulatory Visit: Payer: Medicare Other | Admitting: Occupational Therapy

## 2017-01-23 DIAGNOSIS — M6281 Muscle weakness (generalized): Secondary | ICD-10-CM

## 2017-01-23 DIAGNOSIS — R296 Repeated falls: Secondary | ICD-10-CM

## 2017-01-23 DIAGNOSIS — R262 Difficulty in walking, not elsewhere classified: Secondary | ICD-10-CM

## 2017-01-23 DIAGNOSIS — M21372 Foot drop, left foot: Secondary | ICD-10-CM

## 2017-01-23 DIAGNOSIS — M21371 Foot drop, right foot: Secondary | ICD-10-CM

## 2017-01-23 DIAGNOSIS — R2681 Unsteadiness on feet: Secondary | ICD-10-CM

## 2017-01-23 NOTE — Therapy (Signed)
[facility] [address] [place], [place], [address] Phone: [phone]   Fax:  [phone]  Physical Therapy Treatment  Patient Details  Name: Seth Gilbert MRN: [id number] Date of Birth: May 15, 1936 Referring Provider: Melvenia Beam, MD  Encounter Date: 01/23/2017      PT End of Session - 01/23/17 1104    Visit Number 14   Number of Visits 17   Date for PT Re-Evaluation 01/26/17   Authorization Type [employer] [place] code and progress note 10th visit   PT Start Time 1101   PT Stop Time 1143   PT Time Calculation (min) 42 min   Equipment Utilized During Treatment Gait belt   Activity Tolerance Patient tolerated treatment well   Behavior During Therapy WFL for tasks assessed/performed      Past Medical History:  Diagnosis Date  . Cancer ([place])    NHL  . Diverticulitis   . GERD (gastroesophageal reflux disease)    Heartburn at times  . Heart murmur    "comes and goes" onset 4th grade - last time anyone heard it was 01/2014  . History of hiatal hernia   . History of kidney stones   . HOH (hard of hearing)   . Non-Hodgkin lymphoma ([place])   . Peripheral neuropathy 09/11/2016  . PONV (postoperative nausea and vomiting)   . Rosacea   . Wears glasses     Past Surgical History:  Procedure Laterality Date  . APPENDECTOMY     2-3 yrs old  . COLONOSCOPY    . HEMORROIDECTOMY     inflammed anal gland removed  . INGUINAL HERNIA REPAIR Left 02/11/2014   Procedure: LEFT INGUINAL HERNIA REPAIR WITH MESH ;  Surgeon: Joyice Faster. Cornett, MD;  Location: [facility];  Service: General;  Laterality: Left;  . INSERTION OF MESH N/A 02/11/2014   Procedure: INSERTION OF MESH;  Surgeon: Joyice Faster. Cornett, MD;  Location: [facility];  Service: General;  Laterality: N/A;  . PORTACATH PLACEMENT Left 04/10/2016   Procedure: INSERTION PORT-A-CATH into LEFT Subclavian Vein with guidance from  ultrasound and flouroscopy;  Surgeon: Grace Isaac, MD;  Location: [facility];  Service: Thoracic;  Laterality: Left;  . TONSILLECTOMY    . VIDEO BRONCHOSCOPY WITH ENDOBRONCHIAL ULTRASOUND N/A 03/30/2016   Procedure: VIDEO BRONCHOSCOPY WITH ENDOBRONCHIAL ULTRASOUND,WITH TRANSBRONCHIAL BIOPSY;  Surgeon: Grace Isaac, MD;  Location: [facility];  Service: Thoracic;  Laterality: N/A;    There were no vitals filed for this visit.      Subjective Assessment - 01/23/17 1102    Subjective No new complaints. No pain or falls to report. Had a good beach trip with no issues.    Patient is accompained by: Family member   Pertinent History lymphoma with chemo, HOH   Limitations Walking;Other (comment)   Patient Stated Goals Get back to normal function.     Currently in Pain? No/denies   Pain Score 0-No pain            [facility] PT Assessment - 01/23/17 1106      Functional Gait  Assessment   Gait assessed  Yes   Gait Level Surface Walks 20 ft in less than 5.5 sec, no assistive devices, good speed, no evidence for imbalance, normal gait pattern, deviates no more than 6 in outside of the 12 in walkway width.   Change in Gait Speed Able to smoothly change walking speed without loss of balance or  gait deviation. Deviate no more than 6 in outside of the 12 in walkway width.   Gait with Horizontal Head Turns Performs head turns smoothly with no change in gait. Deviates no more than 6 in outside 12 in walkway width   Gait with Vertical Head Turns Performs head turns with no change in gait. Deviates no more than 6 in outside 12 in walkway width.   Gait and Pivot Turn Pivot turns safely within 3 sec and stops quickly with no loss of balance.   Step Over Obstacle Is able to step over 2 stacked shoe boxes taped together (9 in total height) without changing gait speed. No evidence of imbalance.   Gait with Narrow Base of Support Ambulates 4-7 steps.   Gait with Eyes Closed Walks 20 ft, uses assistive device,  slower speed, mild gait deviations, deviates 6-10 in outside 12 in walkway width. Ambulates 20 ft in less than 9 sec but greater than 7 sec.   Ambulating Backwards Walks 20 ft, no assistive devices, good speed, no evidence for imbalance, normal gait   Steps Alternating feet, no rail.   Total Score 27   FGA comment: 27/30= low fall risk           [facility] Adult PT Treatment/Exercise - 01/23/17 1106      Ambulation/Gait   Ambulation/Gait Yes   Ambulation/Gait Assistance 6: Modified independent (Device/Increase time)   Ambulation Distance (Feet) 1000 Feet   Assistive device None   Gait Pattern Within Functional Limits;Step-through pattern  occasional foot slap towards end of gait   Ambulation Surface Level;Unlevel;Indoor;Outdoor;Paved;Gravel;Grass   Gait velocity 8.69 sec's: 3.78 ft/sec   Curb 6: Modified independent (Device/increase time)   Curb Details (indicate cue type and reason) x 1 with outdoor curb, no AD           PT Education - 01/23/17 1221    Education provided Yes   Education Details progress toward goals to date, anticipation of discharge at next visit   Person(s) Educated Patient   Methods Explanation;Demonstration   Comprehension Returned demonstration;Verbalized understanding          PT Short Term Goals - 12/19/16 1238      PT SHORT TERM GOAL #1   Title (TARGET DATE FOR STG 12/19/16)  Pt will participate in FGA with LTG to be set   Status Achieved     PT SHORT TERM GOAL #2   Title Pt will improve functional LE strength as indicated by decrease in 5x sit to stand score of <10 seconds   Baseline 12/19/16: 11.10 sec's today, improved from 12.32 sec's at eval   Status Partially Met     PT SHORT TERM GOAL #3   Title Pt will improve safety with gait as indicated by improvement in gait velocity to >3.5 ft/sec   Baseline 12/19/16: 3.24 ft/sec today, improved from 3.07 ft/sec at eval   Status Partially Met     PT SHORT TERM GOAL #4   Title Pt will negotiate 8  stairs, alternating sequence with no rail and supervision    Baseline 12/19/16: met today   Status Achieved     PT SHORT TERM GOAL #5   Title Pt will be able to maintain 30 seconds on all conditions on MCTSIB to indicate improved use of all sensory systems   Baseline 12/19/16: met today   Status Achieved           PT Long Term Goals - 01/23/17 1105  PT LONG TERM GOAL #1   Title (REVISED TARGET DATE FOR ALL LTG 01/26/17) Pt will demonstrate independence with LE strengthening and balance HEP   Status Revised     PT LONG TERM GOAL #2   Title Pt will improve FGA score to >23/30 to decrease falls risk    Baseline 01/23/17: 27/30 scored today   Status Achieved     PT LONG TERM GOAL #3   Title Pt will improve safety with community gait as indicated by gait velocity of >25f/sec   Baseline 01/23/17:    Status On-going     PT LONG TERM GOAL #4   Title Pt will ambulate >1000 over outdoor/uneven terrain and up/down curb at Mod I level and no evidence of LOB and improved foot clearance   Baseline 01/23/17: met today   Status Achieved     PT LONG TERM GOAL #5   Title Pt will improve ABC score to >80% confidence   Baseline 64.4%   Status On-going            Plan - 01/23/17 1105    Clinical Impression Statement Today's skilled session began addressing LTGs with pt meeting FGA goal today. Did not meet gait speed goal, however did progress from last time checked. Will check remaining goals at next session with anticipation of discharge.    Rehab Potential Good   Clinical Impairments Affecting Rehab Potential lymphoma, chemo   PT Frequency 2x / week   PT Duration 8 weeks   PT Treatment/Interventions ADLs/Self Care Home Management;DME Instruction;Gait training;Stair training;Functional mobility training;Therapeutic activities;Therapeutic exercise;Balance training;Neuromuscular re-education;Patient/family education;Orthotic Fit/Training;Energy conservation   PT Next Visit Plan check  remaining LTGs, review/advance HEP as needed, community fitness for continued strengthening, review floor transfers for simulation of working on lLicensed conveyancerwith Plan of Care Patient      Patient will benefit from skilled therapeutic intervention in order to improve the following deficits and impairments:  Abnormal gait, Decreased activity tolerance, Decreased balance, Decreased endurance, Decreased strength, Difficulty walking, Impaired sensation  Visit Diagnosis: Difficulty in walking, not elsewhere classified  Unsteadiness on feet  Repeated falls  Foot drop, left  Foot drop, right     Problem List Patient Active Problem List   Diagnosis Date Noted  . Port catheter in place 11/06/2016  . Peripheral neuropathy 09/11/2016  . Atypical pneumonia 06/18/2016  . Acute respiratory failure with hypoxia ([facility] 06/18/2016  . Fever 06/16/2016  . Lactic acidosis 06/16/2016  . Dehydration 06/16/2016  . Encounter for antineoplastic chemotherapy 04/17/2016  . NHL (non-Hodgkin's lymphoma) ([facility] 04/05/2016  . Diffuse large B-cell lymphoma of lymph nodes of multiple regions ([facility] 04/05/2016  . Lung mass 03/13/2016  . Post-operative state 03/08/2014  . Left inguinal hernia 05/08/2013    KWillow Ora PTA, CM[facility] HospitalOut[facility] RPheLPs [facility] SWest BranchGDanbury [place] [phone]/30/18, 12:22 PM   Name: Seth VACHAMRN: [id number] of Birth: 9February 04, 1937

## 2017-01-23 NOTE — Therapy (Signed)
[facility] [address] [place], [place], [address] Phone: [phone]   Fax:  [phone]  Occupational Therapy Treatment  Patient Details  Name: Seth Gilbert MRN: [id number] Date of Birth: 11-23-1935 Referring Provider: Dr. Jaynee Eagles  Encounter Date: 01/23/2017      OT End of Session - 01/23/17 1402    Visit Number 13   Number of Visits 17   Date for OT Re-Evaluation 01/25/17   Authorization Type Blue M[employer]   Authorization - Visit Number 13   Authorization - Number of Visits 20   OT Start Time 1020   OT Stop Time 1100   OT Time Calculation (min) 40 min   Activity Tolerance Patient tolerated treatment well      Past Medical History:  Diagnosis Date  . Cancer ([facility])    NHL  . Diverticulitis   . GERD (gastroesophageal reflux disease)    Heartburn at times  . Heart murmur    "comes and goes" onset 4th grade - last time anyone heard it was 01/2014  . History of hiatal hernia   . History of kidney stones   . HOH (hard of hearing)   . Non-Hodgkin lymphoma ([place])   . Peripheral neuropathy 09/11/2016  . PONV (postoperative nausea and vomiting)   . Rosacea   . Wears glasses     Past Surgical History:  Procedure Laterality Date  . APPENDECTOMY     2-3 yrs old  . COLONOSCOPY    . HEMORROIDECTOMY     inflammed anal gland removed  . INGUINAL HERNIA REPAIR Left 02/11/2014   Procedure: LEFT INGUINAL HERNIA REPAIR WITH MESH ;  Surgeon: Joyice Faster. Cornett, MD;  Location: [facility];  Service: General;  Laterality: Left;  . INSERTION OF MESH N/A 02/11/2014   Procedure: INSERTION OF MESH;  Surgeon: Joyice Faster. Cornett, MD;  Location: [facility];  Service: General;  Laterality: N/A;  . PORTACATH PLACEMENT Left 04/10/2016   Procedure: INSERTION PORT-A-CATH into LEFT Subclavian Vein with guidance from ultrasound and flouroscopy;  Surgeon: Grace Isaac, MD;  Location: [facility];  Service: Thoracic;   Laterality: Left;  . TONSILLECTOMY    . VIDEO BRONCHOSCOPY WITH ENDOBRONCHIAL ULTRASOUND N/A 03/30/2016   Procedure: VIDEO BRONCHOSCOPY WITH ENDOBRONCHIAL ULTRASOUND,WITH TRANSBRONCHIAL BIOPSY;  Surgeon: Grace Isaac, MD;  Location: [facility];  Service: Thoracic;  Laterality: N/A;    There were no vitals filed for this visit.      Subjective Assessment - 01/23/17 1023    Subjective  My core is weak. I feel like I need to work on that. I also want to be able to get up from the floor easier   Pertinent History  chemo induced peripheral neuropathy with decreased coordination and multiple falls. Pt's PMH: lymphoma and chemotherapy, hypothyroidism.   Patient Stated Goals improve coordination, strength   Currently in Pain? No/denies            [facility] OT Assessment - 01/23/17 1401      Coordination   Right 9 Hole Peg Test 22.75 sec   Left 9 Hole Peg Test 26.21 sec     Hand Function   Right Hand Grip (lbs) 77 lbs   Left Hand Grip (lbs) 59 lbs                  OT Treatments/Exercises (OP) - 01/23/17 0001      ADLs   ADL Comments Began assessing LTG's and progress  to date. See assessment and goal sections     Neurological Re-education Exercises   Other Exercises 1 Worked on core activation/control while on physioball with A-P pelvic tilts, activation of bilateral sides with marching and UE's lifts. Pt with decr. ability to disassociate trunk from pelvis and required CGA to min assist for more challenging activities on ball to prevent falling                  OT Short Term Goals - 12/31/16 1505      OT SHORT TERM GOAL #1   Title I with HEP- due 12/26/16   Time 4   Period Weeks   Status Achieved     OT SHORT TERM GOAL #2   Title Pt will verbalize understanding of adapted strategies for ADLs/IADLS and sensory precautions.   Status Achieved     OT SHORT TERM GOAL #3   Title Pt will demonstrate improved ADL performance as evidenced by  decreasing 3 button/  unbutton time to 30 secs or less.   Baseline 35.50 secs   Time 4   Period Weeks   Status Achieved  22.53 secs     OT SHORT TERM GOAL #4   Title Pt will increase bilateral standing functional reach to greater than or equal to 10 inches in order to decrease fall risk and increase confidence for ADLs.   Time 4   Period Weeks   Status Achieved  rigt 10.5, left 10.5            OT Long Term Goals - 01/23/17 1402      OT LONG TERM GOAL #1   Title Pt will perform mod complex home management activities at a modified independent level .-01/25/17   Time 8   Period Weeks   Status On-going     OT LONG TERM GOAL #2   Title Pt will demonstrate improved fine motor coordination for ADLs/IADLS as  evidenced by decreasing bilateral 9 hole peg test score by 3 secs.   Baseline eval: Rt = 35.97 sec. Lt = 33.19 sec   Time 8   Period Weeks   Status Achieved  Rt = 22.75 sec. Lt = 26.21 sec     OT LONG TERM GOAL #3   Title Pt will increase LUE grip strength to 45 lbs or greater for increased ease with IADLs.- upgraded Pt will increase LUE grip strength to 55 lbs.   Time 8   Period Weeks   Status Achieved   goal met 50 lbs. 01/23/17: 59 lbs     OT LONG TERM GOAL #4   Title Pt will demonstrate improved bilateral strength and functional use as evidenced by retrieving a 3 lbs weight at 110 shoulder flexion for right UE  and left UE   Time 8   Period Weeks   Status Achieved  01/23/17: Pt able to perform bilaterally b/t 115-120* with 3 lbs               Plan - 01/23/17 1404    Clinical Impression Statement Pt has improved significantly in coordination, as well as grip strength and BUE strength. Pt has met LTG's #2-4   Rehab Potential Good   OT Frequency 2x / week   OT Duration 8 weeks   OT Treatment/Interventions Balance training;Self-care/ADL training;DME and/or AE instruction;Patient/family education;Therapeutic exercises;Therapeutic exercise;Therapeutic activities;Passive range of  motion;Neuromuscular education;Functional Mobility Training;Manual Therapy;Electrical Stimulation;Parrafin;Energy conservation;Cryotherapy;Moist Heat;Splinting   Plan check remaining goal and ? renewal vs. d/c. Continue core  control through tall kneeling, quadraped activities (modify for thumb pain). If d/c - will need G code. If renewal - pt wants to work towards getting up off floor in prep for yardwork/functional tasks   Consulted and Agree with Plan of Care Patient      Patient will benefit from skilled therapeutic intervention in order to improve the following deficits and impairments:  Abnormal gait, Pain, Impaired sensation, Decreased coordination, Decreased mobility, Decreased endurance, Decreased range of motion, Decreased strength, Impaired tone, Impaired UE functional use, Difficulty walking, Decreased safety awareness, Decreased knowledge of precautions, Decreased balance  Visit Diagnosis: Muscle weakness (generalized)    Problem List Patient Active Problem List   Diagnosis Date Noted  . Port catheter in place 11/06/2016  . Peripheral neuropathy 09/11/2016  . Atypical pneumonia 06/18/2016  . Acute respiratory failure with hypoxia ([place]) 06/18/2016  . Fever 06/16/2016  . Lactic acidosis 06/16/2016  . Dehydration 06/16/2016  . Encounter for antineoplastic chemotherapy 04/17/2016  . NHL (non-Hodgkin's lymphoma) ([place]) 04/05/2016  . Diffuse large B-cell lymphoma of lymph nodes of multiple regions ([facility]) 04/05/2016  . Lung mass 03/13/2016  . Post-operative state 03/08/2014  . Left inguinal hernia 05/08/2013    Carey Bullocks, OTR/L 01/23/2017, 2:08 PM  [facility] [address] [place], [place], [address] Phone: [phone]   Fax:  [phone]  Name: Seth Gilbert MRN: [id number] Date of Birth: 12/07/1935

## 2017-01-24 ENCOUNTER — Ambulatory Visit: Payer: Medicare Other | Admitting: Occupational Therapy

## 2017-01-24 ENCOUNTER — Encounter: Payer: Self-pay | Admitting: Physical Therapy

## 2017-01-24 ENCOUNTER — Ambulatory Visit: Payer: Medicare Other | Admitting: Physical Therapy

## 2017-01-24 DIAGNOSIS — M6281 Muscle weakness (generalized): Secondary | ICD-10-CM | POA: Diagnosis not present

## 2017-01-24 DIAGNOSIS — R278 Other lack of coordination: Secondary | ICD-10-CM

## 2017-01-24 DIAGNOSIS — M21372 Foot drop, left foot: Secondary | ICD-10-CM

## 2017-01-24 DIAGNOSIS — M21371 Foot drop, right foot: Secondary | ICD-10-CM

## 2017-01-24 DIAGNOSIS — R262 Difficulty in walking, not elsewhere classified: Secondary | ICD-10-CM

## 2017-01-24 DIAGNOSIS — R2681 Unsteadiness on feet: Secondary | ICD-10-CM

## 2017-01-24 DIAGNOSIS — R208 Other disturbances of skin sensation: Secondary | ICD-10-CM

## 2017-01-24 DIAGNOSIS — R296 Repeated falls: Secondary | ICD-10-CM

## 2017-01-24 NOTE — Therapy (Signed)
[facility] [address] [place], [place], [address] Phone: [phone]   Fax:  [phone]  Physical Therapy Treatment  Patient Details  Name: Seth Gilbert MRN: [id number] Date of Birth: 1935-09-27 Referring Provider: Melvenia Beam, MD  Encounter Date: 01/24/2017      PT End of Session - 01/24/17 1407    Visit Number 15   Number of Visits 17   Date for PT Re-Evaluation 01/26/17   Authorization Type [employer] [place] code and progress note 10th visit   PT Start Time 1402   PT Stop Time 1445   PT Time Calculation (min) 43 min   Equipment Utilized During Treatment Gait belt   Activity Tolerance Patient tolerated treatment well   Behavior During Therapy [facility] for tasks assessed/performed      Past Medical History:  Diagnosis Date  . Cancer ([place])    NHL  . Diverticulitis   . GERD (gastroesophageal reflux disease)    Heartburn at times  . Heart murmur    "comes and goes" onset 4th grade - last time anyone heard it was 01/2014  . History of hiatal hernia   . History of kidney stones   . HOH (hard of hearing)   . Non-Hodgkin lymphoma ([place])   . Peripheral neuropathy 09/11/2016  . PONV (postoperative nausea and vomiting)   . Rosacea   . Wears glasses     Past Surgical History:  Procedure Laterality Date  . APPENDECTOMY     2-3 yrs old  . COLONOSCOPY    . HEMORROIDECTOMY     inflammed anal gland removed  . INGUINAL HERNIA REPAIR Left 02/11/2014   Procedure: LEFT INGUINAL HERNIA REPAIR WITH MESH ;  Surgeon: Joyice Faster. Cornett, MD;  Location: [place];  Service: General;  Laterality: Left;  . INSERTION OF MESH N/A 02/11/2014   Procedure: INSERTION OF MESH;  Surgeon: Joyice Faster. Cornett, MD;  Location: [place];  Service: General;  Laterality: N/A;  . PORTACATH PLACEMENT Left 04/10/2016   Procedure: INSERTION PORT-A-CATH into LEFT Subclavian Vein with guidance from  ultrasound and flouroscopy;  Surgeon: Grace Isaac, MD;  Location: [place];  Service: Thoracic;  Laterality: Left;  . TONSILLECTOMY    . VIDEO BRONCHOSCOPY WITH ENDOBRONCHIAL ULTRASOUND N/A 03/30/2016   Procedure: VIDEO BRONCHOSCOPY WITH ENDOBRONCHIAL ULTRASOUND,WITH TRANSBRONCHIAL BIOPSY;  Surgeon: Grace Isaac, MD;  Location: [facility];  Service: Thoracic;  Laterality: N/A;    There were no vitals filed for this visit.      Subjective Assessment - 01/24/17 1406    Subjective No new complaints. No falls or pain to report. Has problem solved with OT on getting down to adjust blades on lawn mower using a stool. Still issues with getting up from floor.    Patient is accompained by: Family member   Pertinent History lymphoma with chemo, HOH   Limitations Walking;Other (comment)   Patient Stated Goals Get back to normal function.     Currently in Pain? No/denies   Pain Score 0-No pain     Issued to HEP today: HIP: Extension / KNEE: Flexion, Tall Kneeling    Kneel with feet hanging off edge of bed/sofa/chair: Lower your hips down to feet a low as you can without buttocks touching feet. Then bring hips back up/forward. 10 reps per set, 1_ sets per day, _5_ days per week  Copyright  VHI. All rights reserved.     Deep Squat  The deep squat (when done correctly) utilizes your entire posterior chain- including glutes, hamstrings, and low back extensors while also working your quads and core stability.  Start position: use a stool, seated with feet shoulder width apart, core should be braced  Movement: bend at the waist while still maintaining an erect torso and braced core, squeeze your glutes and begin to drive up through your heels and midfoot  End position: Standing with braced core, lower yourself to start position, stick your bottom out maintaining braced core.  Do not allow your knees to extend over your toes Do not push off of your toes Correct form on every rep! Hold  onto sofa/chair for balance assistance as needed.    In the position above ( or on forearms on sofa/chair) perform the following: Lift knee of back leg up so your on your toes only and then slowly lower knee back down. 10 times on each leg.  1-2 times a day.         PT Education - 01/24/17 2051    Education provided Yes   Education Details updated HEP for LE strengthening to address strength needed to get off floor without UE support   Person(s) Educated Patient   Methods Explanation;Demonstration;Verbal cues;Handout   Comprehension Verbalized understanding;Returned demonstration          PT Short Term Goals - 12/19/16 1238      PT SHORT TERM GOAL #1   Title (TARGET DATE FOR STG 12/19/16)  Pt will participate in FGA with LTG to be set   Status Achieved     PT SHORT TERM GOAL #2   Title Pt will improve functional LE strength as indicated by decrease in 5x sit to stand score of <10 seconds   Baseline 12/19/16: 11.10 sec's today, improved from 12.32 sec's at eval   Status Partially Met     PT SHORT TERM GOAL #3   Title Pt will improve safety with gait as indicated by improvement in gait velocity to >3.5 ft/sec   Baseline 12/19/16: 3.24 ft/sec today, improved from 3.07 ft/sec at eval   Status Partially Met     PT SHORT TERM GOAL #4   Title Pt will negotiate 8 stairs, alternating sequence with no rail and supervision    Baseline 12/19/16: met today   Status Achieved     PT SHORT TERM GOAL #5   Title Pt will be able to maintain 30 seconds on all conditions on MCTSIB to indicate improved use of all sensory systems   Baseline 12/19/16: met today   Status Achieved           PT Long Term Goals - 01/24/17 2045      PT LONG TERM GOAL #1   Title (REVISED TARGET DATE FOR ALL LTG 01/26/17) Pt will demonstrate independence with LE strengthening and balance HEP   Baseline 01/24/17: met with advancements made today   Status Achieved     PT LONG TERM GOAL #2   Title Pt will  improve FGA score to >23/30 to decrease falls risk    Baseline 01/23/17: 27/30 scored today   Status Achieved     PT LONG TERM GOAL #3   Title Pt will improve safety with community gait as indicated by gait velocity of >42f/sec   Baseline 01/23/17: improved to 3.78 ft/sec no AD, just not to goal   Status Partially Met     PT LONG TERM GOAL #4   Title Pt will ambulate >1000  over outdoor/uneven terrain and up/down curb at Mod I level and no evidence of LOB and improved foot clearance   Baseline 01/23/17: met today   Status Achieved     PT LONG TERM GOAL #5   Title Pt will improve ABC score to >80% confidence   Baseline 01/23/17: 91.3%   Status Achieved               Plan - Feb 04, 2017 1408    Clinical Impression Statement Pt has met 4/5 LTGs and partially met the remaining goals. Majority of this session was focused on advancement of HEP for strengthening to improve pt's ability to get up from floor. Pt also plans to join [place] program through [facility] for continued strengthening post discharge.                               Rehab Potential Good   Clinical Impairments Affecting Rehab Potential lymphoma, chemo   PT Frequency 2x / week   PT Duration 8 weeks   PT Treatment/Interventions ADLs/Self Care Home Management;DME Instruction;Gait training;Stair training;Functional mobility training;Therapeutic activities;Therapeutic exercise;Balance training;Neuromuscular re-education;Patient/family education;Orthotic Fit/Training;Energy conservation   PT Next Visit Plan discharge per PT plan of care   Consulted and Agree with Plan of Care Patient      Patient will benefit from skilled therapeutic intervention in order to improve the following deficits and impairments:  Abnormal gait, Decreased activity tolerance, Decreased balance, Decreased endurance, Decreased strength, Difficulty walking, Impaired sensation  Visit Diagnosis: Difficulty in walking, not elsewhere classified  Unsteadiness  on feet  Repeated falls  Foot drop, left  Foot drop, right  Muscle weakness (generalized)       G-Codes - 2017-02-04 2050    Functional Assessment Tool Used (Outpatient Only) 5X sit to stand: 11.10 sec's; FGA 27/30; gait velocity 3.78 ft/sec no AD   Functional Limitation Mobility: Walking and moving around   Mobility: Walking and Moving Around Goal Status [phone]) At least 1 percent but less than 20 percent impaired, limited or restricted   Mobility: Walking and Moving Around Discharge Status [phone]) At least 1 percent but less than 20 percent impaired, limited or restricted     PHYSICAL THERAPY DISCHARGE SUMMARY  Visits from Start of Care: 15  Current functional level related to goals / functional outcomes: Pt met 4/5 LTG and has demonstrated improvements in LE strength, safety and dynamic balance during gait in the community, improved gait velocity and has decreased falls risk.  Pt also very motivated to continue progress through community wellness.   Remaining deficits: Decreased sensation, LE strength and balance   Education / Equipment: HEP  Plan: Patient agrees to discharge.  Patient goals were met. Patient is being discharged due to meeting the stated rehab goals.  ?????     G code and D/C Summary entered by: Raylene Everts, PT, DPT 01/25/17    10:29 AM     Problem List Patient Active Problem List   Diagnosis Date Noted  . Port catheter in place 11/06/2016  . Peripheral neuropathy 09/11/2016  . Atypical pneumonia 06/18/2016  . Acute respiratory failure with hypoxia ([place]) 06/18/2016  . Fever 06/16/2016  . Lactic acidosis 06/16/2016  . Dehydration 06/16/2016  . Encounter for antineoplastic chemotherapy 04/17/2016  . NHL (non-Hodgkin's lymphoma) ([place]) 04/05/2016  . Diffuse large B-cell lymphoma of lymph nodes of multiple regions ([place]) 04/05/2016  . Lung mass 03/13/2016  . Post-operative state 03/08/2014  . Left inguinal  hernia 05/08/2013    Willow Ora, PTA,  [facility] [address], [place], [place] [address] [phone] 01/24/17, 8:52 PM   Name: Seth Gilbert MRN: [id number] Date of Birth: [birth year]

## 2017-01-24 NOTE — Patient Instructions (Addendum)
HIP: Extension / KNEE: Flexion, Tall Kneeling    Kneel with feet hanging off edge of bed/sofa/chair: Lower your hips down to feet a low as you can without buttocks touching feet. Then bring hips back up/forward. 10 reps per set, 1_ sets per day, _5_ days per week  Copyright  VHI. All rights reserved.     Deep Squat  The deep squat (when done correctly) utilizes your entire posterior chain- including glutes, hamstrings, and low back extensors while also working your quads and core stability.  Start position: use a stool, seated with feet shoulder width apart, core should be braced  Movement: bend at the waist while still maintaining an erect torso and braced core, squeeze your glutes and begin to drive up through your heels and midfoot  End position: Standing with braced core, lower yourself to start position, stick your bottom out maintaining braced core.  Do not allow your knees to extend over your toes Do not push off of your toes Correct form on every rep! Hold onto sofa/chair for balance assistance as needed.    In the position above ( or on forearms on sofa/chair) perform the following: Lift knee of back leg up so your on your toes only and then slowly lower knee back down. 10 times on each leg.  1-2 times a day.

## 2017-01-24 NOTE — Therapy (Signed)
[facility] [address] [place], [place], [address] Phone: [phone]   Fax:  [phone]  Occupational Therapy Treatment  Patient Details  Name: Seth Gilbert MRN: [id number] Date of Birth: 07/24/1936 Referring Provider: Dr. Jaynee Eagles  Encounter Date: 01/24/2017      OT End of Session - 01/24/17 1640    Visit Number 14   Number of Visits 17   Date for OT Re-Evaluation 01/25/17   Authorization Type Blue M[employer]   Authorization - Visit Number 14   Authorization - Number of Visits 20   OT Start Time 1318   OT Stop Time 1400   OT Time Calculation (min) 42 min   Activity Tolerance Patient tolerated treatment well   Behavior During Therapy [facility] for tasks assessed/performed      Past Medical History:  Diagnosis Date  . Cancer ([place])    NHL  . Diverticulitis   . GERD (gastroesophageal reflux disease)    Heartburn at times  . Heart murmur    "comes and goes" onset 4th grade - last time anyone heard it was 01/2014  . History of hiatal hernia   . History of kidney stones   . HOH (hard of hearing)   . Non-Hodgkin lymphoma ([place])   . Peripheral neuropathy 09/11/2016  . PONV (postoperative nausea and vomiting)   . Rosacea   . Wears glasses     Past Surgical History:  Procedure Laterality Date  . APPENDECTOMY     2-3 yrs old  . COLONOSCOPY    . HEMORROIDECTOMY     inflammed anal gland removed  . INGUINAL HERNIA REPAIR Left 02/11/2014   Procedure: LEFT INGUINAL HERNIA REPAIR WITH MESH ;  Surgeon: Joyice Faster. Cornett, MD;  Location: [place];  Service: General;  Laterality: Left;  . INSERTION OF MESH N/A 02/11/2014   Procedure: INSERTION OF MESH;  Surgeon: Joyice Faster. Cornett, MD;  Location: [place];  Service: General;  Laterality: N/A;  . PORTACATH PLACEMENT Left 04/10/2016   Procedure: INSERTION PORT-A-CATH into LEFT Subclavian Vein with guidance from ultrasound and flouroscopy;  Surgeon:  Grace Isaac, MD;  Location: [place];  Service: Thoracic;  Laterality: Left;  . TONSILLECTOMY    . VIDEO BRONCHOSCOPY WITH ENDOBRONCHIAL ULTRASOUND N/A 03/30/2016   Procedure: VIDEO BRONCHOSCOPY WITH ENDOBRONCHIAL ULTRASOUND,WITH TRANSBRONCHIAL BIOPSY;  Surgeon: Grace Isaac, MD;  Location: [place];  Service: Thoracic;  Laterality: N/A;    There were no vitals filed for this visit.      Subjective Assessment - 01/24/17 1639    Subjective  Pt agrees with plans for d/c.   Pertinent History  chemo induced peripheral neuropathy with decreased coordination and multiple falls. Pt's PMH: lymphoma and chemotherapy, hypothyroidism.   Patient Stated Goals improve coordination, strength   Currently in Pain? No/denies            [facility] OT Assessment - 01/23/17 1401      Coordination   Right 9 Hole Peg Test 22.75 sec   Left 9 Hole Peg Test 26.21 sec     Hand Function   Right Hand Grip (lbs) 77 lbs   Left Hand Grip (lbs) 59 lbs                          OT Education - 01/24/17 1642    Education provided Yes   Education Details updated HEP for theraband blue, green putty and modified  quadraped rock and twist, recommendation that pt uses a seat(rather than kneeling) due to difficulty getting up/down from ground, for washing tires and fixing lawnmower, recoomendation that pt does not use ladders at this time due to fall risk   Person(s) Educated Patient   Methods Explanation;Demonstration;Verbal cues   Comprehension Verbalized understanding;Returned demonstration;Verbal cues required          OT Short Term Goals - 12/31/16 1505      OT SHORT TERM GOAL #1   Title I with HEP- due 12/26/16   Time 4   Period Weeks   Status Achieved     OT SHORT TERM GOAL #2   Title Pt will verbalize understanding of adapted strategies for ADLs/IADLS and sensory precautions.   Status Achieved     OT SHORT TERM GOAL #3   Title Pt will demonstrate improved ADL performance as  evidenced by  decreasing 3 button/ unbutton time to 30 secs or less.   Baseline 35.50 secs   Time 4   Period Weeks   Status Achieved  22.53 secs     OT SHORT TERM GOAL #4   Title Pt will increase bilateral standing functional reach to greater than or equal to 10 inches in order to decrease fall risk and increase confidence for ADLs.   Time 4   Period Weeks   Status Achieved  rigt 10.5, left 10.5            OT Long Term Goals - 01/24/17 1641      OT LONG TERM GOAL #1   Title Pt will perform mod complex home management activities at a modified independent level .-01/25/17   Time 8   Period Weeks   Status Partially Met  met for vacuuming, pt has not fully resumed yard work     OT LONG TERM GOAL #2   Title Pt will demonstrate improved fine motor coordination for ADLs/IADLS as  evidenced by decreasing bilateral 9 hole peg test score by 3 secs.   Baseline eval: Rt = 35.97 sec. Lt = 33.19 sec   Time 8   Period Weeks   Status Achieved  Rt = 22.75 sec. Lt = 26.21 sec     OT LONG TERM GOAL #3   Title Pt will increase LUE grip strength to 45 lbs or greater for increased ease with IADLs.- upgraded Pt will increase LUE grip strength to 55 lbs.   Time 8   Period Weeks   Status Achieved   goal met 50 lbs. 01/23/17: 59 lbs     OT LONG TERM GOAL #4   Title Pt will demonstrate improved bilateral strength and functional use as evidenced by retrieving a 3 lbs weight at 110 shoulder flexion for right UE  and left UE   Time 8   Period Weeks   Status Achieved  01/23/17: Pt able to perform bilaterally b/t 115-120* with 3 #'s               Plan - 01/24/17 1644    Clinical Impression Statement Discharge from occupational therapy, pt has and updated HEP.   Rehab Potential Good   OT Frequency 2x / week   OT Duration 8 weeks   OT Treatment/Interventions Balance training;Self-care/ADL training;DME and/or AE instruction;Patient/family education;Therapeutic exercises;Therapeutic  exercise;Therapeutic activities;Passive range of motion;Neuromuscular education;Functional Mobility Training;Manual Therapy;Electrical Stimulation;Parrafin;Energy conservation;Cryotherapy;Moist Heat;Splinting   Plan d/c OT   Consulted and Agree with Plan of Care Patient      Patient will benefit from skilled  therapeutic intervention in order to improve the following deficits and impairments:  Abnormal gait, Pain, Impaired sensation, Decreased coordination, Decreased mobility, Decreased endurance, Decreased range of motion, Decreased strength, Impaired tone, Impaired UE functional use, Difficulty walking, Decreased safety awareness, Decreased knowledge of precautions, Decreased balance  Visit Diagnosis: Muscle weakness (generalized)  Other lack of coordination  Other disturbances of skin sensation      G-Codes - February 15, 2017 1646    Functional Assessment Tool Used (Outpatient only) Grip strength: RUE 77 lbs, LUE 59  lbs, 9 hole peg test RUE 22.75 LUE 26.21,clinical impressions   Functional Limitation Carrying, moving and handling objects   Carrying, Moving and Handling Objects Goal Status ([id number]) At least 1 percent but less than 20 percent impaired, limited or restricted   Carrying, Moving and Handling Objects Discharge Status [phone]) At least 1 percent but less than 20 percent impaired, limited or restricted    OCCUPATIONAL THERAPY DISCHARGE SUMMARY   Current functional level related to goals / functional outcomes: See above, pt made excellent progress towards goals. Pt has not fully resumed prior level of yard work. therapsi discussed strategies and precautions with patient.   Remaining deficits: Decreased strength, decreased balance   Education / Equipment: Pt was educated regarding HEP and strategies for IADLS including sitting to wash car tires. Pt verbalized understanding.  Plan: Patient agrees to discharge.  Patient goals were partially met. Patient is being discharged due to  meeting the stated rehab goals.  ?????      Problem List Patient Active Problem List   Diagnosis Date Noted  . Port catheter in place 11/06/2016  . Peripheral neuropathy 09/11/2016  . Atypical pneumonia 06/18/2016  . Acute respiratory failure with hypoxia ([place]) 06/18/2016  . Fever 06/16/2016  . Lactic acidosis 06/16/2016  . Dehydration 06/16/2016  . Encounter for antineoplastic chemotherapy 04/17/2016  . NHL (non-Hodgkin's lymphoma) ([place]) 04/05/2016  . Diffuse large B-cell lymphoma of lymph nodes of multiple regions ([place]) 04/05/2016  . Lung mass 03/13/2016  . Post-operative state 03/08/2014  . Left inguinal hernia 05/08/2013    RINE,KATHRYN 2017/02/15, 4:49 PM Theone Murdoch, OTR/L [phone] Phone: [phone] 4:52 PM 2017-02-15 [facility] [address] [place], [place], [address] Phone: [phone]   Fax:  [phone]  Name: Seth Gilbert MRN: [id number] Date of Birth: May 31, 1936

## 2017-01-25 ENCOUNTER — Ambulatory Visit: Payer: Medicare Other | Admitting: Physical Therapy

## 2017-01-30 ENCOUNTER — Ambulatory Visit: Payer: Medicare Other | Admitting: Physical Therapy

## 2017-02-01 ENCOUNTER — Encounter: Payer: Medicare Other | Admitting: Occupational Therapy

## 2017-02-01 ENCOUNTER — Ambulatory Visit: Payer: Medicare Other | Admitting: Physical Therapy

## 2017-02-04 ENCOUNTER — Ambulatory Visit: Payer: Medicare Other | Admitting: Physical Therapy

## 2017-02-04 ENCOUNTER — Encounter: Payer: Medicare Other | Admitting: Occupational Therapy

## 2017-02-06 ENCOUNTER — Ambulatory Visit: Payer: Medicare Other | Admitting: Physical Therapy

## 2017-02-06 ENCOUNTER — Encounter: Payer: Medicare Other | Admitting: Occupational Therapy

## 2017-02-11 ENCOUNTER — Encounter: Payer: Medicare Other | Admitting: Occupational Therapy

## 2017-02-11 ENCOUNTER — Ambulatory Visit: Payer: Medicare Other | Admitting: Physical Therapy

## 2017-02-12 ENCOUNTER — Encounter: Payer: Self-pay | Admitting: Neurology

## 2017-02-12 ENCOUNTER — Ambulatory Visit (INDEPENDENT_AMBULATORY_CARE_PROVIDER_SITE_OTHER): Payer: Medicare Other | Admitting: Neurology

## 2017-02-12 VITALS — BP 117/73 | HR 85 | Ht 68.0 in | Wt 189.7 lb

## 2017-02-12 DIAGNOSIS — Z9181 History of falling: Secondary | ICD-10-CM | POA: Diagnosis not present

## 2017-02-12 DIAGNOSIS — T451X5A Adverse effect of antineoplastic and immunosuppressive drugs, initial encounter: Principal | ICD-10-CM

## 2017-02-12 DIAGNOSIS — R29898 Other symptoms and signs involving the musculoskeletal system: Secondary | ICD-10-CM | POA: Diagnosis not present

## 2017-02-12 DIAGNOSIS — G620 Drug-induced polyneuropathy: Secondary | ICD-10-CM

## 2017-02-12 DIAGNOSIS — R2689 Other abnormalities of gait and mobility: Secondary | ICD-10-CM

## 2017-02-12 NOTE — Progress Notes (Signed)
[id number]    Provider:  Dr Jaynee Eagles Referring Provider: Lawerance Cruel, MD Primary Care Physician:  Lawerance Cruel, MD  CC:  Neuropathy  Interval history 02/12/2017:  He is dramatically better. He is walking several miles a day. No recent falls. He has minimal numbness in his thumbs. He has some decreased sensitivity but improved in his legs. Fine nmotor is better, he can even button his jeans. Still with balance problems. 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 Discussed yoga, Tai Chi, silver sneakers and other therapies he can continue.   HPI:  Seth Gilbert is a 81 y.o. male here as a referral from Dr. Harrington Challenger and Dr. Julien Nordmann for neuropathy. Diagnosed with stage II large B cell non-Hodgkin lymphoma diagnosed in August 2017 s/p chemotherapy with CHOP/Rituxan every 3 weeks with Neulasta support status post 6 cycles (cyclophosphamide, doxorubicin, vincristine, and prednisone). Symptoms started with chemotherapy. Happened in the last 2 cycles. Got a little worse after the end of chemo and now he is improving. His symptoms wax and wane. He denies numbness or paresthesias but he describes difficulty unzippering his jacket, decreased fine motor movements especially in his hands, decreased grip. In his feet, the joints at his toes felt stiff in January. It affected his walking for several months. He is trying to do "toe lifts", he has balance problems, he has had 2 falls tripping over things and losing balance. No pain. He has weakness as well. He lost weight and muscle tone as well during chemo. Difficulty getting out of low seats.  Reviewed notes, labs and imaging from outside physicians, which showed:  Patient has systemic diagnosed with stage II large B cell non-Hodgkin lymphoma diagnosed in August 2017. chemotherapy with CHOP/Rituxan every 3 weeks with Neulasta support status post 6 cycles.chemotherapy with CHOP/Rituxan every 3 weeks with Neulasta support status post 6  cycles (cyclophosphamide, doxorubicin, vincristine, and prednisone). He has complained of generalized weakness in the upper and lower extremities. He also has evidence of peripheral neuropathy and no significant numbness more so weakness. He denied any chest pain shortness of breath cough or hemoptysis. No fever or chills. No nausea or other associated symptoms. His PET scan showed almost complete response with no findings suspicious for acute lymphoma. Patient also reported having difficulty with balance, dexterity and left foot leg, difficulty walking, stiffness in the toes, weakness, difficulty with fine motor of the hands, cannot zip up his coat or button his shirts. Also having trouble with eating and holding the silverware.  Personally reviewed MRI of the brain images and agree with the following:  IMPRESSION: 1. Negative MRI for intracranial metastatic disease or other mass lesion. No other acute intracranial process. 2. Age appropriate cerebral atrophy with mild chronic small vessel ischemic disease.    Review of Systems: Patient complains of symptoms per HPI as well as the following symptoms: no CP, no SOB, no abdominal pain, no rash no fever. Pertinent negatives per HPI. All others negative.   Social History   Social History  . Marital status: Married    Spouse name: N/A  . Number of children: 2  . Years of education: BS   Occupational History  . Retired    Social History Main Topics  . Smoking status: Never Smoker  . Smokeless tobacco: Never Used  . Alcohol use No     Comment: occasional- a couple times a month  . Drug use: No  . Sexual activity: Not on file   Other Topics Concern  .  Not on file   Social History Narrative   Lives at home w/ his wife   Right-handed   Caffeine: 1 cup of coffee    Family History  Problem Relation Age of Onset  . COPD Mother   . Heart disease Father   . Neuropathy Neg Hx     Past Medical History:  Diagnosis Date  .  Cancer ([place])    NHL  . Diverticulitis   . GERD (gastroesophageal reflux disease)    Heartburn at times  . Heart murmur    "comes and goes" onset 4th grade - last time anyone heard it was 01/2014  . History of hiatal hernia   . History of kidney stones   . HOH (hard of hearing)   . Non-Hodgkin lymphoma ([place])   . Peripheral neuropathy 09/11/2016  . PONV (postoperative nausea and vomiting)   . Rosacea   . Wears glasses     Past Surgical History:  Procedure Laterality Date  . APPENDECTOMY     2-3 yrs old  . COLONOSCOPY    . HEMORROIDECTOMY     inflammed anal gland removed  . INGUINAL HERNIA REPAIR Left 02/11/2014   Procedure: LEFT INGUINAL HERNIA REPAIR WITH MESH ;  Surgeon: Joyice Faster. Cornett, MD;  Location: [facility];  Service: General;  Laterality: Left;  . INSERTION OF MESH N/A 02/11/2014   Procedure: INSERTION OF MESH;  Surgeon: Joyice Faster. Cornett, MD;  Location: [facility];  Service: General;  Laterality: N/A;  . PORTACATH PLACEMENT Left 04/10/2016   Procedure: INSERTION PORT-A-CATH into LEFT Subclavian Vein with guidance from ultrasound and flouroscopy;  Surgeon: Grace Isaac, MD;  Location: [facility];  Service: Thoracic;  Laterality: Left;  . TONSILLECTOMY    . VIDEO BRONCHOSCOPY WITH ENDOBRONCHIAL ULTRASOUND N/A 03/30/2016   Procedure: VIDEO BRONCHOSCOPY WITH ENDOBRONCHIAL Gerrit Heck TRANSBRONCHIAL BIOPSY;  Surgeon: Grace Isaac, MD;  Location: [facility];  Service: Thoracic;  Laterality: N/A;    Current Outpatient Prescriptions  Medication Sig Dispense Refill  . ALPHA LIPOIC ACID PO Take 600 mg by mouth daily.    Marland Kitchen ampicillin (PRINCIPEN) 500 MG capsule Take 500 mg by mouth daily.    . famotidine (PEPCID) 40 MG tablet Take 40 mg by mouth daily as needed for heartburn.     . Glucosamine HCl (GLUCOSAMINE PO) Take 1,800 mg by mouth 3 (three) times daily.    Marland Kitchen levothyroxine (SYNTHROID, LEVOTHROID) 25 MCG tablet   0  . metroNIDAZOLE (METROGEL)  0.75 % gel Apply 1 application topically daily.     . Multiple Vitamins-Minerals (CENTRUM SILVER PO) Take 1 tablet by mouth daily.     Marland Kitchen senna (SENOKOT) 8.6 MG tablet Take 1 tablet by mouth daily as needed for constipation.    . vitamin E 400 UNIT capsule Take 400 Units by mouth daily.     No current facility-administered medications for this visit.     Allergies as of 02/12/2017 - Review Complete 02/12/2017  Allergen Reaction Noted  . No known allergies  04/09/2016    Vitals: BP 117/73   Pulse 85   Ht _0  (1.727 m)   Wt 189 lb 11.2 oz (86 kg)   BMI 28.84 kg/m  Last Weight:  Wt Readings from Last 1 Encounters:  02/12/17 189 lb 11.2 oz (86 kg)   Last Height:   Ht Readings from Last 1 Encounters:  02/12/17 _1  (1.727 m)   Intact UE strength Left DF 5- Right DF  4+ Hip flexion 4+/5 Otherwise LE strength 5/5 Distal decrease to pin prick near the toes but intact more proximally in the legs and intact in the arms Proprioception intact in the great toes Romberg negative Can get out of seat without using hands Toe and tandem with imbalabce   Assessment/Plan:   Seth Gilbert is a very lovely 81 y.o. male here as a referral from Dr. Harrington Challenger for neuropathy likely chemotherapy related. vinca, alkaloid (vincristine, vinblastine, vinorelbine) can cause a sensorimotor, cranial or automatic axonal neuropathy. He received Vincristine in his therapy for stage II large B cell non-Hodgkin lymphoma. 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 Had a long discussion with patient and his wife regarding neuropathy, small versus large fiber sensory versus motor neuropathy. He has imbalance, falls, abnormal gait which is due to a sensorimotor neuropathy and I discussed this in detail. Fortunately he is improving and he denies any significant pain. He can try alpha  lipoic acid which has been shown beneficial in diabetic neuropathy but not in chemotherapy related neuropathy however may not hurt. At this point recovery depends on time, PT and OT.  Will check serum neuropathy labs in the unlikely chance he has another risk factor for neuropathy such as b12 deficiency (all unremarkable)  Significantly improved, follow up as needed I still think patient can benefit from further physical therapy for his imbalance and lower extremity weakness. He has completed physical therapy but again I do think that he could benefit from further training to avoid falls and strength in lower extremities. We'll refer him back.   Cc:  Dr. Harrington Challenger and Dr. Lorenza Cambridge, MD  [facility] [address] [place], [place] [address]  Phone [phone] Fax [phone]  A total of 20 minutes was spent face-to-face with this patient. Over half this time was spent on counseling patient on the neuropathy due to chemotherapy diagnosis and different diagnostic and therapeutic options available.

## 2017-02-13 ENCOUNTER — Encounter: Payer: Medicare Other | Admitting: Occupational Therapy

## 2017-02-13 ENCOUNTER — Ambulatory Visit: Payer: Medicare Other | Admitting: Physical Therapy

## 2017-02-14 DIAGNOSIS — L719 Rosacea, unspecified: Secondary | ICD-10-CM | POA: Diagnosis not present

## 2017-02-14 DIAGNOSIS — L570 Actinic keratosis: Secondary | ICD-10-CM | POA: Diagnosis not present

## 2017-02-20 ENCOUNTER — Ambulatory Visit: Payer: Medicare Other | Admitting: Physical Therapy

## 2017-02-20 ENCOUNTER — Encounter: Payer: Medicare Other | Admitting: Occupational Therapy

## 2017-02-21 ENCOUNTER — Telehealth: Payer: Self-pay | Admitting: Medical Oncology

## 2017-02-21 NOTE — Telephone Encounter (Signed)
Pt aware of new f/u appt.

## 2017-02-21 NOTE — Telephone Encounter (Signed)
Cannot make appt 7/24-he will be out of town. He would like appt closer to scan which is July 11th and before July 20 . Schedule request sent for July 18

## 2017-02-22 ENCOUNTER — Ambulatory Visit: Payer: Medicare Other | Admitting: Physical Therapy

## 2017-02-22 ENCOUNTER — Encounter: Payer: Medicare Other | Admitting: Occupational Therapy

## 2017-02-26 ENCOUNTER — Ambulatory Visit (HOSPITAL_BASED_OUTPATIENT_CLINIC_OR_DEPARTMENT_OTHER): Payer: Medicare Other

## 2017-02-26 ENCOUNTER — Other Ambulatory Visit (HOSPITAL_BASED_OUTPATIENT_CLINIC_OR_DEPARTMENT_OTHER): Payer: Medicare Other

## 2017-02-26 ENCOUNTER — Other Ambulatory Visit: Payer: Medicare Other

## 2017-02-26 DIAGNOSIS — Z95828 Presence of other vascular implants and grafts: Secondary | ICD-10-CM

## 2017-02-26 DIAGNOSIS — G6289 Other specified polyneuropathies: Secondary | ICD-10-CM

## 2017-02-26 DIAGNOSIS — C8338 Diffuse large B-cell lymphoma, lymph nodes of multiple sites: Secondary | ICD-10-CM

## 2017-02-26 LAB — CBC WITH DIFFERENTIAL/PLATELET
BASO%: 0.3 % (ref 0.0–2.0)
Basophils Absolute: 0 10*3/uL (ref 0.0–0.1)
EOS%: 2.6 % (ref 0.0–7.0)
Eosinophils Absolute: 0.2 10*3/uL (ref 0.0–0.5)
HEMATOCRIT: 37.6 % — AB (ref 38.4–49.9)
HGB: 12.8 g/dL — ABNORMAL LOW (ref 13.0–17.1)
LYMPH#: 1.3 10*3/uL (ref 0.9–3.3)
LYMPH%: 23.3 % (ref 14.0–49.0)
MCH: 33.4 pg (ref 27.2–33.4)
MCHC: 34 g/dL (ref 32.0–36.0)
MCV: 98.2 fL — ABNORMAL HIGH (ref 79.3–98.0)
MONO#: 0.5 10*3/uL (ref 0.1–0.9)
MONO%: 8 % (ref 0.0–14.0)
NEUT%: 65.8 % (ref 39.0–75.0)
NEUTROS ABS: 3.8 10*3/uL (ref 1.5–6.5)
PLATELETS: 136 10*3/uL — AB (ref 140–400)
RBC: 3.83 10*6/uL — AB (ref 4.20–5.82)
RDW: 14 % (ref 11.0–14.6)
WBC: 5.8 10*3/uL (ref 4.0–10.3)

## 2017-02-26 LAB — TECHNOLOGIST REVIEW

## 2017-02-26 LAB — COMPREHENSIVE METABOLIC PANEL
ALT: 15 U/L (ref 0–55)
ANION GAP: 7 meq/L (ref 3–11)
AST: 20 U/L (ref 5–34)
Albumin: 3.6 g/dL (ref 3.5–5.0)
Alkaline Phosphatase: 74 U/L (ref 40–150)
BILIRUBIN TOTAL: 0.47 mg/dL (ref 0.20–1.20)
BUN: 15.7 mg/dL (ref 7.0–26.0)
CO2: 26 mEq/L (ref 22–29)
CREATININE: 0.9 mg/dL (ref 0.7–1.3)
Calcium: 9.2 mg/dL (ref 8.4–10.4)
Chloride: 107 mEq/L (ref 98–109)
EGFR: 81 mL/min/{1.73_m2} — ABNORMAL LOW (ref >90)
Glucose: 116 mg/dl (ref 70–140)
Potassium: 4.3 mEq/L (ref 3.5–5.1)
Sodium: 140 mEq/L (ref 136–145)
TOTAL PROTEIN: 5.9 g/dL — AB (ref 6.4–8.3)

## 2017-02-26 MED ORDER — HEPARIN SOD (PORK) LOCK FLUSH 100 UNIT/ML IV SOLN
500.0000 [IU] | Freq: Once | INTRAVENOUS | Status: AC | PRN
  Administered 2017-02-26: 500 [IU] via INTRAVENOUS
  Filled 2017-02-26: qty 5

## 2017-02-26 MED ORDER — SODIUM CHLORIDE 0.9% FLUSH
10.0000 mL | INTRAVENOUS | Status: DC | PRN
  Administered 2017-02-26: 10 mL via INTRAVENOUS
  Filled 2017-02-26: qty 10

## 2017-02-26 NOTE — Patient Instructions (Signed)

## 2017-03-05 ENCOUNTER — Ambulatory Visit (HOSPITAL_COMMUNITY): Admission: RE | Admit: 2017-03-05 | Payer: Medicare Other | Source: Ambulatory Visit

## 2017-03-06 ENCOUNTER — Ambulatory Visit (HOSPITAL_COMMUNITY)
Admission: RE | Admit: 2017-03-06 | Discharge: 2017-03-06 | Disposition: A | Discharge status: Home / Self Care | Payer: Medicare Other | Source: Ambulatory Visit | Attending: Internal Medicine | Admitting: Internal Medicine

## 2017-03-06 ENCOUNTER — Other Ambulatory Visit: Payer: Medicare Other

## 2017-03-06 ENCOUNTER — Encounter (HOSPITAL_COMMUNITY): Payer: Self-pay

## 2017-03-06 DIAGNOSIS — I2510 Atherosclerotic heart disease of native coronary artery without angina pectoris: Secondary | ICD-10-CM | POA: Insufficient documentation

## 2017-03-06 DIAGNOSIS — C8580 Other specified types of non-Hodgkin lymphoma, unspecified site: Secondary | ICD-10-CM | POA: Diagnosis not present

## 2017-03-06 DIAGNOSIS — G6289 Other specified polyneuropathies: Secondary | ICD-10-CM | POA: Diagnosis not present

## 2017-03-06 DIAGNOSIS — I700 Atherosclerosis of aorta: Secondary | ICD-10-CM | POA: Diagnosis not present

## 2017-03-06 DIAGNOSIS — C8338 Diffuse large B-cell lymphoma, lymph nodes of multiple sites: Secondary | ICD-10-CM | POA: Diagnosis not present

## 2017-03-06 MED ORDER — IOPAMIDOL (ISOVUE-300) INJECTION 61%
INTRAVENOUS | Status: AC
  Administered 2017-03-06: 100 mL
  Filled 2017-03-06: qty 100

## 2017-03-12 ENCOUNTER — Ambulatory Visit: Payer: Medicare Other | Admitting: Internal Medicine

## 2017-03-13 ENCOUNTER — Encounter: Payer: Self-pay | Admitting: Internal Medicine

## 2017-03-13 ENCOUNTER — Ambulatory Visit (HOSPITAL_BASED_OUTPATIENT_CLINIC_OR_DEPARTMENT_OTHER): Payer: Medicare Other | Admitting: Internal Medicine

## 2017-03-13 VITALS — BP 136/61 | HR 91 | Temp 97.8°F | Resp 18 | Ht 68.0 in | Wt 193.1 lb

## 2017-03-13 DIAGNOSIS — C8338 Diffuse large B-cell lymphoma, lymph nodes of multiple sites: Secondary | ICD-10-CM

## 2017-03-13 NOTE — Progress Notes (Signed)
[place] Telephone:([phone]) [phone]   Fax:([phone]) [phone]  OFFICE PROGRESS NOTE  Lawerance Cruel, MD [place] [address]  DIAGNOSIS: Stage II large B-cell non-Hodgkin lymphoma diagnosed in August 2017.  PRIOR THERAPY: Systemic chemotherapy with CHOP/Rituxan every 3 weeks with Neulasta support, status post 6 cycles.  Last dose was given on 08/14/2016.  CURRENT THERAPY: Observation..  INTERVAL HISTORY: Seth Gilbert 81 y.o. male returns to the clinic today for six-month follow-up visit. The patient is feeling fine today with no specific complaints. He has intermittent sensitivity in the [place]. He denied having any chest pain, shortness of breath, cough or hemoptysis. He denied having any fever or chills. He has no significant recent weight loss or night sweats. The patient has been observation for the last 7 months and he had repeat CT scan of the chest, abdomen and pelvis performed recently. He is here for evaluation and discussion of his scan results.  MEDICAL HISTORY: Past Medical History:  Diagnosis Date  . Cancer ([place])    NHL  . Diverticulitis   . GERD (gastroesophageal reflux disease)    Heartburn at times  . Heart murmur    "comes and goes" onset 4th grade - last time anyone heard it was 01/2014  . History of hiatal hernia   . History of kidney stones   . HOH (hard of hearing)   . Non-Hodgkin lymphoma ([place])   . Peripheral neuropathy 09/11/2016  . PONV (postoperative nausea and vomiting)   . Rosacea   . Wears glasses     ALLERGIES:  is allergic to no known allergies.  MEDICATIONS:  Current Outpatient Prescriptions  Medication Sig Dispense Refill  . ALPHA LIPOIC ACID PO Take 600 mg by mouth daily.    Marland Kitchen ampicillin (PRINCIPEN) 500 MG capsule Take 500 mg by mouth every other day.     . famotidine (PEPCID) 40 MG tablet Take 40 mg by mouth daily as needed for heartburn.     . Glucosamine HCl (GLUCOSAMINE PO) Take 1,800 mg by  mouth 3 (three) times daily.    Marland Kitchen levothyroxine (SYNTHROID, LEVOTHROID) 25 MCG tablet   0  . metroNIDAZOLE (METROGEL) 0.75 % gel Apply 1 application topically daily.     . Multiple Vitamins-Minerals (CENTRUM SILVER PO) Take 1 tablet by mouth daily.     Marland Kitchen senna (SENOKOT) 8.6 MG tablet Take 1 tablet by mouth daily as needed for constipation.    . vitamin E 400 UNIT capsule Take 400 Units by mouth daily.     No current facility-administered medications for this visit.     SURGICAL HISTORY:  Past Surgical History:  Procedure Laterality Date  . APPENDECTOMY     2-81 yrs old  . COLONOSCOPY    . HEMORROIDECTOMY     inflammed anal gland removed  . INGUINAL HERNIA REPAIR Left 02/11/2014   Procedure: LEFT INGUINAL HERNIA REPAIR WITH MESH ;  Surgeon: Joyice Faster. Cornett, MD;  Location: [facility];  Service: General;  Laterality: Left;  . INSERTION OF MESH N/A 02/11/2014   Procedure: INSERTION OF MESH;  Surgeon: Joyice Faster. Cornett, MD;  Location: [facility];  Service: General;  Laterality: N/A;  . PORTACATH PLACEMENT Left 04/10/2016   Procedure: INSERTION PORT-A-CATH into LEFT Subclavian Vein with guidance from ultrasound and flouroscopy;  Surgeon: Grace Isaac, MD;  Location: [facility];  Service: Thoracic;  Laterality: Left;  . TONSILLECTOMY    . VIDEO  BRONCHOSCOPY WITH ENDOBRONCHIAL ULTRASOUND N/A 03/30/2016   Procedure: VIDEO BRONCHOSCOPY WITH ENDOBRONCHIAL Gerrit Heck TRANSBRONCHIAL BIOPSY;  Surgeon: Grace Isaac, MD;  [facility];  Service: Thoracic;  Laterality: N/A;    REVIEW OF SYSTEMS:  A comprehensive review of systems was negative.   PHYSICAL EXAMINATION: General appearance: alert, cooperative and no distress Head: Normocephalic, without obvious abnormality, atraumatic Neck: no adenopathy, no JVD, supple, symmetrical, trachea midline and thyroid not enlarged, symmetric, no tenderness/mass/nodules Lymph nodes: Cervical, supraclavicular, and axillary  nodes normal. Resp: clear to auscultation bilaterally Back: symmetric, no curvature. ROM normal. No CVA tenderness. Cardio: regular rate and rhythm, S1, S2 normal, no murmur, click, rub or gallop GI: soft, non-tender; bowel sounds normal; no masses,  no organomegaly Extremities: extremities normal, atraumatic, no cyanosis or edema  ECOG PERFORMANCE STATUS: 0 - Asymptomatic  Blood pressure 136/61, pulse 91, temperature 97.8 F (36.6 C), temperature source Oral, resp. rate 18, height 5\' 8"  (1.727 m), weight 193 lb 1.6 oz (87.6 kg), SpO2 100 %.  LABORATORY DATA: Lab Results  Component Value Date   WBC 5.8 02/26/2017   HGB 12.8 (L) 02/26/2017   HCT 37.6 (L) 02/26/2017   MCV 98.2 (H) 02/26/2017   PLT 136 (L) 02/26/2017      Chemistry      Component Value Date/Time   NA 140 02/26/2017 1326   K 4.3 02/26/2017 1326   CL 109 06/18/2016 0319   CO2 26 02/26/2017 1326   BUN 15.7 02/26/2017 1326   CREATININE 0.9 02/26/2017 1326      Component Value Date/Time   CALCIUM 9.2 02/26/2017 1326   ALKPHOS 74 02/26/2017 1326   AST 20 02/26/2017 1326   ALT 15 02/26/2017 1326   BILITOT 0.47 02/26/2017 1326       RADIOGRAPHIC STUDIES: Ct Chest W Contrast  Result Date: 03/06/2017 CLINICAL DATA:  Lymphoma, chemotherapy completed 6 months ago. EXAM: CT CHEST, ABDOMEN, AND PELVIS WITH CONTRAST TECHNIQUE: Multidetector CT imaging of the chest, abdomen and pelvis was performed following the standard protocol during bolus administration of intravenous contrast. CONTRAST:  174mL ISOVUE-300 IOPAMIDOL (ISOVUE-300) INJECTION 61% COMPARISON:  PET 09/10/2016 and CT chest abdomen pelvis 06/22/2016. FINDINGS: CT CHEST FINDINGS Cardiovascular: Atherosclerotic calcification of the arterial vasculature, including coronary arteries. Heart size normal. Small amount of pericardial fluid is likely physiologic. Mediastinum/Nodes: No pathologically enlarged mediastinal, hilar or axillary lymph nodes. Esophagus is  grossly unremarkable. Small hiatal hernia. Lungs/Pleura: Image quality is degraded by respiratory motion. Mild subpleural scarring or atelectasis in the lingula and both lower lobes. Lungs are otherwise clear. No pleural fluid. Airway is unremarkable. Musculoskeletal: Degenerative changes in the spine. No worrisome lytic or sclerotic lesions. CT ABDOMEN PELVIS FINDINGS Hepatobiliary: Liver and gallbladder are unremarkable. No biliary ductal dilatation. Pancreas: Negative. Spleen: Negative. Adrenals/Urinary Tract: Adrenal glands and kidneys are unremarkable. Ureters are decompressed. Bladder is grossly unremarkable. Stomach/Bowel: Small hiatal hernia. Stomach, small bowel and colon are unremarkable. Appendix is not readily visualized. Vascular/Lymphatic: Atherosclerotic calcification of the arterial vasculature without abdominal aortic aneurysm. No pathologically enlarged lymph nodes. Reproductive: Prostate is visualized. Other: No free fluid.  Mesenteries and peritoneum are unremarkable. Musculoskeletal: No worrisome lytic or sclerotic lesions. Degenerative changes are seen in the spine. IMPRESSION: 1. No evidence of recurrent lymphoma. 2. Aortic atherosclerosis (ICD10-170.0). Coronary artery calcification. Electronically Signed   By: Lorin Picket M.D.   On: 03/06/2017 14:53   Ct Abdomen Pelvis W Contrast  Result Date: 03/06/2017 CLINICAL DATA:  Lymphoma, chemotherapy completed 6 months ago. EXAM:  CT CHEST, ABDOMEN, AND PELVIS WITH CONTRAST TECHNIQUE: Multidetector CT imaging of the chest, abdomen and pelvis was performed following the standard protocol during bolus administration of intravenous contrast. CONTRAST:  153mL ISOVUE-300 IOPAMIDOL (ISOVUE-300) INJECTION 61% COMPARISON:  PET 09/10/2016 and CT chest abdomen pelvis 06/22/2016. FINDINGS: CT CHEST FINDINGS Cardiovascular: Atherosclerotic calcification of the arterial vasculature, including coronary arteries. Heart size normal. Small amount of  pericardial fluid is likely physiologic. Mediastinum/Nodes: No pathologically enlarged mediastinal, hilar or axillary lymph nodes. Esophagus is grossly unremarkable. Small hiatal hernia. Lungs/Pleura: Image quality is degraded by respiratory motion. Mild subpleural scarring or atelectasis in the lingula and both lower lobes. Lungs are otherwise clear. No pleural fluid. Airway is unremarkable. Musculoskeletal: Degenerative changes in the spine. No worrisome lytic or sclerotic lesions. CT ABDOMEN PELVIS FINDINGS Hepatobiliary: Liver and gallbladder are unremarkable. No biliary ductal dilatation. Pancreas: Negative. Spleen: Negative. Adrenals/Urinary Tract: Adrenal glands and kidneys are unremarkable. Ureters are decompressed. Bladder is grossly unremarkable. Stomach/Bowel: Small hiatal hernia. Stomach, small bowel and colon are unremarkable. Appendix is not readily visualized. Vascular/Lymphatic: Atherosclerotic calcification of the arterial vasculature without abdominal aortic aneurysm. No pathologically enlarged lymph nodes. Reproductive: Prostate is visualized. Other: No free fluid.  Mesenteries and peritoneum are unremarkable. Musculoskeletal: No worrisome lytic or sclerotic lesions. Degenerative changes are seen in the spine. IMPRESSION: 1. No evidence of recurrent lymphoma. 2. Aortic atherosclerosis (ICD10-170.0). Coronary artery calcification. Electronically Signed   By: Lorin Picket M.D.   On: 03/06/2017 14:53    ASSESSMENT AND PLAN: This is a very pleasant 81 years old white male with stage II large B-cell non-Hodgkin lymphoma diagnosed in August 2017. He is status post 6 cycles of systemic chemotherapy with CHOP/Rituxan. He tolerated his treatment fairly well except for chemotherapy-induced pancytopenia and weakness.  He is currently on observation. He had repeat CT scan of the chest, abdomen and pelvis performed recently. I personally and independently reviewed the scans and discussed the results  with the patient today. Has a scan showed no evidence for disease recurrence. I recommended for the patient to continue on observation with repeat CBC, comprehensive metabolic panel and LDH in 6 months. He was advised to call immediately if he has any concerning symptoms in the interval. The patient voices understanding of current disease status and treatment options and is in agreement with the current care plan. All questions were answered. The patient knows to call the clinic with any problems, questions or concerns. We can certainly see the patient much sooner if necessary. I spent 10 minutes counseling the patient face to face. The total time spent in the appointment was 15 minutes.  Disclaimer: This note was dictated with voice recognition software. Similar sounding words can inadvertently be transcribed and may not be corrected upon review.

## 2017-03-19 ENCOUNTER — Ambulatory Visit: Payer: Medicare Other | Admitting: Internal Medicine

## 2017-03-26 DIAGNOSIS — E039 Hypothyroidism, unspecified: Secondary | ICD-10-CM | POA: Diagnosis not present

## 2017-03-27 DIAGNOSIS — I700 Atherosclerosis of aorta: Secondary | ICD-10-CM | POA: Diagnosis not present

## 2017-03-27 DIAGNOSIS — N644 Mastodynia: Secondary | ICD-10-CM | POA: Diagnosis not present

## 2017-03-27 DIAGNOSIS — E039 Hypothyroidism, unspecified: Secondary | ICD-10-CM | POA: Diagnosis not present

## 2017-03-29 ENCOUNTER — Encounter: Payer: Self-pay | Admitting: Physical Therapy

## 2017-03-29 ENCOUNTER — Ambulatory Visit: Payer: Medicare Other | Attending: Neurology | Admitting: Physical Therapy

## 2017-03-29 DIAGNOSIS — R2681 Unsteadiness on feet: Secondary | ICD-10-CM

## 2017-03-29 DIAGNOSIS — M21371 Foot drop, right foot: Secondary | ICD-10-CM | POA: Diagnosis present

## 2017-03-29 DIAGNOSIS — R262 Difficulty in walking, not elsewhere classified: Secondary | ICD-10-CM | POA: Diagnosis present

## 2017-03-29 DIAGNOSIS — R296 Repeated falls: Secondary | ICD-10-CM | POA: Insufficient documentation

## 2017-03-29 DIAGNOSIS — M6281 Muscle weakness (generalized): Secondary | ICD-10-CM | POA: Insufficient documentation

## 2017-03-29 DIAGNOSIS — M21372 Foot drop, left foot: Secondary | ICD-10-CM | POA: Diagnosis present

## 2017-03-29 NOTE — Therapy (Signed)
[facility] [address] [place], [place], [address] Phone: [phone]   Fax:  [phone]  Physical Therapy Evaluation  Patient Details  Name: Seth Gilbert MRN: [id number] Date of Birth: [birth year] Referring Provider: Berta Minor B. Jaynee Eagles, MD  Encounter Date: 03/29/2017      PT End of Session - 03/29/17 1544    Visit Number 1   Number of Visits 13   Date for PT Re-Evaluation 51/70/01  8 week certification; reassess goals at 4 weeks   Authorization Type [employer] [facility] code and progress note 10th visit   PT Start Time 1402   PT Stop Time 1446   PT Time Calculation (min) 44 min   Activity Tolerance Patient tolerated treatment well   Behavior During Therapy WFL for tasks assessed/performed      Past Medical History:  Diagnosis Date  . Cancer ([place])    NHL  . Diverticulitis   . GERD (gastroesophageal reflux disease)    Heartburn at times  . Heart murmur    "comes and goes" onset 4th grade - last time anyone heard it was 01/2014  . History of hiatal hernia   . History of kidney stones   . HOH (hard of hearing)   . Non-Hodgkin lymphoma ([place])   . Peripheral neuropathy 09/11/2016  . PONV (postoperative nausea and vomiting)   . Rosacea   . Wears glasses     Past Surgical History:  Procedure Laterality Date  . APPENDECTOMY     2-3 yrs old  . COLONOSCOPY    . HEMORROIDECTOMY     inflammed anal gland removed  . INGUINAL HERNIA REPAIR Left 02/11/2014   Procedure: LEFT INGUINAL HERNIA REPAIR WITH MESH ;  Surgeon: Joyice Faster. Cornett, MD;  Location: [facility];  Service: General;  Laterality: Left;  . INSERTION OF MESH N/A 02/11/2014   Procedure: INSERTION OF MESH;  Surgeon: Joyice Faster. Cornett, MD;  Location: [facility];  Service: General;  Laterality: N/A;  . PORTACATH PLACEMENT Left 04/10/2016   Procedure: INSERTION PORT-A-CATH into LEFT Subclavian Vein with guidance from  ultrasound and flouroscopy;  Surgeon: Grace Isaac, MD;  Location: [facility];  Service: Thoracic;  Laterality: Left;  . TONSILLECTOMY    . VIDEO BRONCHOSCOPY WITH ENDOBRONCHIAL ULTRASOUND N/A 03/30/2016   Procedure: VIDEO BRONCHOSCOPY WITH ENDOBRONCHIAL ULTRASOUND,WITH TRANSBRONCHIAL BIOPSY;  Surgeon: Grace Isaac, MD;  Location: [facility];  Service: Thoracic;  Laterality: N/A;    There were no vitals filed for this visit.       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 Pt feels he didn't make sufficient strength gains during the last PT episode of care in order to perform functional activities safely.   Patient is accompained by: --   Pertinent History lymphoma with chemo, HOH   Limitations --   Patient Stated Goals Have sufficient strength and balance to stand from kneeling when working on [employer] and performing other household tasks   Currently in Pain? No/denies            [facility] PT Assessment - 03/29/17 1418      Assessment   Medical Diagnosis chemo induced peripheral neuropathy, LE weakness, imbalance   Referring  Provider Larina Bras. Jaynee Eagles, MD   Onset Date/Surgical Date 02/12/17   Prior Therapy yes     Precautions   Precautions Other (comment)   Precaution Comments chemo central line     Restrictions   Weight Bearing Restrictions No     Balance Screen   Has the patient fallen in the past 6 months No   Has the patient had a decrease in activity level because of a fear of falling?  No   Is the patient reluctant to leave their home because of a fear of falling?  No     Home Ecologist residence   Living Arrangements Spouse/significant other   Available Help at Discharge Family   Type of [place] to enter   Entrance Stairs-Number of Steps 1   [place] Two level;Bed/bath upstairs   Alternate Level Stairs-Number of Steps 13   Alternate Level Stairs-Rails Left     Prior Function   Level of Independence Independent   Leisure mow grass, spend time with family, walking     Cognition   Overall Cognitive Status --   Attention --     Observation/Other Assessments   Focus on Therapeutic Outcomes (FOTO)  72 (28% limited: predicted 23% limited)   Other Surveys  Other Surveys   Activities of Balance Confidence Scale (ABC Scale)  90%     Sensation   Light Touch Impaired by gross assessment   Additional Comments Pt c/o reduced sensation in bilateral toes     ROM / Strength   AROM / PROM / Strength Strength     Strength   Strength Assessment Site Hip;Knee;Ankle   Right/Left Hip Right;Left   Right Hip Flexion 4+/5   Left Hip Flexion 4+/5   Right/Left Knee Right;Left   Right Knee Flexion 4-/5   Right Knee Extension 5/5   Left Knee Flexion 4+/5   Left Knee Extension 5/5   Right/Left Ankle Right;Left   Right Ankle Dorsiflexion 4-/5   Left Ankle Dorsiflexion 4+/5     High Level Balance   High Level Balance Comments mCTSIB: condition 1 30s, condition 2 30s, condition 3 30s, condition 4 30s; some increased sway, no LOB     Functional Gait  Assessment   Gait assessed  Yes   Gait Level Surface Walks 20 ft in less than 5.5 sec, no assistive devices, good speed, no evidence for imbalance, normal gait pattern, deviates no more than 6 in outside of the 12 in walkway width.   Change in Gait Speed Able to change speed, demonstrates mild gait deviations, deviates 6-10 in outside of the 12 in walkway width, or no gait deviations, unable to achieve a major change in velocity, or uses a change in velocity, or uses an assistive device.   Gait with Horizontal Head Turns Performs head turns smoothly with slight change in gait velocity (eg, minor disruption to smooth  gait path), deviates 6-10 in outside 12 in walkway width, or uses an assistive device.   Gait with Vertical Head Turns Performs head turns with no change in gait. Deviates no more than 6 in outside 12 in walkway width.   Gait and Pivot Turn Pivot turns safely within 3 sec and stops quickly with no loss of balance.   Step Over Obstacle Is able to step over 2 stacked shoe boxes taped together (9 in total height) without changing gait speed. No evidence of imbalance.   Gait with  Narrow Base of Support Ambulates less than 4 steps heel to toe or cannot perform without assistance.   Gait with Eyes Closed Cannot walk 20 ft without assistance, severe gait deviations or imbalance, deviates greater than 15 in outside 12 in walkway width or will not attempt task.   Ambulating Backwards Walks 20 ft, no assistive devices, good speed, no evidence for imbalance, normal gait   Steps Alternating feet, no rail.   Total Score 22   FGA comment: 22/30 = predictive of fall risk            Objective measurements completed on examination: See above findings.          [facility] Adult PT Treatment/Exercise - 03/29/17 1427      Therapeutic Activites    Therapeutic Activities Other Therapeutic Activities   Other Therapeutic Activities Unable to stand from kneeling without UE support                PT Education - 03/29/17 1613    Education provided Yes   Education Details Discussed clinical findings, PT goals and plan of care   Person(s) Educated Patient   Methods Explanation   Comprehension Verbalized understanding          PT Short Term Goals - 12/19/16 1238      PT SHORT TERM GOAL #1   Title (TARGET DATE FOR STG 12/19/16)  Pt will participate in FGA with LTG to be set   Status Achieved     PT SHORT TERM GOAL #2   Title Pt will improve functional LE strength as indicated by decrease in 5x sit to stand score of <10 seconds   Baseline 12/19/16: 11.10 sec's today, improved from 12.32 sec's at  eval   Status Partially Met     PT [place] #3   Title Pt will improve safety with gait as indicated by improvement in gait velocity to >3.5 ft/sec   Baseline 12/19/16: 3.24 ft/sec today, improved from 3.07 ft/sec at eval   Status Partially Met     PT SHORT TERM GOAL #4   Title Pt will negotiate 8 stairs, alternating sequence with no rail and supervision    Baseline 12/19/16: met today   Status Achieved     PT SHORT TERM GOAL #5   Title Pt will be able to maintain 30 seconds on all conditions on MCTSIB to indicate improved use of all sensory systems   Baseline 12/19/16: met today   Status Achieved           PT Long Term Goals - 03/29/17 1534      PT LONG TERM GOAL #1   Title Pt will improve FGA score to 26/30 to reduce risk of falls   Baseline 22/30   Status New   Target Date 04/28/17     PT LONG TERM GOAL #2   Title Pt will improve LE strength to 4+/5 bilaterally throughout   Status New   Target Date 04/28/17     PT LONG TERM GOAL #3   Title Pt will improve ABC by 5 to 8%   Baseline 90%   Status New   Target Date 04/28/17     PT LONG TERM GOAL #4   Title Pt will be able to stand from kneeling with no UE support   Status New   Target Date 04/28/17                Plan - 03/29/17 1526  Clinical Impression Statement Pt is a 81 year old male presenting to OPPT neuro for low complexity PT evaluation for chemo induced peripheral neuropathy, reduced strength and imbalance. Pt's PMH significant for the following: lymphoma and chemotherapy, hypothyroidism. The following deficits were noted during pt's exam: LE muscle weakness, impaired sensation and impaired balance. Pt's FGA score indicates patient is at increased fall risk during dynamic gait challenges. Pt's condition is stable and he would benefit from skilled PT to address these impairments and functional limitations to maximize functional mobility independence and reduce falls risk.   History and Personal  Factors relevant to plan of care: lymphoma, chemo, hypothyroidism, HOH   Clinical Presentation Stable   Clinical Presentation due to: Neuropathy is improving, continues to present with functional LE weakness and impaired balance affecting higher level gait challenges   Clinical Decision Making Low   Rehab Potential Excellent   Clinical Impairments Affecting Rehab Potential chemo induced peripheral neuropathy   PT Frequency Other (comment)  2x week for 4 weeks then 1x week for 4 weeks   PT Duration 8 weeks   PT Treatment/Interventions Gait training;Stair training;Functional mobility training;Therapeutic activities;Therapeutic exercise;Balance training;Neuromuscular re-education;Patient/family education;Energy conservation;ADLs/Self Care Home Management   PT Next Visit Plan Review current HEP and advance as needed, LE strengthening, practice standing from kneeling, balance and higher level gait activities (eg: tandem standing, head turns, compliant surfaces)   PT Home Exercise Plan Pt reports [facility] [employer] [facility]   Consulted and Agree with Plan of Care Patient      Patient will benefit from skilled therapeutic intervention in order to improve the following deficits and impairments:  Decreased balance, Decreased strength, Impaired sensation, Decreased activity tolerance, Decreased endurance  Visit Diagnosis: Unsteadiness on feet  Muscle weakness (generalized)      G-Codes - April 09, 2017 1633    Functional Assessment Tool Used (Outpatient Only) FGA: 22/30   Mobility: Walking and Moving Around Current Status ([id number]) At least 20 percent but less than 40 percent impaired, limited or restricted   Mobility: Walking and Moving Around Goal Status [phone]) At least 1 percent but less than 20 percent impaired, limited or restricted     Raylene Everts, PT, DPT 2017/04/09    4:41 PM     Problem List Patient Active Problem List   Diagnosis Date Noted  . Neuropathy due  to chemotherapeutic drug ([place]) 02/12/2017  . Port catheter in place 11/06/2016  . Peripheral neuropathy 09/11/2016  . Atypical pneumonia 06/18/2016  . Acute respiratory failure with hypoxia ([place]) 06/18/2016  . Fever 06/16/2016  . Lactic acidosis 06/16/2016  . Dehydration 06/16/2016  . Encounter for antineoplastic chemotherapy 04/17/2016  . NHL (non-Hodgkin's lymphoma) ([place]) 04/05/2016  . Diffuse large B-cell lymphoma of lymph nodes of multiple regions ([place]) 04/05/2016  . Lung mass 03/13/2016  . Post-operative state 03/08/2014  . Left inguinal hernia 05/08/2013    Gershon Crane 09-Apr-2017, 4:35 PM  [facility] [address] [place], [place], [address] Phone: [phone]   Fax:  [phone]  Name: Seth Gilbert MRN: [id number] Date of Birth: [birth year]

## 2017-04-01 ENCOUNTER — Ambulatory Visit: Payer: Medicare Other | Admitting: Rehabilitation

## 2017-04-01 ENCOUNTER — Encounter: Payer: Self-pay | Admitting: Rehabilitation

## 2017-04-01 DIAGNOSIS — R2681 Unsteadiness on feet: Secondary | ICD-10-CM

## 2017-04-01 DIAGNOSIS — M6281 Muscle weakness (generalized): Secondary | ICD-10-CM

## 2017-04-01 NOTE — Patient Instructions (Signed)
    Step ups:  Stand in front of your stairs.  Use one hand for support only.  Place one foot on first or second step (you can play with this) and advance opposite leg to 4th step and tap lightly before returning to the ground.  Perform x 10 reps on each side once per day.       STEP DOWN - LATERAL  Start with both feet on top of a step/box. Next, slowly lower one leg down off the side of the step/box to lightly touch the heel (work towards this but start by placing whole foot to floor) to the floor. Then return to the original position with both feet on the step/box.   Maintain proper knee alignment: Knee in line with the 2nd toe and not passing in front of the toes. Use your stairs for support as needed.

## 2017-04-01 NOTE — Therapy (Signed)
[facility] [address] [place], [place], [address] Phone: [phone]   Fax:  [phone]  Physical Therapy Treatment  Patient Details  Name: Seth Gilbert MRN: [id number] Date of Birth: November 08, 1935 Referring Provider: Berta Minor B. Jaynee Eagles, MD  Encounter Date: 04/01/2017      PT End of Session - 04/01/17 2029    Visit Number 2   Number of Visits 13   Date for PT Re-Evaluation 40/08/67  8 week certification; reassess goals at 4 weeks   Authorization Type [employer] [facility] code and progress note 10th visit   PT Start Time 1317   PT Stop Time 1400   PT Time Calculation (min) 43 min   Activity Tolerance Patient tolerated treatment well   Behavior During Therapy WFL for tasks assessed/performed      Past Medical History:  Diagnosis Date  . Cancer ([place])    NHL  . Diverticulitis   . GERD (gastroesophageal reflux disease)    Heartburn at times  . Heart murmur    "comes and goes" onset 4th grade - last time anyone heard it was 01/2014  . History of hiatal hernia   . History of kidney stones   . HOH (hard of hearing)   . Non-Hodgkin lymphoma ([place])   . Peripheral neuropathy 09/11/2016  . PONV (postoperative nausea and vomiting)   . Rosacea   . Wears glasses     Past Surgical History:  Procedure Laterality Date  . APPENDECTOMY     2-3 yrs old  . COLONOSCOPY    . HEMORROIDECTOMY     inflammed anal gland removed  . INGUINAL HERNIA REPAIR Left 02/11/2014   Procedure: LEFT INGUINAL HERNIA REPAIR WITH MESH ;  Surgeon: Joyice Faster. Cornett, MD;  Location: [facility];  Service: General;  Laterality: Left;  . INSERTION OF MESH N/A 02/11/2014   Procedure: INSERTION OF MESH;  Surgeon: Joyice Faster. Cornett, MD;  Location: [facility];  Service: General;  Laterality: N/A;  . PORTACATH PLACEMENT Left 04/10/2016   Procedure: INSERTION PORT-A-CATH into LEFT Subclavian Vein with guidance from  ultrasound and flouroscopy;  Surgeon: Grace Isaac, MD;  Location: [facility];  Service: Thoracic;  Laterality: Left;  . TONSILLECTOMY    . VIDEO BRONCHOSCOPY WITH ENDOBRONCHIAL ULTRASOUND N/A 03/30/2016   Procedure: VIDEO BRONCHOSCOPY WITH ENDOBRONCHIAL ULTRASOUND,WITH TRANSBRONCHIAL BIOPSY;  Surgeon: Grace Isaac, MD;  Location: [facility];  Service: Thoracic;  Laterality: N/A;    There were no vitals filed for this visit.      Subjective Assessment - 04/01/17 1322    Subjective Reports no changes, no falls.     Patient is accompained by: Family member   Pertinent History lymphoma with chemo, HOH   Limitations Walking;Other (comment)   Patient Stated Goals Have sufficient strength and balance to stand from kneeling when working on [employer] and performing other household tasks   Currently in Pain? No/denies                         [facility] Adult PT Treatment/Exercise - 04/01/17 0001      High Level Balance   High Level Balance Comments Briefly went over current balance (corner) HEP.  Upgraded standing on pillow with feet together EO and head turns to [facility] with head turns.  Performed in all directions x 10 reps.  Also performed feet together EC x 30 secs.  Maintained this exercise.  Therapeutic Activites    Therapeutic Activities Other Therapeutic Activities   Other Therapeutic Activities Performed transitions from standing>half kneeling>tall kneeling and vice versa many reps during session.   Began with tall kneeling squats for BLE flexibility and balance with return to tall kneeling without UE support x 10 reps, progressed to tall kneeling<>half kneeling 5 reps on each side and finally tall kneeling to half kneeling to standing and vice versa x 3 reps on each side.  Cues for support on leg.  Requires min A to get into full standing position however pt was able to provide light support with opposite hand on floor to assist with pushing up.  Would still recommend external  support at this time.       Exercises   Other Exercises  Step ups with RLE on 2nd 6" step while advancing LLE up to 4th step and back to ground with single UE support x 10 reps on each side.  Step downs (laterally) x 10 reps on both sides with cues for landing softly with whole foot and working towards landing with heel only as able to increase BLE quad strength.  Added to HEP.                  PT Education - 04/01/17 2028    Education provided Yes   Education Details additions/modifications to HEP   Person(s) Educated Patient   Methods Explanation   Comprehension Verbalized understanding          PT Short Term Goals - 12/19/16 1238      PT SHORT TERM GOAL #1   Title (TARGET DATE FOR STG 12/19/16)  Pt will participate in FGA with LTG to be set   Status Achieved     PT SHORT TERM GOAL #2   Title Pt will improve functional LE strength as indicated by decrease in 5x sit to stand score of <10 seconds   Baseline 12/19/16: 11.10 sec's today, improved from 12.32 sec's at eval   Status Partially Met     PT SHORT TERM GOAL #3   Title Pt will improve safety with gait as indicated by improvement in gait velocity to >3.5 ft/sec   Baseline 12/19/16: 3.24 ft/sec today, improved from 3.07 ft/sec at eval   Status Partially Met     PT SHORT TERM GOAL #4   Title Pt will negotiate 8 stairs, alternating sequence with no rail and supervision    Baseline 12/19/16: met today   Status Achieved     PT SHORT TERM GOAL #5   Title Pt will be able to maintain 30 seconds on all conditions on MCTSIB to indicate improved use of all sensory systems   Baseline 12/19/16: met today   Status Achieved           PT Long Term Goals - 03/29/17 1534      PT LONG TERM GOAL #1   Title Pt will improve FGA score to 26/30 to reduce risk of falls   Baseline 22/30   Status New   Target Date 04/28/17     PT LONG TERM GOAL #2   Title Pt will improve LE strength to 4+/5 bilaterally throughout   Status New    Target Date 04/28/17     PT LONG TERM GOAL #3   Title Pt will improve ABC by 5 to 8%   Baseline 90%   Status New   Target Date 04/28/17     PT LONG TERM GOAL #4  Title Pt will be able to stand from kneeling with no UE support   Status New   Target Date 04/28/17               Plan - 04/01/17 2030    Clinical Impression Statement Skilled session addressing LTG for floor transfer with tall kneeling/half kneeling and standing transitions to improve safety and technique with this at home.  Also addressed BLE strengthening as well as making upgrades to HEP for balance.  Pt tolerated well.      Rehab Potential Excellent   Clinical Impairments Affecting Rehab Potential chemo induced peripheral neuropathy   PT Frequency Other (comment)  2x week for 4 weeks then 1x week for 4 weeks   PT Duration 8 weeks   PT Treatment/Interventions Gait training;Stair training;Functional mobility training;Therapeutic activities;Therapeutic exercise;Balance training;Neuromuscular re-education;Patient/family education;Energy conservation;ADLs/Self Care Home Management   PT Next Visit Plan Review current HEP and advance as needed, LE strengthening, practice standing from kneeling, balance and higher level gait activities (eg: tandem standing, head turns, compliant surfaces)   PT Home Exercise Plan Pt reports will be [facility] [employer] [facility] [employer] at [facility] end of August   Consulted and Agree with Plan of Care Patient      Patient will benefit from skilled therapeutic intervention in order to improve the following deficits and impairments:  Decreased balance, Decreased strength, Impaired sensation, Decreased activity tolerance, Decreased endurance  Visit Diagnosis: Unsteadiness on feet  Muscle weakness (generalized)     Problem List Patient Active Problem List   Diagnosis Date Noted  . Neuropathy due to chemotherapeutic drug ([place]) 02/12/2017  . Port catheter in place 11/06/2016  . Peripheral  neuropathy 09/11/2016  . Atypical pneumonia 06/18/2016  . Acute respiratory failure with hypoxia ([place]) 06/18/2016  . Fever 06/16/2016  . Lactic acidosis 06/16/2016  . Dehydration 06/16/2016  . Encounter for antineoplastic chemotherapy 04/17/2016  . NHL (non-Hodgkin's lymphoma) ([place]) 04/05/2016  . Diffuse large B-cell lymphoma of lymph nodes of multiple regions ([place]) 04/05/2016  . Lung mass 03/13/2016  . Post-operative state 03/08/2014  . Left inguinal hernia 05/08/2013    Cameron Sprang, PT, MPT [facility] [address] [place], [place], [address] Phone: [phone]   Fax:  [phone] 04/01/17, 8:35 PM  Name: Seth Gilbert MRN: [id number] Date of Birth: 1936/01/05

## 2017-04-05 ENCOUNTER — Encounter: Payer: Self-pay | Admitting: Physical Therapy

## 2017-04-05 ENCOUNTER — Ambulatory Visit: Payer: Medicare Other | Admitting: Physical Therapy

## 2017-04-05 DIAGNOSIS — M6281 Muscle weakness (generalized): Secondary | ICD-10-CM

## 2017-04-05 DIAGNOSIS — R2681 Unsteadiness on feet: Secondary | ICD-10-CM | POA: Diagnosis not present

## 2017-04-05 DIAGNOSIS — R262 Difficulty in walking, not elsewhere classified: Secondary | ICD-10-CM

## 2017-04-07 NOTE — Therapy (Signed)
[facility] [address] [place], [place], [address] Phone: [phone]   Fax:  [phone]  Physical Therapy Treatment  Patient Details  Name: Seth Gilbert MRN: [id number] Date of Birth: March 28, 1936 Referring Provider: Berta Minor B. Jaynee Eagles, MD  Encounter Date: 04/05/2017     04/05/17 1538  PT Visits / Re-Eval  Visit Number 3  Number of Visits 13  Date for PT Re-Evaluation 69/62/95 (8 week certification; reassess goals at 4 weeks)  Authorization  Authorization Type [employer] [facility] code and progress note 10th visit  PT Time Calculation  PT Start Time 1533  PT Stop Time 1615  PT Time Calculation (min) 42 min  PT - End of Session  Equipment Utilized During Treatment Gait belt  Activity Tolerance Patient tolerated treatment well  Behavior During Therapy WFL for tasks assessed/performed    Past Medical History:  Diagnosis Date  . Cancer ([place])    NHL  . Diverticulitis   . GERD (gastroesophageal reflux disease)    Heartburn at times  . Heart murmur    "comes and goes" onset 4th grade - last time anyone heard it was 01/2014  . History of hiatal hernia   . History of kidney stones   . HOH (hard of hearing)   . Non-Hodgkin lymphoma ([place])   . Peripheral neuropathy 09/11/2016  . PONV (postoperative nausea and vomiting)   . Rosacea   . Wears glasses     Past Surgical History:  Procedure Laterality Date  . APPENDECTOMY     2-3 yrs old  . COLONOSCOPY    . HEMORROIDECTOMY     inflammed anal gland removed  . INGUINAL HERNIA REPAIR Left 02/11/2014   Procedure: LEFT INGUINAL HERNIA REPAIR WITH MESH ;  Surgeon: Joyice Faster. Cornett, MD;  Location: [facility];  Service: General;  Laterality: Left;  . INSERTION OF MESH N/A 02/11/2014   Procedure: INSERTION OF MESH;  Surgeon: Joyice Faster. Cornett, MD;  Location: [facility];  Service: General;  Laterality: N/A;  . PORTACATH PLACEMENT  Left 04/10/2016   Procedure: INSERTION PORT-A-CATH into LEFT Subclavian Vein with guidance from ultrasound and flouroscopy;  Surgeon: Grace Isaac, MD;  Location: [facility];  Service: Thoracic;  Laterality: Left;  . TONSILLECTOMY    . VIDEO BRONCHOSCOPY WITH ENDOBRONCHIAL ULTRASOUND N/A 03/30/2016   Procedure: VIDEO BRONCHOSCOPY WITH ENDOBRONCHIAL Gerrit Heck TRANSBRONCHIAL BIOPSY;  Surgeon: Grace Isaac, MD;  Location: [facility];  Service: Thoracic;  Laterality: N/A;    There were no vitals filed for this visit.      04/05/17 1535  Symptoms/Limitations  Subjective No falls. Reports he was so sore after last session he could not walk down the stairs reciprocally as he normally does and needed 6 Advil. It was 48 hours before he could try the new exercises issued that session. No pain/soreness today.   Patient is accompained by: Family member  Pertinent History lymphoma with chemo, HOH  Limitations Walking;Other (comment)  Patient Stated Goals Have sufficient strength and balance to stand from kneeling when working on [employer] and performing other household tasks  Pain Assessment  Currently in Pain? No/denies  Pain Score 0     04/05/17 1539  Neuro Re-ed   Neuro Re-ed Details  tall kneeling activities on mat table: mini squats x 10 reps, walking fwd/bwd x 2 laps across mat- emphasis on tall posture and weight shifting. on red mat on floor next to mat table: tall  kneeling<>half kneeling alternating which leg comes forward x 10 reps each side with min assist to bring left leg forward, mod assit to bring left leg forward. seated on stool on red mat with one leg forward in front of stool, other leg on lateral side of stool with same side buttock off edge: pushing through legs to come up into almost full standing x 5 reps, then only lifting buttock 2-3 inches of stool for 3 sec holds x 5 reps. performed on both sides with UE support on mat table in front of him. cues on ex form and technique.                    Exercises  Other Exercises  in parallel bars: forward step ups: airex<>8 inch box<>chair with pt stepping one foot airex, next foot to box and then tapping the chair with foot from airex, then reciprocally stepping back down to the floor. performed 10 reps each leg leading with light touch to bars for balance. on inverted BOSU in parallel bars: rocking fwd/bwd and laterally with emphasis on  tall posture and weight shifting, mini squats x 10 reps with cues on ex form/technique            PT Long Term Goals - 03/29/17 1534      PT LONG TERM GOAL #1   Title Pt will improve FGA score to 26/30 to reduce risk of falls   Baseline 22/30   Status New   Target Date 04/28/17     PT LONG TERM GOAL #2   Title Pt will improve LE strength to 4+/5 bilaterally throughout   Status New   Target Date 04/28/17     PT LONG TERM GOAL #3   Title Pt will improve ABC by 5 to 8%   Baseline 90%   Status New   Target Date 04/28/17     PT LONG TERM GOAL #4   Title Pt will be able to stand from kneeling with no UE support   Status New   Target Date 04/28/17      04/05/17 1538  Plan  Clinical Impression Statement Today's skilled session continued to address LE strengthening without any issues reported in session.  Pt is making steady progress and should benefit from continued PT to progress toward unmet goals.  Pt will benefit from skilled therapeutic intervention in order to improve on the following deficits Decreased balance;Decreased strength;Impaired sensation;Decreased activity tolerance;Decreased endurance  Rehab Potential Excellent  Clinical Impairments Affecting Rehab Potential chemo induced peripheral neuropathy  PT Frequency Other (comment) (2x week for 4 weeks then 1x week for 4 weeks)  PT Duration 8 weeks  PT Treatment/Interventions Gait training;Stair training;Functional mobility training;Therapeutic activities;Therapeutic exercise;Balance training;Neuromuscular  re-education;Patient/family education;Energy conservation;ADLs/Self Care Home Management  PT Next Visit Plan LE strengthening, practice standing from kneeling, balance and higher level gait activities (eg: tandem standing, head turns, compliant surfaces)  PT Home Exercise Plan Pt reports [facility] [employer] [facility]  Consulted and Agree with Plan of Care Patient      Patient will benefit from skilled therapeutic intervention in order to improve the following deficits and impairments:  Decreased balance, Decreased strength, Impaired sensation, Decreased activity tolerance, Decreased endurance  Visit Diagnosis: Unsteadiness on feet  Muscle weakness (generalized)  Difficulty in walking, not elsewhere classified     Problem List Patient Active Problem List   Diagnosis Date Noted  . Neuropathy due to chemotherapeutic drug (  [place]) 02/12/2017  . Port catheter in place 11/06/2016  . Peripheral neuropathy 09/11/2016  . Atypical pneumonia 06/18/2016  . Acute respiratory failure with hypoxia ([place]) 06/18/2016  . Fever 06/16/2016  . Lactic acidosis 06/16/2016  . Dehydration 06/16/2016  . Encounter for antineoplastic chemotherapy 04/17/2016  . NHL (non-Hodgkin's lymphoma) ([place]) 04/05/2016  . Diffuse large B-cell lymphoma of lymph nodes of multiple regions ([place]) 04/05/2016  . Lung mass 03/13/2016  . Post-operative state 03/08/2014  . Left inguinal hernia 05/08/2013    Willow Ora, PTA, [facility] [address] [place], [place] [address] [phone] 04/07/17, 8:48 PM   Name: LIRON EISSLER MRN: [id number] Date of Birth: 12/19/1935

## 2017-04-08 ENCOUNTER — Ambulatory Visit: Payer: Medicare Other | Admitting: Physical Therapy

## 2017-04-08 ENCOUNTER — Encounter: Payer: Self-pay | Admitting: Physical Therapy

## 2017-04-08 DIAGNOSIS — M21372 Foot drop, left foot: Secondary | ICD-10-CM

## 2017-04-08 DIAGNOSIS — R262 Difficulty in walking, not elsewhere classified: Secondary | ICD-10-CM

## 2017-04-08 DIAGNOSIS — R296 Repeated falls: Secondary | ICD-10-CM

## 2017-04-08 DIAGNOSIS — R2681 Unsteadiness on feet: Secondary | ICD-10-CM | POA: Diagnosis not present

## 2017-04-08 DIAGNOSIS — M21371 Foot drop, right foot: Secondary | ICD-10-CM

## 2017-04-08 DIAGNOSIS — M6281 Muscle weakness (generalized): Secondary | ICD-10-CM

## 2017-04-08 NOTE — Therapy (Signed)
[facility] [address] [place], [place], [address] Phone: [phone]   Fax:  [phone]  Physical Therapy Treatment  Patient Details  Name: Seth Gilbert MRN: [id number] Date of Birth: 03-25-1936 Referring Provider: Berta Minor B. Jaynee Eagles, MD  Encounter Date: 04/08/2017      PT End of Session - 04/08/17 1634    Visit Number 4   Number of Visits 13   Date for PT Re-Evaluation 26/83/41  8 week certification; reassess goals at 4 weeks   Authorization Type Blue Cross [facility] code and progress note 10th visit   PT Start Time 1534   PT Stop Time 1615   PT Time Calculation (min) 41 min   Activity Tolerance Patient tolerated treatment well   Behavior During Therapy WFL for tasks assessed/performed      Past Medical History:  Diagnosis Date  . Cancer ([place])    NHL  . Diverticulitis   . GERD (gastroesophageal reflux disease)    Heartburn at times  . Heart murmur    "comes and goes" onset 4th grade - last time anyone heard it was 01/2014  . History of hiatal hernia   . History of kidney stones   . HOH (hard of hearing)   . Non-Hodgkin lymphoma ([place])   . Peripheral neuropathy 09/11/2016  . PONV (postoperative nausea and vomiting)   . Rosacea   . Wears glasses     Past Surgical History:  Procedure Laterality Date  . APPENDECTOMY     2-3 yrs old  . COLONOSCOPY    . HEMORROIDECTOMY     inflammed anal gland removed  . INGUINAL HERNIA REPAIR Left 02/11/2014   Procedure: LEFT INGUINAL HERNIA REPAIR WITH MESH ;  Surgeon: Joyice Faster. Cornett, MD;  Location: [facility];  Service: General;  Laterality: Left;  . INSERTION OF MESH N/A 02/11/2014   Procedure: INSERTION OF MESH;  Surgeon: Joyice Faster. Cornett, MD;  Location: [facility];  Service: General;  Laterality: N/A;  . PORTACATH PLACEMENT Left 04/10/2016   Procedure: INSERTION PORT-A-CATH into LEFT Subclavian Vein with guidance from  ultrasound and flouroscopy;  Surgeon: Grace Isaac, MD;  Location: [facility];  Service: Thoracic;  Laterality: Left;  . TONSILLECTOMY    . VIDEO BRONCHOSCOPY WITH ENDOBRONCHIAL ULTRASOUND N/A 03/30/2016   Procedure: VIDEO BRONCHOSCOPY WITH ENDOBRONCHIAL ULTRASOUND,WITH TRANSBRONCHIAL BIOPSY;  Surgeon: Grace Isaac, MD;  Location: [facility];  Service: Thoracic;  Laterality: N/A;    There were no vitals filed for this visit.      Subjective Assessment - 04/08/17 1537    Subjective No falls; no soreness over the weekend.  Continues to do exercises diligently.   Patient is accompained by: Family member   Pertinent History lymphoma with chemo, HOH   Limitations Walking;Other (comment)   Patient Stated Goals Have sufficient strength and balance to stand from kneeling when working on [employer] and performing other household tasks   Currently in Pain? No/denies                         [facility] Adult PT Treatment/Exercise - 04/08/17 1538      Transfers   Transfers Floor to Transfer   Floor to Transfer 5: Supervision   Floor to Transfer Details (indicate cue type and reason) Broke down floor transfer into various components: x 10 reps alternating hip flexion to bring foot forward from tall kneeling > half kneeling; maintaining  half kneeling and balancing x 10 seconds without UE support and then transitioning from half kneeling to lunge x 2 reps each side with supervision-min A.       Knee/Hip Exercises: Standing   Forward Lunges Right;Left;2 sets;10 reps   Forward Lunges Limitations in // bars and then with front foot against wall and focus on dropping down and maintaining balance vs. shifting knee forwards over foot   Forward Step Up Right;Left;1 set;10 reps;Hand Hold: 1;Step Height: 8"   Forward Step Up Limitations with contralateral LE flexion to SLS with 1-2 second hold   Step Down Right;Left;1 set;10 reps;Hand Hold: 1;Step Height: 6"   Step Down Limitations tap down with  heel   Wall Squat 1 set;5 reps;10 seconds   Wall Squat Limitations stool underneath in case of weakness/fall                PT Education - 04/08/17 1633    Education provided Yes   Education Details strengthening exercises for home, technique for lunges   Person(s) Educated Patient   Methods Explanation;Demonstration;Handout   Comprehension Verbalized understanding;Returned demonstration          PT Short Term Goals - 12/19/16 1238      PT SHORT TERM GOAL #1   Title (TARGET DATE FOR STG 12/19/16)  Pt will participate in FGA with LTG to be set   Status Achieved     PT SHORT TERM GOAL #2   Title Pt will improve functional LE strength as indicated by decrease in 5x sit to stand score of <10 seconds   Baseline 12/19/16: 11.10 sec's today, improved from 12.32 sec's at eval   Status Partially Met     PT SHORT TERM GOAL #3   Title Pt will improve safety with gait as indicated by improvement in gait velocity to >3.5 ft/sec   Baseline 12/19/16: 3.24 ft/sec today, improved from 3.07 ft/sec at eval   Status Partially Met     PT SHORT TERM GOAL #4   Title Pt will negotiate 8 stairs, alternating sequence with no rail and supervision    Baseline 12/19/16: met today   Status Achieved     PT SHORT TERM GOAL #5   Title Pt will be able to maintain 30 seconds on all conditions on MCTSIB to indicate improved use of all sensory systems   Baseline 12/19/16: met today   Status Achieved           PT Long Term Goals - 03/29/17 1534      PT LONG TERM GOAL #1   Title Pt will improve FGA score to 26/30 to reduce risk of falls   Baseline 22/30   Status New   Target Date 04/28/17     PT LONG TERM GOAL #2   Title Pt will improve LE strength to 4+/5 bilaterally throughout   Status New   Target Date 04/28/17     PT LONG TERM GOAL #3   Title Pt will improve ABC by 5 to 8%   Baseline 90%   Status New   Target Date 04/28/17     PT LONG TERM GOAL #4   Title Pt will be able to stand  from kneeling with no UE support   Status New   Target Date 04/28/17               Plan - 04/08/17 1634    Clinical Impression Statement Continued higher level strength and balance training to facilitate safer transitions  stand <> floor.  Pt demonstrated decreased strength and impaired technique with lunges-focused on proper technique keeing knee aligned with ankle and carryover of technique when transitioning from half kneeling > lunge > stand.  Will continue to address and progress towards LTG.   Rehab Potential Excellent   Clinical Impairments Affecting Rehab Potential chemo induced peripheral neuropathy   PT Frequency Other (comment)  2x week for 4 weeks then 1x week for 4 weeks   PT Duration 8 weeks   PT Treatment/Interventions Gait training;Stair training;Functional mobility training;Therapeutic activities;Therapeutic exercise;Balance training;Neuromuscular re-education;Patient/family education;Energy conservation;ADLs/Self Care Home Management   PT Next Visit Plan LE strengthening, practice standing from kneeling, balance and higher level gait activities (eg: tandem standing, head turns, compliant surfaces), lunges   PT Home Exercise Plan Pt reports will [facility] [employer] [facility] of [facility]   Consulted and Agree with Plan of Care Patient      Patient will benefit from skilled therapeutic intervention in order to improve the following deficits and impairments:  Decreased balance, Decreased strength, Impaired sensation, Decreased activity tolerance, Decreased endurance  Visit Diagnosis: Muscle weakness (generalized)  Repeated falls  Foot drop, left  Foot drop, right  Difficulty in walking, not elsewhere classified     Problem List Patient Active Problem List   Diagnosis Date Noted  . Neuropathy due to chemotherapeutic drug ([place]) 02/12/2017  . Port catheter in place 11/06/2016  . Peripheral neuropathy 09/11/2016  . Atypical pneumonia 06/18/2016  .  Acute respiratory failure with hypoxia ([place]) 06/18/2016  . Fever 06/16/2016  . Lactic acidosis 06/16/2016  . Dehydration 06/16/2016  . Encounter for antineoplastic chemotherapy 04/17/2016  . NHL (non-Hodgkin's lymphoma) ([place]) 04/05/2016  . Diffuse large B-cell lymphoma of lymph nodes of multiple regions ([place]) 04/05/2016  . Lung mass 03/13/2016  . Post-operative state 03/08/2014  . Left inguinal hernia 05/08/2013    Raylene Everts, PT, DPT 04/08/17    5:29 PM    [facility] [address] [place], [place], [address] Phone: [phone]   Fax:  [phone]  Name: TIRAS BIANCHINI MRN: [id number] Date of Birth: 23-Mar-1936

## 2017-04-12 ENCOUNTER — Encounter: Payer: Self-pay | Admitting: Rehabilitation

## 2017-04-12 ENCOUNTER — Ambulatory Visit: Payer: Medicare Other | Admitting: Rehabilitation

## 2017-04-12 DIAGNOSIS — R2681 Unsteadiness on feet: Secondary | ICD-10-CM | POA: Diagnosis not present

## 2017-04-12 DIAGNOSIS — R296 Repeated falls: Secondary | ICD-10-CM

## 2017-04-12 DIAGNOSIS — M6281 Muscle weakness (generalized): Secondary | ICD-10-CM

## 2017-04-13 NOTE — Therapy (Signed)
[facility] [address] [place], [place], [address] Phone: [phone]   Fax:  [phone]  Physical Therapy Treatment  Patient Details  Name: Seth Gilbert MRN: [id number] Date of Birth: [birth year] Referring Provider: Berta Minor B. Jaynee Eagles, MD  Encounter Date: 04/12/2017      PT End of Session - 04/13/17 2037    Visit Number 5   Number of Visits 13   Date for PT Re-Evaluation 86/76/72  8 week certification; reassess goals at 4 weeks   Authorization Type Blue Cross [facility] code and progress note 10th visit   PT Start Time 1616   PT Stop Time 1700   PT Time Calculation (min) 44 min   Activity Tolerance Patient tolerated treatment well   Behavior During Therapy [facility] for tasks assessed/performed      Past Medical History:  Diagnosis Date  . Cancer ([place])    NHL  . Diverticulitis   . GERD (gastroesophageal reflux disease)    Heartburn at times  . Heart murmur    "comes and goes" onset 4th grade - last time anyone heard it was 01/2014  . History of hiatal hernia   . History of kidney stones   . HOH (hard of hearing)   . Non-Hodgkin lymphoma ([place])   . Peripheral neuropathy 09/11/2016  . PONV (postoperative nausea and vomiting)   . Rosacea   . Wears glasses     Past Surgical History:  Procedure Laterality Date  . APPENDECTOMY     2-3 yrs old  . COLONOSCOPY    . HEMORROIDECTOMY     inflammed anal gland removed  . INGUINAL HERNIA REPAIR Left 02/11/2014   Procedure: LEFT INGUINAL HERNIA REPAIR WITH MESH ;  Surgeon: Joyice Faster. Cornett, MD;  Location: [place];  Service: General;  Laterality: Left;  . INSERTION OF MESH N/A 02/11/2014   Procedure: INSERTION OF MESH;  Surgeon: Joyice Faster. Cornett, MD;  Location: [place];  Service: General;  Laterality: N/A;  . PORTACATH PLACEMENT Left 04/10/2016   Procedure: INSERTION PORT-A-CATH into LEFT Subclavian Vein with guidance from  ultrasound and flouroscopy;  Surgeon: Grace Isaac, MD;  Location: [place];  Service: Thoracic;  Laterality: Left;  . TONSILLECTOMY    . VIDEO BRONCHOSCOPY WITH ENDOBRONCHIAL ULTRASOUND N/A 03/30/2016   Procedure: VIDEO BRONCHOSCOPY WITH ENDOBRONCHIAL ULTRASOUND,WITH TRANSBRONCHIAL BIOPSY;  Surgeon: Grace Isaac, MD;  Location: [place];  Service: Thoracic;  Laterality: N/A;    There were no vitals filed for this visit.      Subjective Assessment - 04/12/17 1616    Subjective Nothing new to report.  No falls.     Patient is accompained by: Family member   Pertinent History lymphoma with chemo, HOH   Limitations Walking;Other (comment)   Patient Stated Goals Have sufficient strength and balance to stand from kneeling when working on [employer] and performing other household tasks   Currently in Pain? No/denies                         [facility] Adult PT Treatment/Exercise - 04/13/17 0001      Neuro Re-ed    Neuro Re-ed Details  Worked on several types of tandem stance while standing on blue therapy mat while on ramp surface to challenge balance.   Had pt perform staggered stance with EO x 2 sets of 30 secs on each side.  Intermittent min A to prevent  overt LOB.  Progressed to modified tandem stance (heel of foot to forefoot of opposite foot) x 2 sets of 20 secs.  Again, note marked difficulty, therefore had pt perform in corner on pillows to assess difficulty and add to HEP for home.  Pt with increased difficulty with full tandem stance on pillow therefore had him perform in modified tandem x 20 secs on each side.  Added this to HEP as it was moderately difficult for pt.      Exercises   Other Exercises  Continued to work on proper technique with forward lunges.  Note improvement today once pt could get back leg far back enough.   Pt first demonstrating how he was performing at home then with PT providing cues on either facing or having surface beside of him so that he could  have support but also get back leg further back.  Cues for posture and dropping back knee to the ground.  Provided target for pts knee to improve technique.  Performed x 10 reps both with forward facing support and lateral support with lateral support proving best technique.  Feel that pt limited with technique due to hip tightness, therefore demo'd and performed runner's stretch x 2 mins on each side (with tactile and verbal cues for technique) and also standing side lunge for hip adductor stretch x 2 sets of 20 secs on each side.  Provided support as needed due to balance difficulties.                  PT Education - 04/13/17 2036    Education provided Yes   Education Details modifications to HEP, technique for lunges   Person(s) Educated Patient   Methods Explanation   Comprehension Verbalized understanding          PT Short Term Goals - 12/19/16 1238      PT SHORT TERM GOAL #1   Title (TARGET DATE FOR STG 12/19/16)  Pt will participate in FGA with LTG to be set   Status Achieved     PT SHORT TERM GOAL #2   Title Pt will improve functional LE strength as indicated by decrease in 5x sit to stand score of <10 seconds   Baseline 12/19/16: 11.10 sec's today, improved from 12.32 sec's at eval   Status Partially Met     PT SHORT TERM GOAL #3   Title Pt will improve safety with gait as indicated by improvement in gait velocity to >3.5 ft/sec   Baseline 12/19/16: 3.24 ft/sec today, improved from 3.07 ft/sec at eval   Status Partially Met     PT SHORT TERM GOAL #4   Title Pt will negotiate 8 stairs, alternating sequence with no rail and supervision    Baseline 12/19/16: met today   Status Achieved     PT SHORT TERM GOAL #5   Title Pt will be able to maintain 30 seconds on all conditions on MCTSIB to indicate improved use of all sensory systems   Baseline 12/19/16: met today   Status Achieved           PT Long Term Goals - 03/29/17 1534      PT LONG TERM GOAL #1   Title  Pt will improve FGA score to 26/30 to reduce risk of falls   Baseline 22/30   Status New   Target Date 04/28/17     PT LONG TERM GOAL #2   Title Pt will improve LE strength to 4+/5 bilaterally throughout  Status New   Target Date 04/28/17     PT LONG TERM GOAL #3   Title Pt will improve ABC by 5 to 8%   Baseline 90%   Status New   Target Date 04/28/17     PT LONG TERM GOAL #4   Title Pt will be able to stand from kneeling with no UE support   Status New   Target Date 04/28/17               Plan - 04/13/17 2037    Clinical Impression Statement Skilled session continues to focus on correct lunge technique with BLE flexibility/stretching for improved technique.  Also continue to addressed high level balance with narrow BOS to prepare for floor to transfers for leisure activities.     Rehab Potential Excellent   Clinical Impairments Affecting Rehab Potential chemo induced peripheral neuropathy   PT Frequency Other (comment)  2x week for 4 weeks then 1x week for 4 weeks   PT Duration 8 weeks   PT Treatment/Interventions Gait training;Stair training;Functional mobility training;Therapeutic activities;Therapeutic exercise;Balance training;Neuromuscular re-education;Patient/family education;Energy conservation;ADLs/Self Care Home Management   PT Next Visit Plan LE strengthening, practice standing from kneeling, balance and higher level gait activities (eg: tandem standing, head turns, compliant surfaces), lunges   PT Home Exercise Plan Pt reports will [facility] [employer] [facility] [employer] [facility] of August   Consulted and Agree with Plan of Care Patient      Patient will benefit from skilled therapeutic intervention in order to improve the following deficits and impairments:  Decreased balance, Decreased strength, Impaired sensation, Decreased activity tolerance, Decreased endurance  Visit Diagnosis: Muscle weakness (generalized)  Repeated falls  Unsteadiness on  feet     Problem List Patient Active Problem List   Diagnosis Date Noted  . Neuropathy due to chemotherapeutic drug ([place]) 02/12/2017  . Port catheter in place 11/06/2016  . Peripheral neuropathy 09/11/2016  . Atypical pneumonia 06/18/2016  . Acute respiratory failure with hypoxia ([place]) 06/18/2016  . Fever 06/16/2016  . Lactic acidosis 06/16/2016  . Dehydration 06/16/2016  . Encounter for antineoplastic chemotherapy 04/17/2016  . NHL (non-Hodgkin's lymphoma) ([place]) 04/05/2016  . Diffuse large B-cell lymphoma of lymph nodes of multiple regions ([place]) 04/05/2016  . Lung mass 03/13/2016  . Post-operative state 03/08/2014  . Left inguinal hernia 05/08/2013    Cameron Sprang, PT, MPT [facility] [address] [place], [place], [address] Phone: [phone]   Fax:  [phone] 04/13/17, 8:40 PM  Name: XABI WITTLER MRN: [id number] Date of Birth: [birth year]

## 2017-04-15 ENCOUNTER — Ambulatory Visit: Payer: Medicare Other | Admitting: Physical Therapy

## 2017-04-17 ENCOUNTER — Encounter: Payer: Self-pay | Admitting: Physical Therapy

## 2017-04-17 ENCOUNTER — Ambulatory Visit: Payer: Medicare Other | Admitting: Physical Therapy

## 2017-04-17 DIAGNOSIS — R296 Repeated falls: Secondary | ICD-10-CM

## 2017-04-17 DIAGNOSIS — M6281 Muscle weakness (generalized): Secondary | ICD-10-CM

## 2017-04-17 DIAGNOSIS — R2681 Unsteadiness on feet: Secondary | ICD-10-CM | POA: Diagnosis not present

## 2017-04-18 NOTE — Therapy (Signed)
[facility] [address] [place], [place], [address] Phone: [phone]   Fax:  [phone]  Physical Therapy Treatment  Patient Details  Name: Seth Gilbert MRN: [id number] Date of Birth: 1935-09-01 Referring Provider: Berta Minor B. Jaynee Eagles, MD  Encounter Date: 04/17/2017      PT End of Session - 04/18/17 1424    Visit Number 6   Number of Visits 13   Date for PT Re-Evaluation 88/79/66  8 week certification; reassess goals at 4 weeks   Authorization Type Blue Cross [facility] code and progress note 10th visit   PT Start Time 1450   PT Stop Time 1532   PT Time Calculation (min) 42 min   Activity Tolerance Patient tolerated treatment well   Behavior During Therapy [facility] for tasks assessed/performed      Past Medical History:  Diagnosis Date  . Cancer ([place])    NHL  . Diverticulitis   . GERD (gastroesophageal reflux disease)    Heartburn at times  . Heart murmur    "comes and goes" onset 4th grade - last time anyone heard it was 01/2014  . History of hiatal hernia   . History of kidney stones   . HOH (hard of hearing)   . Non-Hodgkin lymphoma ([place])   . Peripheral neuropathy 09/11/2016  . PONV (postoperative nausea and vomiting)   . Rosacea   . Wears glasses     Past Surgical History:  Procedure Laterality Date  . APPENDECTOMY     2-3 yrs old  . COLONOSCOPY    . HEMORROIDECTOMY     inflammed anal gland removed  . INGUINAL HERNIA REPAIR Left 02/11/2014   Procedure: LEFT INGUINAL HERNIA REPAIR WITH MESH ;  Surgeon: Joyice Faster. Cornett, MD;  Location: [place];  Service: General;  Laterality: Left;  . INSERTION OF MESH N/A 02/11/2014   Procedure: INSERTION OF MESH;  Surgeon: Joyice Faster. Cornett, MD;  Location: [place];  Service: General;  Laterality: N/A;  . PORTACATH PLACEMENT Left 04/10/2016   Procedure: INSERTION PORT-A-CATH into LEFT Subclavian Vein with guidance from  ultrasound and flouroscopy;  Surgeon: Grace Isaac, MD;  Location: [place];  Service: Thoracic;  Laterality: Left;  . TONSILLECTOMY    . VIDEO BRONCHOSCOPY WITH ENDOBRONCHIAL ULTRASOUND N/A 03/30/2016   Procedure: VIDEO BRONCHOSCOPY WITH ENDOBRONCHIAL ULTRASOUND,WITH TRANSBRONCHIAL BIOPSY;  Surgeon: Grace Isaac, MD;  Location: [place];  Service: Thoracic;  Laterality: N/A;    There were no vitals filed for this visit.      Subjective Assessment - 04/17/17 1507    Subjective No falls, pt with questions about new exercises for HEP   Pertinent History lymphoma with chemo, HOH   Limitations Walking;Other (comment)   Patient Stated Goals Have sufficient strength and balance to stand from kneeling when working on [employer] and performing other household tasks   Currently in Pain? No/denies                         [facility] Adult PT Treatment/Exercise - 04/17/17 1516      Neuro Re-ed    Neuro Re-ed Details  Transition from tall kneeling to 1/2 kneel on each side without UE support and maintain balance with knee over foot x 6 seconds, 5 reps each side.  Progressed to 1/2 kneeling to lunge by lifting back knee and holding x 5-6 seconds x 2 reps each LE with one UE  support on mat.     Knee/Hip Exercises: Stretches   Hip Flexor Stretch Right;Left;2 reps;30 seconds   Hip Flexor Stretch Limitations in tall kneeling-runner's stretch             Balance Exercises - 04/18/17 1353      Balance Exercises: Standing   Balance Beam tandem gait on wooden balance beam x 4 reps with focus on increasing upward gaze and decreasing UE assistance.  Also performed stance/balance training placing each foot on either side of beam as far forwards and back as possible and maintaining balance without UE support to prepare for lunges.  Pt continues to report tightness in hip flexors and gastroc mm.             PT Education - 04/18/17 1423    Education provided Yes   Education Details  hip flexor stretch, modifications to HEP   Person(s) Educated Patient   Methods Explanation   Comprehension Verbalized understanding;Returned demonstration          PT Short Term Goals - 04/18/17 1430      PT SHORT TERM GOAL #1   Title = LTG           PT Long Term Goals - 03/29/17 1534      PT LONG TERM GOAL #1   Title Pt will improve FGA score to 26/30 to reduce risk of falls   Baseline 22/30   Status New   Target Date 04/28/17     PT LONG TERM GOAL #2   Title Pt will improve LE strength to 4+/5 bilaterally throughout   Status New   Target Date 04/28/17     PT LONG TERM GOAL #3   Title Pt will improve ABC by 5 to 8%   Baseline 90%   Status New   Target Date 04/28/17     PT LONG TERM GOAL #4   Title Pt will be able to stand from kneeling with no UE support   Status New   Target Date 04/28/17               Plan - 04/18/17 1424    Clinical Impression Statement Continued to focus on review and modification of HEP as well as dynamic LE stretching, strengthening and balance for increased independence, improved technique and safety for floor <> stand transfers.  Pt tolerated well and will benefit from continued therapy to address balance, ROM and strength deficits.   Rehab Potential Excellent   Clinical Impairments Affecting Rehab Potential chemo induced peripheral neuropathy   PT Frequency Other (comment)  2x week for 4 weeks then 1x week for 4 weeks   PT Duration 8 weeks   PT Treatment/Interventions Gait training;Stair training;Functional mobility training;Therapeutic activities;Therapeutic exercise;Balance training;Neuromuscular re-education;Patient/family education;Energy conservation;ADLs/Self Care Home Management   PT Next Visit Plan LE strengthening, practice standing from kneeling, balance and higher level gait activities (eg: tandem standing, head turns, compliant surfaces), balance beam, lunges   PT Home Exercise Plan Pt reports will be joining  [employer] [facility] [employer] at the end of August   Consulted and Agree with Plan of Care Patient      Patient will benefit from skilled therapeutic intervention in order to improve the following deficits and impairments:  Decreased balance, Decreased strength, Impaired sensation, Decreased activity tolerance, Decreased endurance  Visit Diagnosis: Muscle weakness (generalized)  Repeated falls     Problem List Patient Active Problem List   Diagnosis Date Noted  . Neuropathy due to chemotherapeutic  drug ([place]) 02/12/2017  . Port catheter in place 11/06/2016  . Peripheral neuropathy 09/11/2016  . Atypical pneumonia 06/18/2016  . Acute respiratory failure with hypoxia ([place]) 06/18/2016  . Fever 06/16/2016  . Lactic acidosis 06/16/2016  . Dehydration 06/16/2016  . Encounter for antineoplastic chemotherapy 04/17/2016  . NHL (non-Hodgkin's lymphoma) ([place]) 04/05/2016  . Diffuse large B-cell lymphoma of lymph nodes of multiple regions ([place]) 04/05/2016  . Lung mass 03/13/2016  . Post-operative state 03/08/2014  . Left inguinal hernia 05/08/2013    Raylene Everts, PT, DPT 04/18/17    2:31 PM    [facility] [address] [place], [place], [address] Phone: [phone]   Fax:  [phone]  Name: KYLLE LALL MRN: [id number] Date of Birth: [birth year]

## 2017-04-19 ENCOUNTER — Encounter: Payer: Self-pay | Admitting: Physical Therapy

## 2017-04-19 ENCOUNTER — Ambulatory Visit: Payer: Medicare Other | Admitting: Physical Therapy

## 2017-04-19 DIAGNOSIS — M6281 Muscle weakness (generalized): Secondary | ICD-10-CM

## 2017-04-19 DIAGNOSIS — R2681 Unsteadiness on feet: Secondary | ICD-10-CM | POA: Diagnosis not present

## 2017-04-19 DIAGNOSIS — R296 Repeated falls: Secondary | ICD-10-CM

## 2017-04-20 IMAGING — CT CT ANGIO CHEST
2 of 7 series · 18 of 46 positions shown · IV contrast (APPLIED)
Comparison: 03/12/2016

CLINICAL DATA: Shortness of breath and fevers

EXAM:
CT ANGIOGRAPHY CHEST WITH CONTRAST
TECHNIQUE: Multidetector CT imaging of the chest was performed using the
standard protocol during bolus administration of intravenous
contrast. Multiplanar CT image reconstructions and MIPs were
obtained to evaluate the vascular anatomy.
CONTRAST:  100 mL Isovue 370.

[Series 5: thins · axial · 0.73mm/px · z∈[-248,-16]mm · 16 of 262 slices shown]
[im 15/262  lung]
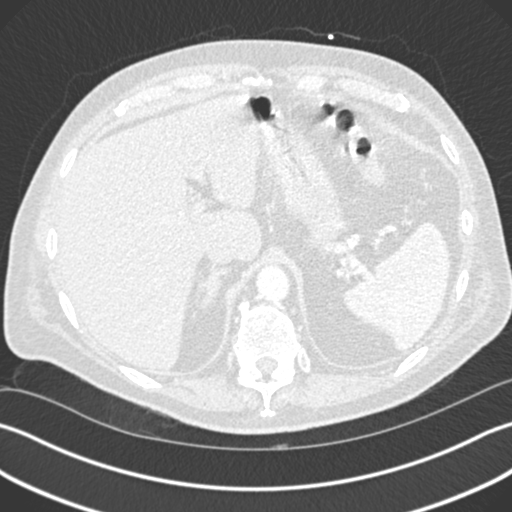
[im 30/262  soft-tissue]
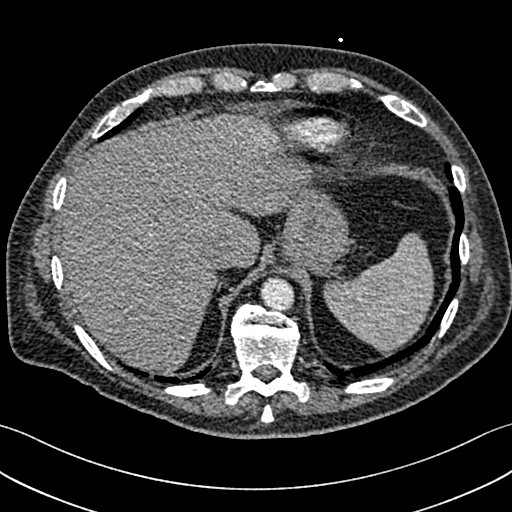
[im 44/262  lung]
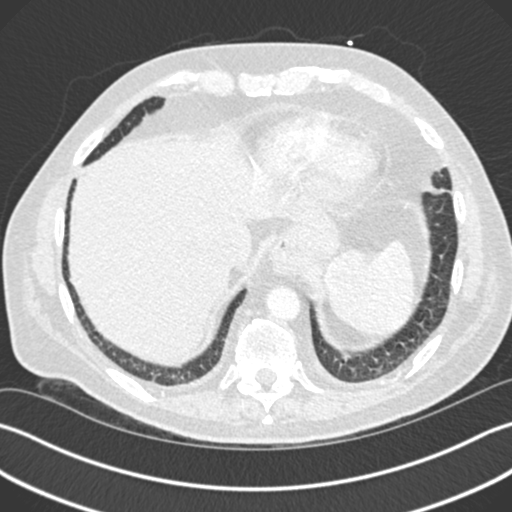
[im 59/262  soft-tissue]
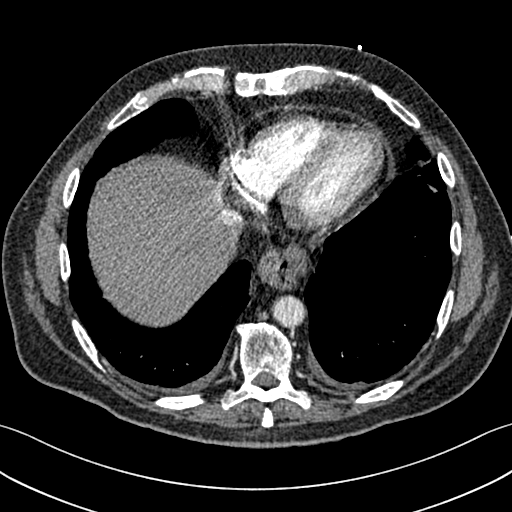
[im 73/262  lung]
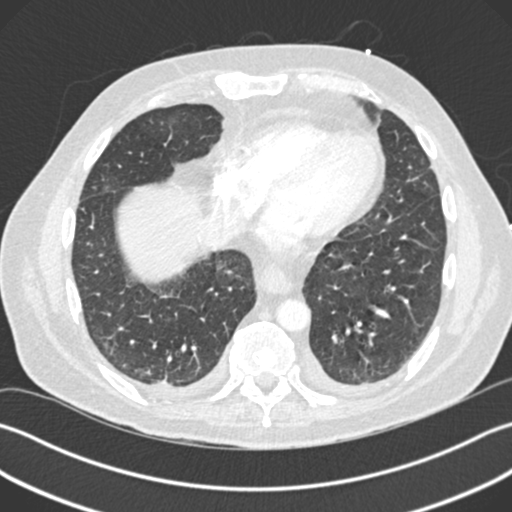
[im 88/262  soft-tissue]
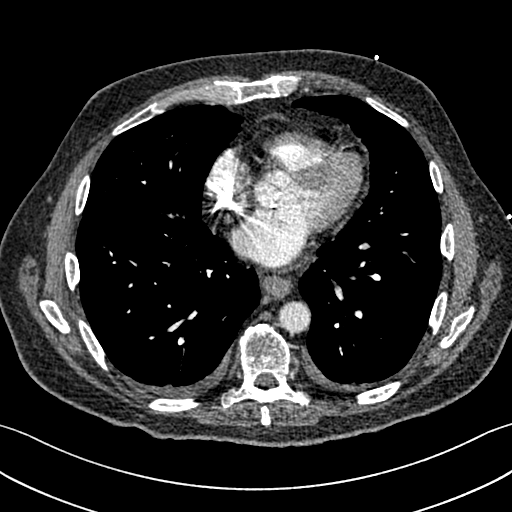
[im 102/262  lung]
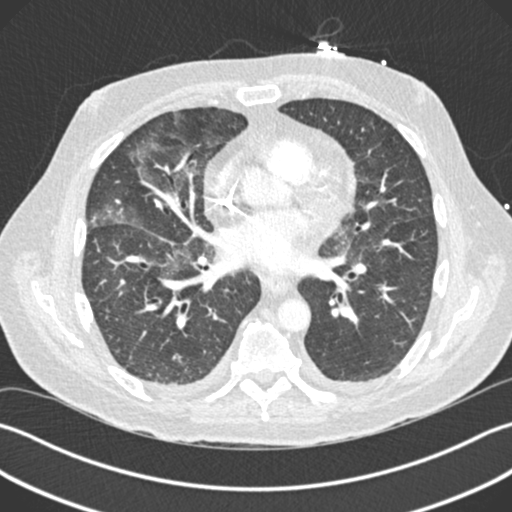
[im 117/262  soft-tissue]
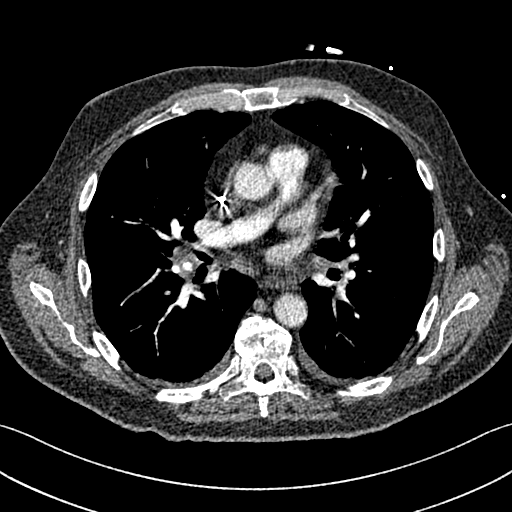
[im 146/262  lung]
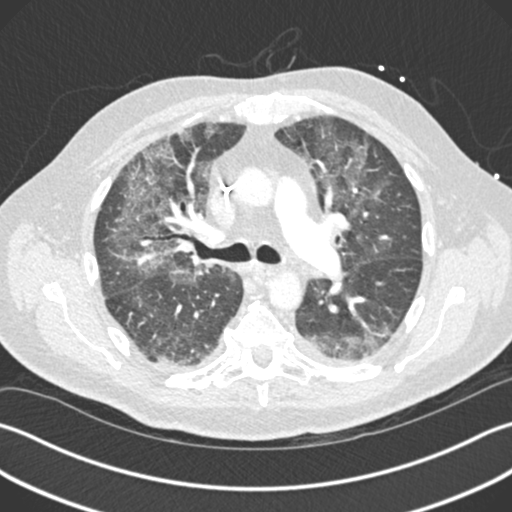
[im 160/262  soft-tissue]
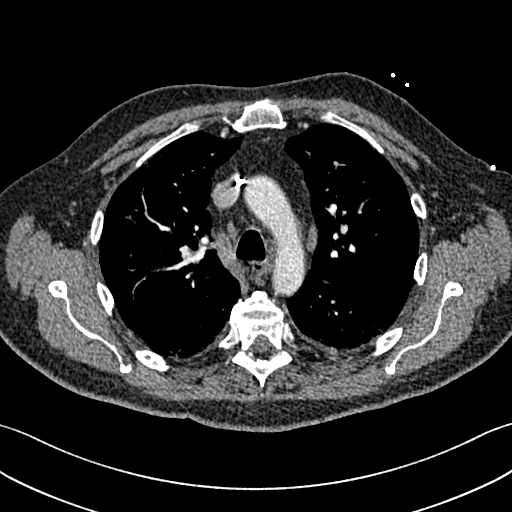
[im 175/262  lung]
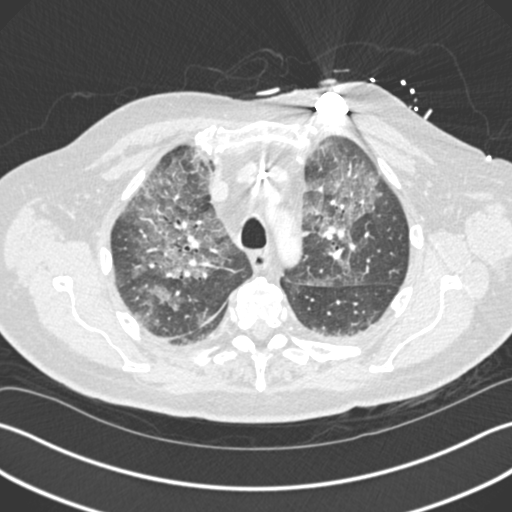
[im 189/262  soft-tissue]
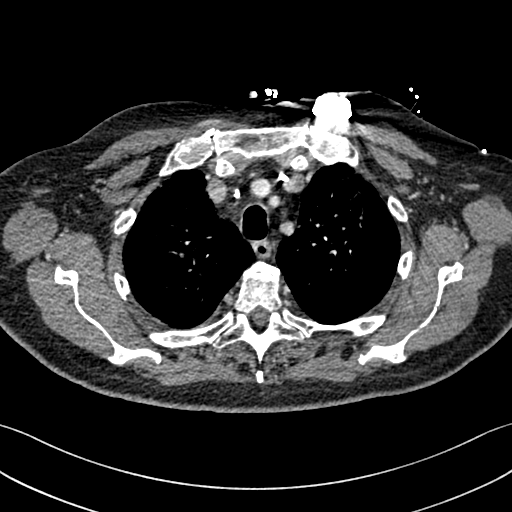
[im 204/262  lung]
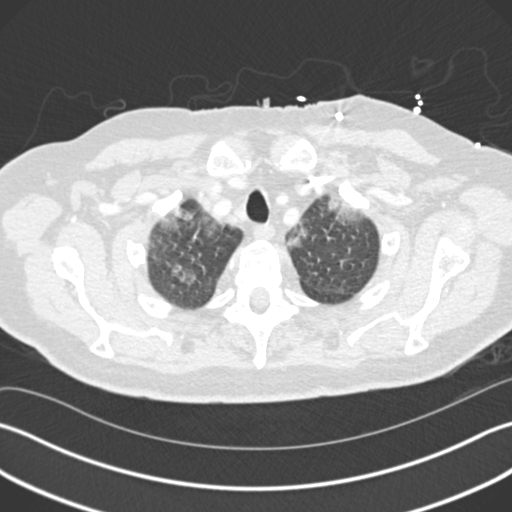
[im 218/262  soft-tissue]
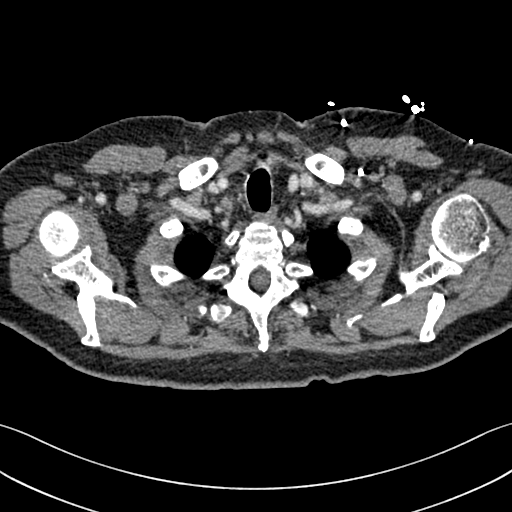
[im 233/262  lung]
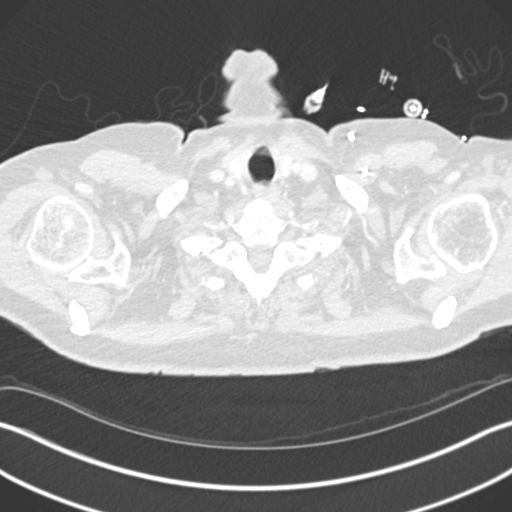
[im 247/262  soft-tissue]
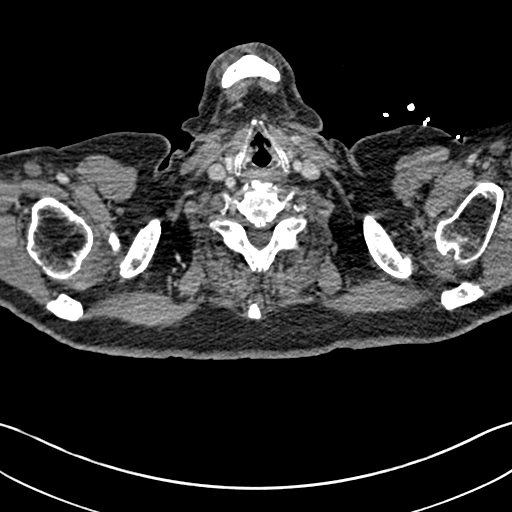

[Series 7: coronal mpr · coronal · 0.57mm/px · 2 of 106 slices shown]
[im 36/106  soft-tissue]
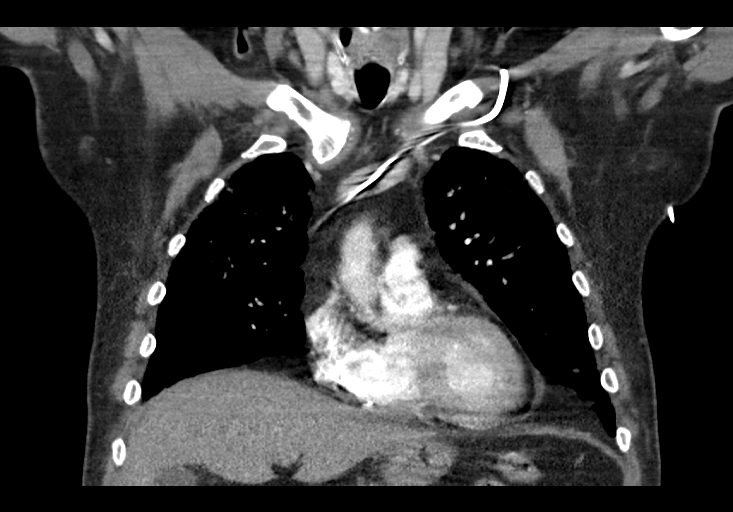
[im 71/106  soft-tissue]
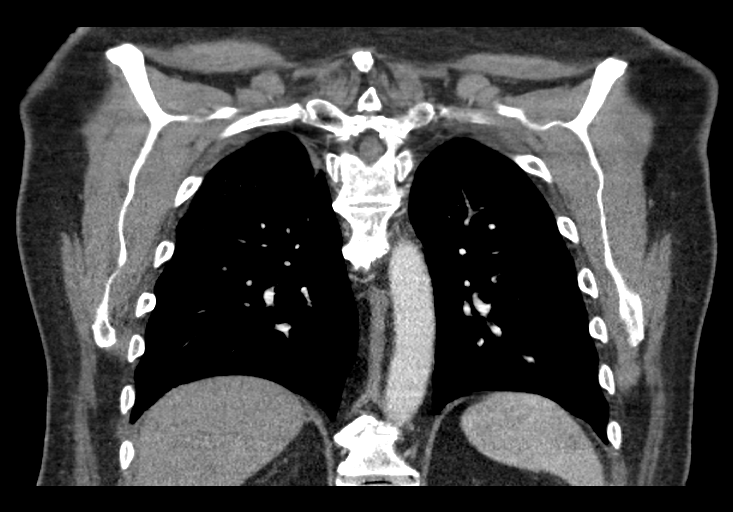

[18 of 46 positions shown; findings below may reference images not displayed]

FINDINGS: Cardiovascular: Thoracic aorta is within normal limits without
significant atherosclerotic calcification or aneurysmal dilatation.
Coronary calcifications are seen. The pulmonary artery is well
visualized and demonstrates a normal branching pattern. No filling
defects to suggest pulmonary emboli are identified.

Mediastinum/Nodes: The thoracic inlet is within normal limits. No
significant mediastinal adenopathy is noted. Small bilateral hilar
lymph nodes are seen. These are roughly similar to that noted on the
prior exam but evaluation is somewhat difficult due to lack of IV
contrast. Previously seen subcarinal adenopathy has resolved as has
the left infrahilar mass lesion. No axillary adenopathy is noted.

Lungs/Pleura: Lungs are well aerated bilaterally and demonstrate a
patchy ground-glass infiltrate consistent with acute pneumonia. The
nodular density noted in the left lower lobe along the fissure on
previous exam as likely related to underlying treatment. Tiny
bilateral pleural effusions are noted. Stable small nodule along the
major fissure on the right unchanged from prior exam. Lingular
density seen previously are no longer present.

Upper Abdomen: Within normal limits.

Musculoskeletal: Degenerative change of the thoracic spine is noted.

Review of the MIP images confirms the above findings.
IMPRESSION: Resolution of previously seen nodular densities within the lingula
and left lower lobe but. The known left infrahilar mass lesion has
resolved as well consistent with treatment. Stable small nodule
along the major fissure on the right is seen.

Bilateral ground-glass infiltrates consistent with atypical
pneumonia. Some small lymph nodes are noted within the hila likely
reactive in nature.

## 2017-04-20 NOTE — Therapy (Signed)
[facility] [address] [place], [place], [address] Phone: [phone]   Fax:  [phone]  Physical Therapy Treatment  Patient Details  Name: Seth Gilbert MRN: [id number] Date of Birth: 12-Jan-1936 Referring Provider: Berta Minor B. Jaynee Eagles, MD  Encounter Date: 04/19/2017      PT End of Session - 04/19/17 1454    Visit Number 7   Number of Visits 13   Date for PT Re-Evaluation 67/89/38  8 week certification; reassess goals at 4 weeks   Authorization Type [employer] Cross [facility] code and progress note 10th visit   PT Start Time 1449   PT Stop Time 1532   PT Time Calculation (min) 43 min   Equipment Utilized During Treatment Gait belt   Activity Tolerance Patient tolerated treatment well   Behavior During Therapy WFL for tasks assessed/performed      Past Medical History:  Diagnosis Date  . Cancer ([place])    NHL  . Diverticulitis   . GERD (gastroesophageal reflux disease)    Heartburn at times  . Heart murmur    "comes and goes" onset 4th grade - last time anyone heard it was 01/2014  . History of hiatal hernia   . History of kidney stones   . HOH (hard of hearing)   . Non-Hodgkin lymphoma ([place])   . Peripheral neuropathy 09/11/2016  . PONV (postoperative nausea and vomiting)   . Rosacea   . Wears glasses     Past Surgical History:  Procedure Laterality Date  . APPENDECTOMY     2-3 yrs old  . COLONOSCOPY    . HEMORROIDECTOMY     inflammed anal gland removed  . INGUINAL HERNIA REPAIR Left 02/11/2014   Procedure: LEFT INGUINAL HERNIA REPAIR WITH MESH ;  Surgeon: Joyice Faster. Cornett, MD;  Location: [facility];  Service: General;  Laterality: Left;  . INSERTION OF MESH N/A 02/11/2014   Procedure: INSERTION OF MESH;  Surgeon: Joyice Faster. Cornett, MD;  Location: [facility];  Service: General;  Laterality: N/A;  . PORTACATH PLACEMENT Left 04/10/2016   Procedure: INSERTION  PORT-A-CATH into LEFT Subclavian Vein with guidance from ultrasound and flouroscopy;  Surgeon: Grace Isaac, MD;  Location: [facility];  Service: Thoracic;  Laterality: Left;  . TONSILLECTOMY    . VIDEO BRONCHOSCOPY WITH ENDOBRONCHIAL ULTRASOUND N/A 03/30/2016   Procedure: VIDEO BRONCHOSCOPY WITH ENDOBRONCHIAL ULTRASOUND,WITH TRANSBRONCHIAL BIOPSY;  Surgeon: Grace Isaac, MD;  Location: [facility];  Service: Thoracic;  Laterality: N/A;    There were no vitals filed for this visit.      Subjective Assessment - 04/19/17 1452    Subjective No new complaints. No falls or pain to report. Has new exercises "down pat".   Patient is accompained by: Family member   Pertinent History lymphoma with chemo, HOH   Limitations Walking;Other (comment)   Patient Stated Goals Have sufficient strength and balance to stand from kneeling when working on [employer] and performing other household tasks   Currently in Pain? No/denies   Pain Score 0-No pain            [facility] Adult PT Treatment/Exercise - 04/19/17 1511      Exercises   Other Exercises  at counter top facing it in staggered stance: walking lunges along long counter top with multimodal cues on form/technique x 6 laps with intermittent UE support for balance;  lunges with back knee touching purlple ball and back up x 10  each side; at wall with small rubber blue ball under pt: wall squats x 3 to ball with min assist to stand, PTA placed knee just above ball as target for remaining reps stopping at 10 reps with pt able to return to standing without assistance.               Balance Exercises - 04/19/17 1501      Balance Exercises: Standing   Balance Beam blue balance beam next to coutner: side stepping left<>right x 6 laps each way with intermittent UE support on counter, then tandem walking fwd/bwd x 6 laps each way using mirror feedback to assist with foot placement on beam x 6 laps with single UE support on counter. cues needed for posture  and ex form/technique with min guard to min assist for balance.  wooden beam next to counter: side stepping left<>right with heels hanging off edge of beam x 5-6 laps each way, then with toes hanging off edge x 5-6 laps each. cues on ex form/technique with min guard assist to min assist for balance. fwd tandem stepping with heel taps to floor between steps x 8 laps with min gaurd assist and cues. single to bil UE support on counter for balance with these ex's .                            PT Short Term Goals - 04/18/17 1430      PT SHORT TERM GOAL #1   Title = LTG           PT Long Term Goals - 03/29/17 1534      PT LONG TERM GOAL #1   Title Pt will improve FGA score to 26/30 to reduce risk of falls   Baseline 22/30   Status New   Target Date 04/28/17     PT LONG TERM GOAL #2   Title Pt will improve LE strength to 4+/5 bilaterally throughout   Status New   Target Date 04/28/17     PT LONG TERM GOAL #3   Title Pt will improve ABC by 5 to 8%   Baseline 90%   Status New   Target Date 04/28/17     PT LONG TERM GOAL #4   Title Pt will be able to stand from kneeling with no UE support   Status New   Target Date 04/28/17            Plan - 04/19/17 1454    Clinical Impression Statement Today's skilled session continued to focus on balance and LE/ankle strengthening without any issues reported. Pt is progressing toward goals and should benefit from continued PT to progress toward unmet goals   Rehab Potential Excellent   Clinical Impairments Affecting Rehab Potential chemo induced peripheral neuropathy   PT Frequency Other (comment)  2x week for 4 weeks then 1x week for 4 weeks   PT Duration 8 weeks   PT Treatment/Interventions Gait training;Stair training;Functional mobility training;Therapeutic activities;Therapeutic exercise;Balance training;Neuromuscular re-education;Patient/family education;Energy conservation;ADLs/Self Care Home Management   PT Next Visit Plan LE  strengthening, practice standing from kneeling, balance and higher level gait activities (eg: tandem standing, head turns, compliant surfaces), balance beam, lunges   PT Home Exercise Plan Pt reports [facility] [employer] [facility]   Consulted and Agree with Plan of Care Patient      Patient will benefit from skilled therapeutic intervention in order  to improve the following deficits and impairments:  Decreased balance, Decreased strength, Impaired sensation, Decreased activity tolerance, Decreased endurance  Visit Diagnosis: Muscle weakness (generalized)  Repeated falls  Unsteadiness on feet     Problem List Patient Active Problem List   Diagnosis Date Noted  . Neuropathy due to chemotherapeutic drug ([place]) 02/12/2017  . Port catheter in place 11/06/2016  . Peripheral neuropathy 09/11/2016  . Atypical pneumonia 06/18/2016  . Acute respiratory failure with hypoxia ([place]) 06/18/2016  . Fever 06/16/2016  . Lactic acidosis 06/16/2016  . Dehydration 06/16/2016  . Encounter for antineoplastic chemotherapy 04/17/2016  . NHL (non-Hodgkin's lymphoma) ([place]) 04/05/2016  . Diffuse large B-cell lymphoma of lymph nodes of multiple regions ([place]) 04/05/2016  . Lung mass 03/13/2016  . Post-operative state 03/08/2014  . Left inguinal hernia 05/08/2013    Willow Ora, PTA, [facility] Neuro [facility] [address] [place], [place] [address] [phone] 04/20/17, 2:27 PM   Name: RESHAD SAAB MRN: [id number] Date of Birth: 22-Feb-1936

## 2017-04-23 ENCOUNTER — Encounter: Payer: Self-pay | Admitting: Physical Therapy

## 2017-04-23 ENCOUNTER — Ambulatory Visit: Payer: Medicare Other | Admitting: Physical Therapy

## 2017-04-23 DIAGNOSIS — M6281 Muscle weakness (generalized): Secondary | ICD-10-CM

## 2017-04-23 DIAGNOSIS — R296 Repeated falls: Secondary | ICD-10-CM

## 2017-04-23 DIAGNOSIS — R2681 Unsteadiness on feet: Secondary | ICD-10-CM

## 2017-04-23 DIAGNOSIS — R262 Difficulty in walking, not elsewhere classified: Secondary | ICD-10-CM

## 2017-04-23 NOTE — Therapy (Signed)
[facility] [address] [place], [place], [address] Phone: [phone]   Fax:  [phone]  Physical Therapy Treatment  Patient Details  Name: Seth Gilbert MRN: [id number] Date of Birth: 08-13-1936 Referring Provider: Berta Minor B. Jaynee Eagles, MD  Encounter Date: 04/23/2017      PT End of Session - 04/23/17 1318    Visit Number 8   Number of Visits 13   Date for PT Re-Evaluation 62/56/38  8 week certification; reassess goals at 4 weeks   Authorization Type [employer] Cross [facility] code and progress note 10th visit   PT Start Time 1316   PT Stop Time 1403   PT Time Calculation (min) 47 min   Equipment Utilized During Treatment Gait belt   Activity Tolerance Patient tolerated treatment well   Behavior During Therapy WFL for tasks assessed/performed      Past Medical History:  Diagnosis Date  . Cancer ([place])    NHL  . Diverticulitis   . GERD (gastroesophageal reflux disease)    Heartburn at times  . Heart murmur    "comes and goes" onset 4th grade - last time anyone heard it was 01/2014  . History of hiatal hernia   . History of kidney stones   . HOH (hard of hearing)   . Non-Hodgkin lymphoma ([place])   . Peripheral neuropathy 09/11/2016  . PONV (postoperative nausea and vomiting)   . Rosacea   . Wears glasses     Past Surgical History:  Procedure Laterality Date  . APPENDECTOMY     2-3 yrs old  . COLONOSCOPY    . HEMORROIDECTOMY     inflammed anal gland removed  . INGUINAL HERNIA REPAIR Left 02/11/2014   Procedure: LEFT INGUINAL HERNIA REPAIR WITH MESH ;  Surgeon: Joyice Faster. Cornett, MD;  Location: [facility];  Service: General;  Laterality: Left;  . INSERTION OF MESH N/A 02/11/2014   Procedure: INSERTION OF MESH;  Surgeon: Joyice Faster. Cornett, MD;  Location: [facility];  Service: General;  Laterality: N/A;  . PORTACATH PLACEMENT Left 04/10/2016   Procedure: INSERTION  PORT-A-CATH into LEFT Subclavian Vein with guidance from ultrasound and flouroscopy;  Surgeon: Grace Isaac, MD;  Location: [facility];  Service: Thoracic;  Laterality: Left;  . TONSILLECTOMY    . VIDEO BRONCHOSCOPY WITH ENDOBRONCHIAL ULTRASOUND N/A 03/30/2016   Procedure: VIDEO BRONCHOSCOPY WITH ENDOBRONCHIAL ULTRASOUND,WITH TRANSBRONCHIAL BIOPSY;  Surgeon: Grace Isaac, MD;  Location: [facility];  Service: Thoracic;  Laterality: N/A;    There were no vitals filed for this visit.      Subjective Assessment - 04/23/17 1317    Subjective No new complaints. No falls or pain to report. Did mow grass yesterday for 4th time with no issues, just felt slower than he used to be.   Pertinent History lymphoma with chemo, HOH   Limitations Walking;Other (comment)   Patient Stated Goals Have sufficient strength and balance to stand from kneeling when working on [employer] and performing other household tasks   Currently in Pain? No/denies   Pain Score 0-No pain            [facility] PT Assessment - 04/23/17 1320      Transfers   Transfers Floor to Transfer     Functional Gait  Assessment   Gait assessed  Yes   Gait Level Surface Walks 20 ft in less than 5.5 sec, no assistive devices, good speed, no evidence for imbalance,  normal gait pattern, deviates no more than 6 in outside of the 12 in walkway width.   Change in Gait Speed Able to smoothly change walking speed without loss of balance or gait deviation. Deviate no more than 6 in outside of the 12 in walkway width.   Gait with Horizontal Head Turns Performs head turns smoothly with no change in gait. Deviates no more than 6 in outside 12 in walkway width   Gait with Vertical Head Turns Performs head turns with no change in gait. Deviates no more than 6 in outside 12 in walkway width.   Gait and Pivot Turn Pivot turns safely within 3 sec and stops quickly with no loss of balance.   Step Over Obstacle Is able to step over 2 stacked shoe boxes taped  together (9 in total height) without changing gait speed. No evidence of imbalance.   Gait with Narrow Base of Support Ambulates less than 4 steps heel to toe or cannot perform without assistance.   Gait with Eyes Closed Walks 20 ft, slow speed, abnormal gait pattern, evidence for imbalance, deviates 10-15 in outside 12 in walkway width. Requires more than 9 sec to ambulate 20 ft.   Ambulating Backwards Walks 20 ft, no assistive devices, good speed, no evidence for imbalance, normal gait   Steps Alternating feet, no rail.   Total Score 25         04/23/17 1320  Transfers  Transfers Floor to Transfer  Floor to Transfer 5: Supervision  Floor to Transfer Details (indicate cue type and reason) on mat <>tall kneeling<>half kneeling<>standing with light finger tip touch  to mat needed  with left foot forwad and full hand support needed with right foot forward   Neuro Re-ed   Neuro Re-ed Details  on mini tramp: squats with UE support x 10 reps; working on lateral jumps with feet side by side x 10 reps; scissor jumps with minimal lift off trampoline x 10 reps- UE support with cues on posture and weight shifting; on inverted BOSU: alternating fwd step ups with one foot to BOSU and lifting other foot in air for a march x 10 each side with UE support; standing on inverted BOSU: rocking fwd/bwd and laterally x 10 each way, mini squats x 10 reps. light UE support. min guard to min assist for balance.    Exercises  Other Exercises  sit<>stand with feet on balance board in lateral dirrection x 10 reps with minimal UE support, min guard assist for balance.              PT Short Term Goals - 04/18/17 1430      PT SHORT TERM GOAL #1   Title = LTG           PT Long Term Goals - 04/23/17 1319      PT LONG TERM GOAL #1   Title Pt will improve FGA score to 26/30 to reduce risk of falls   Baseline 04/24/27: scored 25/30 today, improved from eval of score 22/30, just not to goal   Status Partially  Met     PT LONG TERM GOAL #2   Title Pt will improve LE strength to 4+/5 bilaterally throughout   Status On-going     PT LONG TERM GOAL #3   Title Pt will improve ABC by 5 to 8%   Baseline 90%   Status On-going     PT LONG TERM GOAL #4   Title Pt will be able  to stand from kneeling with no UE support   Baseline 04/23/17: pt continues to need UE support   Status Not Met         04/23/17 1318  Plan  Clinical Impression Statement Todays skilled session continued to focus on balance and LE strengthening. Also began to check LTGs as they are due 04/28/17 (office is closed on 04/27/17). Pt has partially met 1 goal, did not meet goal for standing without UE support. Pt has 2 more goals left to check at next session.  Pt will benefit from skilled therapeutic intervention in order to improve on the following deficits Decreased balance;Decreased strength;Impaired sensation;Decreased activity tolerance;Decreased endurance  Rehab Potential Excellent  Clinical Impairments Affecting Rehab Potential chemo induced peripheral neuropathy  PT Frequency Other (comment) (2x week for 4 weeks then 1x week for 4 weeks)  PT Duration 8 weeks  PT Treatment/Interventions Gait training;Stair training;Functional mobility training;Therapeutic activities;Therapeutic exercise;Balance training;Neuromuscular re-education;Patient/family education;Energy conservation;ADLs/Self Care Home Management  PT Next Visit Plan check remaining LTGs  PT Home Exercise Plan Pt reports [facility] [employer] [facility]  Consulted and Agree with Plan of Care Patient          Patient will benefit from skilled therapeutic intervention in order to improve the following deficits and impairments:  Decreased balance, Decreased strength, Impaired sensation, Decreased activity tolerance, Decreased endurance  Visit Diagnosis: Muscle weakness (generalized)  Repeated falls  Unsteadiness on feet  Difficulty in  walking, not elsewhere classified     Problem List Patient Active Problem List   Diagnosis Date Noted  . Neuropathy due to chemotherapeutic drug ([place]) 02/12/2017  . Port catheter in place 11/06/2016  . Peripheral neuropathy 09/11/2016  . Atypical pneumonia 06/18/2016  . Acute respiratory failure with hypoxia ([place]) 06/18/2016  . Fever 06/16/2016  . Lactic acidosis 06/16/2016  . Dehydration 06/16/2016  . Encounter for antineoplastic chemotherapy 04/17/2016  . NHL (non-Hodgkin's lymphoma) ([place]) 04/05/2016  . Diffuse large B-cell lymphoma of lymph nodes of multiple regions ([place]) 04/05/2016  . Lung mass 03/13/2016  . Post-operative state 03/08/2014  . Left inguinal hernia 05/08/2013    Willow Ora, PTA, [facility] Outpatient Neuro [facility] [address] [place], [place] [address] [phone] 04/25/17, 8:45 AM   Name: Seth Gilbert MRN: [id number] Date of Birth: 1936-07-26

## 2017-04-25 ENCOUNTER — Ambulatory Visit: Payer: Medicare Other | Admitting: Physical Therapy

## 2017-04-25 DIAGNOSIS — R262 Difficulty in walking, not elsewhere classified: Secondary | ICD-10-CM

## 2017-04-25 DIAGNOSIS — M21372 Foot drop, left foot: Secondary | ICD-10-CM

## 2017-04-25 DIAGNOSIS — M6281 Muscle weakness (generalized): Secondary | ICD-10-CM

## 2017-04-25 DIAGNOSIS — R2681 Unsteadiness on feet: Secondary | ICD-10-CM | POA: Diagnosis not present

## 2017-04-25 DIAGNOSIS — M21371 Foot drop, right foot: Secondary | ICD-10-CM

## 2017-04-25 DIAGNOSIS — R296 Repeated falls: Secondary | ICD-10-CM

## 2017-04-25 NOTE — Therapy (Signed)
[facility] [address] [place], [place], [address] Phone: [phone]   Fax:  [phone]  Physical Therapy Treatment  Patient Details  Name: Seth Gilbert MRN: [id number] Date of Birth: [birth year] Referring Provider: Berta Minor B. Jaynee Eagles, MD  Encounter Date: 04/25/2017      PT End of Session - 04/25/17 1624    Visit Number 9   Number of Visits 13   Date for PT Re-Evaluation 05/28/17   Authorization Type Blue Cross [employer] Medicare-G code and progress note 10th visit   PT Start Time 1537   PT Stop Time 1617   PT Time Calculation (min) 40 min   Equipment Utilized During Treatment --   Activity Tolerance Patient tolerated treatment well   Behavior During Therapy WFL for tasks assessed/performed      Past Medical History:  Diagnosis Date  . Cancer ([place])    NHL  . Diverticulitis   . GERD (gastroesophageal reflux disease)    Heartburn at times  . Heart murmur    "comes and goes" onset 4th grade - last time anyone heard it was 01/2014  . History of hiatal hernia   . History of kidney stones   . HOH (hard of hearing)   . Non-Hodgkin lymphoma ([place])   . Peripheral neuropathy 09/11/2016  . PONV (postoperative nausea and vomiting)   . Rosacea   . Wears glasses     Past Surgical History:  Procedure Laterality Date  . APPENDECTOMY     2-3 yrs old  . COLONOSCOPY    . HEMORROIDECTOMY     inflammed anal gland removed  . INGUINAL HERNIA REPAIR Left 02/11/2014   Procedure: LEFT INGUINAL HERNIA REPAIR WITH MESH ;  Surgeon: Joyice Faster. Cornett, MD;  Location: [facility];  Service: General;  Laterality: Left;  . INSERTION OF MESH N/A 02/11/2014   Procedure: INSERTION OF MESH;  Surgeon: Joyice Faster. Cornett, MD;  Location: [facility];  Service: General;  Laterality: N/A;  . PORTACATH PLACEMENT Left 04/10/2016   Procedure: INSERTION PORT-A-CATH into LEFT Subclavian Vein with guidance from  ultrasound and flouroscopy;  Surgeon: Grace Isaac, MD;  Location: [facility];  Service: Thoracic;  Laterality: Left;  . TONSILLECTOMY    . VIDEO BRONCHOSCOPY WITH ENDOBRONCHIAL ULTRASOUND N/A 03/30/2016   Procedure: VIDEO BRONCHOSCOPY WITH ENDOBRONCHIAL ULTRASOUND,WITH TRANSBRONCHIAL BIOPSY;  Surgeon: Grace Isaac, MD;  Location: [facility];  Service: Thoracic;  Laterality: N/A;    There were no vitals filed for this visit.      Subjective Assessment - 04/25/17 1543    Subjective No new issues, mild soreness after session two days ago but improved today.  Starts the [employer] at [facility] next week, 2x/week.  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   Pertinent History lymphoma with chemo, HOH   Limitations Walking;Other (comment)   Patient Stated Goals Have sufficient strength and balance to stand from kneeling when working on [employer] and performing other household tasks   Currently in Pain? No/denies            [facility] PT Assessment - 04/25/17 1545      ROM / Strength   AROM / PROM / Strength Strength     Strength   Strength Assessment Site Hip;Knee;Ankle   Right/Left Hip Right;Left   Right Hip Flexion 4+/5   Left Hip Flexion 4+/5  Right/Left Knee Right;Left   Right Knee Flexion 4+/5   Right Knee Extension 5/5   Left Knee Flexion 4+/5   Left Knee Extension 5/5   Right/Left Ankle Right;Left   Right Ankle Dorsiflexion 4-/5   Right Ankle Plantar Flexion 4/5   Left Ankle Dorsiflexion 4+/5   Left Ankle Plantar Flexion 4/5                     [facility] Adult PT Treatment/Exercise - 04/25/17 1615      Knee/Hip Exercises: Stretches   Gastroc Stretch Both;3 reps;30 seconds   Gastroc Stretch Limitations standing with heels off 6 inch step     Knee/Hip Exercises: Standing   Forward Lunges Right;Left;2 sets;10 reps   Forward Lunges Limitations lunging forwards onto 6" step standing on solid  surface x 10 reps and then standing on compliant blue foam x 10 reps beginning with UE support progressing to no UE support   Wall Squat 5 reps;10 seconds   Wall Squat Limitations with alternating ankle DF activation x 10 reps each                PT Education - 04/25/17 1624    Education provided Yes   Education Details modifications to current HEP; added back in ankle DF strengthening   Person(s) Educated Patient   Methods Explanation;Demonstration   Comprehension Verbalized understanding;Returned demonstration          PT Short Term Goals - 04/18/17 1430      PT SHORT TERM GOAL #1   Title = LTG           PT Long Term Goals - 04/25/17 1631      PT LONG TERM GOAL #1   Title Pt will improve FGA score to 26/30 to reduce risk of falls   Baseline 25/30   Time 4   Period Weeks   Status Revised   Target Date 05/28/17     PT LONG TERM GOAL #2   Title Pt will improve ankle PF/DF strength to 4+/5 bilaterally throughout   Baseline ankle DF and PF 4- to 4/5 bilaterally   Time 4   Period Weeks   Status Revised   Target Date 05/28/17     PT LONG TERM GOAL #3   Title Pt will improve ABC by 5 to 8%   Baseline 90%   Time 4   Period Weeks   Status On-going   Target Date 05/28/17     PT LONG TERM GOAL #4   Title Pt will be able to stand from kneeling with no UE support   Baseline 04/23/17: pt continues to need UE support   Time 4   Period Weeks   Status Revised   Target Date 05/28/17               Plan - 04/25/17 1626    Clinical Impression Statement Treatment session today with focus on continued assessment of LTG to determine progress.  Pt is making steady progress with balance and strengthening but continues to demonstrate decreased functional strength in bilat ankles due to neuropathy.  Rest of session focused on review of and progression of ankle strengthening exercises given to pt previously and adding to current HEP.  Pt tolerated well.  Will  continue with PT 1x/week x 4 more weeks to continue to address impairments and to continue to decrease risk for falls while performing functional activities outside.   Rehab Potential Excellent   Clinical Impairments  Affecting Rehab Potential chemo induced peripheral neuropathy   PT Frequency Other (comment)  2x week for 4 weeks then 1x week for 4 weeks   PT Duration 8 weeks   PT Treatment/Interventions Gait training;Stair training;Functional mobility training;Therapeutic activities;Therapeutic exercise;Balance training;Neuromuscular re-education;Patient/family education;Energy conservation;ADLs/Self Care Home Management   PT Next Visit Plan G CODE AND PN DUE!! (use FGA score from last session); continue to focus on high level ankle, knee, hip balance and strengthening working towards standing from floor without UE support.  Wall squats, lunges, balance board, balance beam, etc-solid and compliant surfaces.  Yoga poses   PT Home Exercise Plan Pt reports [facility] [employer] [facility]   Consulted and Agree with Plan of Care Patient      Patient will benefit from skilled therapeutic intervention in order to improve the following deficits and impairments:  Decreased balance, Decreased strength, Impaired sensation, Decreased activity tolerance, Decreased endurance  Visit Diagnosis: Muscle weakness (generalized)  Repeated falls  Foot drop, left  Foot drop, right  Difficulty in walking, not elsewhere classified     Problem List Patient Active Problem List   Diagnosis Date Noted  . Neuropathy due to chemotherapeutic drug ([place]) 02/12/2017  . Port catheter in place 11/06/2016  . Peripheral neuropathy 09/11/2016  . Atypical pneumonia 06/18/2016  . Acute respiratory failure with hypoxia ([place]) 06/18/2016  . Fever 06/16/2016  . Lactic acidosis 06/16/2016  . Dehydration 06/16/2016  . Encounter for antineoplastic chemotherapy 04/17/2016  . NHL (non-Hodgkin's  lymphoma) ([place]) 04/05/2016  . Diffuse large B-cell lymphoma of lymph nodes of multiple regions ([place]) 04/05/2016  . Lung mass 03/13/2016  . Post-operative state 03/08/2014  . Left inguinal hernia 05/08/2013   Raylene Everts, PT, DPT 04/25/17    4:35 PM    [facility] [address] [place], [place], [address] Phone: [phone]   Fax:  [phone]  Name: Seth Gilbert MRN: [id number] Date of Birth: 06-13-1936

## 2017-05-01 ENCOUNTER — Ambulatory Visit: Payer: Medicare Other | Attending: Neurology | Admitting: Physical Therapy

## 2017-05-01 ENCOUNTER — Encounter: Payer: Self-pay | Admitting: Physical Therapy

## 2017-05-01 DIAGNOSIS — R262 Difficulty in walking, not elsewhere classified: Secondary | ICD-10-CM | POA: Insufficient documentation

## 2017-05-01 DIAGNOSIS — M21371 Foot drop, right foot: Secondary | ICD-10-CM | POA: Diagnosis present

## 2017-05-01 DIAGNOSIS — M21372 Foot drop, left foot: Secondary | ICD-10-CM

## 2017-05-01 DIAGNOSIS — R296 Repeated falls: Secondary | ICD-10-CM | POA: Diagnosis present

## 2017-05-01 DIAGNOSIS — M6281 Muscle weakness (generalized): Secondary | ICD-10-CM | POA: Diagnosis present

## 2017-05-01 NOTE — Therapy (Signed)
[facility] [address] [place], [place], [address] Phone: [phone]   Fax:  [phone]  Physical Therapy Treatment  Patient Details  Name: Seth CUDWORTH MRN: [id number] Date of Birth: 1936-02-13 Referring Provider: Berta Minor B. Jaynee Eagles, MD  Encounter Date: 05/01/2017      PT End of Session - 05/01/17 1634    Visit Number 10   Number of Visits 13   Date for PT Re-Evaluation 05/28/17   Authorization Type Blue Cross Blue Shield Medicare-G code and progress note 10th visit   PT Start Time 1449   PT Stop Time 1539   PT Time Calculation (min) 50 min   Activity Tolerance Patient tolerated treatment well   Behavior During Therapy WFL for tasks assessed/performed      Past Medical History:  Diagnosis Date  . Cancer ([place])    NHL  . Diverticulitis   . GERD (gastroesophageal reflux disease)    Heartburn at times  . Heart murmur    "comes and goes" onset 4th grade - last time anyone heard it was 01/2014  . History of hiatal hernia   . History of kidney stones   . HOH (hard of hearing)   . Non-Hodgkin lymphoma ([place])   . Peripheral neuropathy 09/11/2016  . PONV (postoperative nausea and vomiting)   . Rosacea   . Wears glasses     Past Surgical History:  Procedure Laterality Date  . APPENDECTOMY     2-3 yrs old  . COLONOSCOPY    . HEMORROIDECTOMY     inflammed anal gland removed  . INGUINAL HERNIA REPAIR Left 02/11/2014   Procedure: LEFT INGUINAL HERNIA REPAIR WITH MESH ;  Surgeon: Joyice Faster. Cornett, MD;  Location: [facility];  Service: General;  Laterality: Left;  . INSERTION OF MESH N/A 02/11/2014   Procedure: INSERTION OF MESH;  Surgeon: Joyice Faster. Cornett, MD;  Location: [facility];  Service: General;  Laterality: N/A;  . PORTACATH PLACEMENT Left 04/10/2016   Procedure: INSERTION PORT-A-CATH into LEFT Subclavian Vein with guidance from ultrasound and flouroscopy;  Surgeon: Grace Isaac, MD;  Location: [facility];  Service: Thoracic;  Laterality: Left;  . TONSILLECTOMY    . VIDEO BRONCHOSCOPY WITH ENDOBRONCHIAL ULTRASOUND N/A 03/30/2016   Procedure: VIDEO BRONCHOSCOPY WITH ENDOBRONCHIAL ULTRASOUND,WITH TRANSBRONCHIAL BIOPSY;  Surgeon: Grace Isaac, MD;  Location: [facility];  Service: Thoracic;  Laterality: N/A;    There were no vitals filed for this visit.      Subjective Assessment - 05/01/17 1452    Subjective Had first day at [employer]-first day was discussing motivation and support.  Tomorrow will participate in benchmarks.  Over the weekend did a lot around the house and in the yard.  Was able to get down and up from ground with UE support but did not have to think about it-more automatic.     Patient is accompained by: Family member   Pertinent History lymphoma with chemo, HOH   Limitations Walking;Other (comment)   Patient Stated Goals Have sufficient strength and balance to stand from kneeling when working on [employer] and performing other household tasks                              Balance Exercises - 05/01/17 1523      Balance Exercises: Standing   Balance Beam Tandem gait x 6 laps, tandem gait with alternating heel taps to  floor x 6 laps, tandem stance on balance beam with 10 reps dynamic UE movements (flexion) with R foot forwards and then L foot forwards, partial lunge > single limb stance on beam x 10 reps each side   Other Standing Exercises From floor with knee on pillow: half kneel <> low/runner's lunge x 10 reps each side with intermittent UE support and holding final pose with bilat UE lifted x 2-4 seconds; transitioned from tall lunge <> warrior 2 pose and performed rocking/weight shifting forwards and back bending and straigthening front knee x 10 reps each side           PT Education - 05/01/17 1633    Education provided Yes   Education Details runner's lunge > warrior 2 transition for [employer]) Educated  Patient   Methods Explanation;Demonstration;Handout   Comprehension Verbalized understanding;Returned demonstration          PT Short Term Goals - 04/18/17 1430      PT SHORT TERM GOAL #1   Title = LTG           PT Long Term Goals - 04/25/17 1631      PT LONG TERM GOAL #1   Title Pt will improve FGA score to 26/30 to reduce risk of falls   Baseline 25/30   Time 4   Period Weeks   Status Revised   Target Date 05/28/17     PT LONG TERM GOAL #2   Title Pt will improve ankle PF/DF strength to 4+/5 bilaterally throughout   Baseline ankle DF and PF 4- to 4/5 bilaterally   Time 4   Period Weeks   Status Revised   Target Date 05/28/17     PT LONG TERM GOAL #3   Title Pt will improve ABC by 5 to 8%   Baseline 90%   Time 4   Period Weeks   Status On-going   Target Date 05/28/17     PT LONG TERM GOAL #4   Title Pt will be able to stand from kneeling with no UE support   Baseline 04/23/17: pt continues to need UE support   Time 4   Period Weeks   Status Revised   Target Date 05/28/17               Plan - 05/01/17 1635    Clinical Impression Statement Continued to focus on ankle, knee and hip strengthening and dynamic balance training incorporating balance beam exercises and repeated transitions between half kneeling <> runner's lunge <> warrior 2 positions.  Pt tolerated well and noted significant effort in quads.  Will continued to address and work towards performing lunges/transitions outside on more realistic settings/surfaces.   Rehab Potential Excellent   Clinical Impairments Affecting Rehab Potential chemo induced peripheral neuropathy   PT Frequency Other (comment)  2x week for 4 weeks then 1x week for 4 weeks   PT Duration 8 weeks   PT Treatment/Interventions Gait training;Stair training;Functional mobility training;Therapeutic activities;Therapeutic exercise;Balance training;Neuromuscular re-education;Patient/family education;Energy  conservation;ADLs/Self Care Home Management   PT Next Visit Plan continue to focus on high level ankle, knee, hip balance and strengthening working towards standing from floor without UE support, try it outside?  Wall squats, lunges, balance board, balance beam, etc-solid and compliant surfaces.  Yoga poses   PT Home Exercise Plan Pt reports [facility] [employer] [facility] [employer] [facility]   Consulted and Agree with Plan of Care Patient  Patient will benefit from skilled therapeutic intervention in order to improve the following deficits and impairments:  Decreased balance, Decreased strength, Impaired sensation, Decreased activity tolerance, Decreased endurance  Visit Diagnosis: Muscle weakness (generalized)  Repeated falls  Foot drop, left  Foot drop, right  Difficulty in walking, not elsewhere classified       G-Codes - 2017-05-06 1649    Functional Assessment Tool Used (Outpatient Only) FGA 25/30   Functional Limitation Mobility: Walking and moving around   Mobility: Walking and Moving Around Current Status ([id number]) At least 1 percent but less than 20 percent impaired, limited or restricted   Mobility: Walking and Moving Around Goal Status [phone]) At least 1 percent but less than 20 percent impaired, limited or restricted      Problem List Patient Active Problem List   Diagnosis Date Noted  . Neuropathy due to chemotherapeutic drug ([place]) 02/12/2017  . Port catheter in place 11/06/2016  . Peripheral neuropathy 09/11/2016  . Atypical pneumonia 06/18/2016  . Acute respiratory failure with hypoxia ([place]) 06/18/2016  . Fever 06/16/2016  . Lactic acidosis 06/16/2016  . Dehydration 06/16/2016  . Encounter for antineoplastic chemotherapy 04/17/2016  . NHL (non-Hodgkin's lymphoma) ([place]) 04/05/2016  . Diffuse large B-cell lymphoma of lymph nodes of multiple regions ([place]) 04/05/2016  . Lung mass 03/13/2016  . Post-operative state 03/08/2014  . Left inguinal hernia  05/08/2013   Physical Therapy Progress Note  Dates of Reporting Period: 03/29/17 to 05-06-17  Objective Reports of Subjective Statement: See above  Objective Measurements: FGA: 25/30  Goal Update: See LTG above  Plan: Continue dynamic balance training and LE strengthening in conjunction with pt participating in [place] 2x/week  Reason Skilled Services are Required: to continue to progress towards LTG, to decrease falls risk with outdoor/community activities and maximize functional recovery and independence  Raylene Everts, PT, DPT 2017/05/06    4:57 PM    [facility] [address] [place], [place], [address] Phone: [phone]   Fax:  [phone]  Name: Seth Gilbert MRN: [id number] Date of Birth: [birth year]

## 2017-05-01 NOTE — Patient Instructions (Addendum)
Warrior II    In wide stance, arms extended out, rotate right leg out 90, left leg in 20. Bend right leg 90 in line with foot. Keep left foot flat, hips square to front. Shift forwards bending front knee and then shift backwards straightening front knee, 10 times, repeat on other side. NOTE: Keep bent knee behind line of toes.   http://yg.exer.us/16   Copyright  VHI. All rights reserved.

## 2017-05-02 ENCOUNTER — Telehealth: Payer: Self-pay | Admitting: Internal Medicine

## 2017-05-02 NOTE — Telephone Encounter (Signed)
Spoke with patient and got his flushes scheduled until Jan.

## 2017-05-03 ENCOUNTER — Ambulatory Visit: Payer: Medicare Other | Admitting: Physical Therapy

## 2017-05-06 ENCOUNTER — Ambulatory Visit (HOSPITAL_BASED_OUTPATIENT_CLINIC_OR_DEPARTMENT_OTHER): Payer: Medicare Other

## 2017-05-06 DIAGNOSIS — Z452 Encounter for adjustment and management of vascular access device: Secondary | ICD-10-CM | POA: Diagnosis not present

## 2017-05-06 DIAGNOSIS — Z95828 Presence of other vascular implants and grafts: Secondary | ICD-10-CM

## 2017-05-06 DIAGNOSIS — C8338 Diffuse large B-cell lymphoma, lymph nodes of multiple sites: Secondary | ICD-10-CM | POA: Diagnosis not present

## 2017-05-06 MED ORDER — HEPARIN SOD (PORK) LOCK FLUSH 100 UNIT/ML IV SOLN
500.0000 [IU] | Freq: Once | INTRAVENOUS | Status: AC | PRN
  Administered 2017-05-06: 500 [IU] via INTRAVENOUS
  Filled 2017-05-06: qty 5

## 2017-05-06 MED ORDER — SODIUM CHLORIDE 0.9% FLUSH
10.0000 mL | INTRAVENOUS | Status: DC | PRN
  Administered 2017-05-06: 10 mL via INTRAVENOUS
  Filled 2017-05-06: qty 10

## 2017-05-08 ENCOUNTER — Ambulatory Visit: Payer: Medicare Other | Admitting: Physical Therapy

## 2017-05-08 ENCOUNTER — Encounter: Payer: Self-pay | Admitting: Physical Therapy

## 2017-05-08 DIAGNOSIS — M6281 Muscle weakness (generalized): Secondary | ICD-10-CM

## 2017-05-08 DIAGNOSIS — M21372 Foot drop, left foot: Secondary | ICD-10-CM

## 2017-05-08 DIAGNOSIS — M21371 Foot drop, right foot: Secondary | ICD-10-CM

## 2017-05-08 DIAGNOSIS — R296 Repeated falls: Secondary | ICD-10-CM

## 2017-05-08 DIAGNOSIS — R262 Difficulty in walking, not elsewhere classified: Secondary | ICD-10-CM

## 2017-05-08 NOTE — Therapy (Signed)
[facility] [address] [place], [place], [address] Phone: [phone]   Fax:  [phone]  Physical Therapy Treatment  Patient Details  Name: Seth Gilbert MRN: [id number] Date of Birth: November 05, 1935 Referring Provider: Berta Minor B. Jaynee Eagles, MD  Encounter Date: 05/08/2017      PT End of Session - 05/08/17 1648    Visit Number 11   Number of Visits 13   Date for PT Re-Evaluation 05/28/17   Authorization Type Blue Cross Blue Shield Medicare-G code and progress note 10th visit   PT Start Time 1450   PT Stop Time 1535   PT Time Calculation (min) 45 min   Activity Tolerance Patient tolerated treatment well   Behavior During Therapy WFL for tasks assessed/performed      Past Medical History:  Diagnosis Date  . Cancer ([place])    NHL  . Diverticulitis   . GERD (gastroesophageal reflux disease)    Heartburn at times  . Heart murmur    "comes and goes" onset 4th grade - last time anyone heard it was 01/2014  . History of hiatal hernia   . History of kidney stones   . HOH (hard of hearing)   . Non-Hodgkin lymphoma ([place])   . Peripheral neuropathy 09/11/2016  . PONV (postoperative nausea and vomiting)   . Rosacea   . Wears glasses     Past Surgical History:  Procedure Laterality Date  . APPENDECTOMY     2-3 yrs old  . COLONOSCOPY    . HEMORROIDECTOMY     inflammed anal gland removed  . INGUINAL HERNIA REPAIR Left 02/11/2014   Procedure: LEFT INGUINAL HERNIA REPAIR WITH MESH ;  Surgeon: Joyice Faster. Cornett, MD;  Location: [facility];  Service: General;  Laterality: Left;  . INSERTION OF MESH N/A 02/11/2014   Procedure: INSERTION OF MESH;  Surgeon: Joyice Faster. Cornett, MD;  Location: [facility];  Service: General;  Laterality: N/A;  . PORTACATH PLACEMENT Left 04/10/2016   Procedure: INSERTION PORT-A-CATH into LEFT Subclavian Vein with guidance from ultrasound and flouroscopy;  Surgeon: Grace Isaac, MD;  Location: [facility];  Service: Thoracic;  Laterality: Left;  . TONSILLECTOMY    . VIDEO BRONCHOSCOPY WITH ENDOBRONCHIAL ULTRASOUND N/A 03/30/2016   Procedure: VIDEO BRONCHOSCOPY WITH ENDOBRONCHIAL ULTRASOUND,WITH TRANSBRONCHIAL BIOPSY;  Surgeon: Grace Isaac, MD;  Location: [facility];  Service: Thoracic;  Laterality: N/A;    There were no vitals filed for this visit.      Subjective Assessment - 05/08/17 1455    Subjective Pt had benchmarks at [facility] and was only able to stand for 3 seconds SLS.  Performed some aerobic on elliptical x 15 minutes and did feel fatigue in calf and anterior tibia.  Also did 6 min walk test and ambulated >1500.     Pertinent History lymphoma with chemo, HOH   Limitations Walking;Other (comment)   Patient Stated Goals Have sufficient strength and balance to stand from kneeling when working on [employer] and performing other household tasks   Currently in Pain? No/denies                         [facility] Adult PT Treatment/Exercise - 05/08/17 1510      Neuro Re-ed    Neuro Re-ed Details  on red mat on floor performed transitions from tall kneel > half kneel with UE support on physioball with one LOB to L with pt  falling back onto the floor without injury.  Pt noted to have limited ROM and excursion in trunk during weight shifting           PWR [facility]) - 05/08/17 1647    PWR! exercises Moves in [place]! Up x 10   PWR! Rock x 10 but with modification-UE on mat due to limited thoracic ROM   PWR! Twist x 10   PWR! Step x 10 but with modification-UE on mat due to limited ROM             PT Education - 05/08/17 1648    Education provided Yes   Education Details PWR in quadruped for trunk and hip ROM to prepare for transition from floor          PT Short Term Goals - 04/18/17 1430      PT SHORT TERM GOAL #1   Title = LTG           PT Long Term Goals - 04/25/17 1631      PT LONG TERM GOAL #1   Title Pt  will improve FGA score to 26/30 to reduce risk of falls   Baseline 25/30   Time 4   Period Weeks   Status Revised   Target Date 05/28/17     PT LONG TERM GOAL #2   Title Pt will improve ankle PF/DF strength to 4+/5 bilaterally throughout   Baseline ankle DF and PF 4- to 4/5 bilaterally   Time 4   Period Weeks   Status Revised   Target Date 05/28/17     PT LONG TERM GOAL #3   Title Pt will improve ABC by 5 to 8%   Baseline 90%   Time 4   Period Weeks   Status On-going   Target Date 05/28/17     PT LONG TERM GOAL #4   Title Pt will be able to stand from kneeling with no UE support   Baseline 04/23/17: pt continues to need UE support   Time 4   Period Weeks   Status Revised   Target Date 05/28/17               Plan - 05/08/17 1649    Clinical Impression Statement Returned to floor <> stand training with pt demonstrating improved technique and ROM for lunge position.  Returned to floor to perform balance training while performing transitions between tall > half kneeling with UE support on therapy ball but pt having significant difficulty maintaining balance during transition and difficulty isolating trunk excursion for weight shift.  Transitioned to [facility] in quadruped as warm up for floor > stand to facilitate upper thoracic and trunk ROM.  Will continue to address and progress towards LTG.   Rehab Potential Excellent   Clinical Impairments Affecting Rehab Potential chemo induced peripheral neuropathy   PT Frequency Other (comment)  2x week for 4 weeks then 1x week for 4 weeks   PT Duration 8 weeks   PT Treatment/Interventions Gait training;Stair training;Functional mobility training;Therapeutic activities;Therapeutic exercise;Balance training;Neuromuscular re-education;Patient/family education;Energy conservation;ADLs/Self Care Home Management   PT Next Visit Plan continue to focus on PWR quadruped, high level ankle, knee, hip balance and strengthening working towards  standing from floor without UE support, try it outside?  Wall squats, lunges, balance board, balance beam, etc-solid and compliant surfaces.  Yoga poses   PT Home Exercise Plan Pt reports will [facility] [employer] [facility] [employer] [facility] of [facility]  Consulted and Agree with Plan of Care Patient      Patient will benefit from skilled therapeutic intervention in order to improve the following deficits and impairments:  Decreased balance, Decreased strength, Impaired sensation, Decreased activity tolerance, Decreased endurance  Visit Diagnosis: Muscle weakness (generalized)  Repeated falls  Foot drop, left  Foot drop, right  Difficulty in walking, not elsewhere classified     Problem List Patient Active Problem List   Diagnosis Date Noted  . Neuropathy due to chemotherapeutic drug ([place]) 02/12/2017  . Port catheter in place 11/06/2016  . Peripheral neuropathy 09/11/2016  . Atypical pneumonia 06/18/2016  . Acute respiratory failure with hypoxia ([place]) 06/18/2016  . Fever 06/16/2016  . Lactic acidosis 06/16/2016  . Dehydration 06/16/2016  . Encounter for antineoplastic chemotherapy 04/17/2016  . NHL (non-Hodgkin's lymphoma) ([place]) 04/05/2016  . Diffuse large B-cell lymphoma of lymph nodes of multiple regions ([place]) 04/05/2016  . Lung mass 03/13/2016  . Post-operative state 03/08/2014  . Left inguinal hernia 05/08/2013    Raylene Everts, PT, DPT 05/08/17    4:56 PM    [facility] [address] [place], [place], [address] Phone: [phone]   Fax:  [phone]  Name: Seth Gilbert MRN: [id number] Date of Birth: 13-Jul-1936

## 2017-05-15 ENCOUNTER — Ambulatory Visit: Payer: Medicare Other | Admitting: Physical Therapy

## 2017-05-15 ENCOUNTER — Encounter: Payer: Self-pay | Admitting: Physical Therapy

## 2017-05-15 DIAGNOSIS — R296 Repeated falls: Secondary | ICD-10-CM

## 2017-05-15 DIAGNOSIS — R262 Difficulty in walking, not elsewhere classified: Secondary | ICD-10-CM

## 2017-05-15 DIAGNOSIS — M6281 Muscle weakness (generalized): Secondary | ICD-10-CM | POA: Diagnosis not present
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
## 2017-05-22 ENCOUNTER — Ambulatory Visit: Payer: Medicare Other | Admitting: Physical Therapy

## 2017-05-22 ENCOUNTER — Encounter: Payer: Self-pay | Admitting: Physical Therapy

## 2017-05-22 DIAGNOSIS — M6281 Muscle weakness (generalized): Secondary | ICD-10-CM

## 2017-05-22 DIAGNOSIS — M21372 Foot drop, left foot: Secondary | ICD-10-CM

## 2017-05-22 DIAGNOSIS — R296 Repeated falls: Secondary | ICD-10-CM

## 2017-05-22 DIAGNOSIS — R262 Difficulty in walking, not elsewhere classified: Secondary | ICD-10-CM

## 2017-05-22 DIAGNOSIS — M21371 Foot drop, right foot: Secondary | ICD-10-CM

## 2017-05-22 NOTE — Patient Instructions (Signed)
Quadruped: Hip Abduction / Knee Flexion (Active)    On hands and knees, lift right bent knee out to the side.  Complete __2_ sets of _10__ repetitions on each leg.  Keep hips level.  Perform _2_ sessions per day.  Copyright  VHI. All rights reserved.

## 2017-05-22 NOTE — Therapy (Signed)
[facility] [address] [place], [place], [address] Phone: [phone]   Fax:  [phone]  Physical Therapy Treatment  Patient Details  Name: Seth Gilbert MRN: [id number] Date of Birth: 12-28-1935 Referring Provider: Berta Minor B. Jaynee Eagles, MD  Encounter Date: 05/22/2017      PT End of Session - 05/22/17 1706    Visit Number 13   Number of Visits 13   Date for PT Re-Evaluation 05/28/17  D/C next visit   Authorization Type [employer] code and progress note 10th visit   PT Start Time 1315   PT Stop Time 1400   PT Time Calculation (min) 45 min   Activity Tolerance Patient tolerated treatment well   Behavior During Therapy WFL for tasks assessed/performed      Past Medical History:  Diagnosis Date  . Cancer ([place])    NHL  . Diverticulitis   . GERD (gastroesophageal reflux disease)    Heartburn at times  . Heart murmur    "comes and goes" onset 4th grade - last time anyone heard it was 01/2014  . History of hiatal hernia   . History of kidney stones   . HOH (hard of hearing)   . Non-Hodgkin lymphoma ([place])   . Peripheral neuropathy 09/11/2016  . PONV (postoperative nausea and vomiting)   . Rosacea   . Wears glasses     Past Surgical History:  Procedure Laterality Date  . APPENDECTOMY     2-3 yrs old  . COLONOSCOPY    . HEMORROIDECTOMY     inflammed anal gland removed  . INGUINAL HERNIA REPAIR Left 02/11/2014   Procedure: LEFT INGUINAL HERNIA REPAIR WITH MESH ;  Surgeon: Joyice Faster. Cornett, MD;  Location: [facility];  Service: General;  Laterality: Left;  . INSERTION OF MESH N/A 02/11/2014   Procedure: INSERTION OF MESH;  Surgeon: Joyice Faster. Cornett, MD;  Location: [facility];  Service: General;  Laterality: N/A;  . PORTACATH PLACEMENT Left 04/10/2016   Procedure: INSERTION PORT-A-CATH into LEFT Subclavian Vein with guidance from ultrasound and flouroscopy;   Surgeon: Grace Isaac, MD;  Location: [facility];  Service: Thoracic;  Laterality: Left;  . TONSILLECTOMY    . VIDEO BRONCHOSCOPY WITH ENDOBRONCHIAL ULTRASOUND N/A 03/30/2016   Procedure: VIDEO BRONCHOSCOPY WITH ENDOBRONCHIAL ULTRASOUND,WITH TRANSBRONCHIAL BIOPSY;  Surgeon: Grace Isaac, MD;  Location: [facility];  Service: Thoracic;  Laterality: N/A;    There were no vitals filed for this visit.      Subjective Assessment - 05/22/17 1319    Subjective Reports glute soreness from exercises, [employer], and raking leaves.     Patient is accompained by: Family member   Pertinent History lymphoma with chemo, HOH   Limitations Walking;Other (comment)   Patient Stated Goals Have sufficient strength and balance to stand from kneeling when working on [employer] and performing other household tasks   Currently in Pain? No/denies            [facility] PT Assessment - 05/22/17 1322      ROM / Strength   AROM / PROM / Strength Strength     Strength   Right Ankle Dorsiflexion 4+/5   Right Ankle Plantar Flexion 5/5   Left Ankle Dorsiflexion 4+/5   Left Ankle Plantar Flexion 5/5     Transfers   Floor to Transfer 5: Supervision   Floor to Transfer Details (indicate cue type and reason) Performed stand <> floor 2x  on L and RLE; pt continues to require UE pushing through thigh to stabilize when standing and verbal cues for forwards weight shift over front LE prior to standing.     Functional Gait  Assessment   Gait assessed  Yes   Gait Level Surface Walks 20 ft in less than 5.5 sec, no assistive devices, good speed, no evidence for imbalance, normal gait pattern, deviates no more than 6 in outside of the 12 in walkway width.   Change in Gait Speed Able to smoothly change walking speed without loss of balance or gait deviation. Deviate no more than 6 in outside of the 12 in walkway width.   Gait with Horizontal Head Turns Performs head turns smoothly with no change in gait. Deviates no more than 6  in outside 12 in walkway width   Gait with Vertical Head Turns Performs head turns with no change in gait. Deviates no more than 6 in outside 12 in walkway width.   Gait and Pivot Turn Pivot turns safely within 3 sec and stops quickly with no loss of balance.   Step Over Obstacle Is able to step over 2 stacked shoe boxes taped together (9 in total height) without changing gait speed. No evidence of imbalance.   Gait with Narrow Base of Support Ambulates 7-9 steps.   Gait with Eyes Closed Walks 20 ft, uses assistive device, slower speed, mild gait deviations, deviates 6-10 in outside 12 in walkway width. Ambulates 20 ft in less than 9 sec but greater than 7 sec.   Ambulating Backwards Walks 20 ft, no assistive devices, good speed, no evidence for imbalance, normal gait   Steps Alternating feet, no rail.   Total Score 28   FGA comment: 28/30                     [facility] Adult PT Treatment/Exercise - 05/22/17 1356      Exercises   Other Exercises  in quadruped focused on open chain hip ABD x 10 reps each side with tactile and verbal cues to maintain hip position and for core stability           [facility]) - 05/22/17 1355    [facility] Step x 12   Comments focus on forward weight shift and hip ABD to complete full step with hands on floor             PT Education - 05/22/17 1704    Education provided Yes   Education Details hip ABD strengthening in quadruped          PT Short Term Goals - 04/18/17 1430      PT SHORT TERM GOAL #1   Title = LTG           PT Long Term Goals - 05/22/17 1709      PT LONG TERM GOAL #1   Title Pt will improve FGA score to 26/30 to reduce risk of falls   Baseline 25/30, 28/30 on 9/26   Time 4   Period Weeks   Status Achieved     PT LONG TERM GOAL #2   Title Pt will improve ankle PF/DF strength to 4+/5 bilaterally throughout   Baseline ankle DF and PF 4- to 4/5 bilaterally; PF 5/5, DF 4+/5 bilaterally on 9/26   Time 4   Period  Weeks   Status Achieved     PT LONG TERM GOAL #3   Title Pt will improve ABC by 5 to  8%   Baseline 90%   Time 4   Period Weeks   Status On-going     PT LONG TERM GOAL #4   Title Pt will be able to stand from kneeling with no UE support   Baseline UE support pushing through top of thigh   Time 4   Period Weeks   Status Partially Met               Plan - 05/22/17 1706    Clinical Impression Statement Initiated re-assessment of LTG with FGA and ankle strength re-assessment.  Pt demonstrates improved balance and decreased falls risk with higher level gait challenges and demonstrates improved ankle strength overall.  Pt continues to demonstrate proximal hip weakness; addressed today with quadruped hip ABD.  Will continue to address and progress.   Rehab Potential Excellent   Clinical Impairments Affecting Rehab Potential chemo induced peripheral neuropathy   PT Frequency Other (comment)  2x week for 4 weeks then 1x week for 4 weeks   PT Duration 8 weeks   PT Treatment/Interventions Gait training;Stair training;Functional mobility training;Therapeutic activities;Therapeutic exercise;Balance training;Neuromuscular re-education;Patient/family education;Energy conservation;ADLs/Self Care Home Management   PT Next Visit Plan finish assessment of LTG/FOTO; D/C?   PT Home Exercise Plan Pt reports [facility] [employer] [facility] of [facility]   Consulted and Agree with Plan of Care Patient      Patient will benefit from skilled therapeutic intervention in order to improve the following deficits and impairments:  Decreased balance, Decreased strength, Impaired sensation, Decreased activity tolerance, Decreased endurance  Visit Diagnosis: Muscle weakness (generalized)  Repeated falls  Foot drop, left  Foot drop, right  Difficulty in walking, not elsewhere classified     Problem List Patient Active Problem List   Diagnosis Date Noted  . Neuropathy due to  chemotherapeutic drug ([place]) 02/12/2017  . Port catheter in place 11/06/2016  . Peripheral neuropathy 09/11/2016  . Atypical pneumonia 06/18/2016  . Acute respiratory failure with hypoxia ([place]) 06/18/2016  . Fever 06/16/2016  . Lactic acidosis 06/16/2016  . Dehydration 06/16/2016  . Encounter for antineoplastic chemotherapy 04/17/2016  . NHL (non-Hodgkin's lymphoma) ([place]) 04/05/2016  . Diffuse large B-cell lymphoma of lymph nodes of multiple regions ([place]) 04/05/2016  . Lung mass 03/13/2016  . Post-operative state 03/08/2014  . Left inguinal hernia 05/08/2013    Raylene Everts, PT, DPT 05/22/17    5:10 PM    [facility] [address] [place], [place], [address] Phone: [phone]   Fax:  [phone]  Name: TIMITHY ARONS MRN: [id number] Date of Birth: 04/07/1936

## 2017-05-29 ENCOUNTER — Ambulatory Visit: Payer: Medicare Other | Admitting: Physical Therapy

## 2017-05-29 DIAGNOSIS — Z23 Encounter for immunization: Secondary | ICD-10-CM | POA: Diagnosis not present

## 2017-05-31 ENCOUNTER — Ambulatory Visit: Payer: Medicare Other | Attending: Neurology | Admitting: Physical Therapy

## 2017-05-31 DIAGNOSIS — M21371 Foot drop, right foot: Secondary | ICD-10-CM | POA: Diagnosis present

## 2017-05-31 DIAGNOSIS — R262 Difficulty in walking, not elsewhere classified: Secondary | ICD-10-CM

## 2017-05-31 DIAGNOSIS — R296 Repeated falls: Secondary | ICD-10-CM

## 2017-05-31 DIAGNOSIS — M21372 Foot drop, left foot: Secondary | ICD-10-CM

## 2017-05-31 DIAGNOSIS — M6281 Muscle weakness (generalized): Secondary | ICD-10-CM

## 2017-05-31 NOTE — Therapy (Signed)
[facility] [address] [place], [place], [address] Phone: [phone]   Fax:  [phone]  Physical Therapy Treatment and D/C Summary  Patient Details  Name: Seth Gilbert MRN: [id number] Date of Birth: 03-05-1936 Referring Provider: Melvenia Beam, MD  Encounter Date: 05/31/2017      PT End of Session - 05/31/17 1449    Visit Number 14   Number of Visits 14   Date for PT Re-Evaluation 05/31/17  D/C today   Authorization Type [employer] code and progress note 10th visit   PT Start Time 1403   PT Stop Time 1445   PT Time Calculation (min) 42 min   Activity Tolerance Patient tolerated treatment well   Behavior During Therapy [facility] for tasks assessed/performed      Past Medical History:  Diagnosis Date  . Cancer ([place])    NHL  . Diverticulitis   . GERD (gastroesophageal reflux disease)    Heartburn at times  . Heart murmur    "comes and goes" onset 4th grade - last time anyone heard it was 01/2014  . History of hiatal hernia   . History of kidney stones   . HOH (hard of hearing)   . Non-Hodgkin lymphoma ([place])   . Peripheral neuropathy 09/11/2016  . PONV (postoperative nausea and vomiting)   . Rosacea   . Wears glasses     Past Surgical History:  Procedure Laterality Date  . APPENDECTOMY     2-3 yrs old  . COLONOSCOPY    . HEMORROIDECTOMY     inflammed anal gland removed  . INGUINAL HERNIA REPAIR Left 02/11/2014   Procedure: LEFT INGUINAL HERNIA REPAIR WITH MESH ;  Surgeon: Joyice Faster. Cornett, MD;  Location: [facility];  Service: General;  Laterality: Left;  . INSERTION OF MESH N/A 02/11/2014   Procedure: INSERTION OF MESH;  Surgeon: Joyice Faster. Cornett, MD;  Location: [facility];  Service: General;  Laterality: N/A;  . PORTACATH PLACEMENT Left 04/10/2016   Procedure: INSERTION PORT-A-CATH into LEFT Subclavian Vein with guidance from ultrasound and  flouroscopy;  Surgeon: Grace Isaac, MD;  Location: [facility];  Service: Thoracic;  Laterality: Left;  . TONSILLECTOMY    . VIDEO BRONCHOSCOPY WITH ENDOBRONCHIAL ULTRASOUND N/A 03/30/2016   Procedure: VIDEO BRONCHOSCOPY WITH ENDOBRONCHIAL ULTRASOUND,WITH TRANSBRONCHIAL BIOPSY;  Surgeon: Grace Isaac, MD;  Location: [facility];  Service: Thoracic;  Laterality: N/A;    There were no vitals filed for this visit.      Subjective Assessment - 05/31/17 1409    Subjective Pt has begun UE, LE and core weight lifting at [facility].  Pt feels that quadruped hip ABD has helped loosen up other areas in his hips and low back.  Pt appreciates new mechanics of floor > stand and has been able to stand with greater ease.     Pertinent History lymphoma with chemo, HOH   Limitations Walking;Other (comment)   Patient Stated Goals Have sufficient strength and balance to stand from kneeling when working on [employer] and performing other household tasks   Currently in Pain? No/denies            [facility] PT Assessment - 05/31/17 1419      Assessment   Medical Diagnosis chemo induced peripheral neuropathy, LE weakness, imbalance   Referring Provider Melvenia Beam, MD   Onset Date/Surgical Date 02/12/17   Hand Dominance Right   Next MD Visit August  2018   Prior Therapy yes     Prior Function   Level of Independence Independent     Observation/Other Assessments   Focus on Therapeutic Outcomes (FOTO)  79   Activities of Balance Confidence Scale (ABC Scale)  79.3%     Transfers   Floor to Transfer 6: Modified independent (Device/Increase time)   Floor to Transfer Details (indicate cue type and reason) kneeling on floor <> stand from each LE: tall kneeling to half kneeling with improved ability to bring each LE fully forwards and stand without use of UE on support surface                          Balance Exercises - 05/31/17 1442      Balance Exercises: Standing   Tandem Gait  Forward;Intermittent upper extremity support;4 reps  with forward gaze and head turns   Other Standing Exercises Walking with eyes closed forwards x 4 reps with one UE support on wall            PT Education - 05/31/17 1448    Education provided Yes   Education Details progress towards goals, tandem gait and gait with eyes closed in hallway   Person(s) Educated Patient   Methods Explanation;Demonstration   Comprehension Verbalized understanding;Returned demonstration          PT Short Term Goals - 04/18/17 1430      PT SHORT TERM GOAL #1   Title = LTG           PT Long Term Goals - 05/31/17 1449      PT LONG TERM GOAL #1   Title Pt will improve FGA score to 26/30 to reduce risk of falls   Baseline 25/30, 28/30 on 9/26   Time 4   Period Weeks   Status Achieved     PT LONG TERM GOAL #2   Title Pt will improve ankle PF/DF strength to 4+/5 bilaterally throughout   Baseline ankle DF and PF 4- to 4/5 bilaterally; PF 5/5, DF 4+/5 bilaterally on 9/26   Time 4   Period Weeks   Status Achieved     PT LONG TERM GOAL #3   Title Pt will improve ABC by 5 to 8%   Baseline 90% increased to 96.3%   Time 4   Period Weeks   Status Achieved     PT LONG TERM GOAL #4   Title Pt will be able to stand from kneeling with no UE support   Time 4   Period Weeks   Status Achieved               Plan - 05/31/17 1450    Clinical Impression Statement Completed assessment of pt progress towards goals; pt has met all goals and was able to demonstrate ability to perform floor <> stand transfers with each LE MOD I without UE support.  Pt also feels he has sufficient tools to continue to progress towards baseline.  Reviewed balance exercises to continue to focus on.  Pt has made excellent progress and is ready for D/C.   Rehab Potential Excellent   Clinical Impairments Affecting Rehab Potential chemo induced peripheral neuropathy   PT Frequency One time visit  to finish assessing  goals   PT Duration Other (comment)  one more visit to finish assessing goals   PT Treatment/Interventions Gait training;Stair training;Functional mobility training;Therapeutic activities;Therapeutic exercise;Balance training;Neuromuscular re-education;Patient/family education;Energy conservation;ADLs/Self Care Home Management  PT Next Visit Plan D/C today   Consulted and Agree with Plan of Care Patient      Patient will benefit from skilled therapeutic intervention in order to improve the following deficits and impairments:  Decreased balance, Decreased strength, Impaired sensation, Decreased activity tolerance, Decreased endurance  Visit Diagnosis: Muscle weakness (generalized)  Repeated falls  Foot drop, left  Foot drop, right  Difficulty in walking, not elsewhere classified       G-Codes - June 04, 2017 1453    Functional Assessment Tool Used (Outpatient Only) FGA 28/30   Functional Limitation Mobility: Walking and moving around   Mobility: Walking and Moving Around Goal Status [phone]) At least 1 percent but less than 20 percent impaired, limited or restricted   Mobility: Walking and Moving Around Discharge Status [id number]) At least 1 percent but less than 20 percent impaired, limited or restricted      Problem List Patient Active Problem List   Diagnosis Date Noted  . Neuropathy due to chemotherapeutic drug ([place]) 02/12/2017  . Port catheter in place 11/06/2016  . Peripheral neuropathy 09/11/2016  . Atypical pneumonia 06/18/2016  . Acute respiratory failure with hypoxia ([place]) 06/18/2016  . Fever 06/16/2016  . Lactic acidosis 06/16/2016  . Dehydration 06/16/2016  . Encounter for antineoplastic chemotherapy 04/17/2016  . NHL (non-Hodgkin's lymphoma) ([place]) 04/05/2016  . Diffuse large B-cell lymphoma of lymph nodes of multiple regions ([place]) 04/05/2016  . Lung mass 03/13/2016  . Post-operative state 03/08/2014  . Left inguinal hernia 05/08/2013    PHYSICAL THERAPY  DISCHARGE SUMMARY  Visits from Start of Care: 14  Current functional level related to goals / functional outcomes: Pt met all LTG   Remaining deficits: LE weakness, impaired balance   Education / Equipment: HEP  Plan: Patient agrees to discharge.  Patient goals were met. Patient is being discharged due to meeting the stated rehab goals.  ?????      Raylene Everts, PT, DPT 06-04-17    2:58 PM    [facility] [address] [place], [place], [address] Phone: [phone]   Fax:  [phone]  Name: OGLE HOEFFNER MRN: [id number] Date of Birth: 1935-12-02

## 2017-06-21 ENCOUNTER — Ambulatory Visit (HOSPITAL_BASED_OUTPATIENT_CLINIC_OR_DEPARTMENT_OTHER): Payer: Medicare Other

## 2017-06-21 DIAGNOSIS — C8338 Diffuse large B-cell lymphoma, lymph nodes of multiple sites: Secondary | ICD-10-CM | POA: Diagnosis not present

## 2017-06-21 DIAGNOSIS — Z452 Encounter for adjustment and management of vascular access device: Secondary | ICD-10-CM

## 2017-06-21 DIAGNOSIS — Z95828 Presence of other vascular implants and grafts: Secondary | ICD-10-CM

## 2017-06-21 MED ORDER — SODIUM CHLORIDE 0.9% FLUSH
10.0000 mL | INTRAVENOUS | Status: DC | PRN
  Administered 2017-06-21: 10 mL via INTRAVENOUS
  Filled 2017-06-21: qty 10

## 2017-06-21 MED ORDER — HEPARIN SOD (PORK) LOCK FLUSH 100 UNIT/ML IV SOLN
500.0000 [IU] | Freq: Once | INTRAVENOUS | Status: AC | PRN
  Administered 2017-06-21: 500 [IU] via INTRAVENOUS
  Filled 2017-06-21: qty 5

## 2017-06-28 DIAGNOSIS — N644 Mastodynia: Secondary | ICD-10-CM | POA: Diagnosis not present

## 2017-06-28 DIAGNOSIS — E039 Hypothyroidism, unspecified: Secondary | ICD-10-CM | POA: Diagnosis not present

## 2017-06-28 DIAGNOSIS — I700 Atherosclerosis of aorta: Secondary | ICD-10-CM | POA: Diagnosis not present

## 2017-07-03 DIAGNOSIS — E7800 Pure hypercholesterolemia, unspecified: Secondary | ICD-10-CM | POA: Diagnosis not present

## 2017-07-03 DIAGNOSIS — Z125 Encounter for screening for malignant neoplasm of prostate: Secondary | ICD-10-CM | POA: Diagnosis not present

## 2017-07-03 DIAGNOSIS — E039 Hypothyroidism, unspecified: Secondary | ICD-10-CM | POA: Diagnosis not present

## 2017-07-03 DIAGNOSIS — N644 Mastodynia: Secondary | ICD-10-CM | POA: Diagnosis not present

## 2017-07-25 DIAGNOSIS — R509 Fever, unspecified: Secondary | ICD-10-CM | POA: Diagnosis not present

## 2017-08-02 ENCOUNTER — Ambulatory Visit (HOSPITAL_BASED_OUTPATIENT_CLINIC_OR_DEPARTMENT_OTHER): Payer: Medicare Other

## 2017-08-02 DIAGNOSIS — Z452 Encounter for adjustment and management of vascular access device: Secondary | ICD-10-CM

## 2017-08-02 DIAGNOSIS — C8338 Diffuse large B-cell lymphoma, lymph nodes of multiple sites: Secondary | ICD-10-CM

## 2017-08-02 DIAGNOSIS — Z95828 Presence of other vascular implants and grafts: Secondary | ICD-10-CM

## 2017-08-02 MED ORDER — SODIUM CHLORIDE 0.9% FLUSH
10.0000 mL | INTRAVENOUS | Status: DC | PRN
  Administered 2017-08-02: 10 mL via INTRAVENOUS
  Filled 2017-08-02: qty 10

## 2017-08-02 MED ORDER — HEPARIN SOD (PORK) LOCK FLUSH 100 UNIT/ML IV SOLN
500.0000 [IU] | Freq: Once | INTRAVENOUS | Status: AC | PRN
  Administered 2017-08-02: 500 [IU] via INTRAVENOUS
  Filled 2017-08-02: qty 5

## 2017-09-01 DIAGNOSIS — B029 Zoster without complications: Secondary | ICD-10-CM | POA: Diagnosis not present

## 2017-09-11 ENCOUNTER — Inpatient Hospital Stay: Payer: Medicare Other

## 2017-09-11 ENCOUNTER — Inpatient Hospital Stay: Payer: Medicare Other | Attending: Internal Medicine | Admitting: Oncology

## 2017-09-11 ENCOUNTER — Other Ambulatory Visit: Payer: Medicare Other

## 2017-09-11 ENCOUNTER — Telehealth: Payer: Self-pay | Admitting: Oncology

## 2017-09-11 ENCOUNTER — Encounter: Payer: Self-pay | Admitting: Oncology

## 2017-09-11 VITALS — BP 155/84 | HR 94 | Temp 97.7°F | Resp 18 | Ht 68.0 in | Wt 196.1 lb

## 2017-09-11 DIAGNOSIS — C8338 Diffuse large B-cell lymphoma, lymph nodes of multiple sites: Secondary | ICD-10-CM | POA: Diagnosis not present

## 2017-09-11 DIAGNOSIS — Z79899 Other long term (current) drug therapy: Secondary | ICD-10-CM | POA: Diagnosis not present

## 2017-09-11 DIAGNOSIS — Z87442 Personal history of urinary calculi: Secondary | ICD-10-CM | POA: Diagnosis not present

## 2017-09-11 DIAGNOSIS — K219 Gastro-esophageal reflux disease without esophagitis: Secondary | ICD-10-CM

## 2017-09-11 DIAGNOSIS — Z9221 Personal history of antineoplastic chemotherapy: Secondary | ICD-10-CM | POA: Insufficient documentation

## 2017-09-11 DIAGNOSIS — Z95828 Presence of other vascular implants and grafts: Secondary | ICD-10-CM

## 2017-09-11 DIAGNOSIS — B029 Zoster without complications: Secondary | ICD-10-CM | POA: Diagnosis not present

## 2017-09-11 DIAGNOSIS — Z9225 Personal history of immunosupression therapy: Secondary | ICD-10-CM

## 2017-09-11 LAB — COMPREHENSIVE METABOLIC PANEL
ALBUMIN: 3.6 g/dL (ref 3.5–5.0)
ALT: 17 U/L (ref 0–55)
ANION GAP: 9 (ref 3–11)
AST: 19 U/L (ref 5–34)
Alkaline Phosphatase: 81 U/L (ref 40–150)
BUN: 17 mg/dL (ref 7–26)
CHLORIDE: 107 mmol/L (ref 98–109)
CO2: 25 mmol/L (ref 22–29)
Calcium: 9 mg/dL (ref 8.4–10.4)
Creatinine, Ser: 0.98 mg/dL (ref 0.70–1.30)
GFR calc Af Amer: >60 mL/min (ref >60)
GFR calc non Af Amer: >60 mL/min (ref >60)
Glucose, Bld: 124 mg/dL (ref 70–140)
POTASSIUM: 4.3 mmol/L (ref 3.5–5.1)
SODIUM: 141 mmol/L (ref 136–145)
TOTAL PROTEIN: 6.3 g/dL — AB (ref 6.4–8.3)
Total Bilirubin: 0.5 mg/dL (ref 0.2–1.2)

## 2017-09-11 LAB — CBC WITH DIFFERENTIAL/PLATELET
BASOS ABS: 0 10*3/uL (ref 0.0–0.1)
BASOS PCT: 1 %
EOS ABS: 0.1 10*3/uL (ref 0.0–0.5)
Eosinophils Relative: 1 %
HCT: 40.2 % (ref 38.4–49.9)
Hemoglobin: 13.4 g/dL (ref 13.0–17.1)
Lymphocytes Relative: 21 %
Lymphs Abs: 1.4 10*3/uL (ref 0.9–3.3)
MCH: 33.1 pg (ref 27.2–33.4)
MCHC: 33.3 g/dL (ref 32.0–36.0)
MCV: 99.4 fL — ABNORMAL HIGH (ref 79.3–98.0)
Monocytes Absolute: 0.8 10*3/uL (ref 0.1–0.9)
Monocytes Relative: 12 %
Neutro Abs: 4.5 10*3/uL (ref 1.5–6.5)
Neutrophils Relative %: 65 %
PLATELETS: 201 10*3/uL (ref 140–400)
RBC: 4.04 MIL/uL — ABNORMAL LOW (ref 4.20–5.82)
RDW: 14.4 % (ref 11.0–15.6)
WBC: 6.8 10*3/uL (ref 4.0–10.3)

## 2017-09-11 LAB — LACTATE DEHYDROGENASE: LDH: 183 U/L (ref 125–245)

## 2017-09-11 MED ORDER — HEPARIN SOD (PORK) LOCK FLUSH 100 UNIT/ML IV SOLN
500.0000 [IU] | Freq: Once | INTRAVENOUS | Status: AC | PRN
  Administered 2017-09-11: 500 [IU] via INTRAVENOUS
  Filled 2017-09-11: qty 5

## 2017-09-11 MED ORDER — LIDOCAINE-PRILOCAINE 2.5-2.5 % EX CREA: 1.0000 "application " | TOPICAL_CREAM | CUTANEOUS | 2 refills | Status: DC | PRN

## 2017-09-11 MED ORDER — SODIUM CHLORIDE 0.9% FLUSH
10.0000 mL | INTRAVENOUS | Status: DC | PRN
  Administered 2017-09-11: 10 mL via INTRAVENOUS
  Filled 2017-09-11: qty 10

## 2017-09-11 NOTE — Telephone Encounter (Signed)
Scheduled appt per 1/16 los - sent reminder letter in the mail with appt date and time.

## 2017-09-11 NOTE — Progress Notes (Signed)
[place] OFFICE PROGRESS NOTE  Lawerance Cruel, MD [place] [address]  DIAGNOSIS: Stage II large B-cell non-Hodgkin lymphoma diagnosed in August 2017.  PRIOR THERAPY: Systemic chemotherapy with CHOP/Rituxan every 3 weeks with Neulasta support, status post 6 cycles.  Last dose was given on 08/14/2016.  CURRENT THERAPY: Observation  INTERVAL HISTORY: Seth Gilbert 82 y.o. male returns for routine follow-up visit by himself.  The patient is feeling fine today and has no specific complaints except for intermittent neuropathic pain to his left shoulder secondary to recent shingles.  He has recently completed a course of valacyclovir with improvement in his rash and neuropathy.  He does report intermittent sensitivity in his nipples which is been ongoing.  He denies fevers and chills.  Denies chest pain, shortness breath, cough, hemoptysis.  Denies nausea, vomiting, constipation, diarrhea.  He has not had no significant weight loss or night sweats.  The patient is here for evaluation and repeat lab work.  MEDICAL HISTORY: Past Medical History:  Diagnosis Date  . Cancer ([place])    NHL  . Diverticulitis   . GERD (gastroesophageal reflux disease)    Heartburn at times  . Heart murmur    "comes and goes" onset 4th grade - last time anyone heard it was 01/2014  . History of hiatal hernia   . History of kidney stones   . HOH (hard of hearing)   . Non-Hodgkin lymphoma ([place])   . Peripheral neuropathy 09/11/2016  . PONV (postoperative nausea and vomiting)   . Rosacea   . Wears glasses     ALLERGIES:  is allergic to no known allergies.  MEDICATIONS:  Current Outpatient Medications  Medication Sig Dispense Refill  . famotidine (PEPCID) 40 MG tablet Take 40 mg by mouth daily as needed for heartburn.     . Glucosamine HCl (GLUCOSAMINE PO) Take 1,800 mg by mouth 3 (three) times daily.    . metroNIDAZOLE (METROGEL) 0.75 % gel Apply 1 application topically  daily.     . Multiple Vitamins-Minerals (CENTRUM SILVER PO) Take 1 tablet by mouth daily.     Marland Kitchen senna (SENOKOT) 8.6 MG tablet Take 1 tablet by mouth daily as needed for constipation.    . vitamin E 400 UNIT capsule Take 400 Units by mouth daily.    . ALPHA LIPOIC ACID PO Take 600 mg by mouth daily.    Marland Kitchen ampicillin (PRINCIPEN) 500 MG capsule Take 500 mg by mouth every other day.     . levothyroxine (SYNTHROID, LEVOTHROID) 25 MCG tablet 5 mcg.   0  . lidocaine-prilocaine (EMLA) cream Apply 1 application topically as needed. 30 g 2   No current facility-administered medications for this visit.     SURGICAL HISTORY:  Past Surgical History:  Procedure Laterality Date  . APPENDECTOMY     2-3 yrs old  . COLONOSCOPY    . HEMORROIDECTOMY     inflammed anal gland removed  . INGUINAL HERNIA REPAIR Left 02/11/2014   Procedure: LEFT INGUINAL HERNIA REPAIR WITH MESH ;  Surgeon: Joyice Faster. Cornett, MD;  Location: [facility];  Service: General;  Laterality: Left;  . INSERTION OF MESH N/A 02/11/2014   Procedure: INSERTION OF MESH;  Surgeon: Joyice Faster. Cornett, MD;  Location: [facility];  Service: General;  Laterality: N/A;  . PORTACATH PLACEMENT Left 04/10/2016   Procedure: INSERTION PORT-A-CATH into LEFT Subclavian Vein with guidance from ultrasound and flouroscopy;  Surgeon: Grace Isaac,  MD;  Location: [facility];  Service: Thoracic;  Laterality: Left;  . TONSILLECTOMY    . VIDEO BRONCHOSCOPY WITH ENDOBRONCHIAL ULTRASOUND N/A 03/30/2016   Procedure: VIDEO BRONCHOSCOPY WITH ENDOBRONCHIAL ULTRASOUND,WITH TRANSBRONCHIAL BIOPSY;  Surgeon: Grace Isaac, MD;  Location: [facility];  Service: Thoracic;  Laterality: N/A;    REVIEW OF SYSTEMS:   Review of Systems  Constitutional: Negative for appetite change, chills, fatigue, fever and unexpected weight change.  HENT:   Negative for mouth sores, nosebleeds, sore throat and trouble swallowing.   Eyes: Negative for eye problems and  icterus.  Respiratory: Negative for cough, hemoptysis, shortness of breath and wheezing.   Cardiovascular: Negative for chest pain and leg swelling.  Gastrointestinal: Negative for abdominal pain, constipation, diarrhea, nausea and vomiting.  Genitourinary: Negative for bladder incontinence, difficulty urinating, dysuria, frequency and hematuria.   Musculoskeletal: Negative for back pain, gait problem, neck pain and neck stiffness.  Skin: Negative for rash. Positive for rash to his left shoulder secondary to shingles.  Rash is improving and drying up. Neurological: Negative for dizziness, extremity weakness, gait problem, headaches, light-headedness and seizures.  Hematological: Negative for adenopathy. Does not bruise/bleed easily.  Psychiatric/Behavioral: Negative for confusion, depression and sleep disturbance. The patient is not nervous/anxious.     PHYSICAL EXAMINATION:  Blood pressure (!) 155/84, pulse 94, temperature 97.7 F (36.5 C), temperature source Oral, resp. rate 18, height 5\' 8"  (1.727 m), weight 196 lb 1.6 oz (89 kg), SpO2 100 %.  ECOG PERFORMANCE STATUS: 1 - Symptomatic but completely ambulatory  Physical Exam  Constitutional: Oriented to person, place, and time and well-developed, well-nourished, and in no distress. No distress.  HENT:  Head: Normocephalic and atraumatic.  Mouth/Throat: Oropharynx is clear and moist. No oropharyngeal exudate.  Eyes: Conjunctivae are normal. Right eye exhibits no discharge. Left eye exhibits no discharge. No scleral icterus.  Neck: Normal range of motion. Neck supple.  Cardiovascular: Normal rate, regular rhythm, normal heart sounds and intact distal pulses.   Pulmonary/Chest: Effort normal and breath sounds normal. No respiratory distress. No wheezes. No rales.  Abdominal: Soft. Bowel sounds are normal. Exhibits no distension and no mass. There is no tenderness.  Musculoskeletal: Normal range of motion. Exhibits no edema.   Lymphadenopathy:    No cervical adenopathy.  Neurological: Alert and oriented to person, place, and time. Exhibits normal muscle tone. Gait normal. Coordination normal.  Skin: Skin is warm and dry.  Dried vesicular lesions to his left shoulder and posterior neck.  No drainage.  Not diaphoretic. No erythema. No pallor.  Psychiatric: Mood, memory and judgment normal.  Vitals reviewed.  LABORATORY DATA: Lab Results  Component Value Date   WBC 6.8 09/11/2017   HGB 13.4 09/11/2017   HCT 40.2 09/11/2017   MCV 99.4 (H) 09/11/2017   PLT 201 09/11/2017      Chemistry      Component Value Date/Time   NA 141 09/11/2017 1410   NA 140 02/26/2017 1326   K 4.3 09/11/2017 1410   K 4.3 02/26/2017 1326   CL 107 09/11/2017 1410   CO2 25 09/11/2017 1410   CO2 26 02/26/2017 1326   BUN 17 09/11/2017 1410   BUN 15.7 02/26/2017 1326   CREATININE 0.98 09/11/2017 1410   CREATININE 0.9 02/26/2017 1326      Component Value Date/Time   CALCIUM 9.0 09/11/2017 1410   CALCIUM 9.2 02/26/2017 1326   ALKPHOS 81 09/11/2017 1410   ALKPHOS 74 02/26/2017 1326   AST 19 09/11/2017 1410  AST 20 02/26/2017 1326   ALT 17 09/11/2017 1410   ALT 15 02/26/2017 1326   BILITOT 0.5 09/11/2017 1410   BILITOT 0.47 02/26/2017 1326       RADIOGRAPHIC STUDIES:  No results found.   ASSESSMENT/PLAN:  Diffuse large B-cell lymphoma of lymph nodes of multiple regions [facility]) This is a very pleasant 82 year old white male with stage II large B-cell non-Hodgkin lymphoma diagnosed in August 2017. He is status post 6 cycles of systemic chemotherapy with CHOP/Rituxan. He tolerated his treatment fairly well except for chemotherapy-induced pancytopenia and weakness.  He is currently on observation.  He had lab work performed recently.  The patient was seen with Dr. Julien Nordmann.  The patient does not have any symptoms relatable to his lymphoma.  He has no changes in his lab work to indicate any evidence of recurrent disease.   Recommend that he continue observation. The patient will follow-up in 6 months with repeat lab work and a CT scan.  He was advised to call immediately if he has any concerning symptoms in the interval. The patient voices understanding of current disease status and treatment options and is in agreement with the current care plan. All questions were answered. The patient knows to call the clinic with any problems, questions or concerns. We can certainly see the patient much sooner if necessary.  Orders Placed This Encounter  Procedures  . CT ABDOMEN PELVIS W CONTRAST    Standing Status:   Future    Standing Expiration Date:   09/11/2018    Order Specific Question:   If indicated for the ordered procedure, I authorize the administration of contrast media per Radiology protocol    Answer:   Yes    Order Specific Question:   Preferred imaging location?    Answer:   [facility]    Order Specific Question:   Radiology Contrast Protocol - do NOT remove file path    Answer:   \\[email or web address]    Order Specific Question:   Reason for Exam additional comments    Answer:   NHL. Restaging.  . CT CHEST W CONTRAST    Standing Status:   Future    Standing Expiration Date:   09/11/2018    Order Specific Question:   If indicated for the ordered procedure, I authorize the administration of contrast media per Radiology protocol    Answer:   Yes    Order Specific Question:   Preferred imaging location?    Answer:   [facility]    Order Specific Question:   Call Results- Best Contact Number?    Answer:   [id number]    Order Specific Question:   Radiology Contrast Protocol - do NOT remove file path    Answer:   \\[email or web address]    Order Specific Question:   Reason for Exam additional comments    Answer:   NHL. Restaging.  Marland Kitchen CBC with Differential (Cancer Center Only)    Standing Status:    Future    Standing Expiration Date:   09/11/2018  . CMP ([facility] only)    Standing Status:   Future    Standing Expiration Date:   09/11/2018  . Lactate dehydrogenase    Standing Status:   Future    Standing Expiration Date:   09/11/2018     Mikey Bussing, DNP, AGPCNP-BC, AOCNP 09/12/17  ADDENDUM: Hematology/Oncology Attending: I had a face-to-face encounter with the patient.  I recommended his care plan.  This is  a very pleasant 82 years old white male with history of stage II large B cell non-Hodgkin lymphoma diagnosed in August 2017 status post 6 cycles of systemic chemotherapy with CHOP/Rituxan with complete response. The patient is currently on observation.  He is here today for evaluation with repeat blood work.  There is no concerning findings on his blood work today. I recommended for the patient to continue on observation with repeat CBC, comprehensive metabolic panel, LDH and CT scan of the chest, abdomen and pelvis in 6 months. The patient was advised to call immediately if he has any concerning symptoms in the interval.  Disclaimer: This note was dictated with voice recognition software. Similar sounding words can inadvertently be transcribed and may be missed upon review. Eilleen Kempf, MD 09/13/17

## 2017-09-12 NOTE — Assessment & Plan Note (Signed)
This is a very pleasant 82 year old white male with stage II large B-cell non-Hodgkin lymphoma diagnosed in August 2017. He is status post 6 cycles of systemic chemotherapy with CHOP/Rituxan. He tolerated his treatment fairly well except for chemotherapy-induced pancytopenia and weakness.  He is currently on observation.  He had lab work performed recently.  The patient was seen with Dr. Julien Nordmann.  The patient does not have any symptoms relatable to his lymphoma.  He has no changes in his lab work to indicate any evidence of recurrent disease.  Recommend that he continue observation. The patient will follow-up in 6 months with repeat lab work and a CT scan.  He was advised to call immediately if he has any concerning symptoms in the interval. The patient voices understanding of current disease status and treatment options and is in agreement with the current care plan. All questions were answered. The patient knows to call the clinic with any problems, questions or concerns. We can certainly see the patient much sooner if necessary.

## 2017-09-25 ENCOUNTER — Encounter: Payer: Self-pay | Admitting: Oncology

## 2017-09-25 NOTE — Progress Notes (Signed)
Received PA request for Lidocaine-Prilocaine cream in box.  Submitted via Cover My Meds:  Seth Gilbert (Key: [id number])   Your information has been submitted to [place]. [employer] [place] will review the request and notify you of the determination decision directly, typically within 3 business days of your submission and once all necessary information is received. You will also receive your request decision electronically. To check for an update later, open the request again from your dashboard. If [employer] has not responded within the specified timeframe or if you have

## 2017-09-26 ENCOUNTER — Encounter: Payer: Self-pay | Admitting: Internal Medicine

## 2017-09-26 NOTE — Progress Notes (Signed)
Received voicemail from [employer](Brenda) w/ PA determination for Lidocaine-Prilocaine cream.  PA approved 09/25/17-09/25/18.  [facility](Holly) to [employer] of approval. She states it went through.

## 2017-11-08 ENCOUNTER — Inpatient Hospital Stay: Payer: Medicare Other | Attending: Internal Medicine

## 2017-11-08 DIAGNOSIS — C8338 Diffuse large B-cell lymphoma, lymph nodes of multiple sites: Secondary | ICD-10-CM | POA: Diagnosis not present

## 2017-11-08 DIAGNOSIS — Z95828 Presence of other vascular implants and grafts: Secondary | ICD-10-CM

## 2017-11-08 DIAGNOSIS — Z452 Encounter for adjustment and management of vascular access device: Secondary | ICD-10-CM | POA: Diagnosis not present

## 2017-11-08 MED ORDER — SODIUM CHLORIDE 0.9% FLUSH
10.0000 mL | INTRAVENOUS | Status: DC | PRN
  Administered 2017-11-08: 10 mL via INTRAVENOUS
  Filled 2017-11-08: qty 10

## 2017-11-08 MED ORDER — HEPARIN SOD (PORK) LOCK FLUSH 100 UNIT/ML IV SOLN
500.0000 [IU] | Freq: Once | INTRAVENOUS | Status: AC | PRN
  Administered 2017-11-08: 500 [IU] via INTRAVENOUS
  Filled 2017-11-08: qty 5

## 2017-11-25 DIAGNOSIS — E039 Hypothyroidism, unspecified: Secondary | ICD-10-CM | POA: Diagnosis not present

## 2017-11-25 DIAGNOSIS — N644 Mastodynia: Secondary | ICD-10-CM | POA: Diagnosis not present

## 2017-11-25 DIAGNOSIS — E7800 Pure hypercholesterolemia, unspecified: Secondary | ICD-10-CM | POA: Diagnosis not present

## 2017-11-25 DIAGNOSIS — R509 Fever, unspecified: Secondary | ICD-10-CM | POA: Diagnosis not present

## 2017-11-27 DIAGNOSIS — E039 Hypothyroidism, unspecified: Secondary | ICD-10-CM | POA: Diagnosis not present

## 2017-11-27 DIAGNOSIS — C8590 Non-Hodgkin lymphoma, unspecified, unspecified site: Secondary | ICD-10-CM | POA: Diagnosis not present

## 2017-11-27 DIAGNOSIS — I700 Atherosclerosis of aorta: Secondary | ICD-10-CM | POA: Diagnosis not present

## 2017-11-27 DIAGNOSIS — Z0000 Encounter for general adult medical examination without abnormal findings: Secondary | ICD-10-CM | POA: Diagnosis not present

## 2017-12-11 DIAGNOSIS — R948 Abnormal results of function studies of other organs and systems: Secondary | ICD-10-CM | POA: Diagnosis not present

## 2018-01-08 ENCOUNTER — Inpatient Hospital Stay: Payer: Medicare Other | Attending: Internal Medicine

## 2018-01-08 DIAGNOSIS — Z452 Encounter for adjustment and management of vascular access device: Secondary | ICD-10-CM | POA: Insufficient documentation

## 2018-01-08 DIAGNOSIS — Z95828 Presence of other vascular implants and grafts: Secondary | ICD-10-CM

## 2018-01-08 DIAGNOSIS — C8338 Diffuse large B-cell lymphoma, lymph nodes of multiple sites: Secondary | ICD-10-CM | POA: Diagnosis not present

## 2018-01-08 MED ORDER — HEPARIN SOD (PORK) LOCK FLUSH 100 UNIT/ML IV SOLN
500.0000 [IU] | Freq: Once | INTRAVENOUS | Status: AC | PRN
  Administered 2018-01-08: 500 [IU] via INTRAVENOUS
  Filled 2018-01-08: qty 5

## 2018-01-08 MED ORDER — SODIUM CHLORIDE 0.9% FLUSH
10.0000 mL | INTRAVENOUS | Status: DC | PRN
  Administered 2018-01-08: 10 mL via INTRAVENOUS
  Filled 2018-01-08: qty 10

## 2018-01-31 DIAGNOSIS — K5730 Diverticulosis of large intestine without perforation or abscess without bleeding: Secondary | ICD-10-CM | POA: Diagnosis not present

## 2018-01-31 DIAGNOSIS — K648 Other hemorrhoids: Secondary | ICD-10-CM | POA: Diagnosis not present

## 2018-01-31 DIAGNOSIS — R933 Abnormal findings on diagnostic imaging of other parts of digestive tract: Secondary | ICD-10-CM | POA: Diagnosis not present

## 2018-03-06 DIAGNOSIS — L821 Other seborrheic keratosis: Secondary | ICD-10-CM | POA: Diagnosis not present

## 2018-03-06 DIAGNOSIS — L719 Rosacea, unspecified: Secondary | ICD-10-CM | POA: Diagnosis not present

## 2018-03-06 DIAGNOSIS — L570 Actinic keratosis: Secondary | ICD-10-CM | POA: Diagnosis not present

## 2018-03-10 ENCOUNTER — Inpatient Hospital Stay: Payer: Medicare Other

## 2018-03-10 ENCOUNTER — Inpatient Hospital Stay: Payer: Medicare Other | Attending: Internal Medicine

## 2018-03-10 DIAGNOSIS — D61810 Antineoplastic chemotherapy induced pancytopenia: Secondary | ICD-10-CM | POA: Diagnosis not present

## 2018-03-10 DIAGNOSIS — N62 Hypertrophy of breast: Secondary | ICD-10-CM | POA: Insufficient documentation

## 2018-03-10 DIAGNOSIS — Z79899 Other long term (current) drug therapy: Secondary | ICD-10-CM | POA: Diagnosis not present

## 2018-03-10 DIAGNOSIS — T451X5S Adverse effect of antineoplastic and immunosuppressive drugs, sequela: Secondary | ICD-10-CM | POA: Diagnosis not present

## 2018-03-10 DIAGNOSIS — C8338 Diffuse large B-cell lymphoma, lymph nodes of multiple sites: Secondary | ICD-10-CM | POA: Diagnosis not present

## 2018-03-10 DIAGNOSIS — R531 Weakness: Secondary | ICD-10-CM | POA: Insufficient documentation

## 2018-03-10 DIAGNOSIS — G629 Polyneuropathy, unspecified: Secondary | ICD-10-CM | POA: Insufficient documentation

## 2018-03-10 DIAGNOSIS — Z9225 Personal history of immunosupression therapy: Secondary | ICD-10-CM | POA: Insufficient documentation

## 2018-03-10 DIAGNOSIS — Z9221 Personal history of antineoplastic chemotherapy: Secondary | ICD-10-CM | POA: Diagnosis not present

## 2018-03-10 DIAGNOSIS — K219 Gastro-esophageal reflux disease without esophagitis: Secondary | ICD-10-CM | POA: Diagnosis not present

## 2018-03-10 DIAGNOSIS — Z452 Encounter for adjustment and management of vascular access device: Secondary | ICD-10-CM | POA: Diagnosis not present

## 2018-03-10 DIAGNOSIS — Z87442 Personal history of urinary calculi: Secondary | ICD-10-CM | POA: Insufficient documentation

## 2018-03-10 DIAGNOSIS — Z95828 Presence of other vascular implants and grafts: Secondary | ICD-10-CM

## 2018-03-10 LAB — CMP (CANCER CENTER ONLY)
ALT: 16 U/L (ref 0–44)
AST: 17 U/L (ref 15–41)
Albumin: 3.9 g/dL (ref 3.5–5.0)
Alkaline Phosphatase: 72 U/L (ref 38–126)
Anion gap: 7 (ref 5–15)
BUN: 16 mg/dL (ref 8–23)
CHLORIDE: 107 mmol/L (ref 98–111)
CO2: 26 mmol/L (ref 22–32)
CREATININE: 1.1 mg/dL (ref 0.61–1.24)
Calcium: 9.1 mg/dL (ref 8.9–10.3)
GFR, Est AFR Am: >60 mL/min (ref >60)
GFR, Estimated: >60 mL/min (ref >60)
Glucose, Bld: 121 mg/dL — ABNORMAL HIGH (ref 70–99)
Potassium: 4.3 mmol/L (ref 3.5–5.1)
Sodium: 140 mmol/L (ref 135–145)
Total Bilirubin: 0.5 mg/dL (ref 0.3–1.2)
Total Protein: 6.2 g/dL — ABNORMAL LOW (ref 6.5–8.1)

## 2018-03-10 LAB — CBC WITH DIFFERENTIAL (CANCER CENTER ONLY)
Basophils Absolute: 0 10*3/uL (ref 0.0–0.1)
Basophils Relative: 0 %
EOS ABS: 0.1 10*3/uL (ref 0.0–0.5)
EOS PCT: 1 %
HCT: 39.8 % (ref 38.4–49.9)
Hemoglobin: 13.6 g/dL (ref 13.0–17.1)
LYMPHS ABS: 1.3 10*3/uL (ref 0.9–3.3)
Lymphocytes Relative: 22 %
MCH: 33.7 pg — AB (ref 27.2–33.4)
MCHC: 34.2 g/dL (ref 32.0–36.0)
MCV: 98.8 fL — ABNORMAL HIGH (ref 79.3–98.0)
Monocytes Absolute: 0.5 10*3/uL (ref 0.1–0.9)
Monocytes Relative: 8 %
Neutro Abs: 4 10*3/uL (ref 1.5–6.5)
Neutrophils Relative %: 69 %
Platelet Count: 160 10*3/uL (ref 140–400)
RBC: 4.03 MIL/uL — ABNORMAL LOW (ref 4.20–5.82)
RDW: 13.9 % (ref 11.0–14.6)
WBC Count: 5.9 10*3/uL (ref 4.0–10.3)

## 2018-03-10 LAB — LACTATE DEHYDROGENASE: LDH: 168 U/L (ref 98–192)

## 2018-03-10 MED ORDER — SODIUM CHLORIDE 0.9% FLUSH
10.0000 mL | INTRAVENOUS | Status: DC | PRN
  Administered 2018-03-10: 10 mL via INTRAVENOUS
  Filled 2018-03-10: qty 10

## 2018-03-10 MED ORDER — HEPARIN SOD (PORK) LOCK FLUSH 100 UNIT/ML IV SOLN
500.0000 [IU] | Freq: Once | INTRAVENOUS | Status: AC | PRN
  Administered 2018-03-10: 500 [IU] via INTRAVENOUS
  Filled 2018-03-10: qty 5

## 2018-03-10 NOTE — Patient Instructions (Signed)
Implanted Port Home Guide An implanted port is a type of central line that is placed under the skin. Central lines are used to provide IV access when treatment or nutrition needs to be given through a person's veins. Implanted ports are used for long-term IV access. An implanted port may be placed because:  You need IV medicine that would be irritating to the small veins in your hands or arms.  You need long-term IV medicines, such as antibiotics.  You need IV nutrition for a long period.  You need frequent blood draws for lab tests.  You need dialysis.  Implanted ports are usually placed in the chest area, but they can also be placed in the upper arm, the abdomen, or the leg. An implanted port has two main parts:  Reservoir. The reservoir is round and will appear as a small, raised area under your skin. The reservoir is the part where a needle is inserted to give medicines or draw blood.  Catheter. The catheter is a thin, flexible tube that extends from the reservoir. The catheter is placed into a large vein. Medicine that is inserted into the reservoir goes into the catheter and then into the vein.  How will I care for my incision site? Do not get the incision site wet. Bathe or shower as directed by your health care provider. How is my port accessed? Special steps must be taken to access the port:  Before the port is accessed, a numbing cream can be placed on the skin. This helps numb the skin over the port site.  Your health care provider uses a sterile technique to access the port. ? Your health care provider must put on a mask and sterile gloves. ? The skin over your port is cleaned carefully with an antiseptic and allowed to dry. ? The port is gently pinched between sterile gloves, and a needle is inserted into the port.  Only "non-coring" port needles should be used to access the port. Once the port is accessed, a blood return should be checked. This helps ensure that the port  is in the vein and is not clogged.  If your port needs to remain accessed for a constant infusion, a clear (transparent) bandage will be placed over the needle site. The bandage and needle will need to be changed every week, or as directed by your health care provider.  Keep the bandage covering the needle clean and dry. Do not get it wet. Follow your health care provider's instructions on how to take a shower or bath while the port is accessed.  If your port does not need to stay accessed, no bandage is needed over the port.  What is flushing? Flushing helps keep the port from getting clogged. Follow your health care provider's instructions on how and when to flush the port. Ports are usually flushed with saline solution or a medicine called heparin. The need for flushing will depend on how the port is used.  If the port is used for intermittent medicines or blood draws, the port will need to be flushed: ? After medicines have been given. ? After blood has been drawn. ? As part of routine maintenance.  If a constant infusion is running, the port may not need to be flushed.  How long will my port stay implanted? The port can stay in for as long as your health care provider thinks it is needed. When it is time for the port to come out, surgery will be   done to remove it. The procedure is similar to the one performed when the port was put in. When should I seek immediate medical care? When you have an implanted port, you should seek immediate medical care if:  You notice a bad smell coming from the incision site.  You have swelling, redness, or drainage at the incision site.  You have more swelling or pain at the port site or the surrounding area.  You have a fever that is not controlled with medicine.  This information is not intended to replace advice given to you by your health care provider. Make sure you discuss any questions you have with your health care provider. Document  Released: 08/13/2005 Document Revised: 01/19/2016 Document Reviewed: 04/20/2013 Elsevier Interactive Patient Education  2017 [employer]  

## 2018-03-11 ENCOUNTER — Encounter (HOSPITAL_COMMUNITY): Payer: Self-pay

## 2018-03-11 ENCOUNTER — Ambulatory Visit (HOSPITAL_COMMUNITY)
Admission: RE | Admit: 2018-03-11 | Discharge: 2018-03-11 | Disposition: A | Discharge status: Home / Self Care | Payer: Medicare Other | Source: Ambulatory Visit | Attending: Oncology | Admitting: Oncology

## 2018-03-11 DIAGNOSIS — K5730 Diverticulosis of large intestine without perforation or abscess without bleeding: Secondary | ICD-10-CM | POA: Insufficient documentation

## 2018-03-11 DIAGNOSIS — I2510 Atherosclerotic heart disease of native coronary artery without angina pectoris: Secondary | ICD-10-CM | POA: Insufficient documentation

## 2018-03-11 DIAGNOSIS — C8338 Diffuse large B-cell lymphoma, lymph nodes of multiple sites: Secondary | ICD-10-CM | POA: Diagnosis not present

## 2018-03-11 DIAGNOSIS — K449 Diaphragmatic hernia without obstruction or gangrene: Secondary | ICD-10-CM | POA: Diagnosis not present

## 2018-03-11 DIAGNOSIS — N200 Calculus of kidney: Secondary | ICD-10-CM | POA: Diagnosis not present

## 2018-03-11 DIAGNOSIS — N400 Enlarged prostate without lower urinary tract symptoms: Secondary | ICD-10-CM | POA: Insufficient documentation

## 2018-03-11 DIAGNOSIS — I313 Pericardial effusion (noninflammatory): Secondary | ICD-10-CM | POA: Insufficient documentation

## 2018-03-11 DIAGNOSIS — I700 Atherosclerosis of aorta: Secondary | ICD-10-CM | POA: Diagnosis not present

## 2018-03-11 DIAGNOSIS — C8330 Diffuse large B-cell lymphoma, unspecified site: Secondary | ICD-10-CM | POA: Diagnosis not present

## 2018-03-11 MED ORDER — IOPAMIDOL (ISOVUE-300) INJECTION 61%
100.0000 mL | Freq: Once | INTRAVENOUS | Status: AC | PRN
  Administered 2018-03-11: 100 mL via INTRAVENOUS

## 2018-03-11 MED ORDER — IOPAMIDOL (ISOVUE-300) INJECTION 61%
INTRAVENOUS | Status: AC
  Filled 2018-03-11: qty 100

## 2018-03-12 DIAGNOSIS — E039 Hypothyroidism, unspecified: Secondary | ICD-10-CM | POA: Diagnosis not present

## 2018-03-14 DIAGNOSIS — E039 Hypothyroidism, unspecified: Secondary | ICD-10-CM | POA: Diagnosis not present

## 2018-03-17 ENCOUNTER — Encounter: Payer: Self-pay | Admitting: Internal Medicine

## 2018-03-17 ENCOUNTER — Inpatient Hospital Stay: Payer: Medicare Other | Admitting: Internal Medicine

## 2018-03-17 VITALS — BP 146/74 | HR 91 | Temp 98.0°F | Resp 17 | Ht 68.0 in | Wt 198.3 lb

## 2018-03-17 DIAGNOSIS — N62 Hypertrophy of breast: Secondary | ICD-10-CM

## 2018-03-17 DIAGNOSIS — K219 Gastro-esophageal reflux disease without esophagitis: Secondary | ICD-10-CM

## 2018-03-17 DIAGNOSIS — Z9221 Personal history of antineoplastic chemotherapy: Secondary | ICD-10-CM | POA: Diagnosis not present

## 2018-03-17 DIAGNOSIS — Z87442 Personal history of urinary calculi: Secondary | ICD-10-CM

## 2018-03-17 DIAGNOSIS — D61810 Antineoplastic chemotherapy induced pancytopenia: Secondary | ICD-10-CM

## 2018-03-17 DIAGNOSIS — Z9225 Personal history of immunosupression therapy: Secondary | ICD-10-CM

## 2018-03-17 DIAGNOSIS — Z79899 Other long term (current) drug therapy: Secondary | ICD-10-CM

## 2018-03-17 DIAGNOSIS — C8338 Diffuse large B-cell lymphoma, lymph nodes of multiple sites: Secondary | ICD-10-CM | POA: Diagnosis not present

## 2018-03-17 DIAGNOSIS — R531 Weakness: Secondary | ICD-10-CM

## 2018-03-17 DIAGNOSIS — T451X5S Adverse effect of antineoplastic and immunosuppressive drugs, sequela: Secondary | ICD-10-CM

## 2018-03-17 NOTE — Progress Notes (Signed)
[place] [phone]   [phone]  OFFICE PROGRESS NOTE  Lawerance Cruel, MD [place] [address]  DIAGNOSIS: Stage II large B-cell non-Hodgkin lymphoma diagnosed in August 2017.  PRIOR THERAPY: Systemic chemotherapy with CHOP/Rituxan every 3 weeks with Neulasta support, status post 6 cycles.  Last dose was given on 08/14/2016.  CURRENT THERAPY: Observation..  INTERVAL HISTORY: Seth Gilbert 82 y.o. male returns to the clinic today for follow-up visit.  The patient has no complaints today.  He denied having any chest pain, shortness of breath, cough or hemoptysis.  He denied having any recent weight loss or night sweats.  He has no palpable lymphadenopathy.  He has no nausea, vomiting, diarrhea or constipation.  The patient is here today for evaluation and repeat blood work as well as CT scan for restaging of his disease.  MEDICAL HISTORY: Past Medical History:  Diagnosis Date  . Cancer ([place])    NHL  . Diverticulitis   . GERD (gastroesophageal reflux disease)    Heartburn at times  . Heart murmur    "comes and goes" onset 4th grade - last time anyone heard it was 01/2014  . History of hiatal hernia   . History of kidney stones   . HOH (hard of hearing)   . Non-Hodgkin lymphoma ([place])   . Peripheral neuropathy 09/11/2016  . PONV (postoperative nausea and vomiting)   . Rosacea   . Wears glasses     ALLERGIES:  is allergic to no known allergies.  MEDICATIONS:  Current Outpatient Medications  Medication Sig Dispense Refill  . Glucosamine HCl (GLUCOSAMINE PO) Take 1,800 mg by mouth 3 (three) times daily.    Marland Kitchen levothyroxine (SYNTHROID, LEVOTHROID) 50 MCG tablet Take 1 tablet by mouth daily.  2  . metroNIDAZOLE (METROGEL) 0.75 % gel Apply 1 application topically daily.     . Multiple Vitamins-Minerals (CENTRUM SILVER PO) Take 1 tablet by mouth daily.     Marland Kitchen senna (SENOKOT) 8.6 MG tablet Take 1 tablet by mouth  daily as needed for constipation.    . vitamin E 400 UNIT capsule Take 400 Units by mouth daily.    Marland Kitchen ampicillin (PRINCIPEN) 500 MG capsule Take 500 mg by mouth every other day.     . famotidine (PEPCID) 40 MG tablet Take 40 mg by mouth daily as needed for heartburn.     . lidocaine-prilocaine (EMLA) cream Apply 1 application topically as needed. (Patient not taking: Reported on 03/17/2018) 30 g 2   No current facility-administered medications for this visit.     SURGICAL HISTORY:  Past Surgical History:  Procedure Laterality Date  . APPENDECTOMY     2-3 yrs old  . COLONOSCOPY    . HEMORROIDECTOMY     inflammed anal gland removed  . INGUINAL HERNIA REPAIR Left 02/11/2014   Procedure: LEFT INGUINAL HERNIA REPAIR WITH MESH ;  Surgeon: Joyice Faster. Cornett, MD;  Location: [facility];  Service: General;  Laterality: Left;  . INSERTION OF MESH N/A 02/11/2014   Procedure: INSERTION OF MESH;  Surgeon: Joyice Faster. Cornett, MD;  Location: [facility];  Service: General;  Laterality: N/A;  . PORTACATH PLACEMENT Left 04/10/2016   Procedure: INSERTION PORT-A-CATH into LEFT Subclavian Vein with guidance from ultrasound and flouroscopy;  Surgeon: Grace Isaac, MD;  Location: [facility];  Service: Thoracic;  Laterality: Left;  . TONSILLECTOMY    . VIDEO BRONCHOSCOPY WITH ENDOBRONCHIAL  ULTRASOUND N/A 03/30/2016   Procedure: VIDEO BRONCHOSCOPY WITH ENDOBRONCHIAL Gerrit Heck TRANSBRONCHIAL BIOPSY;  Surgeon: Grace Isaac, MD;  Location: [facility] OR;  Service: Thoracic;  Laterality: N/A;    REVIEW OF SYSTEMS:  A comprehensive review of systems was negative.   PHYSICAL EXAMINATION: General appearance: alert, cooperative and no distress Head: Normocephalic, without obvious abnormality, atraumatic Neck: no adenopathy, no JVD, supple, symmetrical, trachea midline and thyroid not enlarged, symmetric, no tenderness/mass/nodules Lymph nodes: Cervical, supraclavicular, and axillary nodes  normal. Resp: clear to auscultation bilaterally Back: symmetric, no curvature. ROM normal. No CVA tenderness. Cardio: regular rate and rhythm, S1, S2 normal, no murmur, click, rub or gallop GI: soft, non-tender; bowel sounds normal; no masses,  no organomegaly Extremities: extremities normal, atraumatic, no cyanosis or edema  ECOG PERFORMANCE STATUS: 0 - Asymptomatic  Blood pressure (!) 146/74, pulse 91, temperature 98 F (36.7 C), temperature source Oral, resp. rate 17, height 5\' 8"  (1.727 m), weight 198 lb 4.8 oz (89.9 kg), SpO2 100 %.  LABORATORY DATA: Lab Results  Component Value Date   WBC 5.9 03/10/2018   HGB 13.6 03/10/2018   HCT 39.8 03/10/2018   MCV 98.8 (H) 03/10/2018   PLT 160 03/10/2018      Chemistry      Component Value Date/Time   NA 140 03/10/2018 1126   NA 140 02/26/2017 1326   K 4.3 03/10/2018 1126   K 4.3 02/26/2017 1326   CL 107 03/10/2018 1126   CO2 26 03/10/2018 1126   CO2 26 02/26/2017 1326   BUN 16 03/10/2018 1126   BUN 15.7 02/26/2017 1326   CREATININE 1.10 03/10/2018 1126   CREATININE 0.9 02/26/2017 1326      Component Value Date/Time   CALCIUM 9.1 03/10/2018 1126   CALCIUM 9.2 02/26/2017 1326   ALKPHOS 72 03/10/2018 1126   ALKPHOS 74 02/26/2017 1326   AST 17 03/10/2018 1126   AST 20 02/26/2017 1326   ALT 16 03/10/2018 1126   ALT 15 02/26/2017 1326   BILITOT 0.5 03/10/2018 1126   BILITOT 0.47 02/26/2017 1326       RADIOGRAPHIC STUDIES: Ct Chest W Contrast  Result Date: 03/11/2018 CLINICAL DATA:  Large B-cell lymphoma diagnosed in 2017. Restaging assessment. Chemotherapy completed. EXAM: CT CHEST, ABDOMEN, AND PELVIS WITH CONTRAST TECHNIQUE: Multidetector CT imaging of the chest, abdomen and pelvis was performed following the standard protocol during bolus administration of intravenous contrast. CONTRAST:  168mL ISOVUE-300 IOPAMIDOL (ISOVUE-300) INJECTION 61% COMPARISON:  03/06/2017 FINDINGS: CT CHEST FINDINGS Cardiovascular: Left  Port-A-Cath tip: SVC. Coronary, aortic arch, and branch vessel atherosclerotic vascular disease. Small anterior pericardial effusion. Mediastinum/Nodes: Right paratracheal node 0.7 cm in short axis on image 15/2, previously 0.5 cm. Moderate-sized hiatal hernia with small adjacent lymph nodes not appreciably changed from the prior exam. Lungs/Pleura: Chronic lingular scarring. Musculoskeletal: Thoracic spondylosis with mild lower thoracic kyphosis. Bilateral gynecomastia. CT ABDOMEN PELVIS FINDINGS Hepatobiliary: Mildly contracted gallbladder. Otherwise unremarkable. Pancreas: Unremarkable Spleen: Unremarkable.  No splenomegaly. Adrenals/Urinary Tract: 4 mm right kidney upper pole nonobstructive renal calculus. Adrenal glands normal. The kidneys and urinary bladder appear otherwise unremarkable. Stomach/Bowel: Sigmoid colon diverticulosis. Vascular/Lymphatic: Aortoiliac atherosclerotic vascular disease. No pathologic adenopathy identified. Reproductive: Mild prostatomegaly with coarse calcifications centrally in the prostate gland. Other: No supplemental non-categorized findings. Musculoskeletal: Prior left inguinal hernia repair. Spurring of both hips. Mild lumbar spondylosis. Disc bulge at L5-S1. IMPRESSION: 1. No findings of recurrent/active lymphoma. 2. Other imaging findings of potential clinical significance: Aortic Atherosclerosis (ICD10-I70.0). Coronary atherosclerosis. Small anterior pericardial  effusion. Moderate-sized hiatal hernia. Chronic lingular scarring. Nonobstructive right nephrolithiasis. Sigmoid colon diverticulosis. Mild prostatomegaly. Prior left inguinal hernia repair. Degenerative spurring of both hips. Electronically Signed   By: Van Clines M.D.   On: 03/11/2018 21:23   Ct Abdomen Pelvis W Contrast  Result Date: 03/11/2018 CLINICAL DATA:  Large B-cell lymphoma diagnosed in 2017. Restaging assessment. Chemotherapy completed. EXAM: CT CHEST, ABDOMEN, AND PELVIS WITH CONTRAST  TECHNIQUE: Multidetector CT imaging of the chest, abdomen and pelvis was performed following the standard protocol during bolus administration of intravenous contrast. CONTRAST:  162mL ISOVUE-300 IOPAMIDOL (ISOVUE-300) INJECTION 61% COMPARISON:  03/06/2017 FINDINGS: CT CHEST FINDINGS Cardiovascular: Left Port-A-Cath tip: SVC. Coronary, aortic arch, and branch vessel atherosclerotic vascular disease. Small anterior pericardial effusion. Mediastinum/Nodes: Right paratracheal node 0.7 cm in short axis on image 15/2, previously 0.5 cm. Moderate-sized hiatal hernia with small adjacent lymph nodes not appreciably changed from the prior exam. Lungs/Pleura: Chronic lingular scarring. Musculoskeletal: Thoracic spondylosis with mild lower thoracic kyphosis. Bilateral gynecomastia. CT ABDOMEN PELVIS FINDINGS Hepatobiliary: Mildly contracted gallbladder. Otherwise unremarkable. Pancreas: Unremarkable Spleen: Unremarkable.  No splenomegaly. Adrenals/Urinary Tract: 4 mm right kidney upper pole nonobstructive renal calculus. Adrenal glands normal. The kidneys and urinary bladder appear otherwise unremarkable. Stomach/Bowel: Sigmoid colon diverticulosis. Vascular/Lymphatic: Aortoiliac atherosclerotic vascular disease. No pathologic adenopathy identified. Reproductive: Mild prostatomegaly with coarse calcifications centrally in the prostate gland. Other: No supplemental non-categorized findings. Musculoskeletal: Prior left inguinal hernia repair. Spurring of both hips. Mild lumbar spondylosis. Disc bulge at L5-S1. IMPRESSION: 1. No findings of recurrent/active lymphoma. 2. Other imaging findings of potential clinical significance: Aortic Atherosclerosis (ICD10-I70.0). Coronary atherosclerosis. Small anterior pericardial effusion. Moderate-sized hiatal hernia. Chronic lingular scarring. Nonobstructive right nephrolithiasis. Sigmoid colon diverticulosis. Mild prostatomegaly. Prior left inguinal hernia repair. Degenerative spurring of  both hips. Electronically Signed   By: Van Clines M.D.   On: 03/11/2018 21:23    ASSESSMENT AND PLAN: This is a very pleasant 83 years old white male with stage II large B-cell non-Hodgkin lymphoma diagnosed in August 2017. He is status post 6 cycles of systemic chemotherapy with CHOP/Rituxan. He tolerated his treatment fairly well except for chemotherapy-induced pancytopenia and weakness.  He has been on observation since the completion of his treatment.  He is feeling fine with no concerning complaints. The patient had a repeat CT scan of the chest, abdomen and pelvis performed recently.  I personally and independently reviewed the scans and discussed the results with the patient today.  His scan showed no concerning findings for disease recurrence. I recommended for the patient to continue on observation with repeat CBC, comprehensive metabolic panel and LDH in 6 months. The patient was advised to call immediately if he has any concerning symptoms in the interval. The patient voices understanding of current disease status and treatment options and is in agreement with the current care plan. All questions were answered. The patient knows to call the clinic with any problems, questions or concerns. We can certainly see the patient much sooner if necessary. I spent 10 minutes counseling the patient face to face. The total time spent in the appointment was 15 minutes.  Disclaimer: This note was dictated with voice recognition software. Similar sounding words can inadvertently be transcribed and may not be corrected upon review.

## 2018-03-18 ENCOUNTER — Telehealth: Payer: Self-pay | Admitting: Internal Medicine

## 2018-03-18 NOTE — Telephone Encounter (Signed)
Scheduled appt per 7/22 los - pt aware - reminder letter sent in the mail with appt date and time.

## 2018-04-09 ENCOUNTER — Telehealth: Payer: Self-pay | Admitting: Internal Medicine

## 2018-04-09 NOTE — Telephone Encounter (Signed)
Scheduled appt per 8/12 sch message - pt is aware of appt date and time.

## 2018-05-07 ENCOUNTER — Inpatient Hospital Stay: Payer: Medicare Other | Attending: Internal Medicine

## 2018-05-07 DIAGNOSIS — C8338 Diffuse large B-cell lymphoma, lymph nodes of multiple sites: Secondary | ICD-10-CM

## 2018-05-07 DIAGNOSIS — Z9225 Personal history of immunosupression therapy: Secondary | ICD-10-CM | POA: Insufficient documentation

## 2018-05-07 DIAGNOSIS — Z9221 Personal history of antineoplastic chemotherapy: Secondary | ICD-10-CM | POA: Insufficient documentation

## 2018-05-07 DIAGNOSIS — Z452 Encounter for adjustment and management of vascular access device: Secondary | ICD-10-CM | POA: Insufficient documentation

## 2018-05-07 DIAGNOSIS — Z95828 Presence of other vascular implants and grafts: Secondary | ICD-10-CM

## 2018-05-07 MED ORDER — SODIUM CHLORIDE 0.9% FLUSH
10.0000 mL | INTRAVENOUS | Status: DC | PRN
  Administered 2018-05-07: 10 mL via INTRAVENOUS
  Filled 2018-05-07: qty 10

## 2018-05-07 MED ORDER — HEPARIN SOD (PORK) LOCK FLUSH 100 UNIT/ML IV SOLN
500.0000 [IU] | Freq: Once | INTRAVENOUS | Status: AC | PRN
  Administered 2018-05-07: 500 [IU] via INTRAVENOUS
  Filled 2018-05-07: qty 5

## 2018-06-30 DIAGNOSIS — Z23 Encounter for immunization: Secondary | ICD-10-CM | POA: Diagnosis not present

## 2018-07-02 ENCOUNTER — Telehealth: Payer: Self-pay

## 2018-07-02 ENCOUNTER — Inpatient Hospital Stay: Payer: Medicare Other | Attending: Internal Medicine

## 2018-07-02 DIAGNOSIS — C8338 Diffuse large B-cell lymphoma, lymph nodes of multiple sites: Secondary | ICD-10-CM

## 2018-07-02 DIAGNOSIS — Z9221 Personal history of antineoplastic chemotherapy: Secondary | ICD-10-CM | POA: Insufficient documentation

## 2018-07-02 DIAGNOSIS — Z9225 Personal history of immunosupression therapy: Secondary | ICD-10-CM | POA: Insufficient documentation

## 2018-07-02 DIAGNOSIS — Z452 Encounter for adjustment and management of vascular access device: Secondary | ICD-10-CM | POA: Diagnosis not present

## 2018-07-02 DIAGNOSIS — Z95828 Presence of other vascular implants and grafts: Secondary | ICD-10-CM

## 2018-07-02 MED ORDER — SODIUM CHLORIDE 0.9% FLUSH
10.0000 mL | INTRAVENOUS | Status: DC | PRN
  Administered 2018-07-02: 10 mL via INTRAVENOUS
  Filled 2018-07-02: qty 10

## 2018-07-02 MED ORDER — HEPARIN SOD (PORK) LOCK FLUSH 100 UNIT/ML IV SOLN
500.0000 [IU] | Freq: Once | INTRAVENOUS | Status: AC | PRN
  Administered 2018-07-02: 500 [IU] via INTRAVENOUS
  Filled 2018-07-02: qty 5

## 2018-07-02 NOTE — Telephone Encounter (Signed)
Per 11/6 walk in. Patient requested to move his lab due to vacation during the holiday.

## 2018-08-18 ENCOUNTER — Inpatient Hospital Stay: Payer: Medicare Other | Attending: Internal Medicine

## 2018-08-18 VITALS — BP 126/79 | HR 73 | Temp 98.3°F | Resp 16

## 2018-08-18 DIAGNOSIS — Z9225 Personal history of immunosupression therapy: Secondary | ICD-10-CM | POA: Diagnosis not present

## 2018-08-18 DIAGNOSIS — Z9221 Personal history of antineoplastic chemotherapy: Secondary | ICD-10-CM | POA: Insufficient documentation

## 2018-08-18 DIAGNOSIS — Z452 Encounter for adjustment and management of vascular access device: Secondary | ICD-10-CM | POA: Diagnosis not present

## 2018-08-18 DIAGNOSIS — C8338 Diffuse large B-cell lymphoma, lymph nodes of multiple sites: Secondary | ICD-10-CM

## 2018-08-18 DIAGNOSIS — Z95828 Presence of other vascular implants and grafts: Secondary | ICD-10-CM

## 2018-08-18 MED ORDER — SODIUM CHLORIDE 0.9% FLUSH
10.0000 mL | INTRAVENOUS | Status: DC | PRN
  Administered 2018-08-18: 10 mL via INTRAVENOUS
  Filled 2018-08-18: qty 10

## 2018-08-18 MED ORDER — HEPARIN SOD (PORK) LOCK FLUSH 100 UNIT/ML IV SOLN
500.0000 [IU] | Freq: Once | INTRAVENOUS | Status: AC | PRN
  Administered 2018-08-18: 500 [IU] via INTRAVENOUS
  Filled 2018-08-18: qty 5

## 2018-09-16 ENCOUNTER — Encounter: Payer: Self-pay | Admitting: Internal Medicine

## 2018-09-16 ENCOUNTER — Inpatient Hospital Stay: Payer: Medicare Other

## 2018-09-16 ENCOUNTER — Inpatient Hospital Stay: Payer: Medicare Other | Attending: Internal Medicine | Admitting: Internal Medicine

## 2018-09-16 VITALS — BP 128/73 | HR 97 | Temp 98.2°F | Resp 20 | Ht 68.0 in | Wt 198.1 lb

## 2018-09-16 DIAGNOSIS — C8338 Diffuse large B-cell lymphoma, lymph nodes of multiple sites: Secondary | ICD-10-CM

## 2018-09-16 DIAGNOSIS — Z87442 Personal history of urinary calculi: Secondary | ICD-10-CM

## 2018-09-16 DIAGNOSIS — Z95828 Presence of other vascular implants and grafts: Secondary | ICD-10-CM

## 2018-09-16 DIAGNOSIS — Z9225 Personal history of immunosupression therapy: Secondary | ICD-10-CM | POA: Diagnosis not present

## 2018-09-16 DIAGNOSIS — Z79899 Other long term (current) drug therapy: Secondary | ICD-10-CM | POA: Diagnosis not present

## 2018-09-16 DIAGNOSIS — K219 Gastro-esophageal reflux disease without esophagitis: Secondary | ICD-10-CM | POA: Diagnosis not present

## 2018-09-16 DIAGNOSIS — Z9221 Personal history of antineoplastic chemotherapy: Secondary | ICD-10-CM

## 2018-09-16 LAB — CMP (CANCER CENTER ONLY)
ALT: 13 U/L (ref 0–44)
AST: 16 U/L (ref 15–41)
Albumin: 3.9 g/dL (ref 3.5–5.0)
Alkaline Phosphatase: 74 U/L (ref 38–126)
Anion gap: 9 (ref 5–15)
BUN: 20 mg/dL (ref 8–23)
CHLORIDE: 103 mmol/L (ref 98–111)
CO2: 28 mmol/L (ref 22–32)
CREATININE: 1.12 mg/dL (ref 0.61–1.24)
Calcium: 9.1 mg/dL (ref 8.9–10.3)
GFR, Est AFR Am: >60 mL/min (ref >60)
Glucose, Bld: 124 mg/dL — ABNORMAL HIGH (ref 70–99)
POTASSIUM: 4.2 mmol/L (ref 3.5–5.1)
Sodium: 140 mmol/L (ref 135–145)
Total Bilirubin: 0.7 mg/dL (ref 0.3–1.2)
Total Protein: 6.5 g/dL (ref 6.5–8.1)

## 2018-09-16 LAB — CBC WITH DIFFERENTIAL (CANCER CENTER ONLY)
Abs Immature Granulocytes: 0.02 10*3/uL (ref 0.00–0.07)
BASOS ABS: 0 10*3/uL (ref 0.0–0.1)
BASOS PCT: 0 %
EOS PCT: 2 %
Eosinophils Absolute: 0.2 10*3/uL (ref 0.0–0.5)
HCT: 42.6 % (ref 39.0–52.0)
HEMOGLOBIN: 14.1 g/dL (ref 13.0–17.0)
Immature Granulocytes: 0 %
LYMPHS PCT: 23 %
Lymphs Abs: 1.8 10*3/uL (ref 0.7–4.0)
MCH: 32.9 pg (ref 26.0–34.0)
MCHC: 33.1 g/dL (ref 30.0–36.0)
MCV: 99.5 fL (ref 80.0–100.0)
Monocytes Absolute: 0.8 10*3/uL (ref 0.1–1.0)
Monocytes Relative: 10 %
Neutro Abs: 5 10*3/uL (ref 1.7–7.7)
Neutrophils Relative %: 65 %
Platelet Count: 180 10*3/uL (ref 150–400)
RBC: 4.28 MIL/uL (ref 4.22–5.81)
RDW: 13.5 % (ref 11.5–15.5)
WBC: 7.8 10*3/uL (ref 4.0–10.5)
nRBC: 0 % (ref 0.0–0.2)

## 2018-09-16 LAB — LACTATE DEHYDROGENASE: LDH: 167 U/L (ref 98–192)

## 2018-09-16 NOTE — Progress Notes (Signed)
[place] [phone]   [phone]  OFFICE PROGRESS NOTE  Lawerance Cruel, MD [place] [address]  DIAGNOSIS: Stage II large B-cell non-Hodgkin lymphoma diagnosed in August 2017.  PRIOR THERAPY: Systemic chemotherapy with CHOP/Rituxan every 3 weeks with Neulasta support, status post 6 cycles.  Last dose was given on 08/14/2016.  CURRENT THERAPY: Observation..  INTERVAL HISTORY: Seth Gilbert 82 y.o. male returns to the clinic today for follow-up visit.  The patient is feeling fine today with no concerning complaints.  He denied having any chest pain, shortness of breath, cough or hemoptysis.  He denied having any weight loss or night sweats.  He has no nausea, vomiting, diarrhea or constipation.  He has no significant headache or visual changes.  He has no fever or chills.  The patient is here today for evaluation and repeat blood work.  MEDICAL HISTORY: Past Medical History:  Diagnosis Date  . Cancer ([place])    NHL  . Diverticulitis   . GERD (gastroesophageal reflux disease)    Heartburn at times  . Heart murmur    "comes and goes" onset 4th grade - last time anyone heard it was 01/2014  . History of hiatal hernia   . History of kidney stones   . HOH (hard of hearing)   . Non-Hodgkin lymphoma ([place])   . Peripheral neuropathy 09/11/2016  . PONV (postoperative nausea and vomiting)   . Rosacea   . Wears glasses     ALLERGIES:  is allergic to no known allergies.  MEDICATIONS:  Current Outpatient Medications  Medication Sig Dispense Refill  . ampicillin (PRINCIPEN) 500 MG capsule Take 500 mg by mouth every other day.     . famotidine (PEPCID) 40 MG tablet Take 40 mg by mouth daily as needed for heartburn.     . Glucosamine HCl (GLUCOSAMINE PO) Take 1,800 mg by mouth 3 (three) times daily.    Marland Kitchen levothyroxine (SYNTHROID, LEVOTHROID) 50 MCG tablet Take 1 tablet by mouth daily.  2  . metroNIDAZOLE (METROGEL) 0.75 %  gel Apply 1 application topically daily.     . Multiple Vitamins-Minerals (CENTRUM SILVER PO) Take 1 tablet by mouth daily.     Marland Kitchen senna (SENOKOT) 8.6 MG tablet Take 1 tablet by mouth daily as needed for constipation.    . vitamin E 400 UNIT capsule Take 400 Units by mouth daily.    Marland Kitchen lidocaine-prilocaine (EMLA) cream Apply 1 application topically as needed. (Patient not taking: Reported on 03/17/2018) 30 g 2   No current facility-administered medications for this visit.     SURGICAL HISTORY:  Past Surgical History:  Procedure Laterality Date  . APPENDECTOMY     2-3 yrs old  . COLONOSCOPY    . HEMORROIDECTOMY     inflammed anal gland removed  . INGUINAL HERNIA REPAIR Left 02/11/2014   Procedure: LEFT INGUINAL HERNIA REPAIR WITH MESH ;  Surgeon: Joyice Faster. Cornett, MD;  Location: [facility];  Service: General;  Laterality: Left;  . INSERTION OF MESH N/A 02/11/2014   Procedure: INSERTION OF MESH;  Surgeon: Joyice Faster. Cornett, MD;  Location: [facility];  Service: General;  Laterality: N/A;  . PORTACATH PLACEMENT Left 04/10/2016   Procedure: INSERTION PORT-A-CATH into LEFT Subclavian Vein with guidance from ultrasound and flouroscopy;  Surgeon: Grace Isaac, MD;  Location: [facility];  Service: Thoracic;  Laterality: Left;  . TONSILLECTOMY    . VIDEO  BRONCHOSCOPY WITH ENDOBRONCHIAL ULTRASOUND N/A 03/30/2016   Procedure: VIDEO BRONCHOSCOPY WITH ENDOBRONCHIAL Gerrit Heck TRANSBRONCHIAL BIOPSY;  Surgeon: Grace Isaac, MD;  Location: [facility] OR;  Service: Thoracic;  Laterality: N/A;    REVIEW OF SYSTEMS:  A comprehensive review of systems was negative.   PHYSICAL EXAMINATION: General appearance: alert, cooperative and no distress Head: Normocephalic, without obvious abnormality, atraumatic Neck: no adenopathy, no JVD, supple, symmetrical, trachea midline and thyroid not enlarged, symmetric, no tenderness/mass/nodules Lymph nodes: Cervical, supraclavicular, and  axillary nodes normal. Resp: clear to auscultation bilaterally Back: symmetric, no curvature. ROM normal. No CVA tenderness. Cardio: regular rate and rhythm, S1, S2 normal, no murmur, click, rub or gallop GI: soft, non-tender; bowel sounds normal; no masses,  no organomegaly Extremities: extremities normal, atraumatic, no cyanosis or edema  ECOG PERFORMANCE STATUS: 0 - Asymptomatic  Blood pressure 128/73, pulse 97, temperature 98.2 F (36.8 C), temperature source Oral, resp. rate 20, height 5\' 8"  (1.727 m), weight 198 lb 1.6 oz (89.9 kg), SpO2 100 %.  LABORATORY DATA: Lab Results  Component Value Date   WBC 7.8 09/16/2018   HGB 14.1 09/16/2018   HCT 42.6 09/16/2018   MCV 99.5 09/16/2018   PLT 180 09/16/2018      Chemistry      Component Value Date/Time   NA 140 03/10/2018 1126   NA 140 02/26/2017 1326   K 4.3 03/10/2018 1126   K 4.3 02/26/2017 1326   CL 107 03/10/2018 1126   CO2 26 03/10/2018 1126   CO2 26 02/26/2017 1326   BUN 16 03/10/2018 1126   BUN 15.7 02/26/2017 1326   CREATININE 1.10 03/10/2018 1126   CREATININE 0.9 02/26/2017 1326      Component Value Date/Time   CALCIUM 9.1 03/10/2018 1126   CALCIUM 9.2 02/26/2017 1326   ALKPHOS 72 03/10/2018 1126   ALKPHOS 74 02/26/2017 1326   AST 17 03/10/2018 1126   AST 20 02/26/2017 1326   ALT 16 03/10/2018 1126   ALT 15 02/26/2017 1326   BILITOT 0.5 03/10/2018 1126   BILITOT 0.47 02/26/2017 1326       RADIOGRAPHIC STUDIES: No results found.  ASSESSMENT AND PLAN: This is a very pleasant 83 years old white male with stage II large B-cell non-Hodgkin lymphoma diagnosed in August 2017. He is status post 6 cycles of systemic chemotherapy with CHOP/Rituxan. He tolerated his treatment fairly well except for chemotherapy-induced pancytopenia and weakness.  He is currently on observation and feeling fine. Repeat CBC today showed no concerning findings.  Comprehensive metabolic panel and LDH are still pending. I  recommended for the patient to continue on observation with repeat CT scan of the chest, abdomen and pelvis in 6 months. I will arrange for the patient to have Port-A-Cath flush every 2 months. The patient was advised to call immediately if he has any concerning symptoms in the interval. The patient voices understanding of current disease status and treatment options and is in agreement with the current care plan. All questions were answered. The patient knows to call the clinic with any problems, questions or concerns. We can certainly see the patient much sooner if necessary. I spent 10 minutes counseling the patient face to face. The total time spent in the appointment was 15 minutes.  Disclaimer: This note was dictated with voice recognition software. Similar sounding words can inadvertently be transcribed and may not be corrected upon review.

## 2018-09-17 ENCOUNTER — Telehealth: Payer: Self-pay | Admitting: Internal Medicine

## 2018-09-17 NOTE — Telephone Encounter (Signed)
Scheduled appt per 1/21 los - sent reminder letter in the mail with appt date and time

## 2018-11-17 ENCOUNTER — Telehealth: Payer: Self-pay | Admitting: Internal Medicine

## 2018-11-17 ENCOUNTER — Inpatient Hospital Stay: Payer: Medicare Other | Attending: Internal Medicine

## 2018-11-17 ENCOUNTER — Other Ambulatory Visit: Payer: Self-pay

## 2018-11-17 DIAGNOSIS — Z452 Encounter for adjustment and management of vascular access device: Secondary | ICD-10-CM | POA: Insufficient documentation

## 2018-11-17 DIAGNOSIS — Z9221 Personal history of antineoplastic chemotherapy: Secondary | ICD-10-CM | POA: Insufficient documentation

## 2018-11-17 DIAGNOSIS — Z9225 Personal history of immunosupression therapy: Secondary | ICD-10-CM | POA: Diagnosis not present

## 2018-11-17 DIAGNOSIS — Z95828 Presence of other vascular implants and grafts: Secondary | ICD-10-CM

## 2018-11-17 DIAGNOSIS — C8338 Diffuse large B-cell lymphoma, lymph nodes of multiple sites: Secondary | ICD-10-CM

## 2018-11-17 MED ORDER — HEPARIN SOD (PORK) LOCK FLUSH 100 UNIT/ML IV SOLN
500.0000 [IU] | Freq: Once | INTRAVENOUS | Status: AC | PRN
  Administered 2018-11-17: 500 [IU] via INTRAVENOUS
  Filled 2018-11-17: qty 5

## 2018-11-17 MED ORDER — SODIUM CHLORIDE 0.9% FLUSH
10.0000 mL | INTRAVENOUS | Status: DC | PRN
  Administered 2018-11-17: 10 mL via INTRAVENOUS
  Filled 2018-11-17: qty 10

## 2018-11-17 NOTE — Telephone Encounter (Signed)
Patient came in to reschedule his appts.  Patient aware of new appt date and time.

## 2019-01-05 ENCOUNTER — Telehealth: Payer: Self-pay | Admitting: Internal Medicine

## 2019-01-05 NOTE — Telephone Encounter (Signed)
Received call from [place] in reception about patient coming in to reschedule appt. Called and spoke with patient. Reschedule 5/18 appt per his request due to going out of town. Confirmed new date and time

## 2019-01-07 ENCOUNTER — Other Ambulatory Visit: Payer: Self-pay

## 2019-01-07 ENCOUNTER — Inpatient Hospital Stay: Payer: Medicare Other | Attending: Internal Medicine

## 2019-01-07 DIAGNOSIS — Z9221 Personal history of antineoplastic chemotherapy: Secondary | ICD-10-CM | POA: Diagnosis not present

## 2019-01-07 DIAGNOSIS — Z9225 Personal history of immunosupression therapy: Secondary | ICD-10-CM | POA: Diagnosis not present

## 2019-01-07 DIAGNOSIS — Z95828 Presence of other vascular implants and grafts: Secondary | ICD-10-CM

## 2019-01-07 DIAGNOSIS — Z452 Encounter for adjustment and management of vascular access device: Secondary | ICD-10-CM | POA: Diagnosis not present

## 2019-01-07 DIAGNOSIS — C8338 Diffuse large B-cell lymphoma, lymph nodes of multiple sites: Secondary | ICD-10-CM | POA: Diagnosis not present

## 2019-01-07 MED ORDER — SODIUM CHLORIDE 0.9% FLUSH
10.0000 mL | INTRAVENOUS | Status: DC | PRN
  Administered 2019-01-07: 10 mL via INTRAVENOUS
  Filled 2019-01-07: qty 10

## 2019-01-07 MED ORDER — HEPARIN SOD (PORK) LOCK FLUSH 100 UNIT/ML IV SOLN
500.0000 [IU] | Freq: Once | INTRAVENOUS | Status: AC | PRN
  Administered 2019-01-07: 500 [IU] via INTRAVENOUS
  Filled 2019-01-07: qty 5

## 2019-02-25 ENCOUNTER — Telehealth: Payer: Self-pay | Admitting: Internal Medicine

## 2019-02-25 NOTE — Telephone Encounter (Signed)
MM meeting 7/20. Moved f/u from 7/21 and lab/port to 7/14. Confirmed with patient. Date for lab/port per patient. Patient aware ct on 7/15.

## 2019-03-10 ENCOUNTER — Inpatient Hospital Stay: Payer: Medicare Other | Attending: Internal Medicine

## 2019-03-10 ENCOUNTER — Inpatient Hospital Stay: Payer: Medicare Other

## 2019-03-10 ENCOUNTER — Other Ambulatory Visit: Payer: Self-pay

## 2019-03-10 DIAGNOSIS — Z79899 Other long term (current) drug therapy: Secondary | ICD-10-CM | POA: Diagnosis not present

## 2019-03-10 DIAGNOSIS — C8338 Diffuse large B-cell lymphoma, lymph nodes of multiple sites: Secondary | ICD-10-CM | POA: Diagnosis not present

## 2019-03-10 DIAGNOSIS — K219 Gastro-esophageal reflux disease without esophagitis: Secondary | ICD-10-CM | POA: Diagnosis not present

## 2019-03-10 DIAGNOSIS — G629 Polyneuropathy, unspecified: Secondary | ICD-10-CM | POA: Diagnosis not present

## 2019-03-10 DIAGNOSIS — I2510 Atherosclerotic heart disease of native coronary artery without angina pectoris: Secondary | ICD-10-CM | POA: Insufficient documentation

## 2019-03-10 DIAGNOSIS — Z9221 Personal history of antineoplastic chemotherapy: Secondary | ICD-10-CM | POA: Diagnosis not present

## 2019-03-10 DIAGNOSIS — R5383 Other fatigue: Secondary | ICD-10-CM | POA: Diagnosis not present

## 2019-03-10 DIAGNOSIS — Z9225 Personal history of immunosupression therapy: Secondary | ICD-10-CM | POA: Diagnosis not present

## 2019-03-10 DIAGNOSIS — Z95828 Presence of other vascular implants and grafts: Secondary | ICD-10-CM

## 2019-03-10 LAB — CBC WITH DIFFERENTIAL (CANCER CENTER ONLY)
Abs Immature Granulocytes: 0.01 10*3/uL (ref 0.00–0.07)
Basophils Absolute: 0 10*3/uL (ref 0.0–0.1)
Basophils Relative: 1 %
Eosinophils Absolute: 0.1 10*3/uL (ref 0.0–0.5)
Eosinophils Relative: 1 %
HCT: 41.3 % (ref 39.0–52.0)
Hemoglobin: 13.6 g/dL (ref 13.0–17.0)
Immature Granulocytes: 0 %
Lymphocytes Relative: 25 %
Lymphs Abs: 1.7 10*3/uL (ref 0.7–4.0)
MCH: 32.6 pg (ref 26.0–34.0)
MCHC: 32.9 g/dL (ref 30.0–36.0)
MCV: 99 fL (ref 80.0–100.0)
Monocytes Absolute: 0.8 10*3/uL (ref 0.1–1.0)
Monocytes Relative: 12 %
Neutro Abs: 4.1 10*3/uL (ref 1.7–7.7)
Neutrophils Relative %: 61 %
Platelet Count: 170 10*3/uL (ref 150–400)
RBC: 4.17 MIL/uL — ABNORMAL LOW (ref 4.22–5.81)
RDW: 13.7 % (ref 11.5–15.5)
WBC Count: 6.7 10*3/uL (ref 4.0–10.5)
nRBC: 0 % (ref 0.0–0.2)

## 2019-03-10 LAB — CMP (CANCER CENTER ONLY)
ALT: 14 U/L (ref 0–44)
AST: 18 U/L (ref 15–41)
Albumin: 3.7 g/dL (ref 3.5–5.0)
Alkaline Phosphatase: 69 U/L (ref 38–126)
Anion gap: 7 (ref 5–15)
BUN: 17 mg/dL (ref 8–23)
CO2: 25 mmol/L (ref 22–32)
Calcium: 8.6 mg/dL — ABNORMAL LOW (ref 8.9–10.3)
Chloride: 106 mmol/L (ref 98–111)
Creatinine: 1.01 mg/dL (ref 0.61–1.24)
GFR, Est AFR Am: >60 mL/min (ref >60)
GFR, Estimated: >60 mL/min (ref >60)
Glucose, Bld: 133 mg/dL — ABNORMAL HIGH (ref 70–99)
Potassium: 4.3 mmol/L (ref 3.5–5.1)
Sodium: 138 mmol/L (ref 135–145)
Total Bilirubin: 0.5 mg/dL (ref 0.3–1.2)
Total Protein: 6.1 g/dL — ABNORMAL LOW (ref 6.5–8.1)

## 2019-03-10 LAB — LACTATE DEHYDROGENASE: LDH: 182 U/L (ref 98–192)

## 2019-03-10 MED ORDER — SODIUM CHLORIDE 0.9% FLUSH
10.0000 mL | INTRAVENOUS | Status: DC | PRN
  Administered 2019-03-10: 10 mL via INTRAVENOUS
  Filled 2019-03-10: qty 10

## 2019-03-10 MED ORDER — HEPARIN SOD (PORK) LOCK FLUSH 100 UNIT/ML IV SOLN
500.0000 [IU] | Freq: Once | INTRAVENOUS | Status: AC | PRN
  Administered 2019-03-10: 500 [IU] via INTRAVENOUS
  Filled 2019-03-10: qty 5

## 2019-03-11 ENCOUNTER — Ambulatory Visit (HOSPITAL_COMMUNITY)
Admission: RE | Admit: 2019-03-11 | Discharge: 2019-03-11 | Disposition: A | Discharge status: Home / Self Care | Payer: Medicare Other | Source: Ambulatory Visit | Attending: Internal Medicine | Admitting: Internal Medicine

## 2019-03-11 ENCOUNTER — Encounter (HOSPITAL_COMMUNITY): Payer: Self-pay

## 2019-03-11 ENCOUNTER — Other Ambulatory Visit: Payer: Medicare Other

## 2019-03-11 DIAGNOSIS — C8330 Diffuse large B-cell lymphoma, unspecified site: Secondary | ICD-10-CM | POA: Diagnosis not present

## 2019-03-11 DIAGNOSIS — K5730 Diverticulosis of large intestine without perforation or abscess without bleeding: Secondary | ICD-10-CM | POA: Diagnosis not present

## 2019-03-11 DIAGNOSIS — C8338 Diffuse large B-cell lymphoma, lymph nodes of multiple sites: Secondary | ICD-10-CM | POA: Insufficient documentation

## 2019-03-11 MED ORDER — SODIUM CHLORIDE (PF) 0.9 % IJ SOLN
INTRAMUSCULAR | Status: AC
  Filled 2019-03-11: qty 50

## 2019-03-11 MED ORDER — IOHEXOL 300 MG/ML  SOLN
100.0000 mL | Freq: Once | INTRAMUSCULAR | Status: AC | PRN
  Administered 2019-03-11: 14:00:00 100 mL via INTRAVENOUS

## 2019-03-13 ENCOUNTER — Other Ambulatory Visit: Payer: Medicare Other

## 2019-03-16 ENCOUNTER — Other Ambulatory Visit: Payer: Medicare Other

## 2019-03-16 ENCOUNTER — Ambulatory Visit: Payer: Medicare Other | Admitting: Internal Medicine

## 2019-03-17 ENCOUNTER — Encounter: Payer: Self-pay | Admitting: Internal Medicine

## 2019-03-17 ENCOUNTER — Telehealth: Payer: Self-pay | Admitting: Internal Medicine

## 2019-03-17 ENCOUNTER — Other Ambulatory Visit: Payer: Self-pay

## 2019-03-17 ENCOUNTER — Inpatient Hospital Stay: Payer: Medicare Other | Admitting: Internal Medicine

## 2019-03-17 VITALS — BP 140/77 | HR 107 | Temp 99.0°F | Resp 20 | Ht 68.0 in | Wt 194.4 lb

## 2019-03-17 DIAGNOSIS — I2510 Atherosclerotic heart disease of native coronary artery without angina pectoris: Secondary | ICD-10-CM

## 2019-03-17 DIAGNOSIS — Z9221 Personal history of antineoplastic chemotherapy: Secondary | ICD-10-CM | POA: Diagnosis not present

## 2019-03-17 DIAGNOSIS — G629 Polyneuropathy, unspecified: Secondary | ICD-10-CM

## 2019-03-17 DIAGNOSIS — Z79899 Other long term (current) drug therapy: Secondary | ICD-10-CM

## 2019-03-17 DIAGNOSIS — C8338 Diffuse large B-cell lymphoma, lymph nodes of multiple sites: Secondary | ICD-10-CM

## 2019-03-17 DIAGNOSIS — Z9225 Personal history of immunosupression therapy: Secondary | ICD-10-CM | POA: Diagnosis not present

## 2019-03-17 DIAGNOSIS — R5383 Other fatigue: Secondary | ICD-10-CM | POA: Diagnosis not present

## 2019-03-17 DIAGNOSIS — G6289 Other specified polyneuropathies: Secondary | ICD-10-CM

## 2019-03-17 DIAGNOSIS — K219 Gastro-esophageal reflux disease without esophagitis: Secondary | ICD-10-CM

## 2019-03-17 NOTE — Telephone Encounter (Signed)
Scheduled appt per 7/21 los - pt aware of appt date and time

## 2019-03-17 NOTE — Progress Notes (Signed)
[facility] [phone]   [phone]  OFFICE PROGRESS NOTE  Lawerance Cruel, MD [place] [address]  DIAGNOSIS: Stage II large B-cell non-Hodgkin lymphoma diagnosed in August 2017.  PRIOR THERAPY: Systemic chemotherapy with CHOP/Rituxan every 3 weeks with Neulasta support, status post 6 cycles.  Last dose was given on 08/14/2016.  CURRENT THERAPY: Observation..  INTERVAL HISTORY: Seth Gilbert 83 y.o. male returns to the clinic today for follow-up visit.  The patient is feeling fine today with no concerning complaints except for fatigue.  He also has mild neuropathy.  He was unable to exercise regularly since the [place] pandemic.  He denied having any chest pain, shortness of breath, cough or hemoptysis.  He denied having any fever or chills.  He has no nausea, vomiting, diarrhea or constipation.  The patient had repeat CT scan of the chest, abdomen pelvis performed recently and he is here for evaluation and discussion of his scan results.  MEDICAL HISTORY: Past Medical History:  Diagnosis Date  . Cancer ([facility])    NHL  . Diverticulitis   . GERD (gastroesophageal reflux disease)    Heartburn at times  . Heart murmur    "comes and goes" onset 4th grade - last time anyone heard it was 01/2014  . History of hiatal hernia   . History of kidney stones   . HOH (hard of hearing)   . Non-Hodgkin lymphoma ([facility])   . Peripheral neuropathy 09/11/2016  . PONV (postoperative nausea and vomiting)   . Rosacea   . Wears glasses     ALLERGIES:  is allergic to no known allergies.  MEDICATIONS:  Current Outpatient Medications  Medication Sig Dispense Refill  . ampicillin (PRINCIPEN) 500 MG capsule Take 500 mg by mouth every other day.     . famotidine (PEPCID) 40 MG tablet Take 40 mg by mouth daily as needed for heartburn.     . Glucosamine HCl (GLUCOSAMINE PO) Take 1,800 mg by mouth 3 (three) times daily.    Marland Kitchen levothyroxine  (SYNTHROID, LEVOTHROID) 50 MCG tablet Take 1 tablet by mouth daily.  2  . lidocaine-prilocaine (EMLA) cream Apply 1 application topically as needed. (Patient not taking: Reported on 03/17/2018) 30 g 2  . metroNIDAZOLE (METROGEL) 0.75 % gel Apply 1 application topically daily.     . Multiple Vitamins-Minerals (CENTRUM SILVER PO) Take 1 tablet by mouth daily.     Marland Kitchen senna (SENOKOT) 8.6 MG tablet Take 1 tablet by mouth daily as needed for constipation.    . vitamin E 400 UNIT capsule Take 400 Units by mouth daily.     No current facility-administered medications for this visit.     SURGICAL HISTORY:  Past Surgical History:  Procedure Laterality Date  . APPENDECTOMY     2-3 yrs old  . COLONOSCOPY    . HEMORROIDECTOMY     inflammed anal gland removed  . INGUINAL HERNIA REPAIR Left 02/11/2014   Procedure: LEFT INGUINAL HERNIA REPAIR WITH MESH ;  Surgeon: Joyice Faster. Cornett, MD;  Location: [facility];  Service: General;  Laterality: Left;  . INSERTION OF MESH N/A 02/11/2014   Procedure: INSERTION OF MESH;  Surgeon: Joyice Faster. Cornett, MD;  Location: [facility];  Service: General;  Laterality: N/A;  . PORTACATH PLACEMENT Left 04/10/2016   Procedure: INSERTION PORT-A-CATH into LEFT Subclavian Vein with guidance from ultrasound and flouroscopy;  Surgeon: Grace Isaac, MD;  Location:  [facility];  Service: Thoracic;  Laterality: Left;  . TONSILLECTOMY    . VIDEO BRONCHOSCOPY WITH ENDOBRONCHIAL ULTRASOUND N/A 03/30/2016   Procedure: VIDEO BRONCHOSCOPY WITH ENDOBRONCHIAL ULTRASOUND,WITH TRANSBRONCHIAL BIOPSY;  Surgeon: Grace Isaac, MD;  Location: [facility];  Service: Thoracic;  Laterality: N/A;    REVIEW OF SYSTEMS:  A comprehensive review of systems was negative except for: Constitutional: positive for fatigue   PHYSICAL EXAMINATION: General appearance: alert, cooperative and no distress Head: Normocephalic, without obvious abnormality, atraumatic Neck: no adenopathy, no  JVD, supple, symmetrical, trachea midline and thyroid not enlarged, symmetric, no tenderness/mass/nodules Lymph nodes: Cervical, supraclavicular, and axillary nodes normal. Resp: clear to auscultation bilaterally Back: symmetric, no curvature. ROM normal. No CVA tenderness. Cardio: regular rate and rhythm, S1, S2 normal, no murmur, click, rub or gallop GI: soft, non-tender; bowel sounds normal; no masses,  no organomegaly Extremities: extremities normal, atraumatic, no cyanosis or edema  ECOG PERFORMANCE STATUS: 0 - Asymptomatic  Blood pressure 140/77, pulse (!) 107, temperature 99 F (37.2 C), temperature source Oral, resp. rate 20, height 5\' 8"  (1.727 m), weight 194 lb 6.4 oz (88.2 kg), SpO2 100 %.  LABORATORY DATA: Lab Results  Component Value Date   WBC 6.7 03/10/2019   HGB 13.6 03/10/2019   HCT 41.3 03/10/2019   MCV 99.0 03/10/2019   PLT 170 03/10/2019      Chemistry      Component Value Date/Time   NA 138 03/10/2019 1443   NA 140 02/26/2017 1326   K 4.3 03/10/2019 1443   K 4.3 02/26/2017 1326   CL 106 03/10/2019 1443   CO2 25 03/10/2019 1443   CO2 26 02/26/2017 1326   BUN 17 03/10/2019 1443   BUN 15.7 02/26/2017 1326   CREATININE 1.01 03/10/2019 1443   CREATININE 0.9 02/26/2017 1326      Component Value Date/Time   CALCIUM 8.6 (L) 03/10/2019 1443   CALCIUM 9.2 02/26/2017 1326   ALKPHOS 69 03/10/2019 1443   ALKPHOS 74 02/26/2017 1326   AST 18 03/10/2019 1443   AST 20 02/26/2017 1326   ALT 14 03/10/2019 1443   ALT 15 02/26/2017 1326   BILITOT 0.5 03/10/2019 1443   BILITOT 0.47 02/26/2017 1326       RADIOGRAPHIC STUDIES: Ct Chest W Contrast  Result Date: 03/11/2019 CLINICAL DATA:  Restaging of diffuse large B-cell lymphoma EXAM: CT CHEST, ABDOMEN, AND PELVIS WITH CONTRAST TECHNIQUE: Multidetector CT imaging of the chest, abdomen and pelvis was performed following the standard protocol during bolus administration of intravenous contrast. CONTRAST:  176mL  OMNIPAQUE IOHEXOL 300 MG/ML  SOLN COMPARISON:  March 11, 2018 FINDINGS: CT CHEST FINDINGS Cardiovascular: Left Port-A-Cath tip: SVC. Coronary, aortic arch, and branch vessel atherosclerotic vascular disease. Trace pericardial effusion, not appreciably changed. Mediastinum/Nodes: Small type 1 hiatal hernia. No appreciable adenopathy. Lungs/Pleura: Unremarkable Musculoskeletal: Thoracic spondylosis. CT ABDOMEN PELVIS FINDINGS Hepatobiliary: Contracted gallbladder.  Otherwise unremarkable. Pancreas: Unremarkable Spleen: Unremarkable Adrenals/Urinary Tract: Unremarkable Stomach/Bowel: Sigmoid colon diverticulosis. Vascular/Lymphatic: Aortoiliac atherosclerotic vascular disease. No pathologic adenopathy identified. Reproductive: Dense calcification in the prostate gland. Other: No supplemental non-categorized findings. Musculoskeletal: Bridging spurring of the right sacroiliac joint. Prior left inguinal hernia repair. Spurring of the hip joints bilaterally. IMPRESSION: 1. No findings of recurrent malignancy. 2. Other imaging findings of potential clinical significance: Aortic Atherosclerosis (ICD10-I70.0). Coronary atherosclerosis. Small type 1 hiatal hernia. Sigmoid colon diverticulosis. Electronically Signed   By: Van Clines M.D.   On: 03/11/2019 17:27   Ct Abdomen Pelvis W Contrast  Result  Date: 03/11/2019 CLINICAL DATA:  Restaging of diffuse large B-cell lymphoma EXAM: CT CHEST, ABDOMEN, AND PELVIS WITH CONTRAST TECHNIQUE: Multidetector CT imaging of the chest, abdomen and pelvis was performed following the standard protocol during bolus administration of intravenous contrast. CONTRAST:  137mL OMNIPAQUE IOHEXOL 300 MG/ML  SOLN COMPARISON:  March 11, 2018 FINDINGS: CT CHEST FINDINGS Cardiovascular: Left Port-A-Cath tip: SVC. Coronary, aortic arch, and branch vessel atherosclerotic vascular disease. Trace pericardial effusion, not appreciably changed. Mediastinum/Nodes: Small type 1 hiatal hernia. No  appreciable adenopathy. Lungs/Pleura: Unremarkable Musculoskeletal: Thoracic spondylosis. CT ABDOMEN PELVIS FINDINGS Hepatobiliary: Contracted gallbladder.  Otherwise unremarkable. Pancreas: Unremarkable Spleen: Unremarkable Adrenals/Urinary Tract: Unremarkable Stomach/Bowel: Sigmoid colon diverticulosis. Vascular/Lymphatic: Aortoiliac atherosclerotic vascular disease. No pathologic adenopathy identified. Reproductive: Dense calcification in the prostate gland. Other: No supplemental non-categorized findings. Musculoskeletal: Bridging spurring of the right sacroiliac joint. Prior left inguinal hernia repair. Spurring of the hip joints bilaterally. IMPRESSION: 1. No findings of recurrent malignancy. 2. Other imaging findings of potential clinical significance: Aortic Atherosclerosis (ICD10-I70.0). Coronary atherosclerosis. Small type 1 hiatal hernia. Sigmoid colon diverticulosis. Electronically Signed   By: Van Clines M.D.   On: 03/11/2019 17:27    ASSESSMENT AND PLAN: This is a very pleasant 83 years old white male with stage II large B-cell non-Hodgkin lymphoma diagnosed in August 2017. He is status post 6 cycles of systemic chemotherapy with CHOP/Rituxan. He tolerated his treatment fairly well except for chemotherapy-induced pancytopenia and weakness.  He has been on observation since that time. The patient had repeat CT scan of the chest, abdomen pelvis performed recently.  I personally and independently reviewed the scans and discussed the results with the patient today. His a scan showed no concerning findings for disease progression. I recommended for him to continue on observation with repeat blood work in 6 months. He was advised to call immediately if he has any concerning symptoms in the interval. The patient voices understanding of current disease status and treatment options and is in agreement with the current care plan. All questions were answered. The patient knows to call the clinic  with any problems, questions or concerns. We can certainly see the patient much sooner if necessary. I spent 10 minutes counseling the patient face to face. The total time spent in the appointment was 15 minutes.  Disclaimer: This note was dictated with voice recognition software. Similar sounding words can inadvertently be transcribed and may not be corrected upon review.

## 2019-03-18 ENCOUNTER — Ambulatory Visit: Payer: Medicare Other | Admitting: Internal Medicine

## 2019-04-15 DIAGNOSIS — L719 Rosacea, unspecified: Secondary | ICD-10-CM | POA: Diagnosis not present

## 2019-04-15 DIAGNOSIS — L570 Actinic keratosis: Secondary | ICD-10-CM | POA: Diagnosis not present

## 2019-04-21 ENCOUNTER — Inpatient Hospital Stay: Payer: Medicare Other | Attending: Internal Medicine

## 2019-04-21 ENCOUNTER — Other Ambulatory Visit: Payer: Self-pay

## 2019-04-21 DIAGNOSIS — Z452 Encounter for adjustment and management of vascular access device: Secondary | ICD-10-CM | POA: Insufficient documentation

## 2019-04-21 DIAGNOSIS — Z9225 Personal history of immunosupression therapy: Secondary | ICD-10-CM | POA: Insufficient documentation

## 2019-04-21 DIAGNOSIS — C8338 Diffuse large B-cell lymphoma, lymph nodes of multiple sites: Secondary | ICD-10-CM

## 2019-04-21 DIAGNOSIS — Z9221 Personal history of antineoplastic chemotherapy: Secondary | ICD-10-CM | POA: Diagnosis not present

## 2019-04-21 DIAGNOSIS — Z95828 Presence of other vascular implants and grafts: Secondary | ICD-10-CM

## 2019-04-21 MED ORDER — HEPARIN SOD (PORK) LOCK FLUSH 100 UNIT/ML IV SOLN
500.0000 [IU] | Freq: Once | INTRAVENOUS | Status: AC | PRN
  Administered 2019-04-21: 14:00:00 500 [IU] via INTRAVENOUS
  Filled 2019-04-21: qty 5

## 2019-04-21 MED ORDER — SODIUM CHLORIDE 0.9% FLUSH
10.0000 mL | INTRAVENOUS | Status: DC | PRN
  Administered 2019-04-21: 14:00:00 10 mL via INTRAVENOUS
  Filled 2019-04-21: qty 10

## 2019-04-23 DIAGNOSIS — Z23 Encounter for immunization: Secondary | ICD-10-CM | POA: Diagnosis not present

## 2019-04-23 DIAGNOSIS — E039 Hypothyroidism, unspecified: Secondary | ICD-10-CM | POA: Diagnosis not present

## 2019-04-23 DIAGNOSIS — E7800 Pure hypercholesterolemia, unspecified: Secondary | ICD-10-CM | POA: Diagnosis not present

## 2019-04-23 DIAGNOSIS — Z0000 Encounter for general adult medical examination without abnormal findings: Secondary | ICD-10-CM | POA: Diagnosis not present

## 2019-04-29 ENCOUNTER — Emergency Department (HOSPITAL_COMMUNITY): Payer: Medicare Other

## 2019-04-29 ENCOUNTER — Inpatient Hospital Stay (HOSPITAL_COMMUNITY)
Admission: EM | Admit: 2019-04-29 | Discharge: 2019-05-02 | DRG: 871 | Disposition: A | Discharge status: Home / Self Care | Payer: Medicare Other | Attending: Internal Medicine | Admitting: Internal Medicine

## 2019-04-29 ENCOUNTER — Inpatient Hospital Stay (HOSPITAL_COMMUNITY): Payer: Medicare Other

## 2019-04-29 ENCOUNTER — Other Ambulatory Visit: Payer: Self-pay

## 2019-04-29 ENCOUNTER — Encounter (HOSPITAL_COMMUNITY): Payer: Self-pay | Admitting: Emergency Medicine

## 2019-04-29 DIAGNOSIS — H9190 Unspecified hearing loss, unspecified ear: Secondary | ICD-10-CM | POA: Diagnosis not present

## 2019-04-29 DIAGNOSIS — N179 Acute kidney failure, unspecified: Secondary | ICD-10-CM | POA: Diagnosis not present

## 2019-04-29 DIAGNOSIS — N390 Urinary tract infection, site not specified: Secondary | ICD-10-CM | POA: Diagnosis not present

## 2019-04-29 DIAGNOSIS — A4151 Sepsis due to Escherichia coli [E. coli]: Secondary | ICD-10-CM | POA: Diagnosis not present

## 2019-04-29 DIAGNOSIS — A419 Sepsis, unspecified organism: Secondary | ICD-10-CM | POA: Diagnosis not present

## 2019-04-29 DIAGNOSIS — Z20828 Contact with and (suspected) exposure to other viral communicable diseases: Secondary | ICD-10-CM | POA: Diagnosis not present

## 2019-04-29 DIAGNOSIS — E872 Acidosis, unspecified: Secondary | ICD-10-CM | POA: Diagnosis present

## 2019-04-29 DIAGNOSIS — E785 Hyperlipidemia, unspecified: Secondary | ICD-10-CM | POA: Diagnosis present

## 2019-04-29 DIAGNOSIS — Z8572 Personal history of non-Hodgkin lymphomas: Secondary | ICD-10-CM

## 2019-04-29 DIAGNOSIS — Z9221 Personal history of antineoplastic chemotherapy: Secondary | ICD-10-CM

## 2019-04-29 DIAGNOSIS — K219 Gastro-esophageal reflux disease without esophagitis: Secondary | ICD-10-CM | POA: Diagnosis not present

## 2019-04-29 DIAGNOSIS — I499 Cardiac arrhythmia, unspecified: Secondary | ICD-10-CM | POA: Diagnosis not present

## 2019-04-29 DIAGNOSIS — K5732 Diverticulitis of large intestine without perforation or abscess without bleeding: Secondary | ICD-10-CM

## 2019-04-29 DIAGNOSIS — I959 Hypotension, unspecified: Secondary | ICD-10-CM

## 2019-04-29 DIAGNOSIS — G6289 Other specified polyneuropathies: Secondary | ICD-10-CM | POA: Diagnosis not present

## 2019-04-29 DIAGNOSIS — L719 Rosacea, unspecified: Secondary | ICD-10-CM | POA: Diagnosis present

## 2019-04-29 DIAGNOSIS — I9589 Other hypotension: Secondary | ICD-10-CM | POA: Diagnosis not present

## 2019-04-29 DIAGNOSIS — R531 Weakness: Secondary | ICD-10-CM | POA: Diagnosis not present

## 2019-04-29 DIAGNOSIS — I700 Atherosclerosis of aorta: Secondary | ICD-10-CM | POA: Diagnosis not present

## 2019-04-29 DIAGNOSIS — E039 Hypothyroidism, unspecified: Secondary | ICD-10-CM | POA: Diagnosis not present

## 2019-04-29 DIAGNOSIS — I213 ST elevation (STEMI) myocardial infarction of unspecified site: Secondary | ICD-10-CM | POA: Diagnosis not present

## 2019-04-29 DIAGNOSIS — G629 Polyneuropathy, unspecified: Secondary | ICD-10-CM | POA: Diagnosis not present

## 2019-04-29 DIAGNOSIS — Z79890 Hormone replacement therapy (postmenopausal): Secondary | ICD-10-CM

## 2019-04-29 DIAGNOSIS — C8338 Diffuse large B-cell lymphoma, lymph nodes of multiple sites: Secondary | ICD-10-CM | POA: Diagnosis present

## 2019-04-29 DIAGNOSIS — R6521 Severe sepsis with septic shock: Secondary | ICD-10-CM | POA: Diagnosis not present

## 2019-04-29 DIAGNOSIS — I313 Pericardial effusion (noninflammatory): Secondary | ICD-10-CM | POA: Diagnosis not present

## 2019-04-29 DIAGNOSIS — Z79899 Other long term (current) drug therapy: Secondary | ICD-10-CM

## 2019-04-29 DIAGNOSIS — J984 Other disorders of lung: Secondary | ICD-10-CM | POA: Diagnosis not present

## 2019-04-29 DIAGNOSIS — I4510 Unspecified right bundle-branch block: Secondary | ICD-10-CM | POA: Diagnosis not present

## 2019-04-29 LAB — COMPREHENSIVE METABOLIC PANEL
ALT: 17 U/L (ref 0–44)
AST: 28 U/L (ref 15–41)
Albumin: 3.4 g/dL — ABNORMAL LOW (ref 3.5–5.0)
Alkaline Phosphatase: 62 U/L (ref 38–126)
Anion gap: 13 (ref 5–15)
BUN: 21 mg/dL (ref 8–23)
CO2: 19 mmol/L — ABNORMAL LOW (ref 22–32)
Calcium: 8.5 mg/dL — ABNORMAL LOW (ref 8.9–10.3)
Chloride: 108 mmol/L (ref 98–111)
Creatinine, Ser: 1.43 mg/dL — ABNORMAL HIGH (ref 0.61–1.24)
GFR calc Af Amer: 53 mL/min — ABNORMAL LOW (ref >60)
GFR calc non Af Amer: 46 mL/min — ABNORMAL LOW (ref >60)
Glucose, Bld: 120 mg/dL — ABNORMAL HIGH (ref 70–99)
Potassium: 3.6 mmol/L (ref 3.5–5.1)
Sodium: 140 mmol/L (ref 135–145)
Total Bilirubin: 1.3 mg/dL — ABNORMAL HIGH (ref 0.3–1.2)
Total Protein: 5.5 g/dL — ABNORMAL LOW (ref 6.5–8.1)

## 2019-04-29 LAB — CBC WITH DIFFERENTIAL/PLATELET
Abs Immature Granulocytes: 0.08 10*3/uL — ABNORMAL HIGH (ref 0.00–0.07)
Basophils Absolute: 0 10*3/uL (ref 0.0–0.1)
Basophils Relative: 0 %
Eosinophils Absolute: 0 10*3/uL (ref 0.0–0.5)
Eosinophils Relative: 0 %
HCT: 42.3 % (ref 39.0–52.0)
Hemoglobin: 13.7 g/dL (ref 13.0–17.0)
Immature Granulocytes: 1 %
Lymphocytes Relative: 4 %
Lymphs Abs: 0.4 10*3/uL — ABNORMAL LOW (ref 0.7–4.0)
MCH: 33.2 pg (ref 26.0–34.0)
MCHC: 32.4 g/dL (ref 30.0–36.0)
MCV: 102.4 fL — ABNORMAL HIGH (ref 80.0–100.0)
Monocytes Absolute: 0.1 10*3/uL (ref 0.1–1.0)
Monocytes Relative: 1 %
Neutro Abs: 8.7 10*3/uL — ABNORMAL HIGH (ref 1.7–7.7)
Neutrophils Relative %: 94 %
Platelets: 140 10*3/uL — ABNORMAL LOW (ref 150–400)
RBC: 4.13 MIL/uL — ABNORMAL LOW (ref 4.22–5.81)
RDW: 13.5 % (ref 11.5–15.5)
WBC: 9.3 10*3/uL (ref 4.0–10.5)
nRBC: 0 % (ref 0.0–0.2)

## 2019-04-29 LAB — URINALYSIS, ROUTINE W REFLEX MICROSCOPIC
Bilirubin Urine: NEGATIVE
Glucose, UA: NEGATIVE mg/dL
Ketones, ur: 5 mg/dL — AB
Nitrite: NEGATIVE
Protein, ur: NEGATIVE mg/dL
Specific Gravity, Urine: 1.01 (ref 1.005–1.030)
pH: 5 (ref 5.0–8.0)

## 2019-04-29 LAB — LACTIC ACID, PLASMA
Lactic Acid, Venous: 4.7 mmol/L (ref 0.5–1.9)
Lactic Acid, Venous: 3.4 mmol/L (ref 0.5–1.9)
Lactic Acid, Venous: 1.4 mmol/L (ref 0.5–1.9)

## 2019-04-29 LAB — MRSA PCR SCREENING: MRSA by PCR: NEGATIVE

## 2019-04-29 LAB — PROCALCITONIN: Procalcitonin: 32.27 ng/mL

## 2019-04-29 LAB — PROTIME-INR
INR: 1.5 — ABNORMAL HIGH (ref 0.8–1.2)
Prothrombin Time: 18.3 seconds — ABNORMAL HIGH (ref 11.4–15.2)

## 2019-04-29 LAB — SARS CORONAVIRUS 2 BY RT PCR (HOSPITAL ORDER, PERFORMED IN ~~LOC~~ HOSPITAL LAB): SARS Coronavirus 2: NEGATIVE

## 2019-04-29 LAB — APTT: aPTT: 27 seconds (ref 24–36)

## 2019-04-29 MED ORDER — POLYETHYLENE GLYCOL 3350 17 G PO PACK: 17.0000 g | PACK | Freq: Every day | ORAL | Status: DC | PRN

## 2019-04-29 MED ORDER — ONDANSETRON HCL 4 MG PO TABS
4.0000 mg | ORAL_TABLET | Freq: Four times a day (QID) | ORAL | Status: DC | PRN
  Administered 2019-04-30 – 2019-05-01 (×2): 4 mg via ORAL
  Filled 2019-04-29 (×2): qty 1

## 2019-04-29 MED ORDER — SODIUM CHLORIDE 0.9 % IV BOLUS (SEPSIS)
1000.0000 mL | Freq: Once | INTRAVENOUS | Status: AC
  Administered 2019-04-29: 1000 mL via INTRAVENOUS

## 2019-04-29 MED ORDER — VANCOMYCIN HCL IN DEXTROSE 1-5 GM/200ML-% IV SOLN: 1000.0000 mg | Freq: Once | INTRAVENOUS | Status: DC

## 2019-04-29 MED ORDER — LEVOTHYROXINE SODIUM 50 MCG PO TABS
50.0000 ug | ORAL_TABLET | Freq: Every day | ORAL | Status: DC
  Administered 2019-04-30 – 2019-05-02 (×3): 50 ug via ORAL
  Filled 2019-04-29 (×4): qty 1

## 2019-04-29 MED ORDER — SODIUM CHLORIDE 0.9 % IV SOLN
2.0000 g | Freq: Once | INTRAVENOUS | Status: AC
  Administered 2019-04-29: 2 g via INTRAVENOUS
  Filled 2019-04-29: qty 2

## 2019-04-29 MED ORDER — METRONIDAZOLE 0.75 % EX GEL
1.0000 "application " | Freq: Every day | CUTANEOUS | Status: DC
  Administered 2019-04-30 – 2019-05-02 (×3): 1 via TOPICAL
  Filled 2019-04-29: qty 45

## 2019-04-29 MED ORDER — ACETAMINOPHEN 650 MG RE SUPP: 650.0000 mg | Freq: Four times a day (QID) | RECTAL | Status: DC | PRN

## 2019-04-29 MED ORDER — ROSUVASTATIN CALCIUM 20 MG PO TABS
20.0000 mg | ORAL_TABLET | Freq: Every day | ORAL | Status: DC
  Administered 2019-04-30 – 2019-05-02 (×3): 20 mg via ORAL
  Filled 2019-04-29 (×3): qty 1

## 2019-04-29 MED ORDER — ENOXAPARIN SODIUM 40 MG/0.4ML ~~LOC~~ SOLN
40.0000 mg | SUBCUTANEOUS | Status: DC
  Administered 2019-05-01: 40 mg via SUBCUTANEOUS
  Filled 2019-04-29: qty 0.4

## 2019-04-29 MED ORDER — SODIUM CHLORIDE 0.9 % IV SOLN
INTRAVENOUS | Status: DC
  Administered 2019-04-29 – 2019-05-01 (×4): via INTRAVENOUS

## 2019-04-29 MED ORDER — ACETAMINOPHEN 325 MG PO TABS
650.0000 mg | ORAL_TABLET | Freq: Four times a day (QID) | ORAL | Status: DC | PRN
  Administered 2019-04-30 – 2019-05-01 (×3): 650 mg via ORAL
  Filled 2019-04-29 (×4): qty 2

## 2019-04-29 MED ORDER — SODIUM CHLORIDE 0.9% FLUSH
3.0000 mL | Freq: Two times a day (BID) | INTRAVENOUS | Status: DC
  Administered 2019-04-29 – 2019-05-02 (×5): 3 mL via INTRAVENOUS

## 2019-04-29 MED ORDER — ONDANSETRON HCL 4 MG/2ML IJ SOLN: 4.0000 mg | Freq: Four times a day (QID) | INTRAMUSCULAR | Status: DC | PRN

## 2019-04-29 MED ORDER — VANCOMYCIN HCL 10 G IV SOLR
1250.0000 mg | INTRAVENOUS | Status: DC
  Filled 2019-04-29: qty 1250

## 2019-04-29 MED ORDER — DEXTROSE 5 % IV SOLN: 500.0000 mg | Freq: Once | INTRAVENOUS | Status: DC

## 2019-04-29 MED ORDER — SODIUM CHLORIDE 0.9 % IV BOLUS
1000.0000 mL | Freq: Once | INTRAVENOUS | Status: AC
  Administered 2019-04-29: 1000 mL via INTRAVENOUS

## 2019-04-29 MED ORDER — SODIUM CHLORIDE 0.9 % IV SOLN
2.0000 g | Freq: Two times a day (BID) | INTRAVENOUS | Status: DC
  Administered 2019-04-30 (×2): 2 g via INTRAVENOUS
  Filled 2019-04-29 (×3): qty 2

## 2019-04-29 MED ORDER — IOHEXOL 300 MG/ML  SOLN
100.0000 mL | Freq: Once | INTRAMUSCULAR | Status: AC | PRN
  Administered 2019-04-29: 100 mL via INTRAVENOUS

## 2019-04-29 MED ORDER — VANCOMYCIN HCL 10 G IV SOLR
1750.0000 mg | Freq: Once | INTRAVENOUS | Status: AC
  Administered 2019-04-29: 1750 mg via INTRAVENOUS
  Filled 2019-04-29: qty 1750

## 2019-04-29 MED ORDER — DOCUSATE SODIUM 100 MG PO CAPS
100.0000 mg | ORAL_CAPSULE | Freq: Two times a day (BID) | ORAL | Status: DC
  Administered 2019-05-01 – 2019-05-02 (×3): 100 mg via ORAL
  Filled 2019-04-29 (×5): qty 1

## 2019-04-29 NOTE — Sepsis Progress Note (Signed)
Notified bedside nurse of need to draw repeat lactic acid after fluid resusitation order is completed. Marland Kitchen

## 2019-04-29 NOTE — Progress Notes (Addendum)
Pharmacy Antibiotic Note  Seth Gilbert is a 83 y.o. male admitted on 04/29/2019 with sepsis. Afebrile, WBC 9.3, Scr 1.43 (BL ~1). Pharmacy has been consulted for Cefepime and Vancomycin dosing.  Plan: Cefepime 2 g IV once Vancomycin 1750 mg IV once Cefepime 2 g IV q12h (maintenance) Vancomycin 1250 mg IV q24h (estAUC 525, goal AUC 400-550, Scr used: 1.43) Monitor renal function Monitor C/s   Temp (24hrs), Avg:98.3 F (36.8 C), Min:98.3 F (36.8 C), Max:98.3 F (36.8 C)  Recent Labs  Lab 04/29/19 1325 04/29/19 1341  WBC  --  9.3  CREATININE  --  1.43*  LATICACIDVEN 4.7*  --     CrCl cannot be calculated (Unknown ideal weight.).    Allergies  Allergen Reactions  . No Known Allergies     Antimicrobials this admission: Cefepime 9/2 >> Vancomycin 9/2>>  Microbiology results: 9/2 Bcx: pending    Thank you for allowing pharmacy to be a part of this patient's care.   Lorel Monaco, PharmD PGY1 Ambulatory Care Resident [facility] # [phone]

## 2019-04-29 NOTE — ED Provider Notes (Signed)
[facility] EMERGENCY DEPARTMENT Provider Note   CSN: [id number] Arrival date & time: 04/29/19  1307     History   Chief Complaint Chief Complaint  Patient presents with   Hypotension    HPI Seth Gilbert is a 83 y.o. male past medical history of diverticulitis, non-Hodgkin's lymphoma, heart murmur brought in by EMS who presents for evaluation of hypotension.  Patient reports that about 3 AM this morning, he woke up and had chills.  He states he has not noted any fevers.  He states that he felt nauseous and somewhat generalized weak but did not have any other symptoms.  Today, just prior to ED arrival, he went to have a bowel movement and had what sounds like a vagal reaction and was having difficulty getting up secondary to weakness.  He eased himself onto the ground but was unable to push himself off the ground, prompting EMS call.  On EMS arrival, he was diaphoretic and hypotensive.  Blood pressure was noted to be in the 80s over 50s.  They gave him 1300 cc of normal saline but was still hypotensive so they started an epi drip.  Patient states he did not have any chest pain, difficulty breathing.  He did not lose consciousness.  On ED arrival, he does not have any complaints of any pain.  He states he still feels tired and weak but does not have any focal weakness.  He states he is not any travel or known COVID-19 exposure.  He denies any numbness/weakness of his arms or legs, abdominal pain, blood in stools.     The history is provided by the patient.    Past Medical History:  Diagnosis Date   Diverticulitis    GERD (gastroesophageal reflux disease)    Heartburn at times   Heart murmur    "comes and goes" onset 4th grade - last time anyone heard it was 01/2014   History of hiatal hernia    History of kidney stones    HOH (hard of hearing)    Non-Hodgkin lymphoma ([place])    Peripheral neuropathy 09/11/2016   PONV (postoperative nausea and vomiting)     Rosacea    Wears glasses     Patient Active Problem List   Diagnosis Date Noted   Neuropathy due to chemotherapeutic drug ([place]) 02/12/2017   Port catheter in place 11/06/2016   Peripheral neuropathy 09/11/2016   Atypical pneumonia 06/18/2016   Acute respiratory failure with hypoxia ([place]) 06/18/2016   Fever 06/16/2016   Lactic acidosis 06/16/2016   Dehydration 06/16/2016   Encounter for antineoplastic chemotherapy 04/17/2016   NHL (non-Hodgkin's lymphoma) ([place]) 04/05/2016   Diffuse large B-cell lymphoma of lymph nodes of multiple regions ([place]) 04/05/2016   Lung mass 03/13/2016   Post-operative state 03/08/2014   Left inguinal hernia 05/08/2013    Past Surgical History:  Procedure Laterality Date   APPENDECTOMY     2-3 yrs old   COLONOSCOPY     HEMORROIDECTOMY     inflammed anal gland removed   INGUINAL HERNIA REPAIR Left 02/11/2014   Procedure: LEFT INGUINAL HERNIA REPAIR WITH MESH ;  Surgeon: Joyice Faster. Cornett, MD;  Location: [facility];  Service: General;  Laterality: Left;   INSERTION OF MESH N/A 02/11/2014   Procedure: INSERTION OF MESH;  Surgeon: Joyice Faster. Cornett, MD;  Location: [facility];  Service: General;  Laterality: N/A;   PORTACATH PLACEMENT Left 04/10/2016   Procedure: INSERTION PORT-A-CATH into LEFT  Subclavian Vein with guidance from ultrasound and flouroscopy;  Surgeon: Grace Isaac, MD;  Location[facility];  Service: Thoracic;  Laterality: Left;   TONSILLECTOMY     VIDEO BRONCHOSCOPY WITH ENDOBRONCHIAL ULTRASOUND N/A 03/30/2016   Procedure: VIDEO BRONCHOSCOPY WITH ENDOBRONCHIAL Gerrit Heck TRANSBRONCHIAL BIOPSY;  Surgeon: Grace Isaac, MD;  Location: [facility];  Service: Thoracic;  Laterality: N/A;        Home Medications    Prior to Admission medications   Medication Sig Start Date End Date Taking? Authorizing Provider  ampicillin (PRINCIPEN) 500 MG capsule Take 500 mg by mouth every other day.      [provider]  famotidine (PEPCID) 40 MG tablet Take 40 mg by mouth daily as needed for heartburn.     [provider]  Glucosamine HCl (GLUCOSAMINE PO) Take 1,800 mg by mouth 3 (three) times daily.    [provider]  levothyroxine (SYNTHROID, LEVOTHROID) 50 MCG tablet Take 1 tablet by mouth daily. 03/14/18   [provider]  lidocaine-prilocaine (EMLA) cream Apply 1 application topically as needed. 09/11/17   Maryanna Shape, NP  metroNIDAZOLE (METROGEL) 0.75 % gel Apply 1 application topically daily.  12/04/13   [provider]  Multiple Vitamins-Minerals (CENTRUM SILVER PO) Take 1 tablet by mouth daily.     [provider]  senna (SENOKOT) 8.6 MG tablet Take 1 tablet by mouth daily as needed for constipation.    [provider]  vitamin E 400 UNIT capsule Take 400 Units by mouth daily.    [provider]    Family History Family History  Problem Relation Age of Onset   COPD Mother    Heart disease Father    Neuropathy Neg Hx     Social History Social History   Tobacco Use   Smoking status: Never Smoker   Smokeless tobacco: Never Used  Substance Use Topics   Alcohol use: No    Comment: occasional- a couple times a month   Drug use: No     Allergies   No known allergies   Review of Systems Review of Systems  Constitutional: Positive for diaphoresis. Negative for fever.  Eyes: Negative for visual disturbance.  Respiratory: Negative for cough and shortness of breath.   Cardiovascular: Negative for chest pain.  Gastrointestinal: Negative for abdominal pain, blood in stool, nausea and vomiting.  Genitourinary: Negative for dysuria and hematuria.  Neurological: Positive for weakness. Negative for numbness and headaches.  All other systems reviewed and are negative.    Physical Exam Updated Vital Signs BP 99/66    Pulse (!) 103    Temp 98.3 F (36.8 C) (Oral)    Resp (!) 23    SpO2 99%    Physical Exam Vitals signs and nursing note reviewed.  Constitutional:      Appearance: Normal appearance. He is well-developed.  HENT:     Head: Normocephalic and atraumatic.  Eyes:     General: Lids are normal.     Conjunctiva/sclera: Conjunctivae normal.     Pupils: Pupils are equal, round, and reactive to light.     Comments: PERRL. EOMs intact. No nystagmus. No neglect.   Neck:     Musculoskeletal: Full passive range of motion without pain.  Cardiovascular:     Rate and Rhythm: Normal rate and regular rhythm.     Pulses: Normal pulses.          Radial pulses are 2+ on the right side and 2+ on the  left side.       Dorsalis pedis pulses are 2+ on the right side and 2+ on the left side.     Heart sounds: Normal heart sounds. No murmur. No friction rub. No gallop.   Pulmonary:     Effort: Pulmonary effort is normal.     Breath sounds: Normal breath sounds.     Comments: Lungs clear to auscultation bilaterally.  Symmetric chest rise.  No wheezing, rales, rhonchi. Abdominal:     Palpations: Abdomen is soft. Abdomen is not rigid.     Tenderness: There is no abdominal tenderness. There is no guarding.     Comments: Abdomen is soft, non-distended, non-tender. No rigidity, No guarding. No peritoneal signs.  Musculoskeletal: Normal range of motion.  Skin:    General: Skin is warm and dry.     Capillary Refill: Capillary refill takes less than 2 seconds.  Neurological:     Mental Status: He is alert and oriented to person, place, and time.     Comments: Cranial nerves III-XII intact Follows commands, Moves all extremities  5/5 strength to BUE and BLE  Sensation intact throughout all major nerve distributions No slurred speech. No facial droop.   Psychiatric:        Speech: Speech normal.      ED Treatments / Results  Labs (all labs ordered are listed, but only abnormal results are displayed) Labs Reviewed  COMPREHENSIVE METABOLIC PANEL - Abnormal; Notable for the  following components:      Result Value   CO2 19 (*)    Glucose, Bld 120 (*)    Creatinine, Ser 1.43 (*)    Calcium 8.5 (*)    Total Protein 5.5 (*)    Albumin 3.4 (*)    Total Bilirubin 1.3 (*)    GFR calc non Af Amer 46 (*)    GFR calc Af Amer 53 (*)    All other components within normal limits  CBC WITH DIFFERENTIAL/PLATELET - Abnormal; Notable for the following components:   RBC 4.13 (*)    MCV 102.4 (*)    Platelets 140 (*)    Neutro Abs 8.7 (*)    Lymphs Abs 0.4 (*)    Abs Immature Granulocytes 0.08 (*)    All other components within normal limits  LACTIC ACID, PLASMA - Abnormal; Notable for the following components:   Lactic Acid, Venous 4.7 (*)    All other components within normal limits  PROTIME-INR - Abnormal; Notable for the following components:   Prothrombin Time 18.3 (*)    INR 1.5 (*)    All other components within normal limits  CULTURE, BLOOD (ROUTINE X 2)  CULTURE, BLOOD (ROUTINE X 2)  URINE CULTURE  SARS CORONAVIRUS 2 (TAT 6-24 HRS)  SARS CORONAVIRUS 2 (HOSPITAL ORDER, [facility])  APTT  LACTIC ACID, PLASMA  URINALYSIS, ROUTINE W REFLEX MICROSCOPIC    EKG EKG Interpretation  Date/Time:  Wednesday April 29 2019 13:43:26 EDT Ventricular Rate:  98 PR Interval:    QRS Duration: 132 QT Interval:  369 QTC Calculation: 472 R Axis:   50 Text Interpretation:  Sinus rhythm Right bundle branch block since previous tracing in 2017, new RBBB Confirmed by Theotis Burrow [phone]) on 04/29/2019 2:14:56 PM   Radiology Dg Chest 2 View  Result Date: 04/29/2019 CLINICAL DATA:  58-year-old male with hypotension EXAM: CHEST - 2 VIEW COMPARISON:  Chest radiograph dated 06/16/2016 FINDINGS: Shallow inspiration with bibasilar atelectasis. No focal consolidation, pleural  effusion, or pneumothorax. Stable cardiac silhouette. Left pectoral Port-A-Cath in similar position. No acute osseous pathology. IMPRESSION: No active cardiopulmonary disease.  Electronically Signed   By: Anner Crete M.D.   On: 04/29/2019 14:33    Procedures .Critical Care Performed by: Volanda Napoleon, PA-C Authorized by: Volanda Napoleon, PA-C   Critical care provider statement:    Critical care time (minutes):  35   Critical care was necessary to treat or prevent imminent or life-threatening deterioration of the following conditions:  Sepsis   Critical care was time spent personally by me on the following activities:  Discussions with consultants, evaluation of patient's response to treatment, examination of patient, ordering and performing treatments and interventions, ordering and review of laboratory studies, ordering and review of radiographic studies, pulse oximetry, re-evaluation of patient's condition, obtaining history from patient or surrogate and review of old charts   I assumed direction of critical care for this patient from another provider in my specialty: yes     (including critical care time)  Medications Ordered in ED Medications  vancomycin (VANCOCIN) 1,750 mg in sodium chloride 0.9 % 500 mL IVPB (1,750 mg Intravenous New Bag/Given 04/29/19 1457)  ceFEPIme (MAXIPIME) 2 g in sodium chloride 0.9 % 100 mL IVPB (has no administration in time range)  vancomycin (VANCOCIN) 1,250 mg in sodium chloride 0.9 % 250 mL IVPB (has no administration in time range)  sodium chloride 0.9 % bolus 1,000 mL (1,000 mLs Intravenous New Bag/Given 04/29/19 1351)  sodium chloride 0.9 % bolus 1,000 mL (1,000 mLs Intravenous New Bag/Given 04/29/19 1458)  ceFEPIme (MAXIPIME) 2 g in sodium chloride 0.9 % 100 mL IVPB (2 g Intravenous New Bag/Given 04/29/19 1458)     Initial Impression / Assessment and Plan / ED Course  I have reviewed the triage vital signs and the nursing notes.  Pertinent labs & imaging results that were available during my care of the patient were reviewed by me and considered in my medical decision making (see chart for details).         83 year old male who presents for evaluation of hypertension.  Reports he had had some chills this morning since about 3 AM.  No fevers.  Denies any chest pain, difficulty breathing.  Sounds like he had a vagal episode while having a bowel movement.  On EMS arrival, he was hypotensive and diaphoretic.  On ED arrival, he is still hypotensive at 91/52.  Patient with no history of heart failure.  Give additional liter fluids, check lab work, EKG.  Lactic elevated at 4.7.  CBC shows no leukocytosis.  Hemoglobin is stable.  CMP shows slight bump in creatinine at 1.43.  Chest x-ray negative for any acute infectious etiology.  Given elevated lactic as well as hypertension, patient given third liter of fluid.  Code sepsis initiated with broad-spectrum antibiotics started.  Unsure of what sources at this time.  Given hypotension as well as sepsis, plan for admission.  Discussed with hospitalist.  Would like me to consult critical care given lactic and hypotension.  Discussed with Dr. Halford Chessman [facility]).  He feels that patient is appropriate for stepdown management.  Updated Dr. Lorin Mercy (hospitalist) who agreeable for admission.   Portions of this note were generated with [employer]. Dictation errors may occur despite best attempts at proofreading.   Final Clinical Impressions(s) / ED Diagnoses   Final diagnoses:  Sepsis, due to unspecified organism, unspecified whether acute organ dysfunction present ([place])  Hypotension, unspecified hypotension type  ED Discharge Orders    None       Desma Mcgregor 05/01/19 1159    Little, Wenda Overland, MD 05/03/19 [phone]

## 2019-04-29 NOTE — ED Notes (Signed)
ED TO INPATIENT HANDOFF REPORT  ED Nurse Name and Phone #:  [phone]  S Name/Age/Gender Seth Gilbert 83 y.o. male Room/Bed: 030C/030C  Code Status   Code Status: Full Code  Home/SNF/Other Home Patient oriented to: self, place, time and situation Is this baseline? Yes   Triage Complete: Triage complete  Chief Complaint 83 yo Male, Hypotensive  Triage Note Pt BIB [facility] from home. Pt states he had a bowel movement and got up and eased to the ground from weakness. Per EMS pt received 1300 NS and Epi drip. Unable to get BP above 80/40.   Allergies Allergies  Allergen Reactions  . No Known Allergies     Level of Care/Admitting Diagnosis ED Disposition    ED Disposition Condition [facility] Area: [place] [[id number]]  Level of Care: Progressive [102]  Covid Evaluation: Confirmed COVID Negative  Diagnosis: Septic shock due to undetermined organism [facility]) [id number]  Admitting Physician: Karmen Bongo [[id number]]  Attending Physician: Karmen Bongo [[id number]]  Estimated length of stay: 3 - 4 days  Certification:: I certify this patient will need inpatient services for at least 2 midnights  PT Class (Do Not Modify): Inpatient [101]  PT Acc Code (Do Not Modify): Private [1]       B Medical/Surgery History Past Medical History:  Diagnosis Date  . Diverticulitis   . GERD (gastroesophageal reflux disease)    Heartburn at times  . Heart murmur    "comes and goes" onset 4th grade - last time anyone heard it was 01/2014  . History of hiatal hernia   . History of kidney stones   . HOH (hard of hearing)   . Non-Hodgkin lymphoma ([place])   . Peripheral neuropathy 09/11/2016  . PONV (postoperative nausea and vomiting)   . Rosacea   . Wears glasses    Past Surgical History:  Procedure Laterality Date  . APPENDECTOMY     2-3 yrs old  . COLONOSCOPY    . HEMORROIDECTOMY     inflammed anal gland removed  . INGUINAL HERNIA REPAIR Left 02/11/2014   Procedure: LEFT INGUINAL HERNIA REPAIR WITH MESH ;  Surgeon: Joyice Faster. Cornett, MD;  Location: [place];  Service: General;  Laterality: Left;  . INSERTION OF MESH N/A 02/11/2014   Procedure: INSERTION OF MESH;  Surgeon: Joyice Faster. Cornett, MD;  Location: [place];  Service: General;  Laterality: N/A;  . PORTACATH PLACEMENT Left 04/10/2016   Procedure: INSERTION PORT-A-CATH into LEFT Subclavian Vein with guidance from ultrasound and flouroscopy;  Surgeon: Grace Isaac, MD;  Location: [place];  Service: Thoracic;  Laterality: Left;  . TONSILLECTOMY    . VIDEO BRONCHOSCOPY WITH ENDOBRONCHIAL ULTRASOUND N/A 03/30/2016   Procedure: VIDEO BRONCHOSCOPY WITH ENDOBRONCHIAL Gerrit Heck TRANSBRONCHIAL BIOPSY;  Surgeon: Grace Isaac, MD;  Location: [place];  Service: Thoracic;  Laterality: N/A;     A IV Location/Drains/Wounds Patient Lines/Drains/Airways Status   Active Line/Drains/Airways    Name:   Placement date:   Placement time:   Site:   Days:   Implanted Port 04/10/16 Left Chest   04/10/16    -    Chest   1114   Peripheral IV 03/06/17 Left Antecubital   03/06/17    1351    Antecubital   784   Peripheral IV 04/29/19 Anterior;Right Wrist   04/29/19    1315    Wrist   less than 1   Peripheral IV 04/29/19 Left Antecubital  04/29/19    1457    Antecubital   less than 1   Incision (Closed) 02/11/14 Groin Left   02/11/14    1324     1903   Incision (Closed) 03/27/16 Other (Comment) Right   03/27/16    1454     1128   Incision (Closed) 04/10/16 Chest Left   04/10/16    1219     1114          Intake/Output Last 24 hours  Intake/Output Summary (Last 24 hours) at 04/29/2019 2036 Last data filed at 04/29/2019 1809 Gross per 24 hour  Intake 1600 ml  Output -  Net 1600 ml    Labs/Imaging Results for orders placed or performed during the hospital encounter of 04/29/19 (from the past 48 hour(s))  Lactic acid, plasma     Status: Abnormal   Collection Time:  04/29/19  1:25 PM  Result Value Ref Range   Lactic Acid, Venous 4.7 (HH) 0.5 - 1.9 mmol/L    Comment: CRITICAL RESULT CALLED TO, READ BACK BY AND VERIFIED WITH: RYAN HARDY,RN AT [id number] 04/29/2019 BY ZBEECH. Performed at [facility], [place] [address] [place], [place] [address]   Comprehensive metabolic panel     Status: Abnormal   Collection Time: 04/29/19  1:41 PM  Result Value Ref Range   Sodium 140 135 - 145 mmol/L   Potassium 3.6 3.5 - 5.1 mmol/L   Chloride 108 98 - 111 mmol/L   CO2 19 (L) 22 - 32 mmol/L   Glucose, Bld 120 (H) 70 - 99 mg/dL   BUN 21 8 - 23 mg/dL   Creatinine, Ser 1.43 (H) 0.61 - 1.24 mg/dL   Calcium 8.5 (L) 8.9 - 10.3 mg/dL   Total Protein 5.5 (L) 6.5 - 8.1 g/dL   Albumin 3.4 (L) 3.5 - 5.0 g/dL   AST 28 15 - 41 U/L   ALT 17 0 - 44 U/L   Alkaline Phosphatase 62 38 - 126 U/L   Total Bilirubin 1.3 (H) 0.3 - 1.2 mg/dL   GFR calc non Af Amer 46 (L) >60 mL/min   GFR calc Af Amer 53 (L) >60 mL/min   Anion gap 13 5 - 15    Comment: Performed at [facility], [place] [address] [place], [place] [address]  CBC with Differential     Status: Abnormal   Collection Time: 04/29/19  1:41 PM  Result Value Ref Range   WBC 9.3 4.0 - 10.5 K/uL   RBC 4.13 (L) 4.22 - 5.81 MIL/uL   Hemoglobin 13.7 13.0 - 17.0 g/dL   HCT 42.3 39.0 - 52.0 %   MCV 102.4 (H) 80.0 - 100.0 fL   MCH 33.2 26.0 - 34.0 pg   MCHC 32.4 30.0 - 36.0 g/dL   RDW 13.5 11.5 - 15.5 %   Platelets 140 (L) 150 - 400 K/uL   nRBC 0.0 0.0 - 0.2 %   Neutrophils Relative % 94 %   Neutro Abs 8.7 (H) 1.7 - 7.7 K/uL   Lymphocytes Relative 4 %   Lymphs Abs 0.4 (L) 0.7 - 4.0 K/uL   Monocytes Relative 1 %   Monocytes Absolute 0.1 0.1 - 1.0 K/uL   Eosinophils Relative 0 %   Eosinophils Absolute 0.0 0.0 - 0.5 K/uL   Basophils Relative 0 %   Basophils Absolute 0.0 0.0 - 0.1 K/uL   Immature Granulocytes 1 %   Abs Immature Granulocytes 0.08 (H) 0.00 - 0.07 K/uL  Comment: Performed at [facility],  [place] [address], [place], [place] [address]  SARS Coronavirus 2 [facility] order, Performed in [facility] hospital lab) Nasopharyngeal Nasopharyngeal Swab     Status: None   Collection Time: 04/29/19  3:06 PM   Specimen: Nasopharyngeal Swab  Result Value Ref Range   SARS Coronavirus 2 NEGATIVE NEGATIVE    Comment: (NOTE) If result is NEGATIVE SARS-CoV-2 target nucleic acids are NOT DETECTED. The SARS-CoV-2 RNA is generally detectable in upper and lower  respiratory specimens during the acute phase of infection. The lowest  concentration of SARS-CoV-2 viral copies this assay can detect is 250  copies / mL. A negative result does not preclude SARS-CoV-2 infection  and should not be used as the sole basis for treatment or other  patient management decisions.  A negative result may occur with  improper specimen collection / handling, submission of specimen other  than nasopharyngeal swab, presence of viral mutation(s) within the  areas targeted by this assay, and inadequate number of viral copies  (<250 copies / mL). A negative result must be combined with clinical  observations, patient history, and epidemiological information. If result is POSITIVE SARS-CoV-2 target nucleic acids are DETECTED. The SARS-CoV-2 RNA is generally detectable in upper and lower  respiratory specimens dur ing the acute phase of infection.  Positive  results are indicative of active infection with SARS-CoV-2.  Clinical  correlation with patient history and other diagnostic information is  necessary to determine patient infection status.  Positive results do  not rule out bacterial infection or co-infection with other viruses. If result is PRESUMPTIVE POSTIVE SARS-CoV-2 nucleic acids MAY BE PRESENT.   A presumptive positive result was obtained on the submitted specimen  and confirmed on repeat testing.  While 2019 novel coronavirus  (SARS-CoV-2) nucleic acids may be present in the submitted sample  additional  confirmatory testing may be necessary for epidemiological  and / or clinical management purposes  to differentiate between  SARS-CoV-2 and other Sarbecovirus currently known to infect humans.  If clinically indicated additional testing with an alternate test  methodology [phone]) is advised. The SARS-CoV-2 RNA is generally  detectable in upper and lower respiratory sp ecimens during the acute  phase of infection. The expected result is Negative. Fact Sheet for Patients:  StrictlyIdeas.no Fact Sheet for Healthcare Providers: BankingDealers.co.za This test is not yet approved or cleared by the [place] FDA and has been authorized for detection and/or diagnosis of SARS-CoV-2 by FDA under an Emergency Use Authorization (EUA).  This EUA will remain in effect (meaning this test can be used) for the duration of the COVID-19 declaration under Section 564(b)(1) of the Act, 21 U.S.C. section 360bbb-3(b)(1), unless the authorization is terminated or revoked sooner. Performed at [facility], [place] [address], [place], [place] [address]   Lactic acid, plasma     Status: Abnormal   Collection Time: 04/29/19  3:25 PM  Result Value Ref Range   Lactic Acid, Venous 3.4 (HH) 0.5 - 1.9 mmol/L    Comment: CRITICAL RESULT CALLED TO, READ BACK BY AND VERIFIED WITH: R HARDY,RN 1611 04/29/2019 WBOND Performed at [facility], [place] [address], [place], [place] [address]   Protime-INR     Status: Abnormal   Collection Time: 04/29/19  3:34 PM  Result Value Ref Range   Prothrombin Time 18.3 (H) 11.4 - 15.2 seconds   INR 1.5 (H) 0.8 - 1.2    Comment: (NOTE) INR goal varies  based on device and disease states. Performed at [facility], [place] [address] [place], [place] [address]   APTT     Status: None   Collection Time: 04/29/19  3:34 PM  Result Value Ref Range   aPTT 27 24 - 36 seconds    Comment: Performed at [place] [address], [place], [place] [address]  Urinalysis, Routine w reflex microscopic     Status: Abnormal   Collection Time: 04/29/19  4:15 PM  Result Value Ref Range   Color, Urine YELLOW YELLOW   APPearance CLEAR CLEAR   Specific Gravity, Urine 1.010 1.005 - 1.030   pH 5.0 5.0 - 8.0   Glucose, UA NEGATIVE NEGATIVE mg/dL   Hgb urine dipstick MODERATE (A) NEGATIVE   Bilirubin Urine NEGATIVE NEGATIVE   Ketones, ur 5 (A) NEGATIVE mg/dL   Protein, ur NEGATIVE NEGATIVE mg/dL   Nitrite NEGATIVE NEGATIVE   Leukocytes,Ua MODERATE (A) NEGATIVE   RBC / HPF 0-5 0 - 5 RBC/hpf   WBC, UA 6-10 0 - 5 WBC/hpf   Bacteria, UA RARE (A) NONE SEEN   Squamous Epithelial / LPF 0-5 0 - 5   Mucus PRESENT    Hyaline Casts, UA PRESENT     Comment: Performed at [facility], [address] [place], [place] [address]  Lactic acid, plasma     Status: None   Collection Time: 04/29/19  7:00 PM  Result Value Ref Range   Lactic Acid, Venous 1.4 0.5 - 1.9 mmol/L    Comment: Performed at [place] [address], [place], [place] [address]  Procalcitonin     Status: None   Collection Time: 04/29/19  7:00 PM  Result Value Ref Range   Procalcitonin 32.27 ng/mL    Comment:        Interpretation: PCT >= 10 ng/mL: Important systemic inflammatory response, almost exclusively due to severe bacterial sepsis or septic shock. (NOTE)       Sepsis PCT Algorithm           Lower Respiratory Tract                                      Infection PCT Algorithm    ----------------------------     ----------------------------         PCT < 0.25 ng/mL                PCT < 0.10 ng/mL         Strongly encourage             Strongly discourage   discontinuation of antibiotics    initiation of antibiotics    ----------------------------     -----------------------------       PCT 0.25 - 0.50 ng/mL            PCT 0.10 - 0.25 ng/mL               OR       >80% decrease in PCT            Discourage initiation of                                             antibiotics  Encourage discontinuation           of antibiotics    ----------------------------     -----------------------------         PCT >= 0.50 ng/mL              PCT 0.26 - 0.50 ng/mL                AND       <80% decrease in PCT             Encourage initiation of                                             antibiotics       Encourage continuation           of antibiotics    ----------------------------     -----------------------------        PCT >= 0.50 ng/mL                  PCT > 0.50 ng/mL               AND         increase in PCT                  Strongly encourage                                      initiation of antibiotics    Strongly encourage escalation           of antibiotics                                     -----------------------------                                           PCT <= 0.25 ng/mL                                                 OR                                        > 80% decrease in PCT                                     Discontinue / Do not initiate                                             antibiotics Performed at [facility], [place] [address], [place], [place] [address]    Dg Chest 2 View  Result Date: 04/29/2019 CLINICAL DATA:  6-year-old male with hypotension EXAM: CHEST - 2  VIEW COMPARISON:  Chest radiograph dated 06/16/2016 FINDINGS: Shallow inspiration with bibasilar atelectasis. No focal consolidation, pleural effusion, or pneumothorax. Stable cardiac silhouette. Left pectoral Port-A-Cath in similar position. No acute osseous pathology. IMPRESSION: No active cardiopulmonary disease. Electronically Signed   By: Anner Crete M.D.   On: 04/29/2019 14:33    Pending Labs Unresulted Labs (From admission, onward)    Start     Ordered   04/30/19 [id number]  Basic metabolic panel  Tomorrow morning,   R     04/29/19 1807   04/30/19 0500  CBC  Tomorrow morning,   R     04/29/19 1807    04/29/19 1805  Lactic acid, plasma  STAT Now then every 3 hours,   STAT     04/29/19 1807   04/29/19 1427  Urine culture  ONCE - STAT,   STAT     04/29/19 1427   04/29/19 1325  Culture, blood (routine x 2)  BLOOD CULTURE X 2,   STAT     04/29/19 1327          Vitals/Pain Today's Vitals   04/29/19 1745 04/29/19 1815 04/29/19 1830 04/29/19 1952  BP: (!) 87/54 (!) 80/54 (!) 84/59   Pulse: (!) 102 98 98   Resp: (!) 24 (!) 23 (!) 22   Temp:      TempSrc:      SpO2: 97% 97% 99%   PainSc:    0-No pain    Isolation Precautions No active isolations  Medications Medications  ceFEPIme (MAXIPIME) 2 g in sodium chloride 0.9 % 100 mL IVPB (has no administration in time range)  vancomycin (VANCOCIN) 1,250 mg in sodium chloride 0.9 % 250 mL IVPB (has no administration in time range)  levothyroxine (SYNTHROID) tablet 50 mcg (has no administration in time range)  metroNIDAZOLE (METROGEL) A999333 % gel 1 application (has no administration in time range)  enoxaparin (LOVENOX) injection 40 mg (has no administration in time range)  sodium chloride flush (NS) 0.9 % injection 3 mL (has no administration in time range)  acetaminophen (TYLENOL) tablet 650 mg (has no administration in time range)    Or  acetaminophen (TYLENOL) suppository 650 mg (has no administration in time range)  docusate sodium (COLACE) capsule 100 mg (has no administration in time range)  polyethylene glycol (MIRALAX / GLYCOLAX) packet 17 g (has no administration in time range)  ondansetron (ZOFRAN) tablet 4 mg (has no administration in time range)    Or  ondansetron (ZOFRAN) injection 4 mg (has no administration in time range)  0.9 %  sodium chloride infusion (has no administration in time range)  rosuvastatin (CRESTOR) tablet 20 mg (has no administration in time range)  sodium chloride 0.9 % bolus 1,000 mL (1,000 mLs Intravenous New Bag/Given 04/29/19 1351)  sodium chloride 0.9 % bolus 1,000 mL (0 mLs Intravenous Stopped 04/29/19  1809)  ceFEPIme (MAXIPIME) 2 g in sodium chloride 0.9 % 100 mL IVPB (0 g Intravenous Stopped 04/29/19 1809)  vancomycin (VANCOCIN) 1,750 mg in sodium chloride 0.9 % 500 mL IVPB (0 mg Intravenous Stopped 04/29/19 1809)  sodium chloride 0.9 % bolus 1,000 mL (1,000 mLs Intravenous New Bag/Given 04/29/19 1810)  iohexol (OMNIPAQUE) 300 MG/ML solution 100 mL (100 mLs Intravenous Contrast Given 04/29/19 2014)    Mobility walks High fall risk   Focused Assessments Cardiac Assessment Handoff:    No results found for: CKTOTAL, CKMB, CKMBINDEX, TROPONINI No results found for: DDIMER Does the Patient currently have chest pain? No  R Recommendations: See Admitting Provider Note  Report given to:   Additional Notes:

## 2019-04-29 NOTE — ED Triage Notes (Signed)
Pt BIB [facility] from home. Pt states he had a bowel movement and got up and eased to the ground from weakness. Per EMS pt received 1300 NS and Epi drip. Unable to get BP above 80/40.

## 2019-04-29 NOTE — Plan of Care (Signed)
  Problem: Education: Goal: Knowledge of General Education information will improve Description Including pain rating scale, medication(s)/side effects and non-pharmacologic comfort measures Outcome: Progressing   

## 2019-04-29 NOTE — H&P (Signed)
History and Physical    Seth Gilbert [id number] DOB: [birth year] DOA: 04/29/2019  PCP: Lawerance Cruel, MD Consultants:  Julien Nordmann - oncology Patient coming from:  Home - lives with wife; NOK: Wife  Chief Complaint: Weakness  HPI: Seth Gilbert is a 83 y.o. male with medical history significant of NKL presenting with weakness.  He reports that he was fine yesterday and awoke about 0300 with chills/rigors.  Denies fever.  He went back to bed but continued to experience this.  He finally got out of bed about 0830 due to B low back pain which he attributed to thrashing about due to rigors.  He went to the bathroom but was too weak to stand and so they called 911.  No fever.  No urinary symptoms.  No cough/SOB.  He has had a rash on his R face associated with scratching his rosacea; he was given Metrogel and Amoxil and has been taking them since Friday.  He also has a decayed R lower molar with an implant replaced last Monday, but he is not having pain from the tooth or along the jaw.   ED Course: Patient with chills this AM - fatigue, rigors, nausea.  Vagal episode on the commode at home.  Appears to have septic shock here - SBP in 80s with EMS, given IVF and Epi drip.  Taken off Epi drip and given sepsis bolus IVF.  No respiratory symptoms, no COVID symptoms, UA is pending but no symptoms.  PCCM (Dr. Halford Chessman) suggests hospitalist admission.  Review of Systems: As per HPI; otherwise review of systems reviewed and negative.   Ambulatory Status:  Ambulates without assistance  Past Medical History:  Diagnosis Date  . Diverticulitis   . GERD (gastroesophageal reflux disease)    Heartburn at times  . Heart murmur    "comes and goes" onset 4th grade - last time anyone heard it was 01/2014  . History of hiatal hernia   . History of kidney stones   . HOH (hard of hearing)   . Non-Hodgkin lymphoma ([place])   . Peripheral neuropathy 09/11/2016  . PONV (postoperative nausea and vomiting)   .  Rosacea   . Wears glasses     Past Surgical History:  Procedure Laterality Date  . APPENDECTOMY     2-3 yrs old  . COLONOSCOPY    . HEMORROIDECTOMY     inflammed anal gland removed  . INGUINAL HERNIA REPAIR Left 02/11/2014   Procedure: LEFT INGUINAL HERNIA REPAIR WITH MESH ;  Surgeon: Joyice Faster. Cornett, MD;  Location: [place];  Service: General;  Laterality: Left;  . INSERTION OF MESH N/A 02/11/2014   Procedure: INSERTION OF MESH;  Surgeon: Joyice Faster. Cornett, MD;  Location: [place];  Service: General;  Laterality: N/A;  . PORTACATH PLACEMENT Left 04/10/2016   Procedure: INSERTION PORT-A-CATH into LEFT Subclavian Vein with guidance from ultrasound and flouroscopy;  Surgeon: Grace Isaac, MD;  Location: [place];  Service: Thoracic;  Laterality: Left;  . TONSILLECTOMY    . VIDEO BRONCHOSCOPY WITH ENDOBRONCHIAL ULTRASOUND N/A 03/30/2016   Procedure: VIDEO BRONCHOSCOPY WITH ENDOBRONCHIAL ULTRASOUND,WITH TRANSBRONCHIAL BIOPSY;  Surgeon: Grace Isaac, MD;  Location: [place];  Service: Thoracic;  Laterality: N/A;    Social History   Socioeconomic History  . Marital status: Married    Spouse name: Not on file  . Number of children: 2  . Years of education: BS  . Highest education level: Not on file  Occupational History  . Occupation: Retired  Scientific laboratory technician  . Financial resource strain: Not on file  . Food insecurity    Worry: Not on file    Inability: Not on file  . Transportation needs    Medical: Not on file    Non-medical: Not on file  Tobacco Use  . Smoking status: Never Smoker  . Smokeless tobacco: Never Used  Substance and Sexual Activity  . Alcohol use: No    Comment: occasional- a couple times a month  . Drug use: No  . Sexual activity: Not on file  Lifestyle  . Physical activity    Days per week: Not on file    Minutes per session: Not on file  . Stress: Not on file  Relationships  . Social Herbalist on phone: Not  on file    Gets together: Not on file    Attends religious service: Not on file    Active member of club or organization: Not on file    Attends meetings of clubs or organizations: Not on file    Relationship status: Not on file  . Intimate partner violence    Fear of current or ex partner: Not on file    Emotionally abused: Not on file    Physically abused: Not on file    Forced sexual activity: Not on file  Other Topics Concern  . Not on file  Social History Narrative   Lives at home w/ his wife   Right-handed   Caffeine: 1 cup of coffee    Allergies  Allergen Reactions  . No Known Allergies     Family History  Problem Relation Age of Onset  . COPD Mother   . Heart disease Father   . Neuropathy Neg Hx     Prior to Admission medications   Medication Sig Start Date End Date Taking? Authorizing Provider  ampicillin (PRINCIPEN) 500 MG capsule Take 500 mg by mouth every other day.     [provider]  famotidine (PEPCID) 40 MG tablet Take 40 mg by mouth daily as needed for heartburn.     [provider]  Glucosamine HCl (GLUCOSAMINE PO) Take 1,800 mg by mouth 3 (three) times daily.    [provider]  levothyroxine (SYNTHROID, LEVOTHROID) 50 MCG tablet Take 1 tablet by mouth daily. 03/14/18   [provider]  lidocaine-prilocaine (EMLA) cream Apply 1 application topically as needed. 09/11/17   Maryanna Shape, NP  metroNIDAZOLE (METROGEL) 0.75 % gel Apply 1 application topically daily.  12/04/13   [provider]  Multiple Vitamins-Minerals (CENTRUM SILVER PO) Take 1 tablet by mouth daily.     [provider]  senna (SENOKOT) 8.6 MG tablet Take 1 tablet by mouth daily as needed for constipation.    [provider]  vitamin E 400 UNIT capsule Take 400 Units by mouth daily.    [provider]    Physical Exam: Vitals:   04/29/19 1545 04/29/19 1600 04/29/19 1715 04/29/19 1745  BP: 99/66 97/80 (!) 94/57 (!)  87/54  Pulse: (!) 103 (!) 109 (!) 109 (!) 102  Resp: (!) 23 19 (!) 30 (!) 24  Temp:      TempSrc:      SpO2: 99% 100% 100% 97%     . General:  Appears calm and comfortable and is NAD, mildly ill-appearing . Eyes:  PERRL, EOMI, normal lids, iris . ENT:  grossly normal hearing, lips & tongue, mmm;  appropriate dentition with decayed right posterior molar but no apparent drainage, erythema, TTP . Neck:  no LAD, masses or thyromegaly . Cardiovascular:  RR with mild tachycardia, no m/r/g. No LE edema.  Marland Kitchen Respiratory:   CTA bilaterally with scant bibasilar rhonchi.  Normal respiratory effort. . Abdomen:  soft, NT, ND, NABS . Back:   normal alignment, no CVAT . Skin:  Mildly excoriated rash along R maxilla . Musculoskeletal:  grossly normal tone BUE/BLE, good ROM, no bony abnormality . Psychiatric:  grossly normal mood and affect, speech fluent and appropriate, AOx3 . Neurologic:  CN 2-12 grossly intact, moves all extremities in coordinated fashion, sensation intact    Radiological Exams on Admission: Dg Chest 2 View  Result Date: 04/29/2019 CLINICAL DATA:  3-year-old male with hypotension EXAM: CHEST - 2 VIEW COMPARISON:  Chest radiograph dated 06/16/2016 FINDINGS: Shallow inspiration with bibasilar atelectasis. No focal consolidation, pleural effusion, or pneumothorax. Stable cardiac silhouette. Left pectoral Port-A-Cath in similar position. No acute osseous pathology. IMPRESSION: No active cardiopulmonary disease. Electronically Signed   By: Anner Crete M.D.   On: 04/29/2019 14:33    EKG: Independently reviewed.  NSR with rate 98; RBBB that is new since last tracing     Labs on Admission: I have personally reviewed the available labs and imaging studies at the time of the admission.  Pertinent labs:   CO2 19 Glucose 120 BUN 21/Creatinine 1.43/GFR 46; baseline creatinine about 1.1 Albumin 3.4 Bili 1.3 WBC 9.3 Platelets 140 INR 1.5 COVID negative UA: 5 ketones, moderate LE,  rare bacteria Blood and urine cultures pending  Assessment/Plan Principal Problem:   Septic shock due to undetermined organism [facility]) Active Problems:   Diffuse large B-cell lymphoma of lymph nodes of multiple regions (HCC)   Septic shock -SIRS criteria in this patient includes: tachycardia, tachypnea  -Patient has evidence of acute organ failure with elevated lactate to 3.2 and borderline hypotension -While awaiting blood cultures, this appears to be a preseptic condition. -Sepsis protocol initiated -Patient had initial lactate >4 and SBP <90/MAP <65 and so has received the 30 cc/kg IVF bolus. -He has continued to have intermittent hypotension which does respond to IVF; as a result, I requested that the ER consult PCCM and they have declined admission at this time. -Suspected source is unknown - possible early PNA, possible generally asymptomatic UTI?  Will obtain CT C/A/P with contrast to further evaluate. -Blood and urine cultures pending -Will admit to progressive care unit due to hemodynamic instability -Treat with IV Cefepime/Vanc for undifferentiated sepsis -Will trend lactate to ensure improvement -Will order sepsis procalcitonin level.  Antibiotics would not be indicated for PCT <0.1 and probably should not be used for < 0.25.  >0.5 indicates infection and >>0.5 indicates more serious disease.  As the procalcitonin level normalizes, it will be reasonable to consider de-escalation of antibiotic coverage.  NHL -Stage 2 large B-cell NHL in 8/17 -Treated with CHOP/Rituxan every 3 weeks with Neulasta with 6 cycles, completed 07/2016 -He has been on observation since then -Last seen by Dr. Julien Nordmann on 03/17/19 -Negative C/A/P CT on 7/15    Note: This patient has been tested and is negative for the novel coronavirus COVID-19.   DVT prophylaxis:  Lovenox Code Status:  Full - confirmed with patient/family Family Communication: Wife present for the completion of the evaluation   Disposition Plan:  Home once clinically improved Consults called: None  Admission status: Admit - It is my clinical opinion that admission to INPATIENT is reasonable  and necessary because of the expectation that this patient will require hospital care that crosses at least 2 midnights to treat this condition based on the medical complexity of the problems presented.  Given the aforementioned information, the predictability of an adverse outcome is felt to be significant.     Karmen Bongo MD [facility]   How to contact the [facility] Attending or Consulting provider [place] or covering provider during after hours [place], for this patient?  1. Check the care team in [facility] and look for a) attending/consulting [facility] provider listed and b) the [facility] team listed 2. Log into [email or web address] and use [place]'s universal password to access. If you do not have the password, please contact the hospital operator. 3. Locate the [facility] provider you are looking for under [facility] and page to a number that you can be directly reached. 4. If you still have difficulty reaching the provider, please page the [facility] (Director on Call) for the Hospitalists listed on amion for assistance.   04/29/2019, 6:13 PM

## 2019-04-30 ENCOUNTER — Inpatient Hospital Stay (HOSPITAL_COMMUNITY): Payer: Medicare Other

## 2019-04-30 DIAGNOSIS — C8338 Diffuse large B-cell lymphoma, lymph nodes of multiple sites: Secondary | ICD-10-CM

## 2019-04-30 DIAGNOSIS — I9589 Other hypotension: Secondary | ICD-10-CM

## 2019-04-30 DIAGNOSIS — G6289 Other specified polyneuropathies: Secondary | ICD-10-CM

## 2019-04-30 DIAGNOSIS — N179 Acute kidney failure, unspecified: Secondary | ICD-10-CM

## 2019-04-30 DIAGNOSIS — K5732 Diverticulitis of large intestine without perforation or abscess without bleeding: Secondary | ICD-10-CM

## 2019-04-30 DIAGNOSIS — A4151 Sepsis due to Escherichia coli [E. coli]: Principal | ICD-10-CM

## 2019-04-30 LAB — BASIC METABOLIC PANEL
Anion gap: 8 (ref 5–15)
BUN: 20 mg/dL (ref 8–23)
CO2: 19 mmol/L — ABNORMAL LOW (ref 22–32)
Calcium: 7.9 mg/dL — ABNORMAL LOW (ref 8.9–10.3)
Chloride: 114 mmol/L — ABNORMAL HIGH (ref 98–111)
Creatinine, Ser: 1.16 mg/dL (ref 0.61–1.24)
GFR calc Af Amer: >60 mL/min (ref >60)
GFR calc non Af Amer: 59 mL/min — ABNORMAL LOW (ref >60)
Glucose, Bld: 90 mg/dL (ref 70–99)
Potassium: 4.2 mmol/L (ref 3.5–5.1)
Sodium: 141 mmol/L (ref 135–145)

## 2019-04-30 LAB — BLOOD CULTURE ID PANEL (REFLEXED)

## 2019-04-30 LAB — CBC
HCT: 37 % — ABNORMAL LOW (ref 39.0–52.0)
Hemoglobin: 12.3 g/dL — ABNORMAL LOW (ref 13.0–17.0)
MCH: 33.6 pg (ref 26.0–34.0)
MCHC: 33.2 g/dL (ref 30.0–36.0)
MCV: 101.1 fL — ABNORMAL HIGH (ref 80.0–100.0)
Platelets: 123 10*3/uL — ABNORMAL LOW (ref 150–400)
RBC: 3.66 MIL/uL — ABNORMAL LOW (ref 4.22–5.81)
RDW: 14.3 % (ref 11.5–15.5)
WBC: 15.7 10*3/uL — ABNORMAL HIGH (ref 4.0–10.5)
nRBC: 0 % (ref 0.0–0.2)

## 2019-04-30 LAB — LACTIC ACID, PLASMA
Lactic Acid, Venous: 3.2 mmol/L (ref 0.5–1.9)
Lactic Acid, Venous: 1.3 mmol/L (ref 0.5–1.9)

## 2019-04-30 LAB — URINE CULTURE

## 2019-04-30 LAB — BRAIN NATRIURETIC PEPTIDE: B Natriuretic Peptide: 402.6 pg/mL — ABNORMAL HIGH (ref 0.0–100.0)

## 2019-04-30 LAB — TSH: TSH: 2.952 u[IU]/mL (ref 0.350–4.500)

## 2019-04-30 LAB — ECHOCARDIOGRAM COMPLETE

## 2019-04-30 LAB — PROCALCITONIN: Procalcitonin: 35.72 ng/mL

## 2019-04-30 MED ORDER — SODIUM CHLORIDE 0.9 % IV SOLN
2.0000 g | INTRAVENOUS | Status: DC
  Administered 2019-05-01 – 2019-05-02 (×2): 2 g via INTRAVENOUS
  Filled 2019-04-30: qty 2
  Filled 2019-04-30: qty 20
  Filled 2019-04-30: qty 2

## 2019-04-30 MED ORDER — METRONIDAZOLE IN NACL 5-0.79 MG/ML-% IV SOLN
500.0000 mg | Freq: Three times a day (TID) | INTRAVENOUS | Status: DC
  Administered 2019-04-30 – 2019-05-01 (×2): 500 mg via INTRAVENOUS
  Filled 2019-04-30 (×2): qty 100

## 2019-04-30 NOTE — Progress Notes (Signed)
Pharmacy - Antimicrobial Stewardship  Patient BCID resulted with E.coli in 1/2 blood cultures. Per our protocol, ceftriaxone is the appropriate first-line treatment. This patient is currently receiving cefepime empirically. Dr. Maren Beach confirmed that it was acceptable to now switch to ceftriaxone. Cefepime has been discontinued and ceftriaxone 2g IV q24hr has been ordered.   Thank you for involving pharmacy in the care of this patient.   Agnes Lawrence, PharmD PGY1 Pharmacy Resident

## 2019-04-30 NOTE — Progress Notes (Addendum)
PROGRESS NOTE    Seth Gilbert  KZS:[id number] DOB: 1935/11/05 DOA: 04/29/2019 PCP: Lawerance Cruel, MD   Brief Narrative: As per HPI 83 y.o. male with medical history significant of NHL presenting with weakness, chills/rigors. He woke around 0300 with chills/rigors but no fever. He went back to bed but continued to experience this.  He finally got out of bed about 0830 due to low back pain which he attributed to thrashing about due to rigors.  He went to the bathroom but was too weak to stand and so they called 911.  No fever.  No urinary symptoms.  No cough/SOB. He had vagal episode on the commode at home.  He has had a rash on his R face associated with scratching his rosacea; he was given Metrogel and Amoxil and has been taking them since Friday.  He also has a decayed R lower molar with an implant replaced last Monday, but he denies any tooth or jaw pain currently.   In the ER   Appeared to have septic shock here - SBP in 80s with EMS, given IVF and Epi drip.  Taken off Epi drip and given sepsis bolus IVF.  No respiratory symptoms, no COVID symptoms, UA abnormal, patient was admitted, CT abdomen pelvis don. PCCM (Dr. Halford Chessman) suggested hospitalist admission  Subjective: He is hard of hearing AAOX3, having breakfast, no new complaints- denies any fever, chills, COUGH, SOB, chest pain abdominal pain, nausea vomitting. He denied any dysurea- frequency, back pain. BP has beein 80s-90s, this am 93/54- MAP at 66 Afebrile overnight, WBC bumped to 15.7  Assessment & Plan:   Septic shock due to Escherichia coli (E. Coli)  POA: 1 out of 2 bottles growing E. Coli: ?source- could be  Mild acute sigmoid diverticulitis vs UTI. urine culture with multiple species suggesting recollection, recheck urine culture.  Denies any abdominal pain. IInitially on Vanco and cefepime will stop vancomycin/Cefepime continue on ceftriaxone, add flagyl for diverticulitis.Lactic acid repeated this morning and has resolved to  1.3.  Pressure remains soft in 90s map in the 60s patient appears to be doing fine alert awake oriented without dizziness.  Since BP soft BNP marginally up, check echo. Cont IVF at 125 ml/hrr  Mild acute sigmoid diverticulitis seen in CT- patient denies any abdominal pain-was tolerating diet.will keep on low fiber diet-added flagyl.  Lactic acidosis: From sepsis as above, resolved.  AKI with creatinine 1.43- from sepsis- improved with IV fluids.  Rosacea continue home ointment, for intermittent  flareups he takes amoxicillin.  HLD continue statin  Hypothyroidism continue levothyroxine, TSH stable  Peripheral neuropathy: No issues.  NHL chemo completed in 07/2006- has had several PET scnas and CT sicne then and in remission as per patient  DVT prophylaxis:lovenox Code Status: Full code Family Communication: plan of care discussed with patient in detail. Disposition Plan: Remains inpatient pending clinical improvement.  Obtain PT eval OT tomorrow morning once hemodynamically stable  Consultants:  PCCM from ER  Procedures:  CT abdomen/chest:Left colonic diverticulosis. Mild acute sigmoid diverticulitis.  None  Microbiology:  Blood cx- e coli, urine cx mixed  Antimicrobials: Anti-infectives (From admission, onward)   Start     Dose/Rate Route Frequency Ordered Stop   04/30/19 1500  vancomycin (VANCOCIN) 1,250 mg in sodium chloride 0.9 % 250 mL IVPB     1,250 mg 166.7 mL/hr over 90 Minutes Intravenous Every 24 hours 04/29/19 1507     04/30/19 0300  ceFEPIme (MAXIPIME) 2 g in sodium chloride 0.9 %  100 mL IVPB     2 g 200 mL/hr over 30 Minutes Intravenous Every 12 hours 04/29/19 1507     04/29/19 1445  ceFEPIme (MAXIPIME) 2 g in sodium chloride 0.9 % 100 mL IVPB     2 g 200 mL/hr over 30 Minutes Intravenous  Once 04/29/19 1431 04/29/19 1809   04/29/19 1445  vancomycin (VANCOCIN) 1,750 mg in sodium chloride 0.9 % 500 mL IVPB     1,750 mg 250 mL/hr over 120 Minutes Intravenous   Once 04/29/19 1434 04/29/19 1809   04/29/19 1430  vancomycin (VANCOCIN) IVPB 1000 mg/200 mL premix  Status:  Discontinued     1,000 mg 200 mL/hr over 60 Minutes Intravenous  Once 04/29/19 1429 04/29/19 1434   04/29/19 1430  ceFEPIme (MAXIPIME) 500 mg in dextrose 5 % 50 mL IVPB  Status:  Discontinued     500 mg 100 mL/hr over 30 Minutes Intravenous  Once 04/29/19 1429 04/29/19 1431       Objective: Vitals:   04/30/19 0009 04/30/19 0300 04/30/19 0311 04/30/19 0745  BP: (!) 86/50  (!) 94/53 (!) 92/58  Pulse: 90 84 86 76  Resp: (!) 23 (!) 23 (!) 22 18  Temp: 99.1 F (37.3 C)  99.5 F (37.5 C) 97.8 F (36.6 C)  TempSrc: Oral  Oral Oral  SpO2: 99% 96% 97% 98%    Intake/Output Summary (Last 24 hours) at 04/30/2019 0802 Last data filed at 04/30/2019 0300 Gross per 24 hour  Intake 2209.47 ml  Output 350 ml  Net 1859.47 ml   There were no vitals filed for this visit. Weight change:   There is no height or weight on file to calculate BMI.  Intake/Output from previous day: 09/02 0701 - 09/03 0700 In: 2209.5 [I.V.:509.5; IV Piggyback:1700] Out: 350 [Urine:350] Intake/Output this shift: No intake/output data recorded.  Examination:  General exam: Appears calm and comfortable,Not in distress, older fore the age HEENT:PERRL,Oral mucosa moist, Ear/Nose normal on gross exam Respiratory system: Bilateral equal air entry, normal vesicular breath sounds, no wheezes or crackles  Cardiovascular system: S1 & S2 heard,No JVD, murmurs. Gastrointestinal system: Abdomen is  soft, non tender, non distended, BS +  Nervous System:Alert and oriented. No focal neurological deficits/moving extremities, sensation intact. Extremities: No edema, no clubbing, distal peripheral pulses palpable. Skin: No rashes, lesions, no icterus MSK: Normal muscle bulk,tone ,power  Medications:  Scheduled Meds:  docusate sodium  100 mg Oral BID   enoxaparin (LOVENOX) injection  40 mg Subcutaneous Q24H    levothyroxine  50 mcg Oral QAC breakfast   metroNIDAZOLE  1 application Topical Daily   rosuvastatin  20 mg Oral Daily   sodium chloride flush  3 mL Intravenous Q12H   Continuous Infusions:  sodium chloride 100 mL/hr at 04/30/19 0300   ceFEPime (MAXIPIME) IV Stopped (04/30/19 0250)   vancomycin      Data Reviewed: I have personally reviewed following labs and imaging studies  CBC: Recent Labs  Lab 04/29/19 1341 04/30/19 0244  WBC 9.3 15.7*  NEUTROABS 8.7*  --   HGB 13.7 12.3*  HCT 42.3 37.0*  MCV 102.4* 101.1*  PLT 140* 244*   Basic Metabolic Panel: Recent Labs  Lab 04/29/19 1341 04/30/19 0244  NA 140 141  K 3.6 4.2  CL 108 114*  CO2 19* 19*  GLUCOSE 120* 90  BUN 21 20  CREATININE 1.43* 1.16  CALCIUM 8.5* 7.9*   GFR: CrCl cannot be calculated (Unknown ideal weight.). Liver Function Tests: Recent  Labs  Lab 04/29/19 1341  AST 28  ALT 17  ALKPHOS 62  BILITOT 1.3*  PROT 5.5*  ALBUMIN 3.4*   No results for input(s): LIPASE, AMYLASE in the last 168 hours. No results for input(s): AMMONIA in the last 168 hours. Coagulation Profile: Recent Labs  Lab 04/29/19 1534  INR 1.5*   Cardiac Enzymes: No results for input(s): CKTOTAL, CKMB, CKMBINDEX, TROPONINI in the last 168 hours. BNP (last 3 results) No results for input(s): PROBNP in the last 8760 hours. HbA1C: No results for input(s): HGBA1C in the last 72 hours. CBG: No results for input(s): GLUCAP in the last 168 hours. Lipid Profile: No results for input(s): CHOL, HDL, LDLCALC, TRIG, CHOLHDL, LDLDIRECT in the last 72 hours. Thyroid Function Tests: No results for input(s): TSH, T4TOTAL, FREET4, T3FREE, THYROIDAB in the last 72 hours. Anemia Panel: No results for input(s): VITAMINB12, FOLATE, FERRITIN, TIBC, IRON, RETICCTPCT in the last 72 hours. Sepsis Labs: Recent Labs  Lab 04/29/19 1325 04/29/19 1525 04/29/19 1900 04/29/19 2324  PROCALCITON  --   --  32.27  --   LATICACIDVEN 4.7* 3.4*  1.4 3.2*    Recent Results (from the past 240 hour(s))  Culture, blood (routine x 2)     Status: None (Preliminary result)   Collection Time: 04/29/19  1:41 PM   Specimen: BLOOD  Result Value Ref Range Status   Specimen Description BLOOD LEFT ANTECUBITAL  Final   Special Requests   Final    BOTTLES DRAWN AEROBIC AND ANAEROBIC Blood Culture adequate volume   Culture  Setup Time   Final    GRAM NEGATIVE RODS ANAEROBIC BOTTLE ONLY Organism ID to follow CRITICAL RESULT CALLED TO, READ BACK BY AND VERIFIED WITH: Cottie Banda [id number] MLM Performed at [facility], [place] [address] [place], [place] [address]    Culture GRAM NEGATIVE RODS  Final   Report Status PENDING  Incomplete  Blood Culture ID Panel (Reflexed)     Status: Abnormal   Collection Time: 04/29/19  1:41 PM  Result Value Ref Range Status   Enterococcus species NOT DETECTED NOT DETECTED Final   Listeria monocytogenes NOT DETECTED NOT DETECTED Final   Staphylococcus species NOT DETECTED NOT DETECTED Final   Staphylococcus aureus (BCID) NOT DETECTED NOT DETECTED Final   Streptococcus species NOT DETECTED NOT DETECTED Final   Streptococcus agalactiae NOT DETECTED NOT DETECTED Final   Streptococcus pneumoniae NOT DETECTED NOT DETECTED Final   Streptococcus pyogenes NOT DETECTED NOT DETECTED Final   Acinetobacter baumannii NOT DETECTED NOT DETECTED Final   Enterobacteriaceae species DETECTED (A) NOT DETECTED Final    Comment: Enterobacteriaceae represent a large family of gram-negative bacteria, not a single organism. CRITICAL RESULT CALLED TO, READ BACK BY AND VERIFIED WITH: PHARMD L SEAY [id number] MLM    Enterobacter cloacae complex NOT DETECTED NOT DETECTED Final   Escherichia coli DETECTED (A) NOT DETECTED Final    Comment: CRITICAL RESULT CALLED TO, READ BACK BY AND VERIFIED WITH: PHARMD L SEAY [id number] MLM    Klebsiella oxytoca NOT DETECTED NOT DETECTED Final   Klebsiella pneumoniae NOT DETECTED NOT  DETECTED Final   Proteus species NOT DETECTED NOT DETECTED Final   Serratia marcescens NOT DETECTED NOT DETECTED Final   Carbapenem resistance NOT DETECTED NOT DETECTED Final   Haemophilus influenzae NOT DETECTED NOT DETECTED Final   Neisseria meningitidis NOT DETECTED NOT DETECTED Final   Pseudomonas aeruginosa NOT DETECTED NOT DETECTED Final   Candida albicans NOT DETECTED  NOT DETECTED Final   Candida glabrata NOT DETECTED NOT DETECTED Final   Candida krusei NOT DETECTED NOT DETECTED Final   Candida parapsilosis NOT DETECTED NOT DETECTED Final   Candida tropicalis NOT DETECTED NOT DETECTED Final    Comment: Performed at [facility], [place] [address] [place], [place] [address]  SARS Coronavirus 2 [facility] order, Performed in [facility] hospital lab) Nasopharyngeal Nasopharyngeal Swab     Status: None   Collection Time: 04/29/19  3:06 PM   Specimen: Nasopharyngeal Swab  Result Value Ref Range Status   SARS Coronavirus 2 NEGATIVE NEGATIVE Final    Comment: (NOTE) If result is NEGATIVE SARS-CoV-2 target nucleic acids are NOT DETECTED. The SARS-CoV-2 RNA is generally detectable in upper and lower  respiratory specimens during the acute phase of infection. The lowest  concentration of SARS-CoV-2 viral copies this assay can detect is 250  copies / mL. A negative result does not preclude SARS-CoV-2 infection  and should not be used as the sole basis for treatment or other  patient management decisions.  A negative result may occur with  improper specimen collection / handling, submission of specimen other  than nasopharyngeal swab, presence of viral mutation(s) within the  areas targeted by this assay, and inadequate number of viral copies  (<250 copies / mL). A negative result must be combined with clinical  observations, patient history, and epidemiological information. If result is POSITIVE SARS-CoV-2 target nucleic acids are DETECTED. The SARS-CoV-2 RNA is generally detectable  in upper and lower  respiratory specimens dur ing the acute phase of infection.  Positive  results are indicative of active infection with SARS-CoV-2.  Clinical  correlation with patient history and other diagnostic information is  necessary to determine patient infection status.  Positive results do  not rule out bacterial infection or co-infection with other viruses. If result is PRESUMPTIVE POSTIVE SARS-CoV-2 nucleic acids MAY BE PRESENT.   A presumptive positive result was obtained on the submitted specimen  and confirmed on repeat testing.  While 2019 novel coronavirus  (SARS-CoV-2) nucleic acids may be present in the submitted sample  additional confirmatory testing may be necessary for epidemiological  and / or clinical management purposes  to differentiate between  SARS-CoV-2 and other Sarbecovirus currently known to infect humans.  If clinically indicated additional testing with an alternate test  methodology [phone]) is advised. The SARS-CoV-2 RNA is generally  detectable in upper and lower respiratory sp ecimens during the acute  phase of infection. The expected result is Negative. Fact Sheet for Patients:  StrictlyIdeas.no Fact Sheet for Healthcare Providers: BankingDealers.co.za This test is not yet approved or cleared by the [place] FDA and has been authorized for detection and/or diagnosis of SARS-CoV-2 by FDA under an Emergency Use Authorization (EUA).  This EUA will remain in effect (meaning this test can be used) for the duration of the COVID-19 declaration under Section 564(b)(1) of the Act, 21 U.S.C. section 360bbb-3(b)(1), unless the authorization is terminated or revoked sooner. Performed at [facility], [place] [address], [place], [place] [address]   MRSA PCR Screening     Status: None   Collection Time: 04/29/19  9:15 PM   Specimen: Nasal Mucosa; Nasopharyngeal  Result Value Ref Range Status   MRSA  by PCR NEGATIVE NEGATIVE Final    Comment:        The GeneXpert MRSA Assay (FDA approved for NASAL specimens only), is one component of a comprehensive MRSA colonization surveillance program. It  is not intended to diagnose MRSA infection nor to guide or monitor treatment for MRSA infections. Performed at [facility], [place] [address], [place], [place] [address]       Radiology Studies: Dg Chest 2 View  Result Date: 04/29/2019 CLINICAL DATA:  29-year-old male with hypotension EXAM: CHEST - 2 VIEW COMPARISON:  Chest radiograph dated 06/16/2016 FINDINGS: Shallow inspiration with bibasilar atelectasis. No focal consolidation, pleural effusion, or pneumothorax. Stable cardiac silhouette. Left pectoral Port-A-Cath in similar position. No acute osseous pathology. IMPRESSION: No active cardiopulmonary disease. Electronically Signed   By: Anner Crete M.D.   On: 04/29/2019 14:33   Ct Chest W Contrast  Result Date: 04/29/2019 CLINICAL DATA:  Hypotension. Weakness. EXAM: CT CHEST, ABDOMEN, AND PELVIS WITH CONTRAST TECHNIQUE: Multidetector CT imaging of the chest, abdomen and pelvis was performed following the standard protocol during bolus administration of intravenous contrast. CONTRAST:  190m OMNIPAQUE IOHEXOL 300 MG/ML  SOLN COMPARISON:  June 22, 2016 FINDINGS: CT CHEST FINDINGS Cardiovascular: Normal heart size. Minimal pericardial effusion. Calcific atherosclerotic disease of the coronary arteries. Tortuosity and mild calcific atherosclerotic disease of the aorta. Mediastinum/Nodes: Small hiatal hernia. No evidence of lymphadenopathy. Lungs/Pleura: Lungs are clear. No pleural effusion or pneumothorax. Mild lingular scarring. Musculoskeletal: No chest wall mass or suspicious bone lesions identified. CT ABDOMEN PELVIS FINDINGS Hepatobiliary: No focal liver abnormality is seen. No gallstones, gallbladder wall thickening, or biliary dilatation. Pancreas: Unremarkable. No pancreatic ductal  dilatation or surrounding inflammatory changes. Spleen: Normal in size without focal abnormality. Adrenals/Urinary Tract: Adrenal glands are unremarkable. Kidneys are normal, without renal calculi, focal lesion, or hydronephrosis. Bladder is unremarkable. Stomach/Bowel: Stomach is within normal limits. No evidence of appendicitis. No evidence of small bowel wall thickening, distention, or inflammatory changes. Left colonic diverticulosis. Long segment of mucosal thickening of the sigmoid colon with minimal pericolonic inflammatory stranding may represent an element of acute mild diverticulitis. Vascular/Lymphatic: Aortic atherosclerosis. No enlarged abdominal or pelvic lymph nodes. Reproductive: Coarse calcifications of the prostate gland. Other: No abdominal wall hernia or abnormality. No abdominopelvic ascites. Musculoskeletal: No acute osseous findings. Spondylosis of the spine. IMPRESSION: 1. Left colonic diverticulosis. Mild acute sigmoid diverticulitis. 2. Small hiatal hernia. 3. Calcific atherosclerotic disease of the coronary arteries and aorta. 4. Minimal pericardial effusion. 5. Otherwise no evidence of acute abnormalities within the chest, abdomen or pelvis. Electronically Signed   By: DFidela SalisburyM.D.   On: 04/29/2019 20:48   Ct Abdomen Pelvis W Contrast  Result Date: 04/29/2019 CLINICAL DATA:  Hypotension. Weakness. EXAM: CT CHEST, ABDOMEN, AND PELVIS WITH CONTRAST TECHNIQUE: Multidetector CT imaging of the chest, abdomen and pelvis was performed following the standard protocol during bolus administration of intravenous contrast. CONTRAST:  1064mOMNIPAQUE IOHEXOL 300 MG/ML  SOLN COMPARISON:  June 22, 2016 FINDINGS: CT CHEST FINDINGS Cardiovascular: Normal heart size. Minimal pericardial effusion. Calcific atherosclerotic disease of the coronary arteries. Tortuosity and mild calcific atherosclerotic disease of the aorta. Mediastinum/Nodes: Small hiatal hernia. No evidence of  lymphadenopathy. Lungs/Pleura: Lungs are clear. No pleural effusion or pneumothorax. Mild lingular scarring. Musculoskeletal: No chest wall mass or suspicious bone lesions identified. CT ABDOMEN PELVIS FINDINGS Hepatobiliary: No focal liver abnormality is seen. No gallstones, gallbladder wall thickening, or biliary dilatation. Pancreas: Unremarkable. No pancreatic ductal dilatation or surrounding inflammatory changes. Spleen: Normal in size without focal abnormality. Adrenals/Urinary Tract: Adrenal glands are unremarkable. Kidneys are normal, without renal calculi, focal lesion, or hydronephrosis. Bladder is unremarkable. Stomach/Bowel: Stomach is within normal limits. No evidence of appendicitis. No  evidence of small bowel wall thickening, distention, or inflammatory changes. Left colonic diverticulosis. Long segment of mucosal thickening of the sigmoid colon with minimal pericolonic inflammatory stranding may represent an element of acute mild diverticulitis. Vascular/Lymphatic: Aortic atherosclerosis. No enlarged abdominal or pelvic lymph nodes. Reproductive: Coarse calcifications of the prostate gland. Other: No abdominal wall hernia or abnormality. No abdominopelvic ascites. Musculoskeletal: No acute osseous findings. Spondylosis of the spine. IMPRESSION: 1. Left colonic diverticulosis. Mild acute sigmoid diverticulitis. 2. Small hiatal hernia. 3. Calcific atherosclerotic disease of the coronary arteries and aorta. 4. Minimal pericardial effusion. 5. Otherwise no evidence of acute abnormalities within the chest, abdomen or pelvis. Electronically Signed   By: Fidela Salisbury M.D.   On: 04/29/2019 20:48      LOS: 1 day   Time spent: More than 50% of that time was spent in counseling and/or coordination of care.  Antonieta Pert, MD [facility]  04/30/2019, 8:02 AM

## 2019-04-30 NOTE — Progress Notes (Signed)
Echocardiogram 2D Echocardiogram has been performed.  Oneal Deputy Ryun Velez 04/30/2019, 9:40 AM

## 2019-04-30 NOTE — Progress Notes (Signed)
PHARMACY - PHYSICIAN COMMUNICATION CRITICAL VALUE ALERT - BLOOD CULTURE IDENTIFICATION (BCID)  Seth Gilbert is an 83 y.o. male who presented to [facility] on 04/29/2019 with a chief complaint of sepsis  Assessment:  1/4 BC positive for E coli  Name of physician (or Provider) Contacted: Antonieta Pert  Current antibiotics: vanc and cefepime  Changes to prescribed antibiotics recommended:  Rec change to ancef 2gm IV q8 hours  Results for orders placed or performed during the hospital encounter of 04/29/19  Blood Culture ID Panel (Reflexed) (Collected: 04/29/2019  1:41 PM)  Result Value Ref Range   Enterococcus species NOT DETECTED NOT DETECTED   Listeria monocytogenes NOT DETECTED NOT DETECTED   Staphylococcus species NOT DETECTED NOT DETECTED   Staphylococcus aureus (BCID) NOT DETECTED NOT DETECTED   Streptococcus species NOT DETECTED NOT DETECTED   Streptococcus agalactiae NOT DETECTED NOT DETECTED   Streptococcus pneumoniae NOT DETECTED NOT DETECTED   Streptococcus pyogenes NOT DETECTED NOT DETECTED   Acinetobacter baumannii NOT DETECTED NOT DETECTED   Enterobacteriaceae species DETECTED (A) NOT DETECTED   Enterobacter cloacae complex NOT DETECTED NOT DETECTED   Escherichia coli DETECTED (A) NOT DETECTED   Klebsiella oxytoca NOT DETECTED NOT DETECTED   Klebsiella pneumoniae NOT DETECTED NOT DETECTED   Proteus species NOT DETECTED NOT DETECTED   Serratia marcescens NOT DETECTED NOT DETECTED   Carbapenem resistance NOT DETECTED NOT DETECTED   Haemophilus influenzae NOT DETECTED NOT DETECTED   Neisseria meningitidis NOT DETECTED NOT DETECTED   Pseudomonas aeruginosa NOT DETECTED NOT DETECTED   Candida albicans NOT DETECTED NOT DETECTED   Candida glabrata NOT DETECTED NOT DETECTED   Candida krusei NOT DETECTED NOT DETECTED   Candida parapsilosis NOT DETECTED NOT DETECTED   Candida tropicalis NOT DETECTED NOT DETECTED    Beverlee Nims 04/30/2019  7:47 AM

## 2019-04-30 NOTE — Significant Event (Signed)
Rapid Response Event Note  Overview: Follow - Up - Code Sepsis   I came by to see Mr. Fandrich this morning, he was alert and oriented, sitting up in bed, getting ready to eat his breakfast. Aside from not getting much rest last night, he had no acute complaints. Skin warm and dry, VS - 97.8 (O), BP 92/58 (69), HR 76 SR, 97% on RA, RR 16-20 - no acute distress. He denied SOB/cough. PMHX includes Large B-Cell Lymphoma. Per patient, his normal BP is around 120/80.  Labs: LA - 4.7>3.4>1.4>3.2>1.3 PCT -  32.27>35.72 WBC - 9.3 > 15.7   Patient has received a total of 3L NS Boluses, is on NS at 100 cc/hr MIVF. Overnight - 350 cc UOP.   Of note - patient does have LT chest Port-A-Cath, no other invasive lines prior to arrival to the hospital.   Plan: - Monitor VS as ordered and as needed - Monitor for signs of shock - AMS, decreased UOP, skin color/temperature changes, etc.  - OOB to chair if patient tolerates  - Encourage Is.   Khayri Kargbo R

## 2019-05-01 DIAGNOSIS — K5732 Diverticulitis of large intestine without perforation or abscess without bleeding: Secondary | ICD-10-CM

## 2019-05-01 LAB — BASIC METABOLIC PANEL
Anion gap: 4 — ABNORMAL LOW (ref 5–15)
BUN: 14 mg/dL (ref 8–23)
CO2: 18 mmol/L — ABNORMAL LOW (ref 22–32)
Calcium: 7.7 mg/dL — ABNORMAL LOW (ref 8.9–10.3)
Chloride: 114 mmol/L — ABNORMAL HIGH (ref 98–111)
Creatinine, Ser: 1.01 mg/dL (ref 0.61–1.24)
GFR calc Af Amer: >60 mL/min (ref >60)
GFR calc non Af Amer: >60 mL/min (ref >60)
Glucose, Bld: 105 mg/dL — ABNORMAL HIGH (ref 70–99)
Potassium: 3.5 mmol/L (ref 3.5–5.1)
Sodium: 136 mmol/L (ref 135–145)

## 2019-05-01 LAB — CBC
HCT: 35.6 % — ABNORMAL LOW (ref 39.0–52.0)
Hemoglobin: 11.9 g/dL — ABNORMAL LOW (ref 13.0–17.0)
MCH: 33.3 pg (ref 26.0–34.0)
MCHC: 33.4 g/dL (ref 30.0–36.0)
MCV: 99.7 fL (ref 80.0–100.0)
Platelets: 109 10*3/uL — ABNORMAL LOW (ref 150–400)
RBC: 3.57 MIL/uL — ABNORMAL LOW (ref 4.22–5.81)
RDW: 14.1 % (ref 11.5–15.5)
WBC: 8.8 10*3/uL (ref 4.0–10.5)
nRBC: 0 % (ref 0.0–0.2)

## 2019-05-01 LAB — URINE CULTURE: Culture: NO GROWTH

## 2019-05-01 LAB — PROCALCITONIN: Procalcitonin: 19.72 ng/mL

## 2019-05-01 MED ORDER — METRONIDAZOLE 500 MG PO TABS
500.0000 mg | ORAL_TABLET | Freq: Three times a day (TID) | ORAL | Status: DC
  Administered 2019-05-01 – 2019-05-02 (×5): 500 mg via ORAL
  Filled 2019-05-01 (×5): qty 1

## 2019-05-01 MED ORDER — METRONIDAZOLE 500 MG PO TABS: 500.0000 mg | ORAL_TABLET | Freq: Three times a day (TID) | ORAL | Status: DC

## 2019-05-01 NOTE — Progress Notes (Signed)
PROGRESS NOTE    Seth Gilbert  [id number] DOB: Apr 05, 1936 DOA: 04/29/2019 PCP: Lawerance Cruel, MD   Brief Narrative: As per HPI 83 y.o. male with medical history significant of NHL presenting with weakness, chills/rigors. He woke around 0300 with chills/rigors but no fever. He went back to bed but continued to experience this.  He finally got out of bed about 0830 due to low back pain which he attributed to thrashing about due to rigors.  He went to the bathroom but was too weak to stand and so they called 911.  No fever.  No urinary symptoms.  No cough/SOB. He had vagal episode on the commode at home.  He has had a rash on his R face associated with scratching his rosacea; he was given Metrogel and Amoxil and has been taking them since Friday.  He also has a decayed R lower molar with an implant replaced last Monday, but he denies any tooth or jaw pain currently.   In the ER   Appeared to have septic shock - SBP in 80s with EMS, given IVF and Epi drip.  Taken off Epi drip and given sepsis bolus IVF.  No respiratory symptoms, no COVID symptoms, UA abnormal, patient was admitted, CT abdomen pelvis don. PCCM (Dr. Halford Chessman) suggested hospitalist admission Patient is currently hemodynamically stable, tolerating p.o., blood culture grew E. Coli  Subjective: He is hard of hearing No compaints slept well, overnight leukocytosis resolved 15.7-->8K  Assessment & Plan:   Septic shock due to Escherichia coli (E. Coli)  POA: 1 out of 2 bottles growing E. Coli: ?source- could be from mild acute sigmoid diverticulitis vs UTI. urine culture with multiple species. Denies any abdominal pain. Off Vanco/cefepime- cont rocephin iv and po flagyl. Lactic acid repeat 9/3 resolved to 1.3.  Blood pressure now has remained stable, will cut down IV fluids, echocardiogram is unremarkable.   Mild acute sigmoid diverticulitis seen in CT- patient denies any abdominal pain-tolerating soft/low fiber diet.  Continue  Rocephin/Flagyl as above.  Reports he had colonoscopy last year by Dr Paulita Fujita- no acute findings per him: He is advised to follow-up with GI in 6 weeks  Lactic acidosis: From sepsis as above, resolved.  AKI with creatinine 1.43-resolved to 1.0.  Cut ivf  Rosacea continue home ointment, for intermittent  flareups he takes amoxicillin/flagyl cream.  HLD continue statin  Hypothyroidism continue levothyroxine, TSH stable  Peripheral neuropathy: No issues.  NHL chemo completed in 07/2006- has had several PET scnas and CT sicne then and in remission as per patient  DVT prophylaxis:lovenox Code Status: Full code Family Communication: plan of care discussed with patient in detail. Disposition Plan: Remains inpatient pending clinical improvement.  Attending PT OT, anticipate disposition home in next 24 to 48 hours if remains clinically stable.  Okay to transfer to [facility]  Consultants:  PCCM from ER  Procedures:  CT abdomen/chest:Left colonic diverticulosis. Mild acute sigmoid diverticulitis.  TTE:   1. The left ventricle has normal systolic function with an ejection fraction of 60-65%. The cavity size was normal. Left ventricular diastolic Doppler parameters are consistent with pseudonormalization. Elevated mean left atrial pressure.  2. The right ventricle has normal systolic function. The cavity was normal.  3. The mitral valve is grossly normal.  4. The tricuspid valve is grossly normal.  5. The aortic valve is tricuspid. Mild calcification of the aortic valve. No stenosis of the aortic valve.  6. The aorta is normal unless otherwise noted.  7. Normal  LV systolic function; grade 2 diastolic dysfunction.  None  Microbiology:  Blood cx- e coli, urine cx mixed  Antimicrobials: Anti-infectives (From admission, onward)   Start     Dose/Rate Route Frequency Ordered Stop   05/01/19 0900  metroNIDAZOLE (FLAGYL) tablet 500 mg     500 mg Oral Every 8 hours 05/01/19 0727 05/06/19 0559     05/01/19 0730  metroNIDAZOLE (FLAGYL) tablet 500 mg  Status:  Discontinued     500 mg Oral Every 8 hours 05/01/19 0725 05/01/19 0727   05/01/19 0330  cefTRIAXone (ROCEPHIN) 2 g in sodium chloride 0.9 % 100 mL IVPB     2 g 200 mL/hr over 30 Minutes Intravenous Every 24 hours 04/30/19 1540     04/30/19 1800  metroNIDAZOLE (FLAGYL) IVPB 500 mg  Status:  Discontinued     500 mg 100 mL/hr over 60 Minutes Intravenous Every 8 hours 04/30/19 1647 05/01/19 0725   04/30/19 1500  vancomycin (VANCOCIN) 1,250 mg in sodium chloride 0.9 % 250 mL IVPB  Status:  Discontinued     1,250 mg 166.7 mL/hr over 90 Minutes Intravenous Every 24 hours 04/29/19 1507 04/30/19 1116   04/30/19 0300  ceFEPIme (MAXIPIME) 2 g in sodium chloride 0.9 % 100 mL IVPB  Status:  Discontinued     2 g 200 mL/hr over 30 Minutes Intravenous Every 12 hours 04/29/19 1507 04/30/19 1538   04/29/19 1445  ceFEPIme (MAXIPIME) 2 g in sodium chloride 0.9 % 100 mL IVPB     2 g 200 mL/hr over 30 Minutes Intravenous  Once 04/29/19 1431 04/29/19 1809   04/29/19 1445  vancomycin (VANCOCIN) 1,750 mg in sodium chloride 0.9 % 500 mL IVPB     1,750 mg 250 mL/hr over 120 Minutes Intravenous  Once 04/29/19 1434 04/29/19 1809   04/29/19 1430  vancomycin (VANCOCIN) IVPB 1000 mg/200 mL premix  Status:  Discontinued     1,000 mg 200 mL/hr over 60 Minutes Intravenous  Once 04/29/19 1429 04/29/19 1434   04/29/19 1430  ceFEPIme (MAXIPIME) 500 mg in dextrose 5 % 50 mL IVPB  Status:  Discontinued     500 mg 100 mL/hr over 30 Minutes Intravenous  Once 04/29/19 1429 04/29/19 1431       Objective: Vitals:   04/30/19 2324 05/01/19 0300 05/01/19 0332 05/01/19 0735  BP: 115/64  119/64 117/69  Pulse: 84 82 88 74  Resp: 16 15 10 19   Temp: 99.3 F (37.4 C)  99.8 F (37.7 C) 98.6 F (37 C)  TempSrc: Oral  Oral Oral  SpO2: 100% 95% 97% 100%  Weight:      Height:        Intake/Output Summary (Last 24 hours) at 05/01/2019 0954 Last data filed at 05/01/2019  0852 Gross per 24 hour  Intake 3048.73 ml  Output 975 ml  Net 2073.73 ml   Filed Weights   04/30/19 1155  Weight: 91.3 kg   Weight change:   Body mass index is 30.6 kg/m.  Intake/Output from previous day: 09/03 0701 - 09/04 0700 In: 3170.5 [P.O.:280; I.V.:2439.8; IV Piggyback:450.7] Out: 825 [Urine:825] Intake/Output this shift: Total I/O In: 340 [P.O.:340] Out: 150 [Urine:150]  Examination:  General exam: AAO, not in acute distress, hard of hearing. HEENT:Oral mucosa moist, Ear/Nose WNL grossly, dentition normal. Respiratory system: Diminished at the base,no wheezing or crackles, NT,no use of accessory muscle Cardiovascular system: S1 & S2 +, No JVD, regular RR. Gastrointestinal system: Abdomen soft nontender nondistended bowel sounds present  Nervous System:Alert, awake, moving extremities and grossly nonfocal Extremities: No edema, distal peripheral pulses palpable.  Skin: No rashes,no icterus. MSK: Normal muscle bulk,tone, power Medications:  Scheduled Meds:  docusate sodium  100 mg Oral BID   enoxaparin (LOVENOX) injection  40 mg Subcutaneous Q24H   levothyroxine  50 mcg Oral QAC breakfast   metroNIDAZOLE  500 mg Oral Q8H   metroNIDAZOLE  1 application Topical Daily   rosuvastatin  20 mg Oral Daily   sodium chloride flush  3 mL Intravenous Q12H   Continuous Infusions:  sodium chloride 125 mL/hr at 05/01/19 0525   cefTRIAXone (ROCEPHIN)  IV 2 g (05/01/19 0337)    Data Reviewed: I have personally reviewed following labs and imaging studies  CBC: Recent Labs  Lab 04/29/19 1341 04/30/19 0244 05/01/19 0236  WBC 9.3 15.7* 8.8  NEUTROABS 8.7*  --   --   HGB 13.7 12.3* 11.9*  HCT 42.3 37.0* 35.6*  MCV 102.4* 101.1* 99.7  PLT 140* 123* 0000000*   Basic Metabolic Panel: Recent Labs  Lab 04/29/19 1341 04/30/19 0244 05/01/19 0236  NA 140 141 136  K 3.6 4.2 3.5  CL 108 114* 114*  CO2 19* 19* 18*  GLUCOSE 120* 90 105*  BUN 21 20 14   CREATININE  1.43* 1.16 1.01  CALCIUM 8.5* 7.9* 7.7*   GFR: Estimated Creatinine Clearance: 64 mL/min (by C-G formula based on SCr of 1.01 mg/dL). Liver Function Tests: Recent Labs  Lab 04/29/19 1341  AST 28  ALT 17  ALKPHOS 62  BILITOT 1.3*  PROT 5.5*  ALBUMIN 3.4*   No results for input(s): LIPASE, AMYLASE in the last 168 hours. No results for input(s): AMMONIA in the last 168 hours. Coagulation Profile: Recent Labs  Lab 04/29/19 1534  INR 1.5*   Cardiac Enzymes: No results for input(s): CKTOTAL, CKMB, CKMBINDEX, TROPONINI in the last 168 hours. BNP (last 3 results) No results for input(s): PROBNP in the last 8760 hours. HbA1C: No results for input(s): HGBA1C in the last 72 hours. CBG: No results for input(s): GLUCAP in the last 168 hours. Lipid Profile: No results for input(s): CHOL, HDL, LDLCALC, TRIG, CHOLHDL, LDLDIRECT in the last 72 hours. Thyroid Function Tests: Recent Labs    04/30/19 0849  TSH 2.952   Anemia Panel: No results for input(s): VITAMINB12, FOLATE, FERRITIN, TIBC, IRON, RETICCTPCT in the last 72 hours. Sepsis Labs: Recent Labs  Lab 04/29/19 1525 04/29/19 1900 04/29/19 2324 04/30/19 0244 04/30/19 0737 05/01/19 0236  PROCALCITON  --  32.27  --  35.72  --  19.72  LATICACIDVEN 3.4* 1.4 3.2*  --  1.3  --     Recent Results (from the past 240 hour(s))  Culture, blood (routine x 2)     Status: Abnormal (Preliminary result)   Collection Time: 04/29/19  1:41 PM   Specimen: BLOOD  Result Value Ref Range Status   Specimen Description BLOOD LEFT ANTECUBITAL  Final   Special Requests   Final    BOTTLES DRAWN AEROBIC AND ANAEROBIC Blood Culture adequate volume   Culture  Setup Time   Final    GRAM NEGATIVE RODS ANAEROBIC BOTTLE ONLY CRITICAL RESULT CALLED TO, READ BACK BY AND VERIFIED WITH: Cottie Banda [id number] Performed at [facility], [place] [address], [place], [place] [address]    Culture ESCHERICHIA COLI (A)  Final   Report Status  PENDING  Incomplete  Blood Culture ID Panel (Reflexed)     Status: Abnormal  Collection Time: 04/29/19  1:41 PM  Result Value Ref Range Status   Enterococcus species NOT DETECTED NOT DETECTED Final   Listeria monocytogenes NOT DETECTED NOT DETECTED Final   Staphylococcus species NOT DETECTED NOT DETECTED Final   Staphylococcus aureus (BCID) NOT DETECTED NOT DETECTED Final   Streptococcus species NOT DETECTED NOT DETECTED Final   Streptococcus agalactiae NOT DETECTED NOT DETECTED Final   Streptococcus pneumoniae NOT DETECTED NOT DETECTED Final   Streptococcus pyogenes NOT DETECTED NOT DETECTED Final   Acinetobacter baumannii NOT DETECTED NOT DETECTED Final   Enterobacteriaceae species DETECTED (A) NOT DETECTED Final    Comment: Enterobacteriaceae represent a large family of gram-negative bacteria, not a single organism. CRITICAL RESULT CALLED TO, READ BACK BY AND VERIFIED WITH: PHARMD L SEAY [id number] MLM    Enterobacter cloacae complex NOT DETECTED NOT DETECTED Final   Escherichia coli DETECTED (A) NOT DETECTED Final    Comment: CRITICAL RESULT CALLED TO, READ BACK BY AND VERIFIED WITH: PHARMD L SEAY [id number] MLM    Klebsiella oxytoca NOT DETECTED NOT DETECTED Final   Klebsiella pneumoniae NOT DETECTED NOT DETECTED Final   Proteus species NOT DETECTED NOT DETECTED Final   Serratia marcescens NOT DETECTED NOT DETECTED Final   Carbapenem resistance NOT DETECTED NOT DETECTED Final   Haemophilus influenzae NOT DETECTED NOT DETECTED Final   Neisseria meningitidis NOT DETECTED NOT DETECTED Final   Pseudomonas aeruginosa NOT DETECTED NOT DETECTED Final   Candida albicans NOT DETECTED NOT DETECTED Final   Candida glabrata NOT DETECTED NOT DETECTED Final   Candida krusei NOT DETECTED NOT DETECTED Final   Candida parapsilosis NOT DETECTED NOT DETECTED Final   Candida tropicalis NOT DETECTED NOT DETECTED Final    Comment: Performed at [facility], [place] [address]  [place], [place] [address]  Culture, blood (routine x 2)     Status: None (Preliminary result)   Collection Time: 04/29/19  2:45 PM   Specimen: BLOOD  Result Value Ref Range Status   Specimen Description BLOOD RIGHT ANTECUBITAL  Final   Special Requests   Final    BOTTLES DRAWN AEROBIC AND ANAEROBIC Blood Culture results may not be optimal due to an inadequate volume of blood received in culture bottles   Culture   Final    NO GROWTH < 24 HOURS Performed at [facility], [place] [address], [place], [place] [address]    Report Status PENDING  Incomplete  SARS Coronavirus 2 [facility] order, Performed in [facility] hospital lab) Nasopharyngeal Nasopharyngeal Swab     Status: None   Collection Time: 04/29/19  3:06 PM   Specimen: Nasopharyngeal Swab  Result Value Ref Range Status   SARS Coronavirus 2 NEGATIVE NEGATIVE Final    Comment: (NOTE) If result is NEGATIVE SARS-CoV-2 target nucleic acids are NOT DETECTED. The SARS-CoV-2 RNA is generally detectable in upper and lower  respiratory specimens during the acute phase of infection. The lowest  concentration of SARS-CoV-2 viral copies this assay can detect is 250  copies / mL. A negative result does not preclude SARS-CoV-2 infection  and should not be used as the sole basis for treatment or other  patient management decisions.  A negative result may occur with  improper specimen collection / handling, submission of specimen other  than nasopharyngeal swab, presence of viral mutation(s) within the  areas targeted by this assay, and inadequate number of viral copies  (<250 copies / mL). A negative result must be combined with clinical  observations, patient history, and epidemiological information. If result is POSITIVE SARS-CoV-2 target nucleic acids are DETECTED. The SARS-CoV-2 RNA is generally detectable in upper and lower  respiratory specimens dur ing the acute phase of infection.  Positive  results are indicative of active infection  with SARS-CoV-2.  Clinical  correlation with patient history and other diagnostic information is  necessary to determine patient infection status.  Positive results do  not rule out bacterial infection or co-infection with other viruses. If result is PRESUMPTIVE POSTIVE SARS-CoV-2 nucleic acids MAY BE PRESENT.   A presumptive positive result was obtained on the submitted specimen  and confirmed on repeat testing.  While 2019 novel coronavirus  (SARS-CoV-2) nucleic acids may be present in the submitted sample  additional confirmatory testing may be necessary for epidemiological  and / or clinical management purposes  to differentiate between  SARS-CoV-2 and other Sarbecovirus currently known to infect humans.  If clinically indicated additional testing with an alternate test  methodology [phone]) is advised. The SARS-CoV-2 RNA is generally  detectable in upper and lower respiratory sp ecimens during the acute  phase of infection. The expected result is Negative. Fact Sheet for Patients:  StrictlyIdeas.no Fact Sheet for Healthcare Providers: BankingDealers.co.za This test is not yet approved or cleared by the [place] FDA and has been authorized for detection and/or diagnosis of SARS-CoV-2 by FDA under an Emergency Use Authorization (EUA).  This EUA will remain in effect (meaning this test can be used) for the duration of the COVID-19 declaration under Section 564(b)(1) of the Act, 21 U.S.C. section 360bbb-3(b)(1), unless the authorization is terminated or revoked sooner. Performed at [facility], [place] [address], [place], [place] [address]   Urine culture     Status: Abnormal   Collection Time: 04/29/19  4:13 PM   Specimen: In/Out Cath Urine  Result Value Ref Range Status   Specimen Description IN/OUT CATH URINE  Final   Special Requests   Final    NONE Performed at [facility], [place] [address] [place], [place]  [address]    Culture MULTIPLE SPECIES PRESENT, SUGGEST RECOLLECTION (A)  Final   Report Status 04/30/2019 FINAL  Final  MRSA PCR Screening     Status: None   Collection Time: 04/29/19  9:15 PM   Specimen: Nasal Mucosa; Nasopharyngeal  Result Value Ref Range Status   MRSA by PCR NEGATIVE NEGATIVE Final    Comment:        The GeneXpert MRSA Assay (FDA approved for NASAL specimens only), is one component of a comprehensive MRSA colonization surveillance program. It is not intended to diagnose MRSA infection nor to guide or monitor treatment for MRSA infections. Performed at [facility], [place] [address] [place], [place] [address]       Radiology Studies: Dg Chest 2 View  Result Date: 04/29/2019 CLINICAL DATA:  55-year-old male with hypotension EXAM: CHEST - 2 VIEW COMPARISON:  Chest radiograph dated 06/16/2016 FINDINGS: Shallow inspiration with bibasilar atelectasis. No focal consolidation, pleural effusion, or pneumothorax. Stable cardiac silhouette. Left pectoral Port-A-Cath in similar position. No acute osseous pathology. IMPRESSION: No active cardiopulmonary disease. Electronically Signed   By: Anner Crete M.D.   On: 04/29/2019 14:33   Ct Chest W Contrast  Result Date: 04/29/2019 CLINICAL DATA:  Hypotension. Weakness. EXAM: CT CHEST, ABDOMEN, AND PELVIS WITH CONTRAST TECHNIQUE: Multidetector CT imaging of the chest, abdomen and pelvis was performed following the standard protocol during bolus administration of intravenous contrast. CONTRAST:  119mL OMNIPAQUE IOHEXOL 300 MG/ML  SOLN COMPARISON:  June 22, 2016 FINDINGS: CT CHEST FINDINGS Cardiovascular: Normal heart size. Minimal pericardial effusion. Calcific atherosclerotic disease of the coronary arteries. Tortuosity and mild calcific atherosclerotic disease of the aorta. Mediastinum/Nodes: Small hiatal hernia. No evidence of lymphadenopathy. Lungs/Pleura: Lungs are clear. No pleural effusion or pneumothorax. Mild lingular  scarring. Musculoskeletal: No chest wall mass or suspicious bone lesions identified. CT ABDOMEN PELVIS FINDINGS Hepatobiliary: No focal liver abnormality is seen. No gallstones, gallbladder wall thickening, or biliary dilatation. Pancreas: Unremarkable. No pancreatic ductal dilatation or surrounding inflammatory changes. Spleen: Normal in size without focal abnormality. Adrenals/Urinary Tract: Adrenal glands are unremarkable. Kidneys are normal, without renal calculi, focal lesion, or hydronephrosis. Bladder is unremarkable. Stomach/Bowel: Stomach is within normal limits. No evidence of appendicitis. No evidence of small bowel wall thickening, distention, or inflammatory changes. Left colonic diverticulosis. Long segment of mucosal thickening of the sigmoid colon with minimal pericolonic inflammatory stranding may represent an element of acute mild diverticulitis. Vascular/Lymphatic: Aortic atherosclerosis. No enlarged abdominal or pelvic lymph nodes. Reproductive: Coarse calcifications of the prostate gland. Other: No abdominal wall hernia or abnormality. No abdominopelvic ascites. Musculoskeletal: No acute osseous findings. Spondylosis of the spine. IMPRESSION: 1. Left colonic diverticulosis. Mild acute sigmoid diverticulitis. 2. Small hiatal hernia. 3. Calcific atherosclerotic disease of the coronary arteries and aorta. 4. Minimal pericardial effusion. 5. Otherwise no evidence of acute abnormalities within the chest, abdomen or pelvis. Electronically Signed   By: Fidela Salisbury M.D.   On: 04/29/2019 20:48   Ct Abdomen Pelvis W Contrast  Result Date: 04/29/2019 CLINICAL DATA:  Hypotension. Weakness. EXAM: CT CHEST, ABDOMEN, AND PELVIS WITH CONTRAST TECHNIQUE: Multidetector CT imaging of the chest, abdomen and pelvis was performed following the standard protocol during bolus administration of intravenous contrast. CONTRAST:  185mL OMNIPAQUE IOHEXOL 300 MG/ML  SOLN COMPARISON:  June 22, 2016 FINDINGS: CT  CHEST FINDINGS Cardiovascular: Normal heart size. Minimal pericardial effusion. Calcific atherosclerotic disease of the coronary arteries. Tortuosity and mild calcific atherosclerotic disease of the aorta. Mediastinum/Nodes: Small hiatal hernia. No evidence of lymphadenopathy. Lungs/Pleura: Lungs are clear. No pleural effusion or pneumothorax. Mild lingular scarring. Musculoskeletal: No chest wall mass or suspicious bone lesions identified. CT ABDOMEN PELVIS FINDINGS Hepatobiliary: No focal liver abnormality is seen. No gallstones, gallbladder wall thickening, or biliary dilatation. Pancreas: Unremarkable. No pancreatic ductal dilatation or surrounding inflammatory changes. Spleen: Normal in size without focal abnormality. Adrenals/Urinary Tract: Adrenal glands are unremarkable. Kidneys are normal, without renal calculi, focal lesion, or hydronephrosis. Bladder is unremarkable. Stomach/Bowel: Stomach is within normal limits. No evidence of appendicitis. No evidence of small bowel wall thickening, distention, or inflammatory changes. Left colonic diverticulosis. Long segment of mucosal thickening of the sigmoid colon with minimal pericolonic inflammatory stranding may represent an element of acute mild diverticulitis. Vascular/Lymphatic: Aortic atherosclerosis. No enlarged abdominal or pelvic lymph nodes. Reproductive: Coarse calcifications of the prostate gland. Other: No abdominal wall hernia or abnormality. No abdominopelvic ascites. Musculoskeletal: No acute osseous findings. Spondylosis of the spine. IMPRESSION: 1. Left colonic diverticulosis. Mild acute sigmoid diverticulitis. 2. Small hiatal hernia. 3. Calcific atherosclerotic disease of the coronary arteries and aorta. 4. Minimal pericardial effusion. 5. Otherwise no evidence of acute abnormalities within the chest, abdomen or pelvis. Electronically Signed   By: Fidela Salisbury M.D.   On: 04/29/2019 20:48      LOS: 2 days   Time spent: More than 50%  of that time was spent in counseling and/or coordination of care.  Antonieta Pert,  MD [facility]  05/01/2019, 9:54 AM

## 2019-05-01 NOTE — Plan of Care (Signed)
  Problem: Education: Goal: Knowledge of General Education information will improve Description: Including pain rating scale, medication(s)/side effects and non-pharmacologic comfort measures 05/01/2019 1537 by Eulis Manly, RN Outcome: Progressing 05/01/2019 1316 by Eulis Manly, RN Outcome: Progressing   Problem: Health Behavior/Discharge Planning: Goal: Ability to manage health-related needs will improve 05/01/2019 1537 by Eulis Manly, RN Outcome: Progressing 05/01/2019 1316 by Eulis Manly, RN Outcome: Progressing   Problem: Clinical Measurements: Goal: Ability to maintain clinical measurements within normal limits will improve 05/01/2019 1537 by Eulis Manly, RN Outcome: Progressing 05/01/2019 1316 by Eulis Manly, RN Outcome: Progressing Goal: Will remain free from infection 05/01/2019 1537 by Eulis Manly, RN Outcome: Progressing 05/01/2019 1316 by Eulis Manly, RN Outcome: Progressing Goal: Diagnostic test results will improve 05/01/2019 1537 by Eulis Manly, RN Outcome: Progressing 05/01/2019 1316 by Silvestre Mesi D, RN Outcome: Progressing Goal: Respiratory complications will improve 05/01/2019 1537 by Eulis Manly, RN Outcome: Progressing 05/01/2019 1316 by Silvestre Mesi D, RN Outcome: Progressing Goal: Cardiovascular complication will be avoided 05/01/2019 1537 by Eulis Manly, RN Outcome: Progressing 05/01/2019 1316 by Eulis Manly, RN Outcome: Progressing

## 2019-05-01 NOTE — Plan of Care (Signed)

## 2019-05-01 NOTE — Plan of Care (Signed)

## 2019-05-01 NOTE — Evaluation (Signed)
Physical Therapy Evaluation Patient Details Name: Seth Gilbert MRN: [id number] DOB: 09/28/1935 Today's Date: 05/01/2019   History of Present Illness  As per HPI 83 y.o. male with medical history significant of NHL presenting with weakness, chills/rigors.  Admitted due to septic shock due to e-coli bacteremia possibly due to diverticulitis vs. UTI.  Clinical Impression  Patient presents with decreased mobility due to deficits listed in PT problem list.  Currently S for ambulation with wide BOS and mild veering in hallway.  Has participated in outpatient PT in the past and feel should progress with HEP from prior therapy and use of equipment at home.  Encouraged to contact PCP if feels he needs to return to therapy following this episode.  Will follow up during acute stay, no initial follow up needed at this time.     Follow Up Recommendations No PT follow up    Equipment Recommendations  None recommended by PT    Recommendations for Other Services       Precautions / Restrictions Precautions Precautions: Fall      Mobility  Bed Mobility Overal bed mobility: Modified Independent                Transfers Overall transfer level: Modified independent Equipment used: None                Ambulation/Gait Ambulation/Gait assistance: Supervision Gait Distance (Feet): 250 Feet   Gait Pattern/deviations: Wide base of support;Drifts right/left;Step-through pattern;Decreased stride length     General Gait Details: mild drifting in hallway, able to step over tile as if curb and some slowing with head lifted to look up  Stairs            Wheelchair Mobility    Modified Rankin (Stroke Patients Only)       Balance Overall balance assessment: Needs assistance   Sitting balance-Leahy Scale: Good       Standing balance-Leahy Scale: Good                   Standardized Balance Assessment Standardized Balance Assessment : Dynamic Gait Index   Dynamic  Gait Index Level Surface: Mild Impairment Change in Gait Speed: Mild Impairment Gait with Horizontal Head Turns: Mild Impairment Gait with Vertical Head Turns: Mild Impairment Gait and Pivot Turn: Normal Step Over Obstacle: Mild Impairment       Pertinent Vitals/Pain Pain Assessment: No/denies pain    Home Living Family/patient expects to be discharged to:: Private residence Living Arrangements: Spouse/significant other   Type of Home: House Home Access: Level entry(from garage)     Home Layout: Two level Home Equipment: Shower seat - built in;Grab bars - tub/shower      Prior Function Level of Independence: Independent               Hand Dominance        Extremity/Trunk Assessment        Lower Extremity Assessment Lower Extremity Assessment: RLE deficits/detail;LLE deficits/detail RLE Deficits / Details: generally WFL RLE Sensation: history of peripheral neuropathy LLE Deficits / Details: generally WFL LLE Sensation: history of peripheral neuropathy       Communication   Communication: HOH  Cognition Arousal/Alertness: Awake/alert Behavior During Therapy: WFL for tasks assessed/performed Overall Cognitive Status: Within Functional Limits for tasks assessed  General Comments General comments (skin integrity, edema, etc.): Reports previous oiutpatient neuro PT/OT x 8 weeks then to [facility] for maximizing mobility following chemo/neuropathy, then using treadmill at home since COVID.    Exercises     Assessment/Plan    PT Assessment Patient needs continued PT services  PT Problem List Decreased balance;Decreased mobility;Decreased knowledge of use of DME       PT Treatment Interventions DME instruction;Stair training;Gait training;Functional mobility training;Therapeutic exercise;Patient/family education;Therapeutic activities    PT Goals (Current goals can be found in the Care Plan section)   Acute Rehab PT Goals Patient Stated Goal: To go home PT Goal Formulation: With patient/family Time For Goal Achievement: 05/15/19 Potential to Achieve Goals: Good    Frequency Min 3X/week   Barriers to discharge        Co-evaluation               AM-PAC PT "6 Clicks" Mobility  Outcome Measure Help needed turning from your back to your side while in a flat bed without using bedrails?: None Help needed moving from lying on your back to sitting on the side of a flat bed without using bedrails?: None Help needed moving to and from a bed to a chair (including a wheelchair)?: None Help needed standing up from a chair using your arms (e.g., wheelchair or bedside chair)?: None Help needed to walk in hospital room?: None Help needed climbing 3-5 steps with a railing? : None 6 Click Score: 24    End of Session   Activity Tolerance: Patient tolerated treatment well Patient left: in bed;with call bell/phone within reach;with family/visitor present   PT Visit Diagnosis: Other abnormalities of gait and mobility (R26.89)    Time: [id number] PT Time Calculation (min) (ACUTE ONLY): 23 min   Charges:   PT Evaluation $PT Eval Low Complexity: 1 Low PT Treatments $Gait Training: 8-22 mins        Magda Kiel, [place] [phone] 05/01/2019   Reginia Naas 05/01/2019, 1:30 PM

## 2019-05-02 LAB — BASIC METABOLIC PANEL
Anion gap: 9 (ref 5–15)
BUN: 11 mg/dL (ref 8–23)
CO2: 19 mmol/L — ABNORMAL LOW (ref 22–32)
Calcium: 7.8 mg/dL — ABNORMAL LOW (ref 8.9–10.3)
Chloride: 110 mmol/L (ref 98–111)
Creatinine, Ser: 1.02 mg/dL (ref 0.61–1.24)
GFR calc Af Amer: >60 mL/min (ref >60)
GFR calc non Af Amer: >60 mL/min (ref >60)
Glucose, Bld: 96 mg/dL (ref 70–99)
Potassium: 3.7 mmol/L (ref 3.5–5.1)
Sodium: 138 mmol/L (ref 135–145)

## 2019-05-02 LAB — CBC
HCT: 36.4 % — ABNORMAL LOW (ref 39.0–52.0)
Hemoglobin: 12.3 g/dL — ABNORMAL LOW (ref 13.0–17.0)
MCH: 33.4 pg (ref 26.0–34.0)
MCHC: 33.8 g/dL (ref 30.0–36.0)
MCV: 98.9 fL (ref 80.0–100.0)
Platelets: 113 10*3/uL — ABNORMAL LOW (ref 150–400)
RBC: 3.68 MIL/uL — ABNORMAL LOW (ref 4.22–5.81)
RDW: 13.9 % (ref 11.5–15.5)
WBC: 6.6 10*3/uL (ref 4.0–10.5)
nRBC: 0 % (ref 0.0–0.2)

## 2019-05-02 MED ORDER — CEPHALEXIN 500 MG PO CAPS: 500.0000 mg | ORAL_CAPSULE | Freq: Three times a day (TID) | ORAL | 0 refills | Status: AC

## 2019-05-02 NOTE — Discharge Summary (Signed)
Physician Discharge Summary  Seth Gilbert [id number] DOB: 02/04/1936 DOA: 04/29/2019  PCP: Seth Cruel, MD  Admit date: 04/29/2019 Discharge date: 05/02/2019  Admitted From: Home  Disposition:  Home   Recommendations for Outpatient Follow-up:  1. Follow up with PCP in 1-2 weeks   Home Health:NA  Equipment/Devices:NA   Discharge Condition:stable   CODE STATUS: full code  Diet recommendation: regular diet.  Discharge Summary: 83 year old gentleman with history of NHL presented to the emergency room with weakness, sudden onset of chills and Rigor.  He did have an episode of diarrhea which is not particularly new for him, denied any urinary symptoms before presenting to ER.  In the emergency room, he was able to be in a septic shock with a blood pressure of 80s, treated with IV fluids and short-term epinephrine infusion.  COVID negative.  UA abnormal.  CT abdomen pelvis was normal.  Patient was admitted to hospital and treated as septic shock due to E. coli bacteremia.  1 out of 2 bottles positive for pansensitive E. coli.  Urine cultures with mixed organism.  CT scan showed mild acute diverticulitis.  Rapidly improved and now asymptomatic.  Afebrile.  Echocardiogram unremarkable.  Patient has received 3 days of IV antibiotics initially with vancomycin and cefepime and then continued on Rocephin and Flagyl. Primary source of infection unknown.  Likely UTI or transient translocation of E. coli from colitis.  Clinically improved.  Transitioning to oral cephalosporin for 10 more days.  He was taking amoxicillin intermittently before coming to hospital for his rosacea that he can stop.  He is on Flagyl cream.  Discharge Diagnoses:  Principal Problem:   Sepsis due to Escherichia coli (E. coli) ([name]) Active Problems:   Diffuse large B-cell lymphoma of lymph nodes of multiple regions (HCC)   Lactic acidosis   Peripheral neuropathy   Septic shock due to undetermined organism [facility])    Diverticulitis of sigmoid colon    Discharge Instructions  Discharge Instructions    Call MD for:  temperature >100.4   Complete by: As directed    Diet - low sodium heart healthy   Complete by: As directed    Increase activity slowly   Complete by: As directed      Allergies as of 05/02/2019      Reactions   No Known Allergies       Medication List    STOP taking these medications   ampicillin 500 MG capsule Commonly known as: PRINCIPEN     TAKE these medications   CENTRUM SILVER PO Take 1 tablet by mouth daily.   cephALEXin 500 MG capsule Commonly known as: KEFLEX Take 1 capsule (500 mg total) by mouth 3 (three) times daily for 10 days.   famotidine 40 MG tablet Commonly known as: PEPCID Take 40 mg by mouth daily as needed for heartburn.   GLUCOSAMINE PO Take 1,800 mg by mouth 3 (three) times daily.   levothyroxine 50 MCG tablet Commonly known as: SYNTHROID Take 1 tablet by mouth daily.   lidocaine-prilocaine cream Commonly known as: EMLA Apply 1 application topically as needed.   metroNIDAZOLE 0.75 % gel Commonly known as: METROGEL Apply 1 application topically daily.   rosuvastatin 20 MG tablet Commonly known as: CRESTOR Take 20 mg by mouth daily.   senna 8.6 MG tablet Commonly known as: SENOKOT Take 1 tablet by mouth daily as needed for constipation.   vitamin E 400 UNIT capsule Take 400 Units by mouth daily.  Allergies  Allergen Reactions  . No Known Allergies     Consultations:  PCCM   Procedures/Studies: Dg Chest 2 View  Result Date: 04/29/2019 CLINICAL DATA:  60-year-old male with hypotension EXAM: CHEST - 2 VIEW COMPARISON:  Chest radiograph dated 06/16/2016 FINDINGS: Shallow inspiration with bibasilar atelectasis. No focal consolidation, pleural effusion, or pneumothorax. Stable cardiac silhouette. Left pectoral Port-A-Cath in similar position. No acute osseous pathology. IMPRESSION: No active cardiopulmonary disease.  Electronically Signed   By: Anner Crete M.D.   On: 04/29/2019 14:33   Ct Chest W Contrast  Result Date: 04/29/2019 CLINICAL DATA:  Hypotension. Weakness. EXAM: CT CHEST, ABDOMEN, AND PELVIS WITH CONTRAST TECHNIQUE: Multidetector CT imaging of the chest, abdomen and pelvis was performed following the standard protocol during bolus administration of intravenous contrast. CONTRAST:  169mL OMNIPAQUE IOHEXOL 300 MG/ML  SOLN COMPARISON:  June 22, 2016 FINDINGS: CT CHEST FINDINGS Cardiovascular: Normal heart size. Minimal pericardial effusion. Calcific atherosclerotic disease of the coronary arteries. Tortuosity and mild calcific atherosclerotic disease of the aorta. Mediastinum/Nodes: Small hiatal hernia. No evidence of lymphadenopathy. Lungs/Pleura: Lungs are clear. No pleural effusion or pneumothorax. Mild lingular scarring. Musculoskeletal: No chest wall mass or suspicious bone lesions identified. CT ABDOMEN PELVIS FINDINGS Hepatobiliary: No focal liver abnormality is seen. No gallstones, gallbladder wall thickening, or biliary dilatation. Pancreas: Unremarkable. No pancreatic ductal dilatation or surrounding inflammatory changes. Spleen: Normal in size without focal abnormality. Adrenals/Urinary Tract: Adrenal glands are unremarkable. Kidneys are normal, without renal calculi, focal lesion, or hydronephrosis. Bladder is unremarkable. Stomach/Bowel: Stomach is within normal limits. No evidence of appendicitis. No evidence of small bowel wall thickening, distention, or inflammatory changes. Left colonic diverticulosis. Long segment of mucosal thickening of the sigmoid colon with minimal pericolonic inflammatory stranding may represent an element of acute mild diverticulitis. Vascular/Lymphatic: Aortic atherosclerosis. No enlarged abdominal or pelvic lymph nodes. Reproductive: Coarse calcifications of the prostate gland. Other: No abdominal wall hernia or abnormality. No abdominopelvic ascites.  Musculoskeletal: No acute osseous findings. Spondylosis of the spine. IMPRESSION: 1. Left colonic diverticulosis. Mild acute sigmoid diverticulitis. 2. Small hiatal hernia. 3. Calcific atherosclerotic disease of the coronary arteries and aorta. 4. Minimal pericardial effusion. 5. Otherwise no evidence of acute abnormalities within the chest, abdomen or pelvis. Electronically Signed   By: Fidela Salisbury M.D.   On: 04/29/2019 20:48   Ct Abdomen Pelvis W Contrast  Result Date: 04/29/2019 CLINICAL DATA:  Hypotension. Weakness. EXAM: CT CHEST, ABDOMEN, AND PELVIS WITH CONTRAST TECHNIQUE: Multidetector CT imaging of the chest, abdomen and pelvis was performed following the standard protocol during bolus administration of intravenous contrast. CONTRAST:  162mL OMNIPAQUE IOHEXOL 300 MG/ML  SOLN COMPARISON:  June 22, 2016 FINDINGS: CT CHEST FINDINGS Cardiovascular: Normal heart size. Minimal pericardial effusion. Calcific atherosclerotic disease of the coronary arteries. Tortuosity and mild calcific atherosclerotic disease of the aorta. Mediastinum/Nodes: Small hiatal hernia. No evidence of lymphadenopathy. Lungs/Pleura: Lungs are clear. No pleural effusion or pneumothorax. Mild lingular scarring. Musculoskeletal: No chest wall mass or suspicious bone lesions identified. CT ABDOMEN PELVIS FINDINGS Hepatobiliary: No focal liver abnormality is seen. No gallstones, gallbladder wall thickening, or biliary dilatation. Pancreas: Unremarkable. No pancreatic ductal dilatation or surrounding inflammatory changes. Spleen: Normal in size without focal abnormality. Adrenals/Urinary Tract: Adrenal glands are unremarkable. Kidneys are normal, without renal calculi, focal lesion, or hydronephrosis. Bladder is unremarkable. Stomach/Bowel: Stomach is within normal limits. No evidence of appendicitis. No evidence of small bowel wall thickening, distention, or inflammatory changes. Left colonic diverticulosis. Long segment of  mucosal  thickening of the sigmoid colon with minimal pericolonic inflammatory stranding may represent an element of acute mild diverticulitis. Vascular/Lymphatic: Aortic atherosclerosis. No enlarged abdominal or pelvic lymph nodes. Reproductive: Coarse calcifications of the prostate gland. Other: No abdominal wall hernia or abnormality. No abdominopelvic ascites. Musculoskeletal: No acute osseous findings. Spondylosis of the spine. IMPRESSION: 1. Left colonic diverticulosis. Mild acute sigmoid diverticulitis. 2. Small hiatal hernia. 3. Calcific atherosclerotic disease of the coronary arteries and aorta. 4. Minimal pericardial effusion. 5. Otherwise no evidence of acute abnormalities within the chest, abdomen or pelvis. Electronically Signed   By: Fidela Salisbury M.D.   On: 04/29/2019 20:48      Subjective: Patient seen and examined.  Denies any complaints.  Was eager to go home.  Denies any prostatic symptoms.  Urinating without trouble.   Discharge Exam: Vitals:   05/02/19 0737 05/02/19 1233  BP: 115/69 109/67  Pulse: 69 88  Resp: (!) 23 (!) 26  Temp: 98.8 F (37.1 C) 97.7 F (36.5 C)  SpO2: 98% 97%   Vitals:   05/01/19 2000 05/01/19 2300 05/02/19 0737 05/02/19 1233  BP: 120/68 121/66 115/69 109/67  Pulse: 80 78 69 88  Resp: (!) 26 17 (!) 23 (!) 26  Temp: 99.2 F (37.3 C) 99.3 F (37.4 C) 98.8 F (37.1 C) 97.7 F (36.5 C)  TempSrc: Oral Oral Oral Oral  SpO2: 97% 99% 98% 97%  Weight:      Height:        General: Pt is alert, awake, not in acute distress, acne rosacea both cheek. Cardiovascular: RRR, S1/S2 +, no rubs, no gallops Respiratory: CTA bilaterally, no wheezing, no rhonchi Abdominal: Soft, NT, ND, bowel sounds + Extremities: no edema, no cyanosis    The results of significant diagnostics from this hospitalization (including imaging, microbiology, ancillary and laboratory) are listed below for reference.     Microbiology: Recent Results (from the past 240  hour(s))  Culture, blood (routine x 2)     Status: Abnormal   Collection Time: 04/29/19  1:41 PM   Specimen: BLOOD  Result Value Ref Range Status   Specimen Description BLOOD LEFT ANTECUBITAL  Final   Special Requests   Final    BOTTLES DRAWN AEROBIC AND ANAEROBIC Blood Culture adequate volume   Culture  Setup Time   Final    GRAM NEGATIVE RODS IN BOTH AEROBIC AND ANAEROBIC BOTTLES CRITICAL RESULT CALLED TO, READ BACK BY AND VERIFIED WITH: Cottie Banda [id number] MLM Performed at [facility], [place] [address], [place], [place] [address]    Culture ESCHERICHIA COLI (A)  Final   Report Status 05/02/2019 FINAL  Final   Organism ID, Bacteria ESCHERICHIA COLI  Final      Susceptibility   Escherichia coli - MIC*    AMPICILLIN <=2 SENSITIVE Sensitive     CEFAZOLIN <=4 SENSITIVE Sensitive     CEFEPIME <=1 SENSITIVE Sensitive     CEFTAZIDIME <=1 SENSITIVE Sensitive     CEFTRIAXONE <=1 SENSITIVE Sensitive     CIPROFLOXACIN <=0.25 SENSITIVE Sensitive     GENTAMICIN <=1 SENSITIVE Sensitive     IMIPENEM <=0.25 SENSITIVE Sensitive     TRIMETH/SULFA <=20 SENSITIVE Sensitive     AMPICILLIN/SULBACTAM <=2 SENSITIVE Sensitive     PIP/TAZO <=4 SENSITIVE Sensitive     Extended ESBL NEGATIVE Sensitive     * ESCHERICHIA COLI  Blood Culture ID Panel (Reflexed)     Status: Abnormal   Collection Time: 04/29/19  1:41 PM  Result  Value Ref Range Status   Enterococcus species NOT DETECTED NOT DETECTED Final   Listeria monocytogenes NOT DETECTED NOT DETECTED Final   Staphylococcus species NOT DETECTED NOT DETECTED Final   Staphylococcus aureus (BCID) NOT DETECTED NOT DETECTED Final   Streptococcus species NOT DETECTED NOT DETECTED Final   Streptococcus agalactiae NOT DETECTED NOT DETECTED Final   Streptococcus pneumoniae NOT DETECTED NOT DETECTED Final   Streptococcus pyogenes NOT DETECTED NOT DETECTED Final   Acinetobacter baumannii NOT DETECTED NOT DETECTED Final   Enterobacteriaceae species  DETECTED (A) NOT DETECTED Final    Comment: Enterobacteriaceae represent a large family of gram-negative bacteria, not a single organism. CRITICAL RESULT CALLED TO, READ BACK BY AND VERIFIED WITH: PHARMD L SEAY [id number] MLM    Enterobacter cloacae complex NOT DETECTED NOT DETECTED Final   Escherichia coli DETECTED (A) NOT DETECTED Final    Comment: CRITICAL RESULT CALLED TO, READ BACK BY AND VERIFIED WITH: PHARMD L SEAY [id number] MLM    Klebsiella oxytoca NOT DETECTED NOT DETECTED Final   Klebsiella pneumoniae NOT DETECTED NOT DETECTED Final   Proteus species NOT DETECTED NOT DETECTED Final   Serratia marcescens NOT DETECTED NOT DETECTED Final   Carbapenem resistance NOT DETECTED NOT DETECTED Final   Haemophilus influenzae NOT DETECTED NOT DETECTED Final   Neisseria meningitidis NOT DETECTED NOT DETECTED Final   Pseudomonas aeruginosa NOT DETECTED NOT DETECTED Final   Candida albicans NOT DETECTED NOT DETECTED Final   Candida glabrata NOT DETECTED NOT DETECTED Final   Candida krusei NOT DETECTED NOT DETECTED Final   Candida parapsilosis NOT DETECTED NOT DETECTED Final   Candida tropicalis NOT DETECTED NOT DETECTED Final    Comment: Performed at [facility], [place] [address] [place], [place] [address]  Culture, blood (routine x 2)     Status: None (Preliminary result)   Collection Time: 04/29/19  2:45 PM   Specimen: BLOOD  Result Value Ref Range Status   Specimen Description BLOOD RIGHT ANTECUBITAL  Final   Special Requests   Final    BOTTLES DRAWN AEROBIC AND ANAEROBIC Blood Culture results may not be optimal due to an inadequate volume of blood received in culture bottles   Culture   Final    NO GROWTH 2 DAYS Performed at [facility], [place] [address], [place], [place] [address]    Report Status PENDING  Incomplete  SARS Coronavirus 2 [facility] order, Performed in [facility] hospital lab) Nasopharyngeal Nasopharyngeal Swab     Status: None   Collection Time:  04/29/19  3:06 PM   Specimen: Nasopharyngeal Swab  Result Value Ref Range Status   SARS Coronavirus 2 NEGATIVE NEGATIVE Final    Comment: (NOTE) If result is NEGATIVE SARS-CoV-2 target nucleic acids are NOT DETECTED. The SARS-CoV-2 RNA is generally detectable in upper and lower  respiratory specimens during the acute phase of infection. The lowest  concentration of SARS-CoV-2 viral copies this assay can detect is 250  copies / mL. A negative result does not preclude SARS-CoV-2 infection  and should not be used as the sole basis for treatment or other  patient management decisions.  A negative result may occur with  improper specimen collection / handling, submission of specimen other  than nasopharyngeal swab, presence of viral mutation(s) within the  areas targeted by this assay, and inadequate number of viral copies  (<250 copies / mL). A negative result must be combined with clinical  observations, patient history, and epidemiological information. If result  is POSITIVE SARS-CoV-2 target nucleic acids are DETECTED. The SARS-CoV-2 RNA is generally detectable in upper and lower  respiratory specimens dur ing the acute phase of infection.  Positive  results are indicative of active infection with SARS-CoV-2.  Clinical  correlation with patient history and other diagnostic information is  necessary to determine patient infection status.  Positive results do  not rule out bacterial infection or co-infection with other viruses. If result is PRESUMPTIVE POSTIVE SARS-CoV-2 nucleic acids MAY BE PRESENT.   A presumptive positive result was obtained on the submitted specimen  and confirmed on repeat testing.  While 2019 novel coronavirus  (SARS-CoV-2) nucleic acids may be present in the submitted sample  additional confirmatory testing may be necessary for epidemiological  and / or clinical management purposes  to differentiate between  SARS-CoV-2 and other Sarbecovirus currently known to  infect humans.  If clinically indicated additional testing with an alternate test  methodology [phone]) is advised. The SARS-CoV-2 RNA is generally  detectable in upper and lower respiratory sp ecimens during the acute  phase of infection. The expected result is Negative. Fact Sheet for Patients:  StrictlyIdeas.no Fact Sheet for Healthcare Providers: BankingDealers.co.za This test is not yet approved or cleared by the [place] FDA and has been authorized for detection and/or diagnosis of SARS-CoV-2 by FDA under an Emergency Use Authorization (EUA).  This EUA will remain in effect (meaning this test can be used) for the duration of the COVID-19 declaration under Section 564(b)(1) of the Act, 21 U.S.C. section 360bbb-3(b)(1), unless the authorization is terminated or revoked sooner. Performed at [facility], [place] [address], [place], [place] [address]   Urine culture     Status: Abnormal   Collection Time: 04/29/19  4:13 PM   Specimen: In/Out Cath Urine  Result Value Ref Range Status   Specimen Description IN/OUT CATH URINE  Final   Special Requests   Final    NONE Performed at [facility], [place] [address] [place], [place] [address]    Culture MULTIPLE SPECIES PRESENT, SUGGEST RECOLLECTION (A)  Final   Report Status 04/30/2019 FINAL  Final  MRSA PCR Screening     Status: None   Collection Time: 04/29/19  9:15 PM   Specimen: Nasal Mucosa; Nasopharyngeal  Result Value Ref Range Status   MRSA by PCR NEGATIVE NEGATIVE Final    Comment:        The GeneXpert MRSA Assay (FDA approved for NASAL specimens only), is one component of a comprehensive MRSA colonization surveillance program. It is not intended to diagnose MRSA infection nor to guide or monitor treatment for MRSA infections. Performed at [facility], [place] [address], [place], [place] [address]   Culture, Urine     Status: None   Collection Time:  04/30/19  1:47 PM   Specimen: Urine, Clean Catch  Result Value Ref Range Status   Specimen Description URINE, CLEAN CATCH  Final   Special Requests NONE  Final   Culture   Final    NO GROWTH Performed at [facility], [place] [address], [place], [place] [address]    Report Status 05/01/2019 FINAL  Final     Labs: BNP (last 3 results) Recent Labs    04/30/19 0244  BNP [id number]*   Basic Metabolic Panel: Recent Labs  Lab 04/29/19 1341 04/30/19 0244 05/01/19 0236 05/02/19 0237  NA 140 141 136 138  K 3.6 4.2 3.5 3.7  CL 108 114* 114* 110  CO2 19* 19* 18* 19*  GLUCOSE 120* 90 105* 96  BUN 21 20 14 11   CREATININE 1.43* 1.16 1.01 1.02  CALCIUM 8.5* 7.9* 7.7* 7.8*   Liver Function Tests: Recent Labs  Lab 04/29/19 1341  AST 28  ALT 17  ALKPHOS 62  BILITOT 1.3*  PROT 5.5*  ALBUMIN 3.4*   No results for input(s): LIPASE, AMYLASE in the last 168 hours. No results for input(s): AMMONIA in the last 168 hours. CBC: Recent Labs  Lab 04/29/19 1341 04/30/19 0244 05/01/19 0236 05/02/19 0237  WBC 9.3 15.7* 8.8 6.6  NEUTROABS 8.7*  --   --   --   HGB 13.7 12.3* 11.9* 12.3*  HCT 42.3 37.0* 35.6* 36.4*  MCV 102.4* 101.1* 99.7 98.9  PLT 140* 123* 109* 113*   Cardiac Enzymes: No results for input(s): CKTOTAL, CKMB, CKMBINDEX, TROPONINI in the last 168 hours. BNP: Invalid input(s): POCBNP CBG: No results for input(s): GLUCAP in the last 168 hours. D-Dimer No results for input(s): DDIMER in the last 72 hours. Hgb A1c No results for input(s): HGBA1C in the last 72 hours. Lipid Profile No results for input(s): CHOL, HDL, LDLCALC, TRIG, CHOLHDL, LDLDIRECT in the last 72 hours. Thyroid function studies Recent Labs    04/30/19 0849  TSH 2.952   Anemia work up No results for input(s): VITAMINB12, FOLATE, FERRITIN, TIBC, IRON, RETICCTPCT in the last 72 hours. Urinalysis    Component Value Date/Time   COLORURINE YELLOW 04/29/2019 1615   APPEARANCEUR CLEAR 04/29/2019  1615   LABSPEC 1.010 04/29/2019 1615   PHURINE 5.0 04/29/2019 1615   GLUCOSEU NEGATIVE 04/29/2019 1615   HGBUR MODERATE (A) 04/29/2019 1615   BILIRUBINUR NEGATIVE 04/29/2019 1615   KETONESUR 5 (A) 04/29/2019 1615   PROTEINUR NEGATIVE 04/29/2019 1615   NITRITE NEGATIVE 04/29/2019 1615   LEUKOCYTESUR MODERATE (A) 04/29/2019 1615   Sepsis Labs Invalid input(s): PROCALCITONIN,  WBC,  LACTICIDVEN Microbiology Recent Results (from the past 240 hour(s))  Culture, blood (routine x 2)     Status: Abnormal   Collection Time: 04/29/19  1:41 PM   Specimen: BLOOD  Result Value Ref Range Status   Specimen Description BLOOD LEFT ANTECUBITAL  Final   Special Requests   Final    BOTTLES DRAWN AEROBIC AND ANAEROBIC Blood Culture adequate volume   Culture  Setup Time   Final    GRAM NEGATIVE RODS IN BOTH AEROBIC AND ANAEROBIC BOTTLES CRITICAL RESULT CALLED TO, READ BACK BY AND VERIFIED WITH: Cottie Banda [id number] MLM Performed at [facility], [place] [address] [place], [place] [address]    Culture ESCHERICHIA COLI (A)  Final   Report Status 05/02/2019 FINAL  Final   Organism ID, Bacteria ESCHERICHIA COLI  Final      Susceptibility   Escherichia coli - MIC*    AMPICILLIN <=2 SENSITIVE Sensitive     CEFAZOLIN <=4 SENSITIVE Sensitive     CEFEPIME <=1 SENSITIVE Sensitive     CEFTAZIDIME <=1 SENSITIVE Sensitive     CEFTRIAXONE <=1 SENSITIVE Sensitive     CIPROFLOXACIN <=0.25 SENSITIVE Sensitive     GENTAMICIN <=1 SENSITIVE Sensitive     IMIPENEM <=0.25 SENSITIVE Sensitive     TRIMETH/SULFA <=20 SENSITIVE Sensitive     AMPICILLIN/SULBACTAM <=2 SENSITIVE Sensitive     PIP/TAZO <=4 SENSITIVE Sensitive     Extended ESBL NEGATIVE Sensitive     * ESCHERICHIA COLI  Blood Culture ID Panel (Reflexed)     Status: Abnormal   Collection  Time: 04/29/19  1:41 PM  Result Value Ref Range Status   Enterococcus species NOT DETECTED NOT DETECTED Final   Listeria monocytogenes NOT DETECTED NOT  DETECTED Final   Staphylococcus species NOT DETECTED NOT DETECTED Final   Staphylococcus aureus (BCID) NOT DETECTED NOT DETECTED Final   Streptococcus species NOT DETECTED NOT DETECTED Final   Streptococcus agalactiae NOT DETECTED NOT DETECTED Final   Streptococcus pneumoniae NOT DETECTED NOT DETECTED Final   Streptococcus pyogenes NOT DETECTED NOT DETECTED Final   Acinetobacter baumannii NOT DETECTED NOT DETECTED Final   Enterobacteriaceae species DETECTED (A) NOT DETECTED Final    Comment: Enterobacteriaceae represent a large family of gram-negative bacteria, not a single organism. CRITICAL RESULT CALLED TO, READ BACK BY AND VERIFIED WITH: PHARMD L SEAY [id number] MLM    Enterobacter cloacae complex NOT DETECTED NOT DETECTED Final   Escherichia coli DETECTED (A) NOT DETECTED Final    Comment: CRITICAL RESULT CALLED TO, READ BACK BY AND VERIFIED WITH: PHARMD L SEAY [id number] MLM    Klebsiella oxytoca NOT DETECTED NOT DETECTED Final   Klebsiella pneumoniae NOT DETECTED NOT DETECTED Final   Proteus species NOT DETECTED NOT DETECTED Final   Serratia marcescens NOT DETECTED NOT DETECTED Final   Carbapenem resistance NOT DETECTED NOT DETECTED Final   Haemophilus influenzae NOT DETECTED NOT DETECTED Final   Neisseria meningitidis NOT DETECTED NOT DETECTED Final   Pseudomonas aeruginosa NOT DETECTED NOT DETECTED Final   Candida albicans NOT DETECTED NOT DETECTED Final   Candida glabrata NOT DETECTED NOT DETECTED Final   Candida krusei NOT DETECTED NOT DETECTED Final   Candida parapsilosis NOT DETECTED NOT DETECTED Final   Candida tropicalis NOT DETECTED NOT DETECTED Final    Comment: Performed at [facility], [place] [address], [place], [place] [address]  Culture, blood (routine x 2)     Status: None (Preliminary result)   Collection Time: 04/29/19  2:45 PM   Specimen: BLOOD  Result Value Ref Range Status   Specimen Description BLOOD RIGHT ANTECUBITAL  Final   Special Requests    Final    BOTTLES DRAWN AEROBIC AND ANAEROBIC Blood Culture results may not be optimal due to an inadequate volume of blood received in culture bottles   Culture   Final    NO GROWTH 2 DAYS Performed at [facility], [place] [address] [place], [place] [address]    Report Status PENDING  Incomplete  SARS Coronavirus 2 [facility] order, Performed in [facility] hospital lab) Nasopharyngeal Nasopharyngeal Swab     Status: None   Collection Time: 04/29/19  3:06 PM   Specimen: Nasopharyngeal Swab  Result Value Ref Range Status   SARS Coronavirus 2 NEGATIVE NEGATIVE Final    Comment: (NOTE) If result is NEGATIVE SARS-CoV-2 target nucleic acids are NOT DETECTED. The SARS-CoV-2 RNA is generally detectable in upper and lower  respiratory specimens during the acute phase of infection. The lowest  concentration of SARS-CoV-2 viral copies this assay can detect is 250  copies / mL. A negative result does not preclude SARS-CoV-2 infection  and should not be used as the sole basis for treatment or other  patient management decisions.  A negative result may occur with  improper specimen collection / handling, submission of specimen other  than nasopharyngeal swab, presence of viral mutation(s) within the  areas targeted by this assay, and inadequate number of viral copies  (<250 copies / mL). A negative result must be combined with clinical  observations,  patient history, and epidemiological information. If result is POSITIVE SARS-CoV-2 target nucleic acids are DETECTED. The SARS-CoV-2 RNA is generally detectable in upper and lower  respiratory specimens dur ing the acute phase of infection.  Positive  results are indicative of active infection with SARS-CoV-2.  Clinical  correlation with patient history and other diagnostic information is  necessary to determine patient infection status.  Positive results do  not rule out bacterial infection or co-infection with other viruses. If result is  PRESUMPTIVE POSTIVE SARS-CoV-2 nucleic acids MAY BE PRESENT.   A presumptive positive result was obtained on the submitted specimen  and confirmed on repeat testing.  While 2019 novel coronavirus  (SARS-CoV-2) nucleic acids may be present in the submitted sample  additional confirmatory testing may be necessary for epidemiological  and / or clinical management purposes  to differentiate between  SARS-CoV-2 and other Sarbecovirus currently known to infect humans.  If clinically indicated additional testing with an alternate test  methodology [phone]) is advised. The SARS-CoV-2 RNA is generally  detectable in upper and lower respiratory sp ecimens during the acute  phase of infection. The expected result is Negative. Fact Sheet for Patients:  StrictlyIdeas.no Fact Sheet for Healthcare Providers: BankingDealers.co.za This test is not yet approved or cleared by the [place] FDA and has been authorized for detection and/or diagnosis of SARS-CoV-2 by FDA under an Emergency Use Authorization (EUA).  This EUA will remain in effect (meaning this test can be used) for the duration of the COVID-19 declaration under Section 564(b)(1) of the Act, 21 U.S.C. section 360bbb-3(b)(1), unless the authorization is terminated or revoked sooner. Performed at [facility], [place] [address], [place], [place] [address]   Urine culture     Status: Abnormal   Collection Time: 04/29/19  4:13 PM   Specimen: In/Out Cath Urine  Result Value Ref Range Status   Specimen Description IN/OUT CATH URINE  Final   Special Requests   Final    NONE Performed at [facility], [place] [address], [place], [place] [address]    Culture MULTIPLE SPECIES PRESENT, SUGGEST RECOLLECTION (A)  Final   Report Status 04/30/2019 FINAL  Final  MRSA PCR Screening     Status: None   Collection Time: 04/29/19  9:15 PM   Specimen: Nasal Mucosa; Nasopharyngeal  Result Value Ref  Range Status   MRSA by PCR NEGATIVE NEGATIVE Final    Comment:        The GeneXpert MRSA Assay (FDA approved for NASAL specimens only), is one component of a comprehensive MRSA colonization surveillance program. It is not intended to diagnose MRSA infection nor to guide or monitor treatment for MRSA infections. Performed at [facility], [place] [address], [place], [place] [address]   Culture, Urine     Status: None   Collection Time: 04/30/19  1:47 PM   Specimen: Urine, Clean Catch  Result Value Ref Range Status   Specimen Description URINE, CLEAN CATCH  Final   Special Requests NONE  Final   Culture   Final    NO GROWTH Performed at [facility], [place] [address], [place],  [address]    Report Status 05/01/2019 FINAL  Final     Time coordinating discharge:  35 minutes  SIGNED:   Barb Merino, MD  [facility] 05/02/2019, 1:09 PM

## 2019-05-02 NOTE — Progress Notes (Signed)
Occupational Therapy Evaluation Patient Details Name: Seth Gilbert MRN: [id number] DOB: 06-Oct-1935 Today's Date: 05/02/2019    History of Present Illness As per HPI 83 y.o. male with medical history significant of NHL presenting with weakness, chills/rigors.  Admitted due to septic shock due to e-coli bacteremia possibly due to diverticulitis vs. UTI.   Clinical Impression   PTA, pt independent with ADL and mobility. Pt currently close to baseline level of function. Pt did demonstrate LOB with quick turn. Educated pt on strategies to reduce risk of falls. Pt has HEP to address balance. Encouraged pt to resume balance HEP. Pt verbalized understanding. No further OT needs.     Follow Up Recommendations  No OT follow up;Supervision - Intermittent    Equipment Recommendations  None recommended by OT    Recommendations for Other Services       Precautions / Restrictions Precautions Precautions: Fall      Mobility Bed Mobility Overal bed mobility: Modified Independent                Transfers Overall transfer level: Modified independent                    Balance Overall balance assessment: Needs assistance           Standing balance-Leahy Scale: Fair Standing balance comment: LOB toward R when quickly turning; Pt able to recover with stutter step                           ADL either performed or assessed with clinical judgement   ADL Overall ADL's : At baseline                                       General ADL Comments: REviewed strategies to reduce risk of falls     Vision         Perception     Praxis      Pertinent Vitals/Pain Pain Assessment: No/denies pain     Hand Dominance     Extremity/Trunk Assessment Upper Extremity Assessment Upper Extremity Assessment: Overall WFL for tasks assessed   Lower Extremity Assessment Lower Extremity Assessment: Defer to PT evaluation   Cervical / Trunk  Assessment Cervical / Trunk Assessment: Normal   Communication Communication Communication: HOH   Cognition Arousal/Alertness: Awake/alert Behavior During Therapy: WFL for tasks assessed/performed Overall Cognitive Status: Within Functional Limits for tasks assessed                                     General Comments       Exercises Exercises: Other exercises Other Exercises Other Exercises: Has HEP for balance program. rec pt continue with HEP   Shoulder Instructions      Home Living Family/patient expects to be discharged to:: Private residence Living Arrangements: Spouse/significant other Available Help at Discharge: Available 24 hours/day Type of Home: House Home Access: Level entry     Home Layout: Two level Alternate Level Stairs-Number of Steps: 13 Alternate Level Stairs-Rails: Right Bathroom Shower/Tub: Occupational psychologist: Standard Bathroom Accessibility: Yes How Accessible: Accessible via walker Home Equipment: Shower seat - built in;Grab bars - tub/shower          Prior Functioning/Environment Level of Independence: Independent  OT Problem List:        OT Treatment/Interventions:      OT Goals(Current goals can be found in the care plan section) Acute Rehab OT Goals Patient Stated Goal: To go home OT Goal Formulation: All assessment and education complete, DC therapy  OT Frequency:     Barriers to D/C:            Co-evaluation              AM-PAC OT "6 Clicks" Daily Activity     Outcome Measure Help from another person eating meals?: None Help from another person taking care of personal grooming?: None Help from another person toileting, which includes using toliet, bedpan, or urinal?: None Help from another person bathing (including washing, rinsing, drying)?: None Help from another person to put on and taking off regular upper body clothing?: None Help from another person to put  on and taking off regular lower body clothing?: None 6 Click Score: 24   End of Session Nurse Communication: Mobility status  Activity Tolerance: Patient tolerated treatment well Patient left: in chair;with call bell/phone within reach  OT Visit Diagnosis: Unsteadiness on feet (R26.81)                Time: [id number] OT Time Calculation (min): 20 min Charges:  OT General Charges $OT Visit: 1 Visit OT Evaluation $OT Eval Low Complexity: [place], OT/L   Acute OT Clinical Specialist Acute Rehabilitation Services Pager [phone] Office [phone]   [facility]

## 2019-05-02 NOTE — Progress Notes (Signed)
Removed PIV access and pt received discharge instructions. He understood it well. HS [employer]

## 2019-05-04 LAB — CULTURE, BLOOD (ROUTINE X 2): Special Requests: ADEQUATE

## 2019-05-06 LAB — CULTURE, BLOOD (ROUTINE X 2)

## 2019-05-08 ENCOUNTER — Encounter: Payer: Self-pay | Admitting: Internal Medicine

## 2019-05-11 NOTE — Telephone Encounter (Signed)
Dr. Julien Nordmann, his next scheduled F/U is 09-14-2018 at 2:00 pm.  Anything needed at this time?

## 2019-05-13 DIAGNOSIS — Z09 Encounter for follow-up examination after completed treatment for conditions other than malignant neoplasm: Secondary | ICD-10-CM | POA: Diagnosis not present

## 2019-05-13 DIAGNOSIS — A4151 Sepsis due to Escherichia coli [E. coli]: Secondary | ICD-10-CM | POA: Diagnosis not present

## 2019-05-26 DIAGNOSIS — A4151 Sepsis due to Escherichia coli [E. coli]: Secondary | ICD-10-CM | POA: Diagnosis not present

## 2019-06-03 ENCOUNTER — Inpatient Hospital Stay: Payer: Medicare Other | Attending: Internal Medicine

## 2019-06-03 ENCOUNTER — Other Ambulatory Visit: Payer: Self-pay

## 2019-06-03 DIAGNOSIS — Z9221 Personal history of antineoplastic chemotherapy: Secondary | ICD-10-CM | POA: Insufficient documentation

## 2019-06-03 DIAGNOSIS — Z9225 Personal history of immunosupression therapy: Secondary | ICD-10-CM | POA: Diagnosis not present

## 2019-06-03 DIAGNOSIS — Z95828 Presence of other vascular implants and grafts: Secondary | ICD-10-CM

## 2019-06-03 DIAGNOSIS — Z452 Encounter for adjustment and management of vascular access device: Secondary | ICD-10-CM | POA: Insufficient documentation

## 2019-06-03 DIAGNOSIS — C8338 Diffuse large B-cell lymphoma, lymph nodes of multiple sites: Secondary | ICD-10-CM | POA: Diagnosis not present

## 2019-06-03 MED ORDER — HEPARIN SOD (PORK) LOCK FLUSH 100 UNIT/ML IV SOLN
500.0000 [IU] | Freq: Once | INTRAVENOUS | Status: AC | PRN
  Administered 2019-06-03: 500 [IU] via INTRAVENOUS
  Filled 2019-06-03: qty 5

## 2019-06-03 MED ORDER — SODIUM CHLORIDE 0.9% FLUSH
10.0000 mL | INTRAVENOUS | Status: DC | PRN
  Administered 2019-06-03: 10 mL via INTRAVENOUS
  Filled 2019-06-03: qty 10

## 2019-07-15 ENCOUNTER — Inpatient Hospital Stay: Payer: Medicare Other | Attending: Internal Medicine

## 2019-07-15 ENCOUNTER — Other Ambulatory Visit: Payer: Self-pay

## 2019-07-15 DIAGNOSIS — Z9221 Personal history of antineoplastic chemotherapy: Secondary | ICD-10-CM | POA: Diagnosis not present

## 2019-07-15 DIAGNOSIS — Z9225 Personal history of immunosupression therapy: Secondary | ICD-10-CM | POA: Diagnosis not present

## 2019-07-15 DIAGNOSIS — Z452 Encounter for adjustment and management of vascular access device: Secondary | ICD-10-CM | POA: Insufficient documentation

## 2019-07-15 DIAGNOSIS — Z95828 Presence of other vascular implants and grafts: Secondary | ICD-10-CM

## 2019-07-15 DIAGNOSIS — C8338 Diffuse large B-cell lymphoma, lymph nodes of multiple sites: Secondary | ICD-10-CM

## 2019-07-15 MED ORDER — SODIUM CHLORIDE 0.9% FLUSH
10.0000 mL | INTRAVENOUS | Status: DC | PRN
  Administered 2019-07-15: 10 mL via INTRAVENOUS
  Filled 2019-07-15: qty 10

## 2019-07-15 MED ORDER — HEPARIN SOD (PORK) LOCK FLUSH 100 UNIT/ML IV SOLN
500.0000 [IU] | Freq: Once | INTRAVENOUS | Status: AC | PRN
  Administered 2019-07-15: 14:00:00 500 [IU] via INTRAVENOUS
  Filled 2019-07-15: qty 5

## 2019-09-02 ENCOUNTER — Other Ambulatory Visit: Payer: Self-pay

## 2019-09-02 ENCOUNTER — Inpatient Hospital Stay: Payer: Medicare Other | Attending: Internal Medicine

## 2019-09-02 DIAGNOSIS — K219 Gastro-esophageal reflux disease without esophagitis: Secondary | ICD-10-CM | POA: Diagnosis not present

## 2019-09-02 DIAGNOSIS — Z79899 Other long term (current) drug therapy: Secondary | ICD-10-CM | POA: Insufficient documentation

## 2019-09-02 DIAGNOSIS — Z9225 Personal history of immunosupression therapy: Secondary | ICD-10-CM | POA: Diagnosis not present

## 2019-09-02 DIAGNOSIS — Z9221 Personal history of antineoplastic chemotherapy: Secondary | ICD-10-CM | POA: Insufficient documentation

## 2019-09-02 DIAGNOSIS — Z452 Encounter for adjustment and management of vascular access device: Secondary | ICD-10-CM | POA: Diagnosis not present

## 2019-09-02 DIAGNOSIS — C8338 Diffuse large B-cell lymphoma, lymph nodes of multiple sites: Secondary | ICD-10-CM | POA: Diagnosis present

## 2019-09-02 DIAGNOSIS — Z95828 Presence of other vascular implants and grafts: Secondary | ICD-10-CM

## 2019-09-02 MED ORDER — SODIUM CHLORIDE 0.9% FLUSH
10.0000 mL | INTRAVENOUS | Status: DC | PRN
  Administered 2019-09-02: 10 mL via INTRAVENOUS
  Filled 2019-09-02: qty 10

## 2019-09-02 MED ORDER — HEPARIN SOD (PORK) LOCK FLUSH 100 UNIT/ML IV SOLN
500.0000 [IU] | Freq: Once | INTRAVENOUS | Status: AC | PRN
  Administered 2019-09-02: 500 [IU] via INTRAVENOUS
  Filled 2019-09-02: qty 5

## 2019-09-15 ENCOUNTER — Inpatient Hospital Stay: Payer: Medicare Other

## 2019-09-15 ENCOUNTER — Encounter: Payer: Self-pay | Admitting: Internal Medicine

## 2019-09-15 ENCOUNTER — Inpatient Hospital Stay: Payer: Medicare Other | Admitting: Internal Medicine

## 2019-09-15 ENCOUNTER — Other Ambulatory Visit: Payer: Self-pay

## 2019-09-15 VITALS — BP 156/75 | HR 100 | Temp 97.9°F | Resp 20 | Ht 68.0 in | Wt 195.9 lb

## 2019-09-15 DIAGNOSIS — Z79899 Other long term (current) drug therapy: Secondary | ICD-10-CM | POA: Diagnosis not present

## 2019-09-15 DIAGNOSIS — Z452 Encounter for adjustment and management of vascular access device: Secondary | ICD-10-CM | POA: Diagnosis not present

## 2019-09-15 DIAGNOSIS — C8338 Diffuse large B-cell lymphoma, lymph nodes of multiple sites: Secondary | ICD-10-CM

## 2019-09-15 DIAGNOSIS — Z9225 Personal history of immunosupression therapy: Secondary | ICD-10-CM | POA: Diagnosis not present

## 2019-09-15 DIAGNOSIS — K219 Gastro-esophageal reflux disease without esophagitis: Secondary | ICD-10-CM | POA: Diagnosis not present

## 2019-09-15 DIAGNOSIS — Z9221 Personal history of antineoplastic chemotherapy: Secondary | ICD-10-CM | POA: Diagnosis not present

## 2019-09-15 LAB — CBC WITH DIFFERENTIAL (CANCER CENTER ONLY)
Abs Immature Granulocytes: 0.01 10*3/uL (ref 0.00–0.07)
Basophils Absolute: 0 10*3/uL (ref 0.0–0.1)
Basophils Relative: 1 %
Eosinophils Absolute: 0.1 10*3/uL (ref 0.0–0.5)
Eosinophils Relative: 1 %
HCT: 42.9 % (ref 39.0–52.0)
Hemoglobin: 14 g/dL (ref 13.0–17.0)
Immature Granulocytes: 0 %
Lymphocytes Relative: 24 %
Lymphs Abs: 1.6 10*3/uL (ref 0.7–4.0)
MCH: 32.8 pg (ref 26.0–34.0)
MCHC: 32.6 g/dL (ref 30.0–36.0)
MCV: 100.5 fL — ABNORMAL HIGH (ref 80.0–100.0)
Monocytes Absolute: 0.7 10*3/uL (ref 0.1–1.0)
Monocytes Relative: 11 %
Neutro Abs: 4.1 10*3/uL (ref 1.7–7.7)
Neutrophils Relative %: 63 %
Platelet Count: 177 10*3/uL (ref 150–400)
RBC: 4.27 MIL/uL (ref 4.22–5.81)
RDW: 13.5 % (ref 11.5–15.5)
WBC Count: 6.5 10*3/uL (ref 4.0–10.5)
nRBC: 0 % (ref 0.0–0.2)

## 2019-09-15 LAB — CMP (CANCER CENTER ONLY)
ALT: 22 U/L (ref 0–44)
AST: 24 U/L (ref 15–41)
Albumin: 4 g/dL (ref 3.5–5.0)
Alkaline Phosphatase: 69 U/L (ref 38–126)
Anion gap: 9 (ref 5–15)
BUN: 18 mg/dL (ref 8–23)
CO2: 26 mmol/L (ref 22–32)
Calcium: 8.6 mg/dL — ABNORMAL LOW (ref 8.9–10.3)
Chloride: 106 mmol/L (ref 98–111)
Creatinine: 1.06 mg/dL (ref 0.61–1.24)
GFR, Est AFR Am: >60 mL/min (ref >60)
GFR, Estimated: >60 mL/min (ref >60)
Glucose, Bld: 110 mg/dL — ABNORMAL HIGH (ref 70–99)
Potassium: 4 mmol/L (ref 3.5–5.1)
Sodium: 141 mmol/L (ref 135–145)
Total Bilirubin: 0.6 mg/dL (ref 0.3–1.2)
Total Protein: 6.6 g/dL (ref 6.5–8.1)

## 2019-09-15 LAB — LACTATE DEHYDROGENASE: LDH: 154 U/L (ref 98–192)

## 2019-09-15 MED ORDER — LIDOCAINE-PRILOCAINE 2.5-2.5 % EX CREA: 1.0000 "application " | TOPICAL_CREAM | CUTANEOUS | 2 refills | Status: DC | PRN

## 2019-09-15 NOTE — Progress Notes (Signed)
[facility] [phone]   [phone]  OFFICE PROGRESS NOTE  Lawerance Cruel, MD [place] [address]  DIAGNOSIS: Stage II large B-cell non-Hodgkin lymphoma diagnosed in August 2017.  PRIOR THERAPY: Systemic chemotherapy with CHOP/Rituxan every 3 weeks with Neulasta support, status post 6 cycles.  Last dose was given on 08/14/2016.  CURRENT THERAPY: Observation..  INTERVAL HISTORY: Seth Gilbert 83 y.o. male returns to the clinic today for follow-up visit.  The patient is feeling fine today with no concerning complaints.  In September 2020 he was diagnosed with diverticulitis and treated with a course of antibiotics.  He spent several days in the hospital during that time.  He was also dehydrated and had septic shock.  He is feeling much better now with no concerning complaints.  He denied having any current chest pain, shortness of breath, cough or hemoptysis.  He denied having any nausea, vomiting, diarrhea or constipation.  He has no headache or visual changes.  Is here today for evaluation and repeat blood work.  MEDICAL HISTORY: Past Medical History:  Diagnosis Date  . Diverticulitis   . GERD (gastroesophageal reflux disease)    Heartburn at times  . Heart murmur    "comes and goes" onset 4th grade - last time anyone heard it was 01/2014  . History of hiatal hernia   . History of kidney stones   . HOH (hard of hearing)   . Non-Hodgkin lymphoma ([place])   . Peripheral neuropathy 09/11/2016  . PONV (postoperative nausea and vomiting)   . Rosacea   . Wears glasses     ALLERGIES:  is allergic to no known allergies.  MEDICATIONS:  Current Outpatient Medications  Medication Sig Dispense Refill  . famotidine (PEPCID) 40 MG tablet Take 40 mg by mouth daily as needed for heartburn.     . Glucosamine HCl (GLUCOSAMINE PO) Take 1,800 mg by mouth 3 (three) times daily.    Marland Kitchen levothyroxine (SYNTHROID, LEVOTHROID) 50 MCG  tablet Take 1 tablet by mouth daily.  2  . lidocaine-prilocaine (EMLA) cream Apply 1 application topically as needed. 30 g 2  . metroNIDAZOLE (METROGEL) 0.75 % gel Apply 1 application topically daily.     . Multiple Vitamins-Minerals (CENTRUM SILVER PO) Take 1 tablet by mouth daily.     . rosuvastatin (CRESTOR) 20 MG tablet Take 20 mg by mouth daily.    Marland Kitchen senna (SENOKOT) 8.6 MG tablet Take 1 tablet by mouth daily as needed for constipation.    . vitamin E 400 UNIT capsule Take 400 Units by mouth daily.     No current facility-administered medications for this visit.    SURGICAL HISTORY:  Past Surgical History:  Procedure Laterality Date  . APPENDECTOMY     2-3 yrs old  . COLONOSCOPY    . HEMORROIDECTOMY     inflammed anal gland removed  . INGUINAL HERNIA REPAIR Left 02/11/2014   Procedure: LEFT INGUINAL HERNIA REPAIR WITH MESH ;  Surgeon: Joyice Faster. Cornett, MD;  Location: [facility];  Service: General;  Laterality: Left;  . INSERTION OF MESH N/A 02/11/2014   Procedure: INSERTION OF MESH;  Surgeon: Joyice Faster. Cornett, MD;  Location: [facility];  Service: General;  Laterality: N/A;  . PORTACATH PLACEMENT Left 04/10/2016   Procedure: INSERTION PORT-A-CATH into LEFT Subclavian Vein with guidance from ultrasound and flouroscopy;  Surgeon: Grace Isaac, MD;  Location: [facility];  Service: Thoracic;  Laterality: Left;  . TONSILLECTOMY    . VIDEO BRONCHOSCOPY WITH ENDOBRONCHIAL ULTRASOUND N/A 03/30/2016   Procedure: VIDEO BRONCHOSCOPY WITH ENDOBRONCHIAL ULTRASOUND,WITH TRANSBRONCHIAL BIOPSY;  Surgeon: Grace Isaac, MD;  [facility];  Service: Thoracic;  Laterality: N/A;    REVIEW OF SYSTEMS:  A comprehensive review of systems was negative.   PHYSICAL EXAMINATION: General appearance: alert, cooperative and no distress Head: Normocephalic, without obvious abnormality, atraumatic Neck: no adenopathy, no JVD, supple, symmetrical, trachea midline and thyroid not  enlarged, symmetric, no tenderness/mass/nodules Lymph nodes: Cervical, supraclavicular, and axillary nodes normal. Resp: clear to auscultation bilaterally Back: symmetric, no curvature. ROM normal. No CVA tenderness. Cardio: regular rate and rhythm, S1, S2 normal, no murmur, click, rub or gallop GI: soft, non-tender; bowel sounds normal; no masses,  no organomegaly Extremities: extremities normal, atraumatic, no cyanosis or edema  ECOG PERFORMANCE STATUS: 0 - Asymptomatic  Blood pressure (!) 156/75, pulse 100, temperature 97.9 F (36.6 C), temperature source Temporal, resp. rate 20, height 5\' 8"  (1.727 m), weight 195 lb 14.4 oz (88.9 kg), SpO2 100 %.  LABORATORY DATA: Lab Results  Component Value Date   WBC 6.6 05/02/2019   HGB 12.3 (L) 05/02/2019   HCT 36.4 (L) 05/02/2019   MCV 98.9 05/02/2019   PLT 113 (L) 05/02/2019      Chemistry      Component Value Date/Time   NA 138 05/02/2019 0237   NA 140 02/26/2017 1326   K 3.7 05/02/2019 0237   K 4.3 02/26/2017 1326   CL 110 05/02/2019 0237   CO2 19 (L) 05/02/2019 0237   CO2 26 02/26/2017 1326   BUN 11 05/02/2019 0237   BUN 15.7 02/26/2017 1326   CREATININE 1.02 05/02/2019 0237   CREATININE 1.01 03/10/2019 1443   CREATININE 0.9 02/26/2017 1326      Component Value Date/Time   CALCIUM 7.8 (L) 05/02/2019 0237   CALCIUM 9.2 02/26/2017 1326   ALKPHOS 62 04/29/2019 1341   ALKPHOS 74 02/26/2017 1326   AST 28 04/29/2019 1341   AST 18 03/10/2019 1443   AST 20 02/26/2017 1326   ALT 17 04/29/2019 1341   ALT 14 03/10/2019 1443   ALT 15 02/26/2017 1326   BILITOT 1.3 (H) 04/29/2019 1341   BILITOT 0.5 03/10/2019 1443   BILITOT 0.47 02/26/2017 1326       RADIOGRAPHIC STUDIES: No results found.  ASSESSMENT AND PLAN: This is a very pleasant 83 years old white male with stage II large B-cell non-Hodgkin lymphoma diagnosed in August 2017. He is status post 6 cycles of systemic chemotherapy with CHOP/Rituxan. He tolerated his  treatment fairly well except for chemotherapy-induced pancytopenia and weakness.  The patient is currently on observation and he is feeling fine today with no concerning complaints.  His last CT scan of the chest, abdomen and pelvis in September 2020 showed no evidence for disease recurrence or progression of the lymphoma. CBC today is unremarkable.  Comprehensive metabolic panel is still pending. I recommended for the patient to continue on observation with repeat CT scan of the chest, abdomen and pelvis in 8 months. He will have Port-A-Cath flush every 2 months during this.Marland Kitchen He was advised to call immediately if he has any concerning symptoms in the interval. The patient voices understanding of current disease status and treatment options and is in agreement with the current care plan. All questions were answered. The patient knows to call the clinic with any problems, questions or concerns. We can certainly see the patient  much sooner if necessary.  Disclaimer: This note was dictated with voice recognition software. Similar sounding words can inadvertently be transcribed and may not be corrected upon review.

## 2019-09-16 ENCOUNTER — Ambulatory Visit: Payer: Medicare Other | Attending: Internal Medicine

## 2019-09-16 ENCOUNTER — Telehealth: Payer: Self-pay | Admitting: Internal Medicine

## 2019-09-16 DIAGNOSIS — Z23 Encounter for immunization: Secondary | ICD-10-CM

## 2019-09-16 NOTE — Telephone Encounter (Signed)
Scheduled per 1/19 los. Called and left a msg. Mailing printout

## 2019-09-16 NOTE — Progress Notes (Signed)
   Covid-19 Vaccination Clinic  Name:  Seth Gilbert    MRN: [id number] DOB: 1935-09-20  09/16/2019  [name] was observed post Covid-19 immunization for 15 minutes without incidence. He was provided with Vaccine Information Sheet and instruction to access the V-Safe system.   [name] was instructed to call 911 with any severe reactions post vaccine: Marland Kitchen Difficulty breathing  . Swelling of your face and throat  . A fast heartbeat  . A bad rash all over your body  . Dizziness and weakness    Immunizations Administered    Name Date Dose VIS Date Route   Pfizer COVID-19 Vaccine 09/16/2019  1:36 PM 0.3 mL 08/07/2019 Intramuscular   Manufacturer: [place]   Lot: [id number]   [place]: [id number]

## 2019-09-18 ENCOUNTER — Telehealth: Payer: Self-pay | Admitting: Internal Medicine

## 2019-09-18 NOTE — Telephone Encounter (Signed)
Patient called to reschedule upcoming appts through sept. Rescheduled appts per patient request.

## 2019-09-22 ENCOUNTER — Telehealth: Payer: Self-pay | Admitting: Medical Oncology

## 2019-09-22 NOTE — Telephone Encounter (Signed)
Flush appts.should not include labs. He wants scan appt the day after labs because he has trouble with diarrhea after drinking the contrast . Schedule message sent.

## 2019-09-24 ENCOUNTER — Telehealth: Payer: Self-pay | Admitting: Internal Medicine

## 2019-09-24 NOTE — Telephone Encounter (Signed)
I talk with patient regarding schedule  

## 2019-10-07 ENCOUNTER — Ambulatory Visit: Payer: Medicare Other | Attending: Internal Medicine

## 2019-10-07 DIAGNOSIS — Z23 Encounter for immunization: Secondary | ICD-10-CM | POA: Insufficient documentation

## 2019-10-07 NOTE — Progress Notes (Signed)
   Covid-19 Vaccination Clinic  Name:  Seth Gilbert    MRN: [id number] DOB: 03/06/1936  10/07/2019  Seth Gilbert was observed post Covid-19 immunization for 15 minutes without incidence. He was provided with Vaccine Information Sheet and instruction to access the V-Safe system.   Seth Gilbert was instructed to call 911 with any severe reactions post vaccine: Marland Kitchen Difficulty breathing  . Swelling of your face and throat  . A fast heartbeat  . A bad rash all over your body  . Dizziness and weakness    Immunizations Administered    Name Date Dose VIS Date Route   Pfizer COVID-19 Vaccine 10/07/2019  3:13 PM 0.3 mL 08/07/2019 Intramuscular   Manufacturer: [employer], [employer]   Lot: [id number]   [place]: [id number]

## 2019-10-23 DIAGNOSIS — E039 Hypothyroidism, unspecified: Secondary | ICD-10-CM | POA: Diagnosis not present

## 2019-10-23 DIAGNOSIS — E7800 Pure hypercholesterolemia, unspecified: Secondary | ICD-10-CM | POA: Diagnosis not present

## 2019-10-23 DIAGNOSIS — I700 Atherosclerosis of aorta: Secondary | ICD-10-CM | POA: Diagnosis not present

## 2019-10-23 DIAGNOSIS — Z79899 Other long term (current) drug therapy: Secondary | ICD-10-CM | POA: Diagnosis not present

## 2019-11-02 ENCOUNTER — Inpatient Hospital Stay: Payer: Medicare Other | Attending: Internal Medicine

## 2019-11-02 ENCOUNTER — Other Ambulatory Visit: Payer: Self-pay

## 2019-11-02 DIAGNOSIS — Z452 Encounter for adjustment and management of vascular access device: Secondary | ICD-10-CM | POA: Diagnosis not present

## 2019-11-02 DIAGNOSIS — Z95828 Presence of other vascular implants and grafts: Secondary | ICD-10-CM

## 2019-11-02 DIAGNOSIS — C8338 Diffuse large B-cell lymphoma, lymph nodes of multiple sites: Secondary | ICD-10-CM | POA: Diagnosis not present

## 2019-11-02 MED ORDER — SODIUM CHLORIDE 0.9% FLUSH
10.0000 mL | INTRAVENOUS | Status: DC | PRN
  Administered 2019-11-02: 10 mL via INTRAVENOUS
  Filled 2019-11-02: qty 10

## 2019-11-02 MED ORDER — HEPARIN SOD (PORK) LOCK FLUSH 100 UNIT/ML IV SOLN
500.0000 [IU] | Freq: Once | INTRAVENOUS | Status: AC | PRN
  Administered 2019-11-02: 500 [IU] via INTRAVENOUS
  Filled 2019-11-02: qty 5

## 2019-11-02 NOTE — Patient Instructions (Signed)
Implanted Port Home Guide An implanted port is a type of central line that is placed under the skin. Central lines are used to provide IV access when treatment or nutrition needs to be given through a person's veins. Implanted ports are used for long-term IV access. An implanted port may be placed because:  You need IV medicine that would be irritating to the small veins in your hands or arms.  You need long-term IV medicines, such as antibiotics.  You need IV nutrition for a long period.  You need frequent blood draws for lab tests.  You need dialysis.  Implanted ports are usually placed in the chest area, but they can also be placed in the upper arm, the abdomen, or the leg. An implanted port has two main parts:  Reservoir. The reservoir is round and will appear as a small, raised area under your skin. The reservoir is the part where a needle is inserted to give medicines or draw blood.  Catheter. The catheter is a thin, flexible tube that extends from the reservoir. The catheter is placed into a large vein. Medicine that is inserted into the reservoir goes into the catheter and then into the vein.  How will I care for my incision site? Do not get the incision site wet. Bathe or shower as directed by your health care provider. How is my port accessed? Special steps must be taken to access the port:  Before the port is accessed, a numbing cream can be placed on the skin. This helps numb the skin over the port site.  Your health care provider uses a sterile technique to access the port. ? Your health care provider must put on a mask and sterile gloves. ? The skin over your port is cleaned carefully with an antiseptic and allowed to dry. ? The port is gently pinched between sterile gloves, and a needle is inserted into the port.  Only "non-coring" port needles should be used to access the port. Once the port is accessed, a blood return should be checked. This helps ensure that the port  is in the vein and is not clogged.  If your port needs to remain accessed for a constant infusion, a clear (transparent) bandage will be placed over the needle site. The bandage and needle will need to be changed every week, or as directed by your health care provider.  Keep the bandage covering the needle clean and dry. Do not get it wet. Follow your health care provider's instructions on how to take a shower or bath while the port is accessed.  If your port does not need to stay accessed, no bandage is needed over the port.  What is flushing? Flushing helps keep the port from getting clogged. Follow your health care provider's instructions on how and when to flush the port. Ports are usually flushed with saline solution or a medicine called heparin. The need for flushing will depend on how the port is used.  If the port is used for intermittent medicines or blood draws, the port will need to be flushed: ? After medicines have been given. ? After blood has been drawn. ? As part of routine maintenance.  If a constant infusion is running, the port may not need to be flushed.  How long will my port stay implanted? The port can stay in for as long as your health care provider thinks it is needed. When it is time for the port to come out, surgery will be   done to remove it. The procedure is similar to the one performed when the port was put in. When should I seek immediate medical care? When you have an implanted port, you should seek immediate medical care if:  You notice a bad smell coming from the incision site.  You have swelling, redness, or drainage at the incision site.  You have more swelling or pain at the port site or the surrounding area.  You have a fever that is not controlled with medicine.  This information is not intended to replace advice given to you by your health care provider. Make sure you discuss any questions you have with your health care provider. Document  Released: 08/13/2005 Document Revised: 01/19/2016 Document Reviewed: 04/20/2013 Elsevier Interactive Patient Education  2017 [employer]  

## 2019-12-30 ENCOUNTER — Other Ambulatory Visit: Payer: Self-pay

## 2019-12-30 ENCOUNTER — Inpatient Hospital Stay: Payer: Medicare Other | Attending: Internal Medicine

## 2019-12-30 DIAGNOSIS — C8338 Diffuse large B-cell lymphoma, lymph nodes of multiple sites: Secondary | ICD-10-CM | POA: Insufficient documentation

## 2019-12-30 DIAGNOSIS — Z95828 Presence of other vascular implants and grafts: Secondary | ICD-10-CM

## 2019-12-30 DIAGNOSIS — Z452 Encounter for adjustment and management of vascular access device: Secondary | ICD-10-CM | POA: Diagnosis not present

## 2019-12-30 MED ORDER — SODIUM CHLORIDE 0.9% FLUSH
10.0000 mL | INTRAVENOUS | Status: DC | PRN
  Administered 2019-12-30 (×2): 10 mL via INTRAVENOUS
  Filled 2019-12-30: qty 10

## 2019-12-30 MED ORDER — HEPARIN SOD (PORK) LOCK FLUSH 100 UNIT/ML IV SOLN
500.0000 [IU] | Freq: Once | INTRAVENOUS | Status: AC | PRN
  Administered 2019-12-30: 500 [IU] via INTRAVENOUS
  Filled 2019-12-30: qty 5

## 2019-12-30 NOTE — Patient Instructions (Signed)

## 2020-03-02 ENCOUNTER — Other Ambulatory Visit: Payer: Self-pay

## 2020-03-02 ENCOUNTER — Inpatient Hospital Stay: Payer: Medicare Other | Attending: Internal Medicine

## 2020-03-02 DIAGNOSIS — Z452 Encounter for adjustment and management of vascular access device: Secondary | ICD-10-CM | POA: Insufficient documentation

## 2020-03-02 DIAGNOSIS — Z9221 Personal history of antineoplastic chemotherapy: Secondary | ICD-10-CM | POA: Insufficient documentation

## 2020-03-02 DIAGNOSIS — C8338 Diffuse large B-cell lymphoma, lymph nodes of multiple sites: Secondary | ICD-10-CM | POA: Insufficient documentation

## 2020-03-02 DIAGNOSIS — Z95828 Presence of other vascular implants and grafts: Secondary | ICD-10-CM

## 2020-03-02 MED ORDER — SODIUM CHLORIDE 0.9% FLUSH
10.0000 mL | INTRAVENOUS | Status: DC | PRN
  Administered 2020-03-02: 10 mL via INTRAVENOUS
  Filled 2020-03-02: qty 10

## 2020-03-02 MED ORDER — HEPARIN SOD (PORK) LOCK FLUSH 100 UNIT/ML IV SOLN
500.0000 [IU] | Freq: Once | INTRAVENOUS | Status: AC | PRN
  Administered 2020-03-02: 500 [IU] via INTRAVENOUS
  Filled 2020-03-02: qty 5

## 2020-03-02 NOTE — Patient Instructions (Signed)

## 2020-04-06 ENCOUNTER — Inpatient Hospital Stay: Payer: Medicare Other | Attending: Internal Medicine

## 2020-04-06 ENCOUNTER — Other Ambulatory Visit: Payer: Self-pay

## 2020-04-06 DIAGNOSIS — C8338 Diffuse large B-cell lymphoma, lymph nodes of multiple sites: Secondary | ICD-10-CM

## 2020-04-06 DIAGNOSIS — Z452 Encounter for adjustment and management of vascular access device: Secondary | ICD-10-CM | POA: Diagnosis not present

## 2020-04-06 DIAGNOSIS — Z95828 Presence of other vascular implants and grafts: Secondary | ICD-10-CM

## 2020-04-06 MED ORDER — SODIUM CHLORIDE 0.9% FLUSH
10.0000 mL | INTRAVENOUS | Status: DC | PRN
  Administered 2020-04-06: 10 mL via INTRAVENOUS
  Filled 2020-04-06: qty 10

## 2020-04-06 MED ORDER — HEPARIN SOD (PORK) LOCK FLUSH 100 UNIT/ML IV SOLN
500.0000 [IU] | Freq: Once | INTRAVENOUS | Status: AC | PRN
  Administered 2020-04-06: 500 [IU] via INTRAVENOUS
  Filled 2020-04-06: qty 5

## 2020-04-06 NOTE — Patient Instructions (Signed)

## 2020-04-27 DIAGNOSIS — D485 Neoplasm of uncertain behavior of skin: Secondary | ICD-10-CM | POA: Diagnosis not present

## 2020-04-27 DIAGNOSIS — L578 Other skin changes due to chronic exposure to nonionizing radiation: Secondary | ICD-10-CM | POA: Diagnosis not present

## 2020-04-27 DIAGNOSIS — L719 Rosacea, unspecified: Secondary | ICD-10-CM | POA: Diagnosis not present

## 2020-04-27 DIAGNOSIS — L570 Actinic keratosis: Secondary | ICD-10-CM | POA: Diagnosis not present

## 2020-04-27 DIAGNOSIS — L430 Hypertrophic lichen planus: Secondary | ICD-10-CM | POA: Diagnosis not present

## 2020-05-11 ENCOUNTER — Ambulatory Visit (HOSPITAL_COMMUNITY): Payer: Medicare Other

## 2020-05-11 ENCOUNTER — Inpatient Hospital Stay: Payer: Medicare Other

## 2020-05-11 ENCOUNTER — Inpatient Hospital Stay: Payer: Medicare Other | Attending: Internal Medicine

## 2020-05-11 ENCOUNTER — Other Ambulatory Visit: Payer: Self-pay

## 2020-05-11 DIAGNOSIS — Z9049 Acquired absence of other specified parts of digestive tract: Secondary | ICD-10-CM | POA: Diagnosis not present

## 2020-05-11 DIAGNOSIS — Z9221 Personal history of antineoplastic chemotherapy: Secondary | ICD-10-CM | POA: Insufficient documentation

## 2020-05-11 DIAGNOSIS — Z79899 Other long term (current) drug therapy: Secondary | ICD-10-CM | POA: Diagnosis not present

## 2020-05-11 DIAGNOSIS — Z87442 Personal history of urinary calculi: Secondary | ICD-10-CM | POA: Insufficient documentation

## 2020-05-11 DIAGNOSIS — C8338 Diffuse large B-cell lymphoma, lymph nodes of multiple sites: Secondary | ICD-10-CM | POA: Diagnosis not present

## 2020-05-11 DIAGNOSIS — K219 Gastro-esophageal reflux disease without esophagitis: Secondary | ICD-10-CM | POA: Insufficient documentation

## 2020-05-11 DIAGNOSIS — Z95828 Presence of other vascular implants and grafts: Secondary | ICD-10-CM

## 2020-05-11 DIAGNOSIS — Z452 Encounter for adjustment and management of vascular access device: Secondary | ICD-10-CM | POA: Diagnosis present

## 2020-05-11 LAB — CBC WITH DIFFERENTIAL (CANCER CENTER ONLY)
Abs Immature Granulocytes: 0.01 10*3/uL (ref 0.00–0.07)
Basophils Absolute: 0 10*3/uL (ref 0.0–0.1)
Basophils Relative: 0 %
Eosinophils Absolute: 0.1 10*3/uL (ref 0.0–0.5)
Eosinophils Relative: 1 %
HCT: 39.9 % (ref 39.0–52.0)
Hemoglobin: 13.3 g/dL (ref 13.0–17.0)
Immature Granulocytes: 0 %
Lymphocytes Relative: 25 %
Lymphs Abs: 1.7 10*3/uL (ref 0.7–4.0)
MCH: 32.7 pg (ref 26.0–34.0)
MCHC: 33.3 g/dL (ref 30.0–36.0)
MCV: 98 fL (ref 80.0–100.0)
Monocytes Absolute: 0.8 10*3/uL (ref 0.1–1.0)
Monocytes Relative: 12 %
Neutro Abs: 4.3 10*3/uL (ref 1.7–7.7)
Neutrophils Relative %: 62 %
Platelet Count: 149 10*3/uL — ABNORMAL LOW (ref 150–400)
RBC: 4.07 MIL/uL — ABNORMAL LOW (ref 4.22–5.81)
RDW: 13.4 % (ref 11.5–15.5)
WBC Count: 6.9 10*3/uL (ref 4.0–10.5)
nRBC: 0 % (ref 0.0–0.2)

## 2020-05-11 LAB — LACTATE DEHYDROGENASE: LDH: 179 U/L (ref 98–192)

## 2020-05-11 LAB — CMP (CANCER CENTER ONLY)
ALT: 22 U/L (ref 0–44)
AST: 27 U/L (ref 15–41)
Albumin: 3.7 g/dL (ref 3.5–5.0)
Alkaline Phosphatase: 65 U/L (ref 38–126)
Anion gap: 6 (ref 5–15)
BUN: 17 mg/dL (ref 8–23)
CO2: 28 mmol/L (ref 22–32)
Calcium: 9.1 mg/dL (ref 8.9–10.3)
Chloride: 106 mmol/L (ref 98–111)
Creatinine: 1.01 mg/dL (ref 0.61–1.24)
GFR, Est AFR Am: >60 mL/min (ref >60)
GFR, Estimated: >60 mL/min (ref >60)
Glucose, Bld: 135 mg/dL — ABNORMAL HIGH (ref 70–99)
Potassium: 4.3 mmol/L (ref 3.5–5.1)
Sodium: 140 mmol/L (ref 135–145)
Total Bilirubin: 0.6 mg/dL (ref 0.3–1.2)
Total Protein: 6.4 g/dL — ABNORMAL LOW (ref 6.5–8.1)

## 2020-05-11 MED ORDER — SODIUM CHLORIDE 0.9% FLUSH
10.0000 mL | INTRAVENOUS | Status: DC | PRN
  Administered 2020-05-11: 10 mL via INTRAVENOUS
  Filled 2020-05-11: qty 10

## 2020-05-11 MED ORDER — HEPARIN SOD (PORK) LOCK FLUSH 100 UNIT/ML IV SOLN
500.0000 [IU] | Freq: Once | INTRAVENOUS | Status: AC | PRN
  Administered 2020-05-11: 500 [IU] via INTRAVENOUS
  Filled 2020-05-11: qty 5

## 2020-05-12 ENCOUNTER — Ambulatory Visit (HOSPITAL_COMMUNITY)
Admission: RE | Admit: 2020-05-12 | Discharge: 2020-05-12 | Disposition: A | Discharge status: Home / Self Care | Payer: Medicare Other | Source: Ambulatory Visit | Attending: Internal Medicine | Admitting: Internal Medicine

## 2020-05-12 DIAGNOSIS — C8338 Diffuse large B-cell lymphoma, lymph nodes of multiple sites: Secondary | ICD-10-CM | POA: Diagnosis not present

## 2020-05-12 DIAGNOSIS — K5730 Diverticulosis of large intestine without perforation or abscess without bleeding: Secondary | ICD-10-CM | POA: Diagnosis not present

## 2020-05-12 DIAGNOSIS — K449 Diaphragmatic hernia without obstruction or gangrene: Secondary | ICD-10-CM | POA: Diagnosis not present

## 2020-05-12 DIAGNOSIS — C8590 Non-Hodgkin lymphoma, unspecified, unspecified site: Secondary | ICD-10-CM | POA: Diagnosis not present

## 2020-05-12 DIAGNOSIS — I700 Atherosclerosis of aorta: Secondary | ICD-10-CM | POA: Diagnosis not present

## 2020-05-12 MED ORDER — IOHEXOL 300 MG/ML  SOLN
100.0000 mL | Freq: Once | INTRAMUSCULAR | Status: AC | PRN
  Administered 2020-05-12: 100 mL via INTRAVENOUS

## 2020-05-13 ENCOUNTER — Other Ambulatory Visit: Payer: Medicare Other

## 2020-05-16 ENCOUNTER — Other Ambulatory Visit: Payer: Self-pay

## 2020-05-16 ENCOUNTER — Inpatient Hospital Stay: Payer: Medicare Other | Admitting: Internal Medicine

## 2020-05-16 ENCOUNTER — Ambulatory Visit: Payer: Medicare Other | Admitting: Internal Medicine

## 2020-05-16 ENCOUNTER — Encounter: Payer: Self-pay | Admitting: Internal Medicine

## 2020-05-16 VITALS — BP 139/78 | HR 89 | Temp 98.9°F | Wt 198.0 lb

## 2020-05-16 DIAGNOSIS — Z87442 Personal history of urinary calculi: Secondary | ICD-10-CM | POA: Diagnosis not present

## 2020-05-16 DIAGNOSIS — Z452 Encounter for adjustment and management of vascular access device: Secondary | ICD-10-CM | POA: Diagnosis not present

## 2020-05-16 DIAGNOSIS — Z9049 Acquired absence of other specified parts of digestive tract: Secondary | ICD-10-CM | POA: Diagnosis not present

## 2020-05-16 DIAGNOSIS — K219 Gastro-esophageal reflux disease without esophagitis: Secondary | ICD-10-CM | POA: Diagnosis not present

## 2020-05-16 DIAGNOSIS — C8338 Diffuse large B-cell lymphoma, lymph nodes of multiple sites: Secondary | ICD-10-CM | POA: Diagnosis not present

## 2020-05-16 DIAGNOSIS — Z9221 Personal history of antineoplastic chemotherapy: Secondary | ICD-10-CM | POA: Diagnosis not present

## 2020-05-16 DIAGNOSIS — Z79899 Other long term (current) drug therapy: Secondary | ICD-10-CM | POA: Diagnosis not present

## 2020-05-16 NOTE — Progress Notes (Signed)
[facility] [phone]   Fax:([phone]  OFFICE PROGRESS NOTE  Lawerance Cruel, MD [place] [address]  DIAGNOSIS: Stage II large B-cell non-Hodgkin lymphoma diagnosed in August 2017.  PRIOR THERAPY: Systemic chemotherapy with CHOP/Rituxan every 3 weeks with Neulasta support, status post 6 cycles.  Last dose was given on 08/14/2016.  CURRENT THERAPY: Observation..  INTERVAL HISTORY: Seth Gilbert 84 y.o. male returns to the clinic today for 6 months follow-up visit.  The patient is feeling fine today with no concerning complaints.  He denied having any current chest pain, shortness of breath, cough or hemoptysis.  He denied having any fever or chills.  He has no nausea, vomiting, diarrhea or constipation.  He has no significant weight loss or night sweats.  He has no palpable lymphadenopathy.  He is here today for evaluation with repeat blood work as well as CT scan of the chest, abdomen and pelvis.  MEDICAL HISTORY: Past Medical History:  Diagnosis Date   Diverticulitis    GERD (gastroesophageal reflux disease)    Heartburn at times   Heart murmur    "comes and goes" onset 4th grade - last time anyone heard it was 01/2014   History of hiatal hernia    History of kidney stones    HOH (hard of hearing)    Non-Hodgkin lymphoma ([place])    Peripheral neuropathy 09/11/2016   PONV (postoperative nausea and vomiting)    Rosacea    Wears glasses     ALLERGIES:  is allergic to no known allergies.  MEDICATIONS:  Current Outpatient Medications  Medication Sig Dispense Refill   famotidine (PEPCID) 40 MG tablet Take 40 mg by mouth daily as needed for heartburn.      Glucosamine HCl (GLUCOSAMINE PO) Take 1,800 mg by mouth 3 (three) times daily.     levothyroxine (SYNTHROID, LEVOTHROID) 50 MCG tablet Take 1 tablet by mouth daily.  2   lidocaine-prilocaine (EMLA) cream Apply 1 application topically as needed. 30 g  2   metroNIDAZOLE (METROGEL) 0.75 % gel Apply 1 application topically daily.      Multiple Vitamins-Minerals (CENTRUM SILVER PO) Take 1 tablet by mouth daily.      rosuvastatin (CRESTOR) 20 MG tablet Take 20 mg by mouth daily.     senna (SENOKOT) 8.6 MG tablet Take 1 tablet by mouth daily as needed for constipation.     vitamin E 400 UNIT capsule Take 400 Units by mouth daily.     No current facility-administered medications for this visit.    SURGICAL HISTORY:  Past Surgical History:  Procedure Laterality Date   APPENDECTOMY     2-3 yrs old   COLONOSCOPY     HEMORROIDECTOMY     inflammed anal gland removed   INGUINAL HERNIA REPAIR Left 02/11/2014   Procedure: LEFT INGUINAL HERNIA REPAIR WITH MESH ;  Surgeon: Joyice Faster. Cornett, MD;  Location: [facility];  Service: General;  Laterality: Left;   INSERTION OF MESH N/A 02/11/2014   Procedure: INSERTION OF MESH;  Surgeon: Joyice Faster. Cornett, MD;  Location: [facility];  Service: General;  Laterality: N/A;   PORTACATH PLACEMENT Left 04/10/2016   Procedure: INSERTION PORT-A-CATH into LEFT Subclavian Vein with guidance from ultrasound and flouroscopy;  Surgeon: Grace Isaac, MD;  Location: [facility];  Service: Thoracic;  Laterality: Left;   TONSILLECTOMY     VIDEO BRONCHOSCOPY WITH ENDOBRONCHIAL ULTRASOUND N/A 03/30/2016  Procedure: VIDEO BRONCHOSCOPY WITH ENDOBRONCHIAL ULTRASOUND,WITH TRANSBRONCHIAL BIOPSY;  Surgeon: Grace Isaac, MD;  [facility];  Service: Thoracic;  Laterality: N/A;    REVIEW OF SYSTEMS:  A comprehensive review of systems was negative.   PHYSICAL EXAMINATION: General appearance: alert, cooperative and no distress Head: Normocephalic, without obvious abnormality, atraumatic Neck: no adenopathy, no JVD, supple, symmetrical, trachea midline and thyroid not enlarged, symmetric, no tenderness/mass/nodules Lymph nodes: Cervical, supraclavicular, and axillary nodes normal. Resp:  clear to auscultation bilaterally Back: symmetric, no curvature. ROM normal. No CVA tenderness. Cardio: regular rate and rhythm, S1, S2 normal, no murmur, click, rub or gallop GI: soft, non-tender; bowel sounds normal; no masses,  no organomegaly Extremities: extremities normal, atraumatic, no cyanosis or edema  ECOG PERFORMANCE STATUS: 0 - Asymptomatic  Blood pressure 139/78, pulse 89, temperature 98.9 F (37.2 C), weight 198 lb (89.8 kg), SpO2 100 %.  LABORATORY DATA: Lab Results  Component Value Date   WBC 6.9 05/11/2020   HGB 13.3 05/11/2020   HCT 39.9 05/11/2020   MCV 98.0 05/11/2020   PLT 149 (L) 05/11/2020      Chemistry      Component Value Date/Time   NA 140 05/11/2020 1440   NA 140 02/26/2017 1326   K 4.3 05/11/2020 1440   K 4.3 02/26/2017 1326   CL 106 05/11/2020 1440   CO2 28 05/11/2020 1440   CO2 26 02/26/2017 1326   BUN 17 05/11/2020 1440   BUN 15.7 02/26/2017 1326   CREATININE 1.01 05/11/2020 1440   CREATININE 0.9 02/26/2017 1326      Component Value Date/Time   CALCIUM 9.1 05/11/2020 1440   CALCIUM 9.2 02/26/2017 1326   ALKPHOS 65 05/11/2020 1440   ALKPHOS 74 02/26/2017 1326   AST 27 05/11/2020 1440   AST 20 02/26/2017 1326   ALT 22 05/11/2020 1440   ALT 15 02/26/2017 1326   BILITOT 0.6 05/11/2020 1440   BILITOT 0.47 02/26/2017 1326       RADIOGRAPHIC STUDIES: CT Chest W Contrast  Result Date: 05/13/2020 CLINICAL DATA:  Restaging lymphoma. EXAM: CT CHEST, ABDOMEN, AND PELVIS WITH CONTRAST TECHNIQUE: Multidetector CT imaging of the chest, abdomen and pelvis was performed following the standard protocol during bolus administration of intravenous contrast. CONTRAST:  158mL OMNIPAQUE IOHEXOL 300 MG/ML  SOLN COMPARISON:  04/29/2019 FINDINGS: CT CHEST FINDINGS Cardiovascular: The heart size appears normal. Trace pericardial effusion, unchanged. Aortic atherosclerosis. Coronary artery calcifications. Mediastinum/Nodes: Normal appearance of the thyroid  gland. The trachea appears patent and is midline. Normal appearance of the esophagus. No enlarged axillary, supraclavicular, mediastinal, or hilar lymph nodes. Lungs/Pleura: No pleural effusion. No suspicious pulmonary nodule or mass. Musculoskeletal: Spondylosis noted within the thoracic spine. No acute or suspicious osseous findings. CT ABDOMEN PELVIS FINDINGS Hepatobiliary: No focal liver abnormality is seen. No gallstones, gallbladder wall thickening, or biliary dilatation. Pancreas: Unremarkable. No pancreatic ductal dilatation or surrounding inflammatory changes. Spleen: Normal in size without focal abnormality. Adrenals/Urinary Tract: Normal appearance of the adrenal glands. No kidney mass or hydronephrosis identified bilaterally. Stomach/Bowel: Moderate size hiatal hernia. Stomach is nondistended. No bowel wall thickening, inflammation or distension. Colonic diverticulosis noted without acute inflammation. Vascular/Lymphatic: Aortic atherosclerosis. No aneurysm. No abdominopelvic adenopathy. Reproductive: Prostate gland calcifications. Other: No free fluid or fluid collections. Musculoskeletal: Bones appear osteopenic. Degenerative disc disease noted in the lumbar spine. No acute or suspicious osseous findings. IMPRESSION: 1. No acute findings within the chest, abdomen or pelvis. No evidence for recurrent lymphoma. 2. Hiatal hernia. 3. Coronary  artery calcifications. Aortic Atherosclerosis (ICD10-I70.0). Electronically Signed   By: Kerby Moors M.D.   On: 05/13/2020 10:14   CT Abdomen Pelvis W Contrast  Result Date: 05/13/2020 CLINICAL DATA:  Restaging lymphoma. EXAM: CT CHEST, ABDOMEN, AND PELVIS WITH CONTRAST TECHNIQUE: Multidetector CT imaging of the chest, abdomen and pelvis was performed following the standard protocol during bolus administration of intravenous contrast. CONTRAST:  127mL OMNIPAQUE IOHEXOL 300 MG/ML  SOLN COMPARISON:  04/29/2019 FINDINGS: CT CHEST FINDINGS Cardiovascular: The heart  size appears normal. Trace pericardial effusion, unchanged. Aortic atherosclerosis. Coronary artery calcifications. Mediastinum/Nodes: Normal appearance of the thyroid gland. The trachea appears patent and is midline. Normal appearance of the esophagus. No enlarged axillary, supraclavicular, mediastinal, or hilar lymph nodes. Lungs/Pleura: No pleural effusion. No suspicious pulmonary nodule or mass. Musculoskeletal: Spondylosis noted within the thoracic spine. No acute or suspicious osseous findings. CT ABDOMEN PELVIS FINDINGS Hepatobiliary: No focal liver abnormality is seen. No gallstones, gallbladder wall thickening, or biliary dilatation. Pancreas: Unremarkable. No pancreatic ductal dilatation or surrounding inflammatory changes. Spleen: Normal in size without focal abnormality. Adrenals/Urinary Tract: Normal appearance of the adrenal glands. No kidney mass or hydronephrosis identified bilaterally. Stomach/Bowel: Moderate size hiatal hernia. Stomach is nondistended. No bowel wall thickening, inflammation or distension. Colonic diverticulosis noted without acute inflammation. Vascular/Lymphatic: Aortic atherosclerosis. No aneurysm. No abdominopelvic adenopathy. Reproductive: Prostate gland calcifications. Other: No free fluid or fluid collections. Musculoskeletal: Bones appear osteopenic. Degenerative disc disease noted in the lumbar spine. No acute or suspicious osseous findings. IMPRESSION: 1. No acute findings within the chest, abdomen or pelvis. No evidence for recurrent lymphoma. 2. Hiatal hernia. 3. Coronary artery calcifications. Aortic Atherosclerosis (ICD10-I70.0). Electronically Signed   By: Kerby Moors M.D.   On: 05/13/2020 10:14    ASSESSMENT AND PLAN: This is a very pleasant 84 years old white male with stage II large B-cell non-Hodgkin lymphoma diagnosed in August 2017. He is status post 6 cycles of systemic chemotherapy with CHOP/Rituxan. He tolerated his treatment fairly well except for  chemotherapy-induced pancytopenia and weakness.  The patient is currently on observation and he is feeling fine today with no concerning complaints. He had repeat CT scan of the chest, abdomen and pelvis that showed no acute findings or evidence for lymphoma recurrence. I recommended for him to continue on observation with repeat blood work including CBC, comprehensive metabolic panel and LDH in 1 year. The patient was advised to call immediately if he has any concerning symptoms in the interval. The patient voices understanding of current disease status and treatment options and is in agreement with the current care plan. All questions were answered. The patient knows to call the clinic with any problems, questions or concerns. We can certainly see the patient much sooner if necessary.  Disclaimer: This note was dictated with voice recognition software. Similar sounding words can inadvertently be transcribed and may not be corrected upon review.

## 2020-05-17 ENCOUNTER — Telehealth: Payer: Self-pay | Admitting: Internal Medicine

## 2020-05-17 NOTE — Telephone Encounter (Signed)
Scheduled per los. Called and left msg. Mailed printlout

## 2020-05-25 DIAGNOSIS — Z0000 Encounter for general adult medical examination without abnormal findings: Secondary | ICD-10-CM | POA: Diagnosis not present

## 2020-05-25 DIAGNOSIS — E039 Hypothyroidism, unspecified: Secondary | ICD-10-CM | POA: Diagnosis not present

## 2020-05-25 DIAGNOSIS — E7800 Pure hypercholesterolemia, unspecified: Secondary | ICD-10-CM | POA: Diagnosis not present

## 2020-05-25 DIAGNOSIS — H9190 Unspecified hearing loss, unspecified ear: Secondary | ICD-10-CM | POA: Diagnosis not present

## 2020-05-26 ENCOUNTER — Telehealth: Payer: Self-pay | Admitting: Internal Medicine

## 2020-05-26 NOTE — Telephone Encounter (Signed)
Scheduled appt per 9/30 sch msg - pt aware of next appt

## 2020-06-02 DIAGNOSIS — Z23 Encounter for immunization: Secondary | ICD-10-CM | POA: Diagnosis not present

## 2020-06-07 ENCOUNTER — Ambulatory Visit: Payer: Medicare Other | Attending: Internal Medicine

## 2020-06-07 DIAGNOSIS — Z23 Encounter for immunization: Secondary | ICD-10-CM

## 2020-06-07 NOTE — Progress Notes (Signed)
   Covid-19 Vaccination Clinic  Name:  Seth Gilbert    MRN: [id number] DOB: [birth year]  06/07/2020  Seth Gilbert was observed post Covid-19 immunization for 15 minutes without incident. He was provided with Vaccine Information Sheet and instruction to access the V-Safe system.   Seth Gilbert was instructed to call 911 with any severe reactions post vaccine: Marland Kitchen Difficulty breathing  . Swelling of face and throat  . A fast heartbeat  . A bad rash all over body  . Dizziness and weakness

## 2020-06-14 DIAGNOSIS — M85851 Other specified disorders of bone density and structure, right thigh: Secondary | ICD-10-CM | POA: Diagnosis not present

## 2020-06-14 DIAGNOSIS — M85852 Other specified disorders of bone density and structure, left thigh: Secondary | ICD-10-CM | POA: Diagnosis not present

## 2020-06-22 ENCOUNTER — Other Ambulatory Visit: Payer: Self-pay

## 2020-06-22 ENCOUNTER — Inpatient Hospital Stay: Payer: Medicare Other | Attending: Internal Medicine

## 2020-06-22 DIAGNOSIS — C8338 Diffuse large B-cell lymphoma, lymph nodes of multiple sites: Secondary | ICD-10-CM

## 2020-06-22 DIAGNOSIS — Z95828 Presence of other vascular implants and grafts: Secondary | ICD-10-CM

## 2020-06-22 DIAGNOSIS — Z452 Encounter for adjustment and management of vascular access device: Secondary | ICD-10-CM | POA: Insufficient documentation

## 2020-06-22 MED ORDER — HEPARIN SOD (PORK) LOCK FLUSH 100 UNIT/ML IV SOLN
500.0000 [IU] | Freq: Once | INTRAVENOUS | Status: AC | PRN
  Administered 2020-06-22: 500 [IU] via INTRAVENOUS
  Filled 2020-06-22: qty 5

## 2020-06-22 MED ORDER — SODIUM CHLORIDE 0.9% FLUSH
10.0000 mL | INTRAVENOUS | Status: DC | PRN
  Administered 2020-06-22: 10 mL via INTRAVENOUS
  Filled 2020-06-22: qty 10

## 2020-06-22 NOTE — Patient Instructions (Signed)

## 2020-08-03 ENCOUNTER — Other Ambulatory Visit: Payer: Self-pay

## 2020-08-03 ENCOUNTER — Inpatient Hospital Stay: Payer: Medicare Other | Attending: Internal Medicine

## 2020-08-03 DIAGNOSIS — C8338 Diffuse large B-cell lymphoma, lymph nodes of multiple sites: Secondary | ICD-10-CM | POA: Diagnosis not present

## 2020-08-03 DIAGNOSIS — Z452 Encounter for adjustment and management of vascular access device: Secondary | ICD-10-CM | POA: Diagnosis not present

## 2020-09-14 ENCOUNTER — Inpatient Hospital Stay: Payer: Medicare Other | Attending: Internal Medicine

## 2020-09-14 ENCOUNTER — Other Ambulatory Visit: Payer: Self-pay

## 2020-09-14 DIAGNOSIS — C8338 Diffuse large B-cell lymphoma, lymph nodes of multiple sites: Secondary | ICD-10-CM | POA: Diagnosis present

## 2020-09-14 DIAGNOSIS — Z452 Encounter for adjustment and management of vascular access device: Secondary | ICD-10-CM | POA: Insufficient documentation

## 2020-09-14 DIAGNOSIS — C8333 Diffuse large B-cell lymphoma, intra-abdominal lymph nodes: Secondary | ICD-10-CM | POA: Insufficient documentation

## 2020-09-14 DIAGNOSIS — Z95828 Presence of other vascular implants and grafts: Secondary | ICD-10-CM

## 2020-09-14 MED ORDER — HEPARIN SOD (PORK) LOCK FLUSH 100 UNIT/ML IV SOLN
500.0000 [IU] | Freq: Once | INTRAVENOUS | Status: AC | PRN
  Administered 2020-09-14: 500 [IU] via INTRAVENOUS
  Filled 2020-09-14: qty 5

## 2020-09-14 MED ORDER — SODIUM CHLORIDE 0.9% FLUSH
10.0000 mL | INTRAVENOUS | Status: DC | PRN
  Administered 2020-09-14: 10 mL via INTRAVENOUS
  Filled 2020-09-14: qty 10

## 2020-10-26 ENCOUNTER — Inpatient Hospital Stay: Payer: Medicare Other | Attending: Internal Medicine

## 2020-10-26 ENCOUNTER — Other Ambulatory Visit: Payer: Self-pay

## 2020-10-26 DIAGNOSIS — Z452 Encounter for adjustment and management of vascular access device: Secondary | ICD-10-CM | POA: Diagnosis not present

## 2020-10-26 DIAGNOSIS — C8338 Diffuse large B-cell lymphoma, lymph nodes of multiple sites: Secondary | ICD-10-CM

## 2020-10-26 DIAGNOSIS — Z95828 Presence of other vascular implants and grafts: Secondary | ICD-10-CM

## 2020-10-26 MED ORDER — SODIUM CHLORIDE 0.9% FLUSH
10.0000 mL | INTRAVENOUS | Status: DC | PRN
  Administered 2020-10-26: 10 mL via INTRAVENOUS
  Filled 2020-10-26: qty 10

## 2020-10-26 MED ORDER — HEPARIN SOD (PORK) LOCK FLUSH 100 UNIT/ML IV SOLN
500.0000 [IU] | Freq: Once | INTRAVENOUS | Status: AC | PRN
  Administered 2020-10-26: 500 [IU] via INTRAVENOUS
  Filled 2020-10-26: qty 5

## 2020-10-26 NOTE — Patient Instructions (Signed)

## 2020-12-07 ENCOUNTER — Inpatient Hospital Stay: Payer: Medicare Other | Attending: Internal Medicine

## 2020-12-07 ENCOUNTER — Other Ambulatory Visit: Payer: Self-pay

## 2020-12-07 DIAGNOSIS — Z452 Encounter for adjustment and management of vascular access device: Secondary | ICD-10-CM | POA: Diagnosis not present

## 2020-12-07 DIAGNOSIS — C8338 Diffuse large B-cell lymphoma, lymph nodes of multiple sites: Secondary | ICD-10-CM | POA: Diagnosis not present

## 2020-12-16 ENCOUNTER — Other Ambulatory Visit (HOSPITAL_BASED_OUTPATIENT_CLINIC_OR_DEPARTMENT_OTHER): Payer: Self-pay

## 2020-12-16 ENCOUNTER — Other Ambulatory Visit: Payer: Self-pay

## 2020-12-16 ENCOUNTER — Ambulatory Visit: Payer: Medicare Other | Attending: Internal Medicine

## 2020-12-16 DIAGNOSIS — Z23 Encounter for immunization: Secondary | ICD-10-CM

## 2020-12-16 MED ORDER — COVID-19 MRNA VAC-TRIS(PFIZER) 30 MCG/0.3ML IM SUSP
INTRAMUSCULAR | 0 refills | Status: DC
  Filled 2020-12-16: qty 0.3, 1d supply, fill #0

## 2020-12-16 NOTE — Progress Notes (Signed)
   Covid-19 Vaccination Clinic  Name:  MUKUND WEINREB    MRN: [id number] DOB: 1936-08-06  12/16/2020  Mr. Hitchens was observed post Covid-19 immunization for 15 minutes without incident. He was provided with Vaccine Information Sheet and instruction to access the V-Safe system.   Mr. Vanloan was instructed to call 911 with any severe reactions post vaccine: Marland Kitchen Difficulty breathing  . Swelling of face and throat  . A fast heartbeat  . A bad rash all over body  . Dizziness and weakness   Immunizations Administered    Name Date Dose VIS Date Route   PFIZER Comrnaty(Gray TOP) Covid-19 Vaccine 12/16/2020  3:51 PM 0.3 mL 08/04/2020 Intramuscular   Manufacturer: [employer]   Lot: [id number]   [place]: [phone]

## 2020-12-23 ENCOUNTER — Other Ambulatory Visit (HOSPITAL_BASED_OUTPATIENT_CLINIC_OR_DEPARTMENT_OTHER): Payer: Self-pay

## 2021-01-18 ENCOUNTER — Inpatient Hospital Stay: Payer: Medicare Other | Attending: Internal Medicine

## 2021-01-18 ENCOUNTER — Other Ambulatory Visit: Payer: Self-pay

## 2021-01-18 DIAGNOSIS — Z452 Encounter for adjustment and management of vascular access device: Secondary | ICD-10-CM | POA: Diagnosis not present

## 2021-01-18 DIAGNOSIS — Z95828 Presence of other vascular implants and grafts: Secondary | ICD-10-CM

## 2021-01-18 DIAGNOSIS — C8338 Diffuse large B-cell lymphoma, lymph nodes of multiple sites: Secondary | ICD-10-CM | POA: Insufficient documentation

## 2021-01-18 MED ORDER — SODIUM CHLORIDE 0.9% FLUSH
10.0000 mL | INTRAVENOUS | Status: DC | PRN
  Administered 2021-01-18: 10 mL via INTRAVENOUS
  Filled 2021-01-18: qty 10

## 2021-01-18 MED ORDER — HEPARIN SOD (PORK) LOCK FLUSH 100 UNIT/ML IV SOLN
500.0000 [IU] | Freq: Once | INTRAVENOUS | Status: AC | PRN
  Administered 2021-01-18: 500 [IU] via INTRAVENOUS
  Filled 2021-01-18: qty 5

## 2021-03-01 ENCOUNTER — Inpatient Hospital Stay: Payer: Medicare Other | Attending: Internal Medicine

## 2021-03-01 ENCOUNTER — Other Ambulatory Visit: Payer: Self-pay

## 2021-03-01 DIAGNOSIS — Z452 Encounter for adjustment and management of vascular access device: Secondary | ICD-10-CM | POA: Diagnosis not present

## 2021-03-01 DIAGNOSIS — C8510 Unspecified B-cell lymphoma, unspecified site: Secondary | ICD-10-CM | POA: Insufficient documentation

## 2021-03-01 DIAGNOSIS — C8338 Diffuse large B-cell lymphoma, lymph nodes of multiple sites: Secondary | ICD-10-CM

## 2021-03-01 DIAGNOSIS — Z95828 Presence of other vascular implants and grafts: Secondary | ICD-10-CM

## 2021-03-01 MED ORDER — HEPARIN SOD (PORK) LOCK FLUSH 100 UNIT/ML IV SOLN
500.0000 [IU] | Freq: Once | INTRAVENOUS | Status: AC | PRN
  Administered 2021-03-01: 500 [IU] via INTRAVENOUS
  Filled 2021-03-01: qty 5

## 2021-03-01 MED ORDER — SODIUM CHLORIDE 0.9% FLUSH
10.0000 mL | INTRAVENOUS | Status: DC | PRN
  Administered 2021-03-01: 10 mL via INTRAVENOUS
  Filled 2021-03-01: qty 10

## 2021-03-01 NOTE — Patient Instructions (Signed)

## 2021-04-12 ENCOUNTER — Inpatient Hospital Stay: Payer: Medicare Other | Attending: Internal Medicine

## 2021-04-12 ENCOUNTER — Other Ambulatory Visit: Payer: Self-pay

## 2021-04-12 DIAGNOSIS — Z452 Encounter for adjustment and management of vascular access device: Secondary | ICD-10-CM | POA: Insufficient documentation

## 2021-04-12 DIAGNOSIS — C8510 Unspecified B-cell lymphoma, unspecified site: Secondary | ICD-10-CM | POA: Insufficient documentation

## 2021-04-12 DIAGNOSIS — C8338 Diffuse large B-cell lymphoma, lymph nodes of multiple sites: Secondary | ICD-10-CM

## 2021-04-12 DIAGNOSIS — Z95828 Presence of other vascular implants and grafts: Secondary | ICD-10-CM

## 2021-04-12 MED ORDER — SODIUM CHLORIDE 0.9% FLUSH
10.0000 mL | INTRAVENOUS | Status: DC | PRN
  Administered 2021-04-12: 10 mL via INTRAVENOUS

## 2021-04-12 MED ORDER — HEPARIN SOD (PORK) LOCK FLUSH 100 UNIT/ML IV SOLN
500.0000 [IU] | Freq: Once | INTRAVENOUS | Status: AC | PRN
  Administered 2021-04-12: 500 [IU] via INTRAVENOUS

## 2021-04-25 ENCOUNTER — Telehealth: Payer: Self-pay | Admitting: Internal Medicine

## 2021-04-25 NOTE — Telephone Encounter (Signed)
R/s 9/20 appt per provider pal. Called and left msg.

## 2021-05-04 ENCOUNTER — Encounter: Payer: Self-pay | Admitting: Internal Medicine

## 2021-05-10 DIAGNOSIS — L719 Rosacea, unspecified: Secondary | ICD-10-CM | POA: Diagnosis not present

## 2021-05-16 ENCOUNTER — Ambulatory Visit: Payer: Medicare Other | Admitting: Internal Medicine

## 2021-05-16 ENCOUNTER — Other Ambulatory Visit: Payer: Medicare Other

## 2021-05-19 ENCOUNTER — Other Ambulatory Visit: Payer: Self-pay | Admitting: Medical Oncology

## 2021-05-19 DIAGNOSIS — C8338 Diffuse large B-cell lymphoma, lymph nodes of multiple sites: Secondary | ICD-10-CM

## 2021-05-22 ENCOUNTER — Inpatient Hospital Stay: Payer: Medicare Other | Attending: Internal Medicine

## 2021-05-22 ENCOUNTER — Other Ambulatory Visit: Payer: Self-pay

## 2021-05-22 ENCOUNTER — Inpatient Hospital Stay: Payer: Medicare Other | Admitting: Internal Medicine

## 2021-05-22 VITALS — BP 129/69 | HR 90 | Temp 96.6°F | Resp 18 | Wt 196.4 lb

## 2021-05-22 DIAGNOSIS — C8338 Diffuse large B-cell lymphoma, lymph nodes of multiple sites: Secondary | ICD-10-CM

## 2021-05-22 DIAGNOSIS — C8510 Unspecified B-cell lymphoma, unspecified site: Secondary | ICD-10-CM | POA: Insufficient documentation

## 2021-05-22 DIAGNOSIS — Z95828 Presence of other vascular implants and grafts: Secondary | ICD-10-CM

## 2021-05-22 LAB — CMP (CANCER CENTER ONLY)
ALT: 22 U/L (ref 0–44)
AST: 29 U/L (ref 15–41)
Albumin: 3.8 g/dL (ref 3.5–5.0)
Alkaline Phosphatase: 61 U/L (ref 38–126)
Anion gap: 8 (ref 5–15)
BUN: 23 mg/dL (ref 8–23)
CO2: 27 mmol/L (ref 22–32)
Calcium: 9.5 mg/dL (ref 8.9–10.3)
Chloride: 107 mmol/L (ref 98–111)
Creatinine: 1.06 mg/dL (ref 0.61–1.24)
GFR, Estimated: >60 mL/min (ref >60)
Glucose, Bld: 126 mg/dL — ABNORMAL HIGH (ref 70–99)
Potassium: 4.4 mmol/L (ref 3.5–5.1)
Sodium: 142 mmol/L (ref 135–145)
Total Bilirubin: 0.6 mg/dL (ref 0.3–1.2)
Total Protein: 6.5 g/dL (ref 6.5–8.1)

## 2021-05-22 LAB — CBC WITH DIFFERENTIAL (CANCER CENTER ONLY)
Abs Immature Granulocytes: 0.02 10*3/uL (ref 0.00–0.07)
Basophils Absolute: 0 10*3/uL (ref 0.0–0.1)
Basophils Relative: 0 %
Eosinophils Absolute: 0.1 10*3/uL (ref 0.0–0.5)
Eosinophils Relative: 1 %
HCT: 39.2 % (ref 39.0–52.0)
Hemoglobin: 13.3 g/dL (ref 13.0–17.0)
Immature Granulocytes: 0 %
Lymphocytes Relative: 24 %
Lymphs Abs: 1.8 10*3/uL (ref 0.7–4.0)
MCH: 33 pg (ref 26.0–34.0)
MCHC: 33.9 g/dL (ref 30.0–36.0)
MCV: 97.3 fL (ref 80.0–100.0)
Monocytes Absolute: 0.8 10*3/uL (ref 0.1–1.0)
Monocytes Relative: 11 %
Neutro Abs: 4.7 10*3/uL (ref 1.7–7.7)
Neutrophils Relative %: 64 %
Platelet Count: 162 10*3/uL (ref 150–400)
RBC: 4.03 MIL/uL — ABNORMAL LOW (ref 4.22–5.81)
RDW: 13.4 % (ref 11.5–15.5)
WBC Count: 7.4 10*3/uL (ref 4.0–10.5)
nRBC: 0 % (ref 0.0–0.2)

## 2021-05-22 LAB — LACTATE DEHYDROGENASE: LDH: 234 U/L — ABNORMAL HIGH (ref 98–192)

## 2021-05-22 MED ORDER — HEPARIN SOD (PORK) LOCK FLUSH 100 UNIT/ML IV SOLN
500.0000 [IU] | Freq: Once | INTRAVENOUS | Status: AC | PRN
  Administered 2021-05-22: 500 [IU] via INTRAVENOUS

## 2021-05-22 MED ORDER — SODIUM CHLORIDE 0.9% FLUSH
10.0000 mL | INTRAVENOUS | Status: DC | PRN
  Administered 2021-05-22: 10 mL via INTRAVENOUS

## 2021-05-22 NOTE — Progress Notes (Signed)
[facility] [phone]   Fax:(336) [phone]  OFFICE PROGRESS NOTE  Lawerance Cruel, MD [place] [address]  DIAGNOSIS: Stage II large B-cell non-Hodgkin lymphoma diagnosed in August 2017.  PRIOR THERAPY: Systemic chemotherapy with CHOP/Rituxan every 3 weeks with Neulasta support, status post 6 cycles.  Last dose was given on 08/14/2016.  CURRENT THERAPY: Observation..  INTERVAL HISTORY: Seth Gilbert 85 y.o. male returns to the clinic today for annual follow-up visit.  The patient is feeling fine today with no concerning complaints except for pain on the right side after he pulled a muscle in that area.  He denied having any current chest pain, shortness of breath, cough or hemoptysis.  He denied having any fever or chills.  He has no nausea, vomiting, diarrhea or constipation.  He has no headache or visual changes.  He has no weight loss or night sweats.  MEDICAL HISTORY: Past Medical History:  Diagnosis Date   Diverticulitis    GERD (gastroesophageal reflux disease)    Heartburn at times   Heart murmur    "comes and goes" onset 4th grade - last time anyone heard it was 01/2014   History of hiatal hernia    History of kidney stones    HOH (hard of hearing)    Non-Hodgkin lymphoma ([place])    Peripheral neuropathy 09/11/2016   PONV (postoperative nausea and vomiting)    Rosacea    Wears glasses     ALLERGIES:  is allergic to no known allergies.  MEDICATIONS:  Current Outpatient Medications  Medication Sig Dispense Refill   ampicillin (PRINCIPEN) 500 MG capsule Take 1 capsule by mouth at bedtime.     COVID-19 mRNA Vac-TriS, Pfizer, SUSP injection Inject into the muscle. 0.3 mL 0   famotidine (PEPCID) 40 MG tablet Take 40 mg by mouth daily as needed for heartburn.      Glucosamine HCl (GLUCOSAMINE PO) Take 1,800 mg by mouth 3 (three) times daily.     levothyroxine (SYNTHROID, LEVOTHROID) 50 MCG tablet Take 1 tablet by  mouth daily.  2   lidocaine-prilocaine (EMLA) cream Apply 1 application topically as needed. 30 g 2   metroNIDAZOLE (METROGEL) 0.75 % gel Apply 1 application topically daily.      Multiple Vitamins-Minerals (CENTRUM SILVER PO) Take 1 tablet by mouth daily.      rosuvastatin (CRESTOR) 20 MG tablet Take 20 mg by mouth daily.     senna (SENOKOT) 8.6 MG tablet Take 1 tablet by mouth daily as needed for constipation.     vitamin E 400 UNIT capsule Take 400 Units by mouth daily.     No current facility-administered medications for this visit.    SURGICAL HISTORY:  Past Surgical History:  Procedure Laterality Date   APPENDECTOMY     2-3 yrs old   COLONOSCOPY     HEMORROIDECTOMY     inflammed anal gland removed   INGUINAL HERNIA REPAIR Left 02/11/2014   Procedure: LEFT INGUINAL HERNIA REPAIR WITH MESH ;  Surgeon: Joyice Faster. Cornett, MD;  Location: [facility];  Service: General;  Laterality: Left;   INSERTION OF MESH N/A 02/11/2014   Procedure: INSERTION OF MESH;  Surgeon: Joyice Faster. Cornett, MD;  Location: [facility];  Service: General;  Laterality: N/A;   PORTACATH PLACEMENT Left 04/10/2016   Procedure: INSERTION PORT-A-CATH into LEFT Subclavian Vein with guidance from ultrasound and flouroscopy;  Surgeon: Grace Isaac, MD;  Location: [facility];  Service: Thoracic;  Laterality: Left;   TONSILLECTOMY     VIDEO BRONCHOSCOPY WITH ENDOBRONCHIAL ULTRASOUND N/A 03/30/2016   Procedure: VIDEO BRONCHOSCOPY WITH ENDOBRONCHIAL ULTRASOUND,WITH TRANSBRONCHIAL BIOPSY;  Surgeon: Grace Isaac, MD;  Location: [facility];  Service: Thoracic;  Laterality: N/A;    REVIEW OF SYSTEMS:  A comprehensive review of systems was negative.   PHYSICAL EXAMINATION: General appearance: alert, cooperative, and no distress Head: Normocephalic, without obvious abnormality, atraumatic Neck: no adenopathy, no JVD, supple, symmetrical, trachea midline, and thyroid not enlarged, symmetric, no  tenderness/mass/nodules Lymph nodes: Cervical, supraclavicular, and axillary nodes normal. Resp: clear to auscultation bilaterally Back: symmetric, no curvature. ROM normal. No CVA tenderness. Cardio: regular rate and rhythm, S1, S2 normal, no murmur, click, rub or gallop GI: soft, non-tender; bowel sounds normal; no masses,  no organomegaly Extremities: extremities normal, atraumatic, no cyanosis or edema  ECOG PERFORMANCE STATUS: 0 - Asymptomatic  Blood pressure 129/69, pulse 90, temperature (!) 96.6 F (35.9 C), temperature source Tympanic, resp. rate 18, weight 196 lb 6 oz (89.1 kg), SpO2 100 %.  LABORATORY DATA: Lab Results  Component Value Date   WBC 7.4 05/22/2021   HGB 13.3 05/22/2021   HCT 39.2 05/22/2021   MCV 97.3 05/22/2021   PLT 162 05/22/2021      Chemistry      Component Value Date/Time   NA 140 05/11/2020 1440   NA 140 02/26/2017 1326   K 4.3 05/11/2020 1440   K 4.3 02/26/2017 1326   CL 106 05/11/2020 1440   CO2 28 05/11/2020 1440   CO2 26 02/26/2017 1326   BUN 17 05/11/2020 1440   BUN 15.7 02/26/2017 1326   CREATININE 1.01 05/11/2020 1440   CREATININE 0.9 02/26/2017 1326      Component Value Date/Time   CALCIUM 9.1 05/11/2020 1440   CALCIUM 9.2 02/26/2017 1326   ALKPHOS 65 05/11/2020 1440   ALKPHOS 74 02/26/2017 1326   AST 27 05/11/2020 1440   AST 20 02/26/2017 1326   ALT 22 05/11/2020 1440   ALT 15 02/26/2017 1326   BILITOT 0.6 05/11/2020 1440   BILITOT 0.47 02/26/2017 1326       RADIOGRAPHIC STUDIES: No results found.   ASSESSMENT AND PLAN: This is a very pleasant 85 years old white male with stage II large B-cell non-Hodgkin lymphoma diagnosed in August 2017. He is status post 6 cycles of systemic chemotherapy with CHOP/Rituxan. He tolerated his treatment fairly well except for chemotherapy-induced pancytopenia and weakness.  The patient is currently on observation and doing fine with no concerning complaints. Repeat CBC today showed no  concerning abnormalities.  Comprehensive metabolic panel and LDH are still pending. I recommended for the patient to continue on observation with repeat blood work and CT scan of the chest, abdomen pelvis in 1 year. The patient was advised to call immediately if he has any concerning symptoms in the interval. The patient voices understanding of current disease status and treatment options and is in agreement with the current care plan. All questions were answered. The patient knows to call the clinic with any problems, questions or concerns. We can certainly see the patient much sooner if necessary.  Disclaimer: This note was dictated with voice recognition software. Similar sounding words can inadvertently be transcribed and may not be corrected upon review.

## 2021-05-29 DIAGNOSIS — E7800 Pure hypercholesterolemia, unspecified: Secondary | ICD-10-CM | POA: Diagnosis not present

## 2021-05-29 DIAGNOSIS — E039 Hypothyroidism, unspecified: Secondary | ICD-10-CM | POA: Diagnosis not present

## 2021-05-29 DIAGNOSIS — R899 Unspecified abnormal finding in specimens from other organs, systems and tissues: Secondary | ICD-10-CM | POA: Diagnosis not present

## 2021-05-31 DIAGNOSIS — Z23 Encounter for immunization: Secondary | ICD-10-CM | POA: Diagnosis not present

## 2021-06-02 DIAGNOSIS — M79651 Pain in right thigh: Secondary | ICD-10-CM | POA: Diagnosis not present

## 2021-06-02 DIAGNOSIS — Z0000 Encounter for general adult medical examination without abnormal findings: Secondary | ICD-10-CM | POA: Diagnosis not present

## 2021-06-02 DIAGNOSIS — E039 Hypothyroidism, unspecified: Secondary | ICD-10-CM | POA: Diagnosis not present

## 2021-06-02 DIAGNOSIS — E7800 Pure hypercholesterolemia, unspecified: Secondary | ICD-10-CM | POA: Diagnosis not present

## 2021-06-03 ENCOUNTER — Encounter: Payer: Self-pay | Admitting: Internal Medicine

## 2021-06-05 ENCOUNTER — Telehealth: Payer: Self-pay | Admitting: Internal Medicine

## 2021-06-05 NOTE — Telephone Encounter (Signed)
Rescheduled per 10/10 sch msg, pt has been called and confirmed appt

## 2021-06-19 DIAGNOSIS — M79604 Pain in right leg: Secondary | ICD-10-CM | POA: Diagnosis not present

## 2021-06-20 ENCOUNTER — Encounter: Payer: Self-pay | Admitting: Internal Medicine

## 2021-06-27 ENCOUNTER — Telehealth: Payer: Self-pay | Admitting: Internal Medicine

## 2021-06-27 NOTE — Telephone Encounter (Signed)
R/s per 11/1 pt request, pt has confirmed all new appts

## 2021-06-28 ENCOUNTER — Ambulatory Visit
Admission: RE | Admit: 2021-06-28 | Discharge: 2021-06-28 | Disposition: A | Discharge status: Home / Self Care | Payer: Medicare Other | Source: Ambulatory Visit | Attending: Sports Medicine | Admitting: Sports Medicine

## 2021-06-28 ENCOUNTER — Encounter: Payer: Self-pay | Admitting: Internal Medicine

## 2021-06-28 ENCOUNTER — Other Ambulatory Visit: Payer: Self-pay | Admitting: Sports Medicine

## 2021-06-28 DIAGNOSIS — M79604 Pain in right leg: Secondary | ICD-10-CM

## 2021-06-28 DIAGNOSIS — M545 Low back pain, unspecified: Secondary | ICD-10-CM

## 2021-06-28 DIAGNOSIS — M25551 Pain in right hip: Secondary | ICD-10-CM

## 2021-07-12 ENCOUNTER — Other Ambulatory Visit: Payer: Self-pay

## 2021-07-12 ENCOUNTER — Inpatient Hospital Stay: Payer: Medicare Other | Attending: Internal Medicine

## 2021-07-12 ENCOUNTER — Other Ambulatory Visit: Payer: Self-pay | Admitting: Internal Medicine

## 2021-07-12 DIAGNOSIS — Z452 Encounter for adjustment and management of vascular access device: Secondary | ICD-10-CM | POA: Diagnosis not present

## 2021-07-12 DIAGNOSIS — C8510 Unspecified B-cell lymphoma, unspecified site: Secondary | ICD-10-CM | POA: Diagnosis not present

## 2021-07-12 DIAGNOSIS — C8338 Diffuse large B-cell lymphoma, lymph nodes of multiple sites: Secondary | ICD-10-CM

## 2021-07-12 DIAGNOSIS — Z95828 Presence of other vascular implants and grafts: Secondary | ICD-10-CM

## 2021-07-12 MED ORDER — HEPARIN SOD (PORK) LOCK FLUSH 100 UNIT/ML IV SOLN
500.0000 [IU] | Freq: Once | INTRAVENOUS | Status: AC | PRN
  Administered 2021-07-12: 500 [IU] via INTRAVENOUS

## 2021-07-12 MED ORDER — SODIUM CHLORIDE 0.9% FLUSH
10.0000 mL | INTRAVENOUS | Status: DC | PRN
  Administered 2021-07-12: 10 mL via INTRAVENOUS

## 2021-07-13 ENCOUNTER — Other Ambulatory Visit: Payer: Self-pay | Admitting: Internal Medicine

## 2021-07-13 ENCOUNTER — Encounter: Payer: Self-pay | Admitting: Internal Medicine

## 2021-07-25 ENCOUNTER — Other Ambulatory Visit (HOSPITAL_BASED_OUTPATIENT_CLINIC_OR_DEPARTMENT_OTHER): Payer: Self-pay

## 2021-07-25 ENCOUNTER — Ambulatory Visit: Payer: Medicare Other | Attending: Internal Medicine

## 2021-07-25 DIAGNOSIS — Z23 Encounter for immunization: Secondary | ICD-10-CM

## 2021-07-25 MED ORDER — PFIZER COVID-19 VAC BIVALENT 30 MCG/0.3ML IM SUSP
INTRAMUSCULAR | 0 refills | Status: DC
  Filled 2021-07-25: qty 0.3, 1d supply, fill #0

## 2021-07-25 NOTE — Progress Notes (Signed)
   Covid-19 Vaccination Clinic  Name:  DAULTON HARBAUGH    MRN: [id number] DOB: October 02, 1935  07/25/2021  Mr. Sitar was observed post Covid-19 immunization for 15 minutes without incident. He was provided with Vaccine Information Sheet and instruction to access the V-Safe system.   Mr. Lunt was instructed to call 911 with any severe reactions post vaccine: Difficulty breathing  Swelling of face and throat  A fast heartbeat  A bad rash all over body  Dizziness and weakness   Immunizations Administered     Name Date Dose VIS Date Route   Pfizer Covid-19 Vaccine Bivalent Booster 07/25/2021  5:04 PM 0.3 mL 04/26/2021 Intramuscular   Manufacturer: [place]   Lot: [id number]   [place]: [phone]

## 2021-07-26 ENCOUNTER — Encounter: Payer: Self-pay | Admitting: Internal Medicine

## 2021-07-26 ENCOUNTER — Other Ambulatory Visit (HOSPITAL_BASED_OUTPATIENT_CLINIC_OR_DEPARTMENT_OTHER): Payer: Self-pay

## 2021-08-09 DIAGNOSIS — M25551 Pain in right hip: Secondary | ICD-10-CM | POA: Diagnosis not present

## 2021-08-24 ENCOUNTER — Other Ambulatory Visit: Payer: Self-pay | Admitting: Dentistry

## 2021-08-24 ENCOUNTER — Other Ambulatory Visit: Payer: Self-pay | Admitting: Sports Medicine

## 2021-08-24 DIAGNOSIS — M25551 Pain in right hip: Secondary | ICD-10-CM

## 2021-09-01 DIAGNOSIS — R899 Unspecified abnormal finding in specimens from other organs, systems and tissues: Secondary | ICD-10-CM | POA: Diagnosis not present

## 2021-09-01 DIAGNOSIS — E039 Hypothyroidism, unspecified: Secondary | ICD-10-CM | POA: Diagnosis not present

## 2021-09-06 ENCOUNTER — Inpatient Hospital Stay: Payer: Medicare Other | Attending: Internal Medicine

## 2021-09-06 ENCOUNTER — Other Ambulatory Visit: Payer: Self-pay

## 2021-09-06 DIAGNOSIS — C8338 Diffuse large B-cell lymphoma, lymph nodes of multiple sites: Secondary | ICD-10-CM | POA: Diagnosis not present

## 2021-09-06 DIAGNOSIS — Z95828 Presence of other vascular implants and grafts: Secondary | ICD-10-CM

## 2021-09-06 MED ORDER — SODIUM CHLORIDE 0.9% FLUSH
10.0000 mL | INTRAVENOUS | Status: DC | PRN
  Administered 2021-09-06: 10 mL via INTRAVENOUS

## 2021-09-06 MED ORDER — HEPARIN SOD (PORK) LOCK FLUSH 100 UNIT/ML IV SOLN
500.0000 [IU] | Freq: Once | INTRAVENOUS | Status: AC | PRN
  Administered 2021-09-06: 500 [IU] via INTRAVENOUS

## 2021-09-07 ENCOUNTER — Other Ambulatory Visit: Payer: Self-pay | Admitting: Internal Medicine

## 2021-09-08 ENCOUNTER — Encounter: Payer: Self-pay | Admitting: Internal Medicine

## 2021-09-09 ENCOUNTER — Other Ambulatory Visit: Payer: Self-pay

## 2021-09-09 ENCOUNTER — Ambulatory Visit
Admission: RE | Admit: 2021-09-09 | Discharge: 2021-09-09 | Disposition: A | Discharge status: Home / Self Care | Payer: Medicare Other | Source: Ambulatory Visit | Attending: Sports Medicine | Admitting: Sports Medicine

## 2021-09-09 DIAGNOSIS — M25551 Pain in right hip: Secondary | ICD-10-CM

## 2021-09-20 ENCOUNTER — Telehealth: Payer: Self-pay

## 2021-09-20 NOTE — Telephone Encounter (Signed)
Patient notified of prior authorization approval for Lidocaine-Prilocaine 2.5% (EMLA) Cream. Medication is approved through 09/19/2022. No other needs voiced at this time.

## 2021-11-01 ENCOUNTER — Inpatient Hospital Stay: Payer: Medicare Other | Attending: Internal Medicine

## 2021-11-01 ENCOUNTER — Other Ambulatory Visit: Payer: Self-pay

## 2021-11-01 DIAGNOSIS — C8338 Diffuse large B-cell lymphoma, lymph nodes of multiple sites: Secondary | ICD-10-CM | POA: Insufficient documentation

## 2021-11-01 DIAGNOSIS — Z95828 Presence of other vascular implants and grafts: Secondary | ICD-10-CM

## 2021-11-01 MED ORDER — HEPARIN SOD (PORK) LOCK FLUSH 100 UNIT/ML IV SOLN
500.0000 [IU] | Freq: Once | INTRAVENOUS | Status: AC | PRN
  Administered 2021-11-01: 500 [IU] via INTRAVENOUS

## 2021-11-01 MED ORDER — SODIUM CHLORIDE 0.9% FLUSH
10.0000 mL | INTRAVENOUS | Status: DC | PRN
  Administered 2021-11-01: 10 mL via INTRAVENOUS

## 2021-12-06 ENCOUNTER — Inpatient Hospital Stay: Payer: Medicare Other | Attending: Internal Medicine

## 2021-12-06 ENCOUNTER — Other Ambulatory Visit: Payer: Self-pay

## 2021-12-06 DIAGNOSIS — Z452 Encounter for adjustment and management of vascular access device: Secondary | ICD-10-CM | POA: Insufficient documentation

## 2021-12-06 DIAGNOSIS — Z95828 Presence of other vascular implants and grafts: Secondary | ICD-10-CM

## 2021-12-06 DIAGNOSIS — C8338 Diffuse large B-cell lymphoma, lymph nodes of multiple sites: Secondary | ICD-10-CM | POA: Diagnosis not present

## 2021-12-06 MED ORDER — HEPARIN SOD (PORK) LOCK FLUSH 100 UNIT/ML IV SOLN
500.0000 [IU] | Freq: Once | INTRAVENOUS | Status: AC | PRN
  Administered 2021-12-06: 500 [IU] via INTRAVENOUS

## 2021-12-06 MED ORDER — SODIUM CHLORIDE 0.9% FLUSH
10.0000 mL | INTRAVENOUS | Status: DC | PRN
  Administered 2021-12-06: 10 mL via INTRAVENOUS

## 2021-12-19 DIAGNOSIS — M79671 Pain in right foot: Secondary | ICD-10-CM | POA: Diagnosis not present

## 2021-12-19 DIAGNOSIS — M1611 Unilateral primary osteoarthritis, right hip: Secondary | ICD-10-CM | POA: Diagnosis not present

## 2021-12-19 DIAGNOSIS — M79604 Pain in right leg: Secondary | ICD-10-CM | POA: Diagnosis not present

## 2022-01-24 ENCOUNTER — Inpatient Hospital Stay: Payer: Medicare Other | Attending: Internal Medicine

## 2022-01-24 ENCOUNTER — Other Ambulatory Visit: Payer: Self-pay

## 2022-01-24 DIAGNOSIS — D61810 Antineoplastic chemotherapy induced pancytopenia: Secondary | ICD-10-CM | POA: Insufficient documentation

## 2022-01-24 DIAGNOSIS — C8338 Diffuse large B-cell lymphoma, lymph nodes of multiple sites: Secondary | ICD-10-CM

## 2022-01-24 DIAGNOSIS — C8330 Diffuse large B-cell lymphoma, unspecified site: Secondary | ICD-10-CM | POA: Insufficient documentation

## 2022-01-24 DIAGNOSIS — Z95828 Presence of other vascular implants and grafts: Secondary | ICD-10-CM

## 2022-01-24 MED ORDER — SODIUM CHLORIDE 0.9% FLUSH
10.0000 mL | INTRAVENOUS | Status: DC | PRN
  Administered 2022-01-24: 10 mL via INTRAVENOUS

## 2022-01-24 MED ORDER — HEPARIN SOD (PORK) LOCK FLUSH 100 UNIT/ML IV SOLN
500.0000 [IU] | Freq: Once | INTRAVENOUS | Status: AC | PRN
  Administered 2022-01-24: 500 [IU] via INTRAVENOUS

## 2022-03-14 ENCOUNTER — Other Ambulatory Visit: Payer: Self-pay

## 2022-03-14 ENCOUNTER — Inpatient Hospital Stay: Payer: Medicare Other | Attending: Internal Medicine

## 2022-03-14 DIAGNOSIS — C8338 Diffuse large B-cell lymphoma, lymph nodes of multiple sites: Secondary | ICD-10-CM

## 2022-03-14 DIAGNOSIS — Z452 Encounter for adjustment and management of vascular access device: Secondary | ICD-10-CM | POA: Diagnosis not present

## 2022-03-14 DIAGNOSIS — Z95828 Presence of other vascular implants and grafts: Secondary | ICD-10-CM

## 2022-03-14 DIAGNOSIS — C8330 Diffuse large B-cell lymphoma, unspecified site: Secondary | ICD-10-CM | POA: Diagnosis not present

## 2022-03-14 MED ORDER — HEPARIN SOD (PORK) LOCK FLUSH 100 UNIT/ML IV SOLN
500.0000 [IU] | Freq: Once | INTRAVENOUS | Status: AC | PRN
  Administered 2022-03-14: 500 [IU] via INTRAVENOUS

## 2022-03-14 MED ORDER — SODIUM CHLORIDE 0.9% FLUSH
10.0000 mL | INTRAVENOUS | Status: DC | PRN
  Administered 2022-03-14: 10 mL via INTRAVENOUS

## 2022-04-09 DIAGNOSIS — H524 Presbyopia: Secondary | ICD-10-CM | POA: Diagnosis not present

## 2022-05-09 ENCOUNTER — Other Ambulatory Visit: Payer: Self-pay

## 2022-05-09 ENCOUNTER — Inpatient Hospital Stay: Payer: Medicare Other | Attending: Internal Medicine

## 2022-05-09 DIAGNOSIS — Z95828 Presence of other vascular implants and grafts: Secondary | ICD-10-CM

## 2022-05-09 DIAGNOSIS — D61810 Antineoplastic chemotherapy induced pancytopenia: Secondary | ICD-10-CM | POA: Diagnosis not present

## 2022-05-09 DIAGNOSIS — C8338 Diffuse large B-cell lymphoma, lymph nodes of multiple sites: Secondary | ICD-10-CM

## 2022-05-09 DIAGNOSIS — C8330 Diffuse large B-cell lymphoma, unspecified site: Secondary | ICD-10-CM | POA: Diagnosis present

## 2022-05-09 MED ORDER — SODIUM CHLORIDE 0.9% FLUSH
10.0000 mL | INTRAVENOUS | Status: DC | PRN
  Administered 2022-05-09: 10 mL via INTRAVENOUS

## 2022-05-09 MED ORDER — HEPARIN SOD (PORK) LOCK FLUSH 100 UNIT/ML IV SOLN
500.0000 [IU] | Freq: Once | INTRAVENOUS | Status: AC | PRN
  Administered 2022-05-09: 500 [IU] via INTRAVENOUS

## 2022-05-09 NOTE — Patient Instructions (Signed)

## 2022-05-14 ENCOUNTER — Inpatient Hospital Stay: Payer: Medicare Other

## 2022-05-14 ENCOUNTER — Other Ambulatory Visit: Payer: Medicare Other

## 2022-05-14 ENCOUNTER — Ambulatory Visit (HOSPITAL_COMMUNITY)
Admission: RE | Admit: 2022-05-14 | Discharge: 2022-05-14 | Disposition: A | Discharge status: Home / Self Care | Payer: Medicare Other | Source: Ambulatory Visit | Attending: Internal Medicine | Admitting: Internal Medicine

## 2022-05-14 DIAGNOSIS — C8338 Diffuse large B-cell lymphoma, lymph nodes of multiple sites: Secondary | ICD-10-CM | POA: Diagnosis not present

## 2022-05-14 DIAGNOSIS — K5730 Diverticulosis of large intestine without perforation or abscess without bleeding: Secondary | ICD-10-CM | POA: Diagnosis not present

## 2022-05-14 DIAGNOSIS — R918 Other nonspecific abnormal finding of lung field: Secondary | ICD-10-CM | POA: Diagnosis not present

## 2022-05-14 MED ORDER — SODIUM CHLORIDE (PF) 0.9 % IJ SOLN
INTRAMUSCULAR | Status: AC
  Filled 2022-05-14: qty 50

## 2022-05-14 MED ORDER — IOHEXOL 300 MG/ML  SOLN
100.0000 mL | Freq: Once | INTRAMUSCULAR | Status: AC | PRN
  Administered 2022-05-14: 100 mL via INTRAVENOUS

## 2022-05-15 ENCOUNTER — Inpatient Hospital Stay: Payer: Medicare Other

## 2022-05-15 ENCOUNTER — Other Ambulatory Visit: Payer: Self-pay

## 2022-05-15 DIAGNOSIS — C8330 Diffuse large B-cell lymphoma, unspecified site: Secondary | ICD-10-CM | POA: Diagnosis not present

## 2022-05-15 DIAGNOSIS — C8338 Diffuse large B-cell lymphoma, lymph nodes of multiple sites: Secondary | ICD-10-CM

## 2022-05-15 LAB — CBC WITH DIFFERENTIAL (CANCER CENTER ONLY)
Abs Immature Granulocytes: 0.02 10*3/uL (ref 0.00–0.07)
Basophils Absolute: 0 10*3/uL (ref 0.0–0.1)
Basophils Relative: 0 %
Eosinophils Absolute: 0.1 10*3/uL (ref 0.0–0.5)
Eosinophils Relative: 1 %
HCT: 39.9 % (ref 39.0–52.0)
Hemoglobin: 13.6 g/dL (ref 13.0–17.0)
Immature Granulocytes: 0 %
Lymphocytes Relative: 20 %
Lymphs Abs: 1.5 10*3/uL (ref 0.7–4.0)
MCH: 33.7 pg (ref 26.0–34.0)
MCHC: 34.1 g/dL (ref 30.0–36.0)
MCV: 98.8 fL (ref 80.0–100.0)
Monocytes Absolute: 0.7 10*3/uL (ref 0.1–1.0)
Monocytes Relative: 9 %
Neutro Abs: 5.1 10*3/uL (ref 1.7–7.7)
Neutrophils Relative %: 70 %
Platelet Count: 156 10*3/uL (ref 150–400)
RBC: 4.04 MIL/uL — ABNORMAL LOW (ref 4.22–5.81)
RDW: 13.4 % (ref 11.5–15.5)
WBC Count: 7.3 10*3/uL (ref 4.0–10.5)
nRBC: 0 % (ref 0.0–0.2)

## 2022-05-15 LAB — CMP (CANCER CENTER ONLY)
ALT: 26 U/L (ref 0–44)
AST: 32 U/L (ref 15–41)
Albumin: 4.1 g/dL (ref 3.5–5.0)
Alkaline Phosphatase: 62 U/L (ref 38–126)
Anion gap: 4 — ABNORMAL LOW (ref 5–15)
BUN: 18 mg/dL (ref 8–23)
CO2: 31 mmol/L (ref 22–32)
Calcium: 9.3 mg/dL (ref 8.9–10.3)
Chloride: 105 mmol/L (ref 98–111)
Creatinine: 1.14 mg/dL (ref 0.61–1.24)
GFR, Estimated: >60 mL/min (ref >60)
Glucose, Bld: 109 mg/dL — ABNORMAL HIGH (ref 70–99)
Potassium: 4.1 mmol/L (ref 3.5–5.1)
Sodium: 140 mmol/L (ref 135–145)
Total Bilirubin: 0.8 mg/dL (ref 0.3–1.2)
Total Protein: 6.6 g/dL (ref 6.5–8.1)

## 2022-05-15 LAB — LACTATE DEHYDROGENASE: LDH: 173 U/L (ref 98–192)

## 2022-05-16 ENCOUNTER — Telehealth: Payer: Self-pay | Admitting: Physician Assistant

## 2022-05-16 ENCOUNTER — Telehealth: Payer: Self-pay | Admitting: Medical Oncology

## 2022-05-16 DIAGNOSIS — L719 Rosacea, unspecified: Secondary | ICD-10-CM | POA: Diagnosis not present

## 2022-05-16 NOTE — Telephone Encounter (Signed)
Received a telephone message about a call report from his recent scan. I called the patient to see if he has having any signs or symptoms of diverticulitis. Unable to reach him. Left him a voicemail asking for a call back.

## 2022-05-16 NOTE — Telephone Encounter (Signed)
Called report from [facility] Radiology-concern for Impression #3.  "IMPRESSION: 1. No adenopathy above or below the diaphragm and no splenomegaly. 2. Colonic diverticulosis with possible mild sigmoid colonic diverticulitis. Clinical correlation suggested. 3. Small hiatal hernia. 4. Moderate volume of formed stool throughout the colon may reflect constipation. 5.  Aortic Atherosclerosis (ICD10-I70.0)."

## 2022-05-18 NOTE — Telephone Encounter (Signed)
Pt has called back and denied all of the aforementioned sx.

## 2022-05-18 NOTE — Telephone Encounter (Signed)
I have called pt again. His wife advised he was unavailable. I requested she please have him call us back.

## 2022-05-22 ENCOUNTER — Encounter: Payer: Self-pay | Admitting: Internal Medicine

## 2022-05-22 ENCOUNTER — Inpatient Hospital Stay (HOSPITAL_BASED_OUTPATIENT_CLINIC_OR_DEPARTMENT_OTHER): Payer: Medicare Other | Admitting: Internal Medicine

## 2022-05-22 VITALS — BP 141/72 | HR 99 | Temp 97.9°F | Resp 16 | Wt 193.4 lb

## 2022-05-22 DIAGNOSIS — C8338 Diffuse large B-cell lymphoma, lymph nodes of multiple sites: Secondary | ICD-10-CM | POA: Diagnosis not present

## 2022-05-22 DIAGNOSIS — D61810 Antineoplastic chemotherapy induced pancytopenia: Secondary | ICD-10-CM | POA: Diagnosis not present

## 2022-05-22 DIAGNOSIS — C8330 Diffuse large B-cell lymphoma, unspecified site: Secondary | ICD-10-CM | POA: Diagnosis not present

## 2022-05-22 NOTE — Progress Notes (Signed)
Seth Gilbert [phone]   Fax:(336) [phone]  OFFICE PROGRESS NOTE  Lawerance Cruel, MD [place] [address]  DIAGNOSIS: Stage II large B-cell non-Hodgkin lymphoma diagnosed in August 2017.  PRIOR THERAPY: Systemic chemotherapy with CHOP/Rituxan every 3 weeks with Neulasta support, status post 6 cycles.  Last dose was given on 08/14/2016.  CURRENT THERAPY: Observation..  INTERVAL HISTORY: Seth Gilbert 86 y.o. male returns to the clinic today for annual follow-up visit.  The patient is feeling fine today with no concerning complaints.  He denied having any chest pain, shortness of breath, cough or hemoptysis.  He denied having any nausea, vomiting, diarrhea or constipation.  He has no headache or visual changes.  He denied having any significant weight loss or night sweats.  He is here today for evaluation with repeat blood work as well as CT scan of the chest, abdomen and pelvis for restaging of his disease.  MEDICAL HISTORY: Past Medical History:  Diagnosis Date   Diverticulitis    GERD (gastroesophageal reflux disease)    Heartburn at times   Heart murmur    "comes and goes" onset 4th grade - last time anyone heard it was 01/2014   History of hiatal hernia    History of kidney stones    HOH (hard of hearing)    Non-Hodgkin lymphoma ([facility])    Peripheral neuropathy 09/11/2016   PONV (postoperative nausea and vomiting)    Rosacea    Wears glasses     ALLERGIES:  is allergic to no known allergies.  MEDICATIONS:  Current Outpatient Medications  Medication Sig Dispense Refill   ampicillin (PRINCIPEN) 500 MG capsule Take 1 capsule by mouth at bedtime.     COVID-19 mRNA bivalent vaccine, Pfizer, (PFIZER COVID-19 VAC BIVALENT) injection Inject into the muscle. 0.3 mL 0   COVID-19 mRNA Vac-TriS, Pfizer, SUSP injection Inject into the muscle. 0.3 mL 0   famotidine (PEPCID) 40 MG tablet Take 40 mg by mouth daily as needed for  heartburn.      Glucosamine HCl (GLUCOSAMINE PO) Take 1,800 mg by mouth 3 (three) times daily.     levothyroxine (SYNTHROID, LEVOTHROID) 50 MCG tablet Take 1 tablet by mouth daily.  2   lidocaine-prilocaine (EMLA) cream APPLY TOPICALLY AS NEEDED. 30 g 2   metroNIDAZOLE (METROGEL) 0.75 % gel Apply 1 application topically daily.      Multiple Vitamins-Minerals (CENTRUM SILVER PO) Take 1 tablet by mouth daily.      rosuvastatin (CRESTOR) 20 MG tablet Take 20 mg by mouth daily.     senna (SENOKOT) 8.6 MG tablet Take 1 tablet by mouth daily as needed for constipation.     vitamin E 400 UNIT capsule Take 400 Units by mouth daily.     No current facility-administered medications for this visit.    SURGICAL HISTORY:  Past Surgical History:  Procedure Laterality Date   APPENDECTOMY     2-3 yrs old   COLONOSCOPY     HEMORROIDECTOMY     inflammed anal gland removed   INGUINAL HERNIA REPAIR Left 02/11/2014   Procedure: LEFT INGUINAL HERNIA REPAIR WITH MESH ;  Surgeon: Joyice Faster. Cornett, MD;  Location: [facility];  Service: General;  Laterality: Left;   INSERTION OF MESH N/A 02/11/2014   Procedure: INSERTION OF MESH;  Surgeon: Joyice Faster. Cornett, MD;  Location: [facility];  Service: General;  Laterality: N/A;   PORTACATH PLACEMENT Left  04/10/2016   Procedure: INSERTION PORT-A-CATH into LEFT Subclavian Vein with guidance from ultrasound and flouroscopy;  Surgeon: Grace Isaac, MD;  Location: [facility];  Service: Thoracic;  Laterality: Left;   TONSILLECTOMY     VIDEO BRONCHOSCOPY WITH ENDOBRONCHIAL ULTRASOUND N/A 03/30/2016   Procedure: VIDEO BRONCHOSCOPY WITH ENDOBRONCHIAL Gerrit Heck TRANSBRONCHIAL BIOPSY;  Surgeon: Grace Isaac, MD;  Location: [facility];  Service: Thoracic;  Laterality: N/A;    REVIEW OF SYSTEMS:  A comprehensive review of systems was negative.   PHYSICAL EXAMINATION: General appearance: alert, cooperative, and no distress Head: Normocephalic,  without obvious abnormality, atraumatic Neck: no adenopathy, no JVD, supple, symmetrical, trachea midline, and thyroid not enlarged, symmetric, no tenderness/mass/nodules Lymph nodes: Cervical, supraclavicular, and axillary nodes normal. Resp: clear to auscultation bilaterally Back: symmetric, no curvature. ROM normal. No CVA tenderness. Cardio: regular rate and rhythm, S1, S2 normal, no murmur, click, rub or gallop GI: soft, non-tender; bowel sounds normal; no masses,  no organomegaly Extremities: extremities normal, atraumatic, no cyanosis or edema  ECOG PERFORMANCE STATUS: 0 - Asymptomatic  Blood pressure (!) 141/72, pulse 99, temperature 97.9 F (36.6 C), temperature source Oral, resp. rate 16, weight 193 lb 6.4 oz (87.7 kg), SpO2 97 %.  LABORATORY DATA: Lab Results  Component Value Date   WBC 7.3 05/15/2022   HGB 13.6 05/15/2022   HCT 39.9 05/15/2022   MCV 98.8 05/15/2022   PLT 156 05/15/2022      Chemistry      Component Value Date/Time   NA 140 05/15/2022 1414   NA 140 02/26/2017 1326   K 4.1 05/15/2022 1414   K 4.3 02/26/2017 1326   CL 105 05/15/2022 1414   CO2 31 05/15/2022 1414   CO2 26 02/26/2017 1326   BUN 18 05/15/2022 1414   BUN 15.7 02/26/2017 1326   CREATININE 1.14 05/15/2022 1414   CREATININE 0.9 02/26/2017 1326      Component Value Date/Time   CALCIUM 9.3 05/15/2022 1414   CALCIUM 9.2 02/26/2017 1326   ALKPHOS 62 05/15/2022 1414   ALKPHOS 74 02/26/2017 1326   AST 32 05/15/2022 1414   AST 20 02/26/2017 1326   ALT 26 05/15/2022 1414   ALT 15 02/26/2017 1326   BILITOT 0.8 05/15/2022 1414   BILITOT 0.47 02/26/2017 1326       RADIOGRAPHIC STUDIES: CT Chest W Contrast  Result Date: 05/15/2022 CLINICAL DATA:  History of diffuse large B-cell lymphoma 2 years surveillance. * Tracking Code: BO * EXAM: CT CHEST, ABDOMEN, AND PELVIS WITH CONTRAST TECHNIQUE: Multidetector CT imaging of the chest, abdomen and pelvis was performed following the standard  protocol during bolus administration of intravenous contrast. RADIATION DOSE REDUCTION: This exam was performed according to the departmental dose-optimization program which includes automated exposure control, adjustment of the mA and/or kV according to patient size and/or use of iterative reconstruction technique. CONTRAST:  142m OMNIPAQUE IOHEXOL 300 MG/ML  SOLN COMPARISON:  Multiple priors including most recent CT May 12, 2020. FINDINGS: CT CHEST FINDINGS Cardiovascular: Left chest Port-A-Cath with tip at the superior cavoatrial junction. Aortic atherosclerosis. No central pulmonary embolus on this nondedicated study. Normal size heart. Trace pericardial effusion is similar prior within physiologic normal limits Mediastinum/Nodes: No suspicious thyroid nodules. No pathologically enlarged mediastinal, hilar or axillary lymph nodes. Small hiatal hernia. Lungs/Pleura: Subpleural reticulations. Stable nodular scarring in the lingula on image 103/4. No suspicious pulmonary nodules or masses. No focal airspace consolidation Musculoskeletal: Thoracic spondylosis. No suspicious osseous lesion. CT ABDOMEN PELVIS FINDINGS Hepatobiliary: No  suspicious hepatic lesion. Gallbladder is unremarkable. No biliary ductal dilation. Pancreas: No pancreatic ductal dilation or evidence of acute inflammation. Spleen: No splenomegaly or focal splenic lesion. Adrenals/Urinary Tract: Bilateral adrenal glands appear normal. No hydronephrosis. Kidneys demonstrate symmetric enhancement and excretion of contrast material. Urinary bladder is unremarkable for degree of distension. Stomach/Bowel: Radiopaque enteric contrast material traverses the ascending colon. Small hiatal hernia. No pathologic dilation of small or large bowel. No evidence of acute bowel inflammation. Left-sided colonic diverticulosis possible mild acute sigmoid colonic diverticulitis. Vascular/Lymphatic: Atherosclerotic calcifications of the aorta. No pathologically  enlarged abdominal or pelvic lymph nodes. Reproductive: Dystrophic calcifications in the prostate gland. Other: No significant abdominopelvic free fluid. Musculoskeletal: No aggressive lytic or blastic lesion of bone. Multilevel degenerative changes spine. Degenerative change of the hips and SI joints with partial bony ankylosis of the SI joints. IMPRESSION: 1. No adenopathy above or below the diaphragm and no splenomegaly. 2. Colonic diverticulosis with possible mild sigmoid colonic diverticulitis. Clinical correlation suggested. 3. Small hiatal hernia. 4. Moderate volume of formed stool throughout the colon may reflect constipation. 5.  Aortic Atherosclerosis (ICD10-I70.0). These results will be called to the ordering clinician or representative by the Radiologist Assistant, and communication documented in the PACS or [employer]. Electronically Signed   By: Dahlia Bailiff M.D.   On: 05/15/2022 15:56   CT Abdomen Pelvis W Contrast  Result Date: 05/15/2022 CLINICAL DATA:  History of diffuse large B-cell lymphoma 2 years surveillance. * Tracking Code: BO * EXAM: CT CHEST, ABDOMEN, AND PELVIS WITH CONTRAST TECHNIQUE: Multidetector CT imaging of the chest, abdomen and pelvis was performed following the standard protocol during bolus administration of intravenous contrast. RADIATION DOSE REDUCTION: This exam was performed according to the departmental dose-optimization program which includes automated exposure control, adjustment of the mA and/or kV according to patient size and/or use of iterative reconstruction technique. CONTRAST:  175m OMNIPAQUE IOHEXOL 300 MG/ML  SOLN COMPARISON:  Multiple priors including most recent CT May 12, 2020. FINDINGS: CT CHEST FINDINGS Cardiovascular: Left chest Port-A-Cath with tip at the superior cavoatrial junction. Aortic atherosclerosis. No central pulmonary embolus on this nondedicated study. Normal size heart. Trace pericardial effusion is similar prior within  physiologic normal limits Mediastinum/Nodes: No suspicious thyroid nodules. No pathologically enlarged mediastinal, hilar or axillary lymph nodes. Small hiatal hernia. Lungs/Pleura: Subpleural reticulations. Stable nodular scarring in the lingula on image 103/4. No suspicious pulmonary nodules or masses. No focal airspace consolidation Musculoskeletal: Thoracic spondylosis. No suspicious osseous lesion. CT ABDOMEN PELVIS FINDINGS Hepatobiliary: No suspicious hepatic lesion. Gallbladder is unremarkable. No biliary ductal dilation. Pancreas: No pancreatic ductal dilation or evidence of acute inflammation. Spleen: No splenomegaly or focal splenic lesion. Adrenals/Urinary Tract: Bilateral adrenal glands appear normal. No hydronephrosis. Kidneys demonstrate symmetric enhancement and excretion of contrast material. Urinary bladder is unremarkable for degree of distension. Stomach/Bowel: Radiopaque enteric contrast material traverses the ascending colon. Small hiatal hernia. No pathologic dilation of small or large bowel. No evidence of acute bowel inflammation. Left-sided colonic diverticulosis possible mild acute sigmoid colonic diverticulitis. Vascular/Lymphatic: Atherosclerotic calcifications of the aorta. No pathologically enlarged abdominal or pelvic lymph nodes. Reproductive: Dystrophic calcifications in the prostate gland. Other: No significant abdominopelvic free fluid. Musculoskeletal: No aggressive lytic or blastic lesion of bone. Multilevel degenerative changes spine. Degenerative change of the hips and SI joints with partial bony ankylosis of the SI joints. IMPRESSION: 1. No adenopathy above or below the diaphragm and no splenomegaly. 2. Colonic diverticulosis with possible mild sigmoid colonic diverticulitis. Clinical correlation suggested. 3.  Small hiatal hernia. 4. Moderate volume of formed stool throughout the colon may reflect constipation. 5.  Aortic Atherosclerosis (ICD10-I70.0). These results will be  called to the ordering clinician or representative by the Radiologist Assistant, and communication documented in the PACS or [employer]. Electronically Signed   By: Dahlia Bailiff M.D.   On: 05/15/2022 15:56     ASSESSMENT AND PLAN: This is a very pleasant 86 years old white male with stage II large B-cell non-Hodgkin lymphoma diagnosed in August 2017. He is status post 6 cycles of systemic chemotherapy with CHOP/Rituxan. He tolerated his treatment fairly well except for chemotherapy-induced pancytopenia and weakness.  The patient has been in observation for the last 6 years and he is doing fine with no concerning complaints. He had repeat CT scan of the chest, abdomen and pelvis performed recently.  I personally and independently reviewed the scans and discussed the result with the patient today. His scan showed no concerning findings for disease recurrence. It showed possible mild sigmoid colonic diverticulitis but the patient is currently asymptomatic. I recommended for the patient to continue on observation with repeat blood work in 1 year. For the Port-A-Cath, I will refer the patient to IR for consideration of removal of the Port-A-Cath. The patient was advised to call immediately if he has any other concerning symptoms in the interval. The patient voices understanding of current disease status and treatment options and is in agreement with the current care plan. All questions were answered. The patient knows to call the clinic with any problems, questions or concerns. We can certainly see the patient much sooner if necessary.  Disclaimer: This note was dictated with voice recognition software. Similar sounding words can inadvertently be transcribed and may not be corrected upon review.

## 2022-05-23 ENCOUNTER — Encounter: Payer: Self-pay | Admitting: Internal Medicine

## 2022-06-01 DIAGNOSIS — Z23 Encounter for immunization: Secondary | ICD-10-CM | POA: Diagnosis not present

## 2022-06-11 DIAGNOSIS — E7800 Pure hypercholesterolemia, unspecified: Secondary | ICD-10-CM | POA: Diagnosis not present

## 2022-06-11 DIAGNOSIS — E039 Hypothyroidism, unspecified: Secondary | ICD-10-CM | POA: Diagnosis not present

## 2022-06-13 DIAGNOSIS — E039 Hypothyroidism, unspecified: Secondary | ICD-10-CM | POA: Diagnosis not present

## 2022-06-13 DIAGNOSIS — Z0000 Encounter for general adult medical examination without abnormal findings: Secondary | ICD-10-CM | POA: Diagnosis not present

## 2022-06-13 DIAGNOSIS — I700 Atherosclerosis of aorta: Secondary | ICD-10-CM | POA: Diagnosis not present

## 2022-06-13 DIAGNOSIS — E7800 Pure hypercholesterolemia, unspecified: Secondary | ICD-10-CM | POA: Diagnosis not present

## 2022-06-18 ENCOUNTER — Other Ambulatory Visit (HOSPITAL_BASED_OUTPATIENT_CLINIC_OR_DEPARTMENT_OTHER): Payer: Self-pay

## 2022-06-18 ENCOUNTER — Encounter: Payer: Self-pay | Admitting: Internal Medicine

## 2022-06-18 MED ORDER — COMIRNATY 30 MCG/0.3ML IM SUSY
PREFILLED_SYRINGE | INTRAMUSCULAR | 0 refills | Status: AC
  Filled 2022-06-18: qty 0.3, 1d supply, fill #0

## 2022-06-27 ENCOUNTER — Inpatient Hospital Stay: Payer: Medicare Other | Attending: Internal Medicine

## 2022-06-27 DIAGNOSIS — Z95828 Presence of other vascular implants and grafts: Secondary | ICD-10-CM

## 2022-06-27 DIAGNOSIS — Z452 Encounter for adjustment and management of vascular access device: Secondary | ICD-10-CM | POA: Diagnosis not present

## 2022-06-27 DIAGNOSIS — C8338 Diffuse large B-cell lymphoma, lymph nodes of multiple sites: Secondary | ICD-10-CM

## 2022-06-27 DIAGNOSIS — C8330 Diffuse large B-cell lymphoma, unspecified site: Secondary | ICD-10-CM | POA: Diagnosis not present

## 2022-06-27 MED ORDER — SODIUM CHLORIDE 0.9% FLUSH
10.0000 mL | INTRAVENOUS | Status: DC | PRN
  Administered 2022-06-27: 10 mL via INTRAVENOUS

## 2022-06-27 MED ORDER — HEPARIN SOD (PORK) LOCK FLUSH 100 UNIT/ML IV SOLN
500.0000 [IU] | Freq: Once | INTRAVENOUS | Status: AC | PRN
  Administered 2022-06-27: 500 [IU] via INTRAVENOUS

## 2022-06-28 ENCOUNTER — Encounter: Payer: Self-pay | Admitting: Medical Oncology

## 2022-06-28 ENCOUNTER — Encounter: Payer: Self-pay | Admitting: Internal Medicine

## 2022-07-12 ENCOUNTER — Other Ambulatory Visit (HOSPITAL_COMMUNITY): Payer: Medicare Other

## 2022-08-06 ENCOUNTER — Other Ambulatory Visit (HOSPITAL_COMMUNITY): Payer: Medicare Other

## 2022-08-15 ENCOUNTER — Inpatient Hospital Stay: Payer: Medicare Other | Attending: Internal Medicine

## 2022-08-15 ENCOUNTER — Telehealth (HOSPITAL_COMMUNITY): Payer: Self-pay

## 2022-08-15 ENCOUNTER — Telehealth: Payer: Self-pay | Admitting: Medical Oncology

## 2022-08-15 NOTE — Telephone Encounter (Signed)
Ok per Dr. Denna Haggard for port removal since the pt's surgeon has retired. AW

## 2022-08-15 NOTE — Telephone Encounter (Signed)
Dr Murrell Redden a-l Abd in IR said he can take  out pt's port.Marland Kitchen  LVM for pt to return my call to see if he still wants port removed.

## 2022-08-27 ENCOUNTER — Encounter: Payer: Self-pay | Admitting: Internal Medicine

## 2023-04-23 ENCOUNTER — Encounter: Payer: Self-pay | Admitting: Internal Medicine

## 2023-05-16 ENCOUNTER — Encounter: Payer: Self-pay | Admitting: Internal Medicine

## 2023-05-23 ENCOUNTER — Inpatient Hospital Stay: Payer: Medicare Other | Admitting: Internal Medicine

## 2023-05-23 ENCOUNTER — Inpatient Hospital Stay: Payer: Medicare Other

## 2023-05-23 ENCOUNTER — Telehealth: Payer: Self-pay | Admitting: Medical Oncology

## 2023-05-23 NOTE — Telephone Encounter (Signed)
Pt cancelled appts today due to wife hospitalized. He said he will call back to r/s appts.

## 2023-07-08 ENCOUNTER — Telehealth: Payer: Self-pay | Admitting: *Deleted

## 2023-07-08 NOTE — Telephone Encounter (Signed)
Patient called to report that he had cancelled his one year follow up in September because his wife was hospitalized and passed away. He'd like to make an appt to have his port flushed, have labs and see Dr. Arbutus Ped. Schedule message sent

## 2023-08-02 ENCOUNTER — Telehealth: Payer: Self-pay | Admitting: Internal Medicine

## 2023-08-02 NOTE — Telephone Encounter (Signed)
Called patient in regards to rescheduled appointment due to provider being out of office, patient stated they will not come in the morning and also did not want the next available, patient was advised of Dr. Kerry Fort schedule and patient is aware of scheduled appointment times/dates

## 2023-08-06 ENCOUNTER — Other Ambulatory Visit: Payer: Self-pay | Admitting: Internal Medicine

## 2023-08-07 ENCOUNTER — Other Ambulatory Visit: Payer: Self-pay

## 2023-08-07 ENCOUNTER — Encounter: Payer: Self-pay | Admitting: Internal Medicine

## 2023-08-07 DIAGNOSIS — C8338 Diffuse large B-cell lymphoma, lymph nodes of multiple sites: Secondary | ICD-10-CM

## 2023-08-08 ENCOUNTER — Other Ambulatory Visit: Payer: Medicare Other

## 2023-08-08 ENCOUNTER — Ambulatory Visit: Payer: Medicare Other | Admitting: Internal Medicine

## 2023-08-08 ENCOUNTER — Inpatient Hospital Stay: Payer: Medicare Other | Attending: Internal Medicine

## 2023-08-08 DIAGNOSIS — Z95828 Presence of other vascular implants and grafts: Secondary | ICD-10-CM

## 2023-08-08 DIAGNOSIS — C8338 Diffuse large B-cell lymphoma, lymph nodes of multiple sites: Secondary | ICD-10-CM

## 2023-08-08 DIAGNOSIS — Z8572 Personal history of non-Hodgkin lymphomas: Secondary | ICD-10-CM | POA: Insufficient documentation

## 2023-08-08 LAB — CBC WITH DIFFERENTIAL (CANCER CENTER ONLY)
Abs Immature Granulocytes: 0.03 10*3/uL (ref 0.00–0.07)
Basophils Absolute: 0 10*3/uL (ref 0.0–0.1)
Basophils Relative: 1 %
Eosinophils Absolute: 0.1 10*3/uL (ref 0.0–0.5)
Eosinophils Relative: 1 %
HCT: 39.4 % (ref 39.0–52.0)
Hemoglobin: 13.7 g/dL (ref 13.0–17.0)
Immature Granulocytes: 0 %
Lymphocytes Relative: 22 %
Lymphs Abs: 1.8 10*3/uL (ref 0.7–4.0)
MCH: 34.3 pg — ABNORMAL HIGH (ref 26.0–34.0)
MCHC: 34.8 g/dL (ref 30.0–36.0)
MCV: 98.5 fL (ref 80.0–100.0)
Monocytes Absolute: 0.9 10*3/uL (ref 0.1–1.0)
Monocytes Relative: 10 %
Neutro Abs: 5.5 10*3/uL (ref 1.7–7.7)
Neutrophils Relative %: 66 %
Platelet Count: 153 10*3/uL (ref 150–400)
RBC: 4 MIL/uL — ABNORMAL LOW (ref 4.22–5.81)
RDW: 13.3 % (ref 11.5–15.5)
WBC Count: 8.3 10*3/uL (ref 4.0–10.5)
nRBC: 0 % (ref 0.0–0.2)

## 2023-08-08 LAB — CMP (CANCER CENTER ONLY)
ALT: 18 U/L (ref 0–44)
AST: 22 U/L (ref 15–41)
Albumin: 4.1 g/dL (ref 3.5–5.0)
Alkaline Phosphatase: 58 U/L (ref 38–126)
Anion gap: 6 (ref 5–15)
BUN: 20 mg/dL (ref 8–23)
CO2: 29 mmol/L (ref 22–32)
Calcium: 9.4 mg/dL (ref 8.9–10.3)
Chloride: 104 mmol/L (ref 98–111)
Creatinine: 0.96 mg/dL (ref 0.61–1.24)
GFR, Estimated: >60 mL/min (ref >60)
Glucose, Bld: 137 mg/dL — ABNORMAL HIGH (ref 70–99)
Potassium: 4.3 mmol/L (ref 3.5–5.1)
Sodium: 139 mmol/L (ref 135–145)
Total Bilirubin: 0.6 mg/dL (ref <1.2)
Total Protein: 6.7 g/dL (ref 6.5–8.1)

## 2023-08-08 MED ORDER — HEPARIN SOD (PORK) LOCK FLUSH 100 UNIT/ML IV SOLN
500.0000 [IU] | Freq: Once | INTRAVENOUS | Status: AC | PRN
  Administered 2023-08-08: 500 [IU] via INTRAVENOUS

## 2023-08-08 MED ORDER — SODIUM CHLORIDE 0.9% FLUSH
10.0000 mL | INTRAVENOUS | Status: DC | PRN
  Administered 2023-08-08: 10 mL via INTRAVENOUS

## 2023-08-31 ENCOUNTER — Encounter: Payer: Self-pay | Admitting: Internal Medicine

## 2023-09-02 ENCOUNTER — Other Ambulatory Visit: Payer: Medicare Other

## 2023-09-03 ENCOUNTER — Other Ambulatory Visit: Payer: Self-pay

## 2023-09-03 DIAGNOSIS — C8338 Diffuse large B-cell lymphoma, lymph nodes of multiple sites: Secondary | ICD-10-CM

## 2023-09-04 ENCOUNTER — Inpatient Hospital Stay: Payer: Medicare Other | Attending: Internal Medicine | Admitting: Internal Medicine

## 2023-09-04 ENCOUNTER — Inpatient Hospital Stay: Payer: Medicare Other

## 2023-09-04 VITALS — BP 138/74 | HR 89 | Temp 98.3°F | Resp 17 | Ht 68.0 in | Wt 189.7 lb

## 2023-09-04 DIAGNOSIS — C8338 Diffuse large B-cell lymphoma, lymph nodes of multiple sites: Secondary | ICD-10-CM

## 2023-09-04 DIAGNOSIS — C44721 Squamous cell carcinoma of skin of unspecified lower limb, including hip: Secondary | ICD-10-CM | POA: Diagnosis not present

## 2023-09-04 DIAGNOSIS — Z95828 Presence of other vascular implants and grafts: Secondary | ICD-10-CM

## 2023-09-04 DIAGNOSIS — Z8572 Personal history of non-Hodgkin lymphomas: Secondary | ICD-10-CM | POA: Diagnosis present

## 2023-09-04 DIAGNOSIS — Z9221 Personal history of antineoplastic chemotherapy: Secondary | ICD-10-CM | POA: Diagnosis not present

## 2023-09-04 DIAGNOSIS — R739 Hyperglycemia, unspecified: Secondary | ICD-10-CM | POA: Insufficient documentation

## 2023-09-04 LAB — CBC WITH DIFFERENTIAL (CANCER CENTER ONLY)
Abs Immature Granulocytes: 0.01 10*3/uL (ref 0.00–0.07)
Basophils Absolute: 0 10*3/uL (ref 0.0–0.1)
Basophils Relative: 1 %
Eosinophils Absolute: 0.2 10*3/uL (ref 0.0–0.5)
Eosinophils Relative: 3 %
HCT: 39 % (ref 39.0–52.0)
Hemoglobin: 13.3 g/dL (ref 13.0–17.0)
Immature Granulocytes: 0 %
Lymphocytes Relative: 23 %
Lymphs Abs: 1.5 10*3/uL (ref 0.7–4.0)
MCH: 33.5 pg (ref 26.0–34.0)
MCHC: 34.1 g/dL (ref 30.0–36.0)
MCV: 98.2 fL (ref 80.0–100.0)
Monocytes Absolute: 0.8 10*3/uL (ref 0.1–1.0)
Monocytes Relative: 12 %
Neutro Abs: 4.1 10*3/uL (ref 1.7–7.7)
Neutrophils Relative %: 61 %
Platelet Count: 161 10*3/uL (ref 150–400)
RBC: 3.97 MIL/uL — ABNORMAL LOW (ref 4.22–5.81)
RDW: 13.2 % (ref 11.5–15.5)
WBC Count: 6.6 10*3/uL (ref 4.0–10.5)
nRBC: 0 % (ref 0.0–0.2)

## 2023-09-04 LAB — CMP (CANCER CENTER ONLY)
ALT: 20 U/L (ref 0–44)
AST: 27 U/L (ref 15–41)
Albumin: 3.9 g/dL (ref 3.5–5.0)
Alkaline Phosphatase: 62 U/L (ref 38–126)
Anion gap: 6 (ref 5–15)
BUN: 21 mg/dL (ref 8–23)
CO2: 27 mmol/L (ref 22–32)
Calcium: 9 mg/dL (ref 8.9–10.3)
Chloride: 107 mmol/L (ref 98–111)
Creatinine: 1.02 mg/dL (ref 0.61–1.24)
GFR, Estimated: >60 mL/min (ref >60)
Glucose, Bld: 113 mg/dL — ABNORMAL HIGH (ref 70–99)
Potassium: 4.2 mmol/L (ref 3.5–5.1)
Sodium: 140 mmol/L (ref 135–145)
Total Bilirubin: 0.6 mg/dL (ref 0.0–1.2)
Total Protein: 6.5 g/dL (ref 6.5–8.1)

## 2023-09-04 MED ORDER — SODIUM CHLORIDE 0.9% FLUSH
10.0000 mL | INTRAVENOUS | Status: DC | PRN
  Administered 2023-09-04: 10 mL via INTRAVENOUS

## 2023-09-04 MED ORDER — HEPARIN SOD (PORK) LOCK FLUSH 100 UNIT/ML IV SOLN
500.0000 [IU] | Freq: Once | INTRAVENOUS | Status: AC | PRN
  Administered 2023-09-04: 500 [IU] via INTRAVENOUS

## 2023-09-04 NOTE — Progress Notes (Signed)
 [facility] Telephone:(336) [phone]   Fax:(336) [phone]  OFFICE PROGRESS NOTE  Okey Carlin Redbird, MD [address] [place] [address]  DIAGNOSIS: Stage II large B-cell non-Hodgkin lymphoma diagnosed in August 2017.  PRIOR THERAPY: Systemic chemotherapy with CHOP/Rituxan  every 3 weeks with Neulasta  support, status post 6 cycles.  Last dose was given on 08/14/2016.  CURRENT THERAPY: Observation..  INTERVAL HISTORY: Seth Gilbert 88 y.o. male returns to the clinic today for follow-up visit.Discussed the use of AI scribe software for clinical note transcription with the patient, who gave verbal consent to proceed.  History of Present Illness   The patient, with a history of non-Hodgkin's lymphoma, has been on observation since his last dose of CHOP and Rituxan  in December 2017. He reports no new symptoms related to his lymphoma over the past 15 months. However, he has developed a new skin lesion on his leg. A biopsy was performed by his dermatologist, revealing squamous cell carcinoma keratoacanthoma type. The patient is under the impression that the lesion has the potential to progress quickly to skin cancer, but it is currently being managed by his dermatologist.  In addition to his lymphoma and new skin lesion, the patient has experienced significant personal stress over the past 18 months due to the illness and subsequent passing of his spouse. This has resulted in a lapse in his personal care and exercise regimen, which he plans to resume. He reports no weight loss, night sweats, fatigue, or weakness.       MEDICAL HISTORY: Past Medical History:  Diagnosis Date   Diverticulitis    GERD (gastroesophageal reflux disease)    Heartburn at times   Heart murmur    comes and goes onset 4th grade - last time anyone heard it was 01/2014   History of hiatal hernia    History of kidney stones    HOH (hard of hearing)    Non-Hodgkin lymphoma ([facility])     Peripheral neuropathy 09/11/2016   PONV (postoperative nausea and vomiting)    Rosacea    Wears glasses     ALLERGIES:  is allergic to no known allergies.  MEDICATIONS:  Current Outpatient Medications  Medication Sig Dispense Refill   ampicillin (PRINCIPEN) 500 MG capsule Take 1 capsule by mouth at bedtime. I take it with food     COVID-19 mRNA vaccine 2023-2024 (COMIRNATY ) syringe Inject into the muscle. 0.3 mL 0   famotidine (PEPCID) 40 MG tablet Take 40 mg by mouth daily as needed for heartburn.      Glucosamine HCl (GLUCOSAMINE PO) Take 1,800 mg by mouth 3 (three) times daily.     levothyroxine  (SYNTHROID , LEVOTHROID) 50 MCG tablet Take 1 tablet by mouth daily.  2   lidocaine -prilocaine  (EMLA ) cream APPLY TOPICALLY AS NEEDED 30 g 2   metroNIDAZOLE  (METROGEL ) 0.75 % gel Apply 1 application topically daily.      Multiple Vitamins-Minerals (CENTRUM SILVER PO) Take 1 tablet by mouth daily.      rosuvastatin  (CRESTOR ) 20 MG tablet Take 20 mg by mouth daily.     senna (SENOKOT) 8.6 MG tablet Take 1 tablet by mouth daily as needed for constipation.     vitamin E  400 UNIT capsule Take 400 Units by mouth daily.     No current facility-administered medications for this visit.    SURGICAL HISTORY:  Past Surgical History:  Procedure Laterality Date   APPENDECTOMY     2-3 yrs old   COLONOSCOPY  HEMORROIDECTOMY     inflammed anal gland removed   INGUINAL HERNIA REPAIR Left 02/11/2014   Procedure: LEFT INGUINAL HERNIA REPAIR WITH MESH ;  Surgeon: Debby LABOR. Cornett, MD;  Location: [facility];  Service: General;  Laterality: Left;   INSERTION OF MESH N/A 02/11/2014   Procedure: INSERTION OF MESH;  Surgeon: Debby LABOR. Cornett, MD;  Location: [facility];  Service: General;  Laterality: N/A;   PORTACATH PLACEMENT Left 04/10/2016   Procedure: INSERTION PORT-A-CATH into LEFT Subclavian Vein with guidance from ultrasound and flouroscopy;  Surgeon: Dallas KATHEE Jude,  MD;  Location: [facility] OR;  Service: Thoracic;  Laterality: Left;   TONSILLECTOMY     VIDEO BRONCHOSCOPY WITH ENDOBRONCHIAL ULTRASOUND N/A 03/30/2016   Procedure: VIDEO BRONCHOSCOPY WITH ENDOBRONCHIAL ULTRASOUND,WITH TRANSBRONCHIAL BIOPSY;  Surgeon: Dallas KATHEE Jude, MD;  Location: [facility];  Service: Thoracic;  Laterality: N/A;    REVIEW OF SYSTEMS:  A comprehensive review of systems was negative.   PHYSICAL EXAMINATION: General appearance: alert, cooperative, and no distress Head: Normocephalic, without obvious abnormality, atraumatic Neck: no adenopathy, no JVD, supple, symmetrical, trachea midline, and thyroid  not enlarged, symmetric, no tenderness/mass/nodules Lymph nodes: Cervical, supraclavicular, and axillary nodes normal. Resp: clear to auscultation bilaterally Back: symmetric, no curvature. ROM normal. No CVA tenderness. Cardio: regular rate and rhythm, S1, S2 normal, no murmur, click, rub or gallop GI: soft, non-tender; bowel sounds normal; no masses,  no organomegaly Extremities: extremities normal, atraumatic, no cyanosis or edema  ECOG PERFORMANCE STATUS: 0 - Asymptomatic  Blood pressure 138/74, pulse 89, temperature 98.3 F (36.8 C), temperature source Temporal, resp. rate 17, height 5' 8 (1.727 m), weight 189 lb 11.2 oz (86 kg), SpO2 100%.  LABORATORY DATA: Lab Results  Component Value Date   WBC 6.6 09/04/2023   HGB 13.3 09/04/2023   HCT 39.0 09/04/2023   MCV 98.2 09/04/2023   PLT 161 09/04/2023      Chemistry      Component Value Date/Time   NA 140 09/04/2023 0931   NA 140 02/26/2017 1326   K 4.2 09/04/2023 0931   K 4.3 02/26/2017 1326   CL 107 09/04/2023 0931   CO2 27 09/04/2023 0931   CO2 26 02/26/2017 1326   BUN 21 09/04/2023 0931   BUN 15.7 02/26/2017 1326   CREATININE 1.02 09/04/2023 0931   CREATININE 0.9 02/26/2017 1326      Component Value Date/Time   CALCIUM  9.0 09/04/2023 0931   CALCIUM  9.2 02/26/2017 1326   ALKPHOS 62 09/04/2023 0931   ALKPHOS  74 02/26/2017 1326   AST 27 09/04/2023 0931   AST 20 02/26/2017 1326   ALT 20 09/04/2023 0931   ALT 15 02/26/2017 1326   BILITOT 0.6 09/04/2023 0931   BILITOT 0.47 02/26/2017 1326       RADIOGRAPHIC STUDIES: No results found.   ASSESSMENT AND PLAN: This is a very pleasant 88 years old white male with stage II large B-cell non-Hodgkin lymphoma diagnosed in August 2017. He is status post 6 cycles of systemic chemotherapy with CHOP/Rituxan . He tolerated his treatment fairly well except for chemotherapy-induced pancytopenia and weakness.  The patient has been in observation for the last 7 years and he is doing fine with no concerning complaints.    Non-Hodgkin's Lymphoma Non-Hodgkin's Lymphoma treated with CHOP and Rituxan , last dose December 2017. On observation since then with no new symptoms (weight loss, night sweats, fatigue). Blood work: normal WBC, hemoglobin, hematocrit, platelets. Chemistry panel: slightly elevated blood glucose. Patient  prefers continued observation. - Repeat blood work in one year - Order scan in one year to ensure no recurrence  Squamous Cell Carcinoma Biopsy-confirmed squamous cell carcinoma, keratoacanthoma type, on the leg. Dermatologist managing with planned excision in February. Patient concerned about timing but agreed to plan. - No immediate action required from hematology/oncology - Monitor for new skin lesions or changes  General Health Maintenance Patient has not maintained exercise regimen due to caregiving responsibilities and recent bereavement. Plans to resume exercise soon. Blood glucose slightly elevated, likely due to non-fasting state during test. - Encourage resumption of regular exercise - Monitor fasting blood glucose, aiming for <99 mg/dL  Follow-up - Schedule follow-up appointment in one year - Perform lab work one week before the appointment.   He was advised to call immediately if he has any concerning symptoms in the  interval. The patient voices understanding of current disease status and treatment options and is in agreement with the current care plan. All questions were answered. The patient knows to call the clinic with any problems, questions or concerns. We can certainly see the patient much sooner if necessary.  Disclaimer: This note was dictated with voice recognition software. Similar sounding words can inadvertently be transcribed and may not be corrected upon review.

## 2023-09-23 ENCOUNTER — Ambulatory Visit: Payer: Medicare Other | Admitting: Internal Medicine

## 2023-09-23 ENCOUNTER — Other Ambulatory Visit: Payer: Medicare Other

## 2023-10-29 ENCOUNTER — Other Ambulatory Visit: Payer: Self-pay

## 2023-10-29 DIAGNOSIS — Z95828 Presence of other vascular implants and grafts: Secondary | ICD-10-CM

## 2023-10-30 ENCOUNTER — Inpatient Hospital Stay: Payer: Medicare Other | Attending: Internal Medicine

## 2023-10-30 DIAGNOSIS — Z8572 Personal history of non-Hodgkin lymphomas: Secondary | ICD-10-CM | POA: Diagnosis present

## 2023-10-30 DIAGNOSIS — Z95828 Presence of other vascular implants and grafts: Secondary | ICD-10-CM

## 2023-10-30 DIAGNOSIS — C8338 Diffuse large B-cell lymphoma, lymph nodes of multiple sites: Secondary | ICD-10-CM

## 2023-10-30 MED ORDER — SODIUM CHLORIDE 0.9% FLUSH
10.0000 mL | INTRAVENOUS | Status: DC | PRN
  Administered 2023-10-30: 10 mL via INTRAVENOUS

## 2023-10-30 MED ORDER — HEPARIN SOD (PORK) LOCK FLUSH 100 UNIT/ML IV SOLN
500.0000 [IU] | Freq: Once | INTRAVENOUS | Status: AC | PRN
  Administered 2023-10-30: 500 [IU] via INTRAVENOUS

## 2023-10-30 NOTE — Addendum Note (Signed)
 Addended by: Irena Cords T on: 10/30/2023 03:55 PM   Modules accepted: Orders

## 2023-12-25 ENCOUNTER — Encounter

## 2023-12-25 ENCOUNTER — Inpatient Hospital Stay: Payer: Medicare Other | Attending: Internal Medicine

## 2023-12-25 ENCOUNTER — Inpatient Hospital Stay: Attending: Internal Medicine

## 2023-12-25 DIAGNOSIS — Z8572 Personal history of non-Hodgkin lymphomas: Secondary | ICD-10-CM | POA: Diagnosis present

## 2023-12-25 DIAGNOSIS — Z95828 Presence of other vascular implants and grafts: Secondary | ICD-10-CM

## 2023-12-25 DIAGNOSIS — C8338 Diffuse large B-cell lymphoma, lymph nodes of multiple sites: Secondary | ICD-10-CM

## 2023-12-25 MED ORDER — SODIUM CHLORIDE 0.9% FLUSH
10.0000 mL | INTRAVENOUS | Status: DC | PRN
  Administered 2023-12-25: 10 mL via INTRAVENOUS

## 2023-12-25 MED ORDER — HEPARIN SOD (PORK) LOCK FLUSH 100 UNIT/ML IV SOLN
500.0000 [IU] | Freq: Once | INTRAVENOUS | Status: AC | PRN
  Administered 2023-12-25: 500 [IU] via INTRAVENOUS

## 2024-02-19 ENCOUNTER — Encounter

## 2024-02-19 ENCOUNTER — Inpatient Hospital Stay: Attending: Internal Medicine

## 2024-02-19 ENCOUNTER — Inpatient Hospital Stay: Payer: Medicare Other

## 2024-02-19 VITALS — BP 126/67 | HR 87 | Temp 98.1°F | Resp 18

## 2024-02-19 DIAGNOSIS — C8338 Diffuse large B-cell lymphoma, lymph nodes of multiple sites: Secondary | ICD-10-CM | POA: Insufficient documentation

## 2024-02-19 DIAGNOSIS — Z9221 Personal history of antineoplastic chemotherapy: Secondary | ICD-10-CM | POA: Diagnosis not present

## 2024-02-19 DIAGNOSIS — Z95828 Presence of other vascular implants and grafts: Secondary | ICD-10-CM

## 2024-02-19 MED ORDER — SODIUM CHLORIDE 0.9% FLUSH
10.0000 mL | INTRAVENOUS | Status: DC | PRN
Start: 2024-02-19 — End: 2024-02-19
  Administered 2024-02-19: 10 mL via INTRAVENOUS

## 2024-02-19 MED ORDER — HEPARIN SOD (PORK) LOCK FLUSH 100 UNIT/ML IV SOLN
500.0000 [IU] | Freq: Once | INTRAVENOUS | Status: AC | PRN
  Administered 2024-02-19: 500 [IU] via INTRAVENOUS

## 2024-04-15 ENCOUNTER — Inpatient Hospital Stay: Attending: Internal Medicine

## 2024-04-15 ENCOUNTER — Inpatient Hospital Stay: Payer: Medicare Other | Attending: Internal Medicine

## 2024-04-15 DIAGNOSIS — Z95828 Presence of other vascular implants and grafts: Secondary | ICD-10-CM

## 2024-04-15 DIAGNOSIS — C8338 Diffuse large B-cell lymphoma, lymph nodes of multiple sites: Secondary | ICD-10-CM

## 2024-04-15 DIAGNOSIS — Z8572 Personal history of non-Hodgkin lymphomas: Secondary | ICD-10-CM | POA: Insufficient documentation

## 2024-04-15 DIAGNOSIS — Z9221 Personal history of antineoplastic chemotherapy: Secondary | ICD-10-CM | POA: Insufficient documentation

## 2024-04-15 MED ORDER — SODIUM CHLORIDE 0.9% FLUSH
10.0000 mL | INTRAVENOUS | Status: DC | PRN
  Administered 2024-04-15: 10 mL via INTRAVENOUS

## 2024-06-10 ENCOUNTER — Inpatient Hospital Stay: Attending: Internal Medicine

## 2024-06-10 ENCOUNTER — Inpatient Hospital Stay: Payer: Medicare Other

## 2024-06-10 DIAGNOSIS — Z8572 Personal history of non-Hodgkin lymphomas: Secondary | ICD-10-CM | POA: Diagnosis present

## 2024-08-05 ENCOUNTER — Inpatient Hospital Stay: Attending: Internal Medicine

## 2024-08-05 ENCOUNTER — Inpatient Hospital Stay: Payer: Medicare Other | Attending: Internal Medicine

## 2024-08-05 DIAGNOSIS — Z8572 Personal history of non-Hodgkin lymphomas: Secondary | ICD-10-CM | POA: Insufficient documentation

## 2024-08-06 ENCOUNTER — Other Ambulatory Visit: Payer: Self-pay

## 2024-08-06 ENCOUNTER — Other Ambulatory Visit: Payer: Self-pay | Admitting: Internal Medicine

## 2024-08-06 DIAGNOSIS — C8338 Diffuse large B-cell lymphoma, lymph nodes of multiple sites: Secondary | ICD-10-CM

## 2024-08-25 ENCOUNTER — Other Ambulatory Visit: Payer: Self-pay | Admitting: Internal Medicine

## 2024-08-25 ENCOUNTER — Other Ambulatory Visit: Payer: Medicare Other

## 2024-08-28 ENCOUNTER — Other Ambulatory Visit: Payer: Self-pay

## 2024-08-28 MED ORDER — LIDOCAINE-PRILOCAINE 2.5-2.5 % EX CREA: TOPICAL_CREAM | CUTANEOUS | 2 refills | Status: AC

## 2024-08-31 ENCOUNTER — Inpatient Hospital Stay: Attending: Internal Medicine

## 2024-08-31 ENCOUNTER — Encounter: Payer: Self-pay | Admitting: Internal Medicine

## 2024-08-31 DIAGNOSIS — C833A Diffuse large b-cell lymphoma, in remission: Secondary | ICD-10-CM | POA: Insufficient documentation

## 2024-08-31 DIAGNOSIS — D61810 Antineoplastic chemotherapy induced pancytopenia: Secondary | ICD-10-CM | POA: Insufficient documentation

## 2024-08-31 DIAGNOSIS — Z8572 Personal history of non-Hodgkin lymphomas: Secondary | ICD-10-CM | POA: Diagnosis present

## 2024-08-31 DIAGNOSIS — C8338 Diffuse large B-cell lymphoma, lymph nodes of multiple sites: Secondary | ICD-10-CM

## 2024-08-31 DIAGNOSIS — I7090 Unspecified atherosclerosis: Secondary | ICD-10-CM | POA: Diagnosis not present

## 2024-08-31 DIAGNOSIS — Z9221 Personal history of antineoplastic chemotherapy: Secondary | ICD-10-CM | POA: Insufficient documentation

## 2024-08-31 DIAGNOSIS — I712 Thoracic aortic aneurysm, without rupture, unspecified: Secondary | ICD-10-CM | POA: Insufficient documentation

## 2024-08-31 LAB — CBC WITH DIFFERENTIAL (CANCER CENTER ONLY)
Abs Immature Granulocytes: 0.02 K/uL (ref 0.00–0.07)
Basophils Absolute: 0 K/uL (ref 0.0–0.1)
Basophils Relative: 1 %
Eosinophils Absolute: 0.1 K/uL (ref 0.0–0.5)
Eosinophils Relative: 1 %
HCT: 39.6 % (ref 39.0–52.0)
Hemoglobin: 13.5 g/dL (ref 13.0–17.0)
Immature Granulocytes: 0 %
Lymphocytes Relative: 24 %
Lymphs Abs: 1.9 K/uL (ref 0.7–4.0)
MCH: 32.8 pg (ref 26.0–34.0)
MCHC: 34.1 g/dL (ref 30.0–36.0)
MCV: 96.1 fL (ref 80.0–100.0)
Monocytes Absolute: 0.9 K/uL (ref 0.1–1.0)
Monocytes Relative: 12 %
Neutro Abs: 5 K/uL (ref 1.7–7.7)
Neutrophils Relative %: 62 %
Platelet Count: 164 K/uL (ref 150–400)
RBC: 4.12 MIL/uL — ABNORMAL LOW (ref 4.22–5.81)
RDW: 13.5 % (ref 11.5–15.5)
WBC Count: 7.9 K/uL (ref 4.0–10.5)
nRBC: 0 % (ref 0.0–0.2)

## 2024-08-31 LAB — CMP (CANCER CENTER ONLY)
ALT: 18 U/L (ref 0–44)
AST: 26 U/L (ref 15–41)
Albumin: 4.1 g/dL (ref 3.5–5.0)
Alkaline Phosphatase: 68 U/L (ref 38–126)
Anion gap: 10 (ref 5–15)
BUN: 19 mg/dL (ref 8–23)
CO2: 23 mmol/L (ref 22–32)
Calcium: 9.1 mg/dL (ref 8.9–10.3)
Chloride: 105 mmol/L (ref 98–111)
Creatinine: 1.15 mg/dL (ref 0.61–1.24)
GFR, Estimated: >60 mL/min
Glucose, Bld: 107 mg/dL — ABNORMAL HIGH (ref 70–99)
Potassium: 4.4 mmol/L (ref 3.5–5.1)
Sodium: 138 mmol/L (ref 135–145)
Total Bilirubin: 0.5 mg/dL (ref 0.0–1.2)
Total Protein: 6.6 g/dL (ref 6.5–8.1)

## 2024-08-31 LAB — LACTATE DEHYDROGENASE: LDH: 176 U/L (ref 105–235)

## 2024-09-01 ENCOUNTER — Ambulatory Visit: Payer: Medicare Other | Admitting: Internal Medicine

## 2024-09-02 ENCOUNTER — Ambulatory Visit: Admitting: Internal Medicine

## 2024-09-04 ENCOUNTER — Ambulatory Visit (HOSPITAL_COMMUNITY)
Admission: RE | Admit: 2024-09-04 | Discharge: 2024-09-04 | Disposition: A | Source: Ambulatory Visit | Attending: Internal Medicine | Admitting: Internal Medicine

## 2024-09-04 DIAGNOSIS — C8338 Diffuse large B-cell lymphoma, lymph nodes of multiple sites: Secondary | ICD-10-CM | POA: Insufficient documentation

## 2024-09-04 MED ORDER — IOHEXOL 9 MG/ML PO SOLN
500.0000 mL | ORAL | Status: AC
  Administered 2024-09-04 (×2): 500 mL via ORAL

## 2024-09-04 MED ORDER — IOHEXOL 300 MG/ML  SOLN
100.0000 mL | Freq: Once | INTRAMUSCULAR | Status: AC | PRN
  Administered 2024-09-04: 100 mL via INTRAVENOUS

## 2024-09-07 ENCOUNTER — Inpatient Hospital Stay: Admitting: Internal Medicine

## 2024-09-07 VITALS — BP 124/61 | HR 96 | Temp 97.6°F | Resp 17 | Ht 67.0 in | Wt 197.7 lb

## 2024-09-07 DIAGNOSIS — C8338 Diffuse large B-cell lymphoma, lymph nodes of multiple sites: Secondary | ICD-10-CM | POA: Diagnosis not present

## 2024-09-07 DIAGNOSIS — C833A Diffuse large b-cell lymphoma, in remission: Secondary | ICD-10-CM | POA: Diagnosis not present

## 2024-09-07 NOTE — Progress Notes (Signed)
 "     [facility] [phone]   [phone]  OFFICE PROGRESS NOTE  Okey Carlin Redbird, MD [address] [place] [address]  DIAGNOSIS: Stage II large B-cell non-Hodgkin lymphoma diagnosed in August 2017.  PRIOR THERAPY: Systemic chemotherapy with CHOP/Rituxan  every 3 weeks with Neulasta  support, status post 6 cycles.  Last dose was given on 08/14/2016.  CURRENT THERAPY: Observation.  INTERVAL HISTORY: Seth Gilbert 89 y.o. male returns to the clinic today for follow-up visit.Discussed the use of AI scribe software for clinical note transcription with the patient, who gave verbal consent to proceed.  History of Present Illness Seth Gilbert is an 89 year old male with stage II diffuse large B-cell non-Hodgkin lymphoma in remission who presents for routine surveillance and re-staging imaging.  He was diagnosed with stage II diffuse large B-cell non-Hodgkin lymphoma in August 2017 and completed six cycles of R-CHOP chemotherapy, with the final dose administered in December 2017.  He is currently asymptomatic, denying chest pain, dyspnea, nausea, vomiting, diarrhea, or unintentional weight loss. He notes a weight gain of approximately 2-3 pounds over the past year and denies any palpable lymphadenopathy or swelling.  Recent CT imaging identified a very mild thoracic aortic aneurysm and atherosclerosis. The patient did not recall seeing these findings on prior studies. He reports no symptoms such as chest pain or shortness of breath.  He continues to have a vascular access port in place from prior chemotherapy and is amenable to removal, preferring to wait until his daughter can accompany him.    MEDICAL HISTORY: Past Medical History:  Diagnosis Date   Diverticulitis    GERD (gastroesophageal reflux disease)    Heartburn at times   Heart murmur    comes and goes onset 4th grade - last time anyone heard it was 01/2014   History of  hiatal hernia    History of kidney stones    HOH (hard of hearing)    Non-Hodgkin lymphoma (HCC)    Peripheral neuropathy 09/11/2016   PONV (postoperative nausea and vomiting)    Rosacea    Wears glasses     ALLERGIES:  is allergic to no known allergies.  MEDICATIONS:  Current Outpatient Medications  Medication Sig Dispense Refill   ampicillin (PRINCIPEN) 500 MG capsule Take 1 capsule by mouth at bedtime. I take it with food     COVID-19 mRNA vaccine 2023-2024 (COMIRNATY ) syringe Inject into the muscle. 0.3 mL 0   famotidine (PEPCID) 40 MG tablet Take 40 mg by mouth daily as needed for heartburn.      Glucosamine HCl (GLUCOSAMINE PO) Take 1,800 mg by mouth 3 (three) times daily.     levothyroxine  (SYNTHROID ) 75 MCG tablet Take 75 mcg by mouth daily before breakfast.     lidocaine -prilocaine  (EMLA ) cream Apply topically as directed. Apply to port site 2 hours prior to infusion. 30 g 2   metroNIDAZOLE  (METROGEL ) 0.75 % gel Apply 1 application topically daily.      Multiple Vitamins-Minerals (CENTRUM SILVER PO) Take 1 tablet by mouth daily.      rosuvastatin  (CRESTOR ) 20 MG tablet Take 20 mg by mouth daily.     senna (SENOKOT) 8.6 MG tablet Take 1 tablet by mouth daily as needed for constipation.     vitamin E  400 UNIT capsule Take 400 Units by mouth daily.     No current facility-administered medications for this visit.    SURGICAL HISTORY:  Past Surgical History:  Procedure Laterality  Date   APPENDECTOMY     2-3 yrs old   COLONOSCOPY     HEMORROIDECTOMY     inflammed anal gland removed   INGUINAL HERNIA REPAIR Left 02/11/2014   Procedure: LEFT INGUINAL HERNIA REPAIR WITH MESH ;  Surgeon: Debby LABOR. Cornett, MD;  Location: [facility];  Service: General;  Laterality: Left;   INSERTION OF MESH N/A 02/11/2014   Procedure: INSERTION OF MESH;  Surgeon: Debby LABOR. Cornett, MD;  Location: [facility];  Service: General;  Laterality: N/A;   PORTACATH  PLACEMENT Left 04/10/2016   Procedure: INSERTION PORT-A-CATH into LEFT Subclavian Vein with guidance from ultrasound and flouroscopy;  Surgeon: Dallas KATHEE Jude, MD;  Location: [facility] OR;  Service: Thoracic;  Laterality: Left;   TONSILLECTOMY     VIDEO BRONCHOSCOPY WITH ENDOBRONCHIAL ULTRASOUND N/A 03/30/2016   Procedure: VIDEO BRONCHOSCOPY WITH ENDOBRONCHIAL ULTRASOUND,WITH TRANSBRONCHIAL BIOPSY;  Surgeon: Dallas KATHEE Jude, MD;  Location: [facility] OR;  Service: Thoracic;  Laterality: N/A;    REVIEW OF SYSTEMS:  A comprehensive review of systems was negative.   PHYSICAL EXAMINATION: General appearance: alert, cooperative, and no distress Head: Normocephalic, without obvious abnormality, atraumatic Neck: no adenopathy, no JVD, supple, symmetrical, trachea midline, and thyroid  not enlarged, symmetric, no tenderness/mass/nodules Lymph nodes: Cervical, supraclavicular, and axillary nodes normal. Resp: clear to auscultation bilaterally Back: symmetric, no curvature. ROM normal. No CVA tenderness. Cardio: regular rate and rhythm, S1, S2 normal, no murmur, click, rub or gallop GI: soft, non-tender; bowel sounds normal; no masses,  no organomegaly Extremities: extremities normal, atraumatic, no cyanosis or edema  ECOG PERFORMANCE STATUS: 0 - Asymptomatic  Blood pressure 124/61, pulse 96, temperature 97.6 F (36.4 C), temperature source Temporal, resp. rate 17, height 5' 7 (1.702 m), weight 197 lb 11.2 oz (89.7 kg), SpO2 99%.  LABORATORY DATA: Lab Results  Component Value Date   WBC 7.9 08/31/2024   HGB 13.5 08/31/2024   HCT 39.6 08/31/2024   MCV 96.1 08/31/2024   PLT 164 08/31/2024      Chemistry      Component Value Date/Time   NA 138 08/31/2024 1448   NA 140 02/26/2017 1326   K 4.4 08/31/2024 1448   K 4.3 02/26/2017 1326   CL 105 08/31/2024 1448   CO2 23 08/31/2024 1448   CO2 26 02/26/2017 1326   BUN 19 08/31/2024 1448   BUN 15.7 02/26/2017 1326   CREATININE 1.15 08/31/2024 1448    CREATININE 0.9 02/26/2017 1326      Component Value Date/Time   CALCIUM  9.1 08/31/2024 1448   CALCIUM  9.2 02/26/2017 1326   ALKPHOS 68 08/31/2024 1448   ALKPHOS 74 02/26/2017 1326   AST 26 08/31/2024 1448   AST 20 02/26/2017 1326   ALT 18 08/31/2024 1448   ALT 15 02/26/2017 1326   BILITOT 0.5 08/31/2024 1448   BILITOT 0.47 02/26/2017 1326       RADIOGRAPHIC STUDIES: CT CHEST ABDOMEN PELVIS W CONTRAST Result Date: 09/04/2024 EXAM: CT CHEST, ABDOMEN AND PELVIS WITH CONTRAST 09/04/2024 03:18:45 PM TECHNIQUE: CT of the chest, abdomen and pelvis was performed with the administration of 100 mL of iohexol  (OMNIPAQUE ) 300 MG/ML solution. Multiplanar reformatted images are provided for review. Automated exposure control, iterative reconstruction, and/or weight based adjustment of the mA/kV was utilized to reduce the radiation dose to as low as reasonably achievable. COMPARISON: 05/14/2022. CLINICAL HISTORY: Hematologic malignancy, staging. FINDINGS: CHEST: MEDIASTINUM AND LYMPH NODES: Left chest wall port with tip in upper SVC. Moderate 3  vessel coronary atherosclerosis. Heart and pericardium are unremarkable. The central airways are clear. No mediastinal, hilar or axillary lymphadenopathy. LUNGS AND PLEURA: Mild subpleural reticulation in the lungs bilaterally. No focal consolidation or pulmonary edema. No pleural effusion. No pneumothorax. ABDOMEN AND PELVIS: LIVER: Unremarkable. GALLBLADDER AND BILE DUCTS: Unremarkable. No biliary ductal dilatation. SPLEEN: The spleen is normal in size. PANCREAS: No acute abnormality. ADRENAL GLANDS: No acute abnormality. KIDNEYS, URETERS AND BLADDER: No stones in the kidneys or ureters. No hydronephrosis. No perinephric or periureteral stranding. Urinary bladder is unremarkable. GI AND BOWEL: Moderate hiatal hernia. Left colonic diverticulosis, without evidence of diverticulitis. Stomach demonstrates no acute abnormality. There is no bowel obstruction. REPRODUCTIVE  ORGANS: The prostate is notable for dystrophic calcifications. No acute abnormality. PERITONEUM AND RETROPERITONEUM: No ascites. No free air. VASCULATURE: Mild thoracic aortic aneurysm. Atherosclerotic calcifications of the abdominal aorta and branch vessels, although patent. ABDOMINAL AND PELVIS LYMPH NODES: No lymphadenopathy. BONES AND SOFT TISSUES: Mild gynecomastia. Postsurgical changes related to prior left inguinal hernia repair. Small right inguinal hernia. Degenerative changes of the visualized right lumbar spine. IMPRESSION: 1. No findings suspicious for recurrent/residual lymphoma. Electronically signed by: Pinkie Pebbles MD MD 09/04/2024 09:18 PM EST RP Workstation: [id number]     ASSESSMENT AND PLAN: This is a very pleasant 89 years old white male with stage II large B-cell non-Hodgkin lymphoma diagnosed in August 2017. He is status post 6 cycles of systemic chemotherapy with CHOP/Rituxan . He tolerated his treatment fairly well except for chemotherapy-induced pancytopenia and weakness.  The patient has been in observation for the last 8 years and he is doing fine with no concerning complaints. He had repeat labs and CT scan of the chest, abdomen and pelvis performed recently.  I personally independently reviewed the imaging studies and discussed the result with the patient today. Assessment and Plan Assessment & Plan Stage II diffuse large B-cell lymphoma, in remission He completed six cycles of R-CHOP in late 2017. He is asymptomatic, with no evidence of disease recurrence on recent CT imaging and laboratory studies. He maintains weight and reports no B symptoms or new complaints. - Scheduled follow-up in one year with blood work only; no imaging required.  Thoracic aortic aneurysm Very mild thoracic aortic aneurysm incidentally identified on recent imaging. He is asymptomatic and there are no current clinical concerns from an oncologic perspective.  Atherosclerosis Atherosclerosis  identified on imaging with patent vessels. He is asymptomatic and this is not contributing to any current oncologic issues.  Management of vascular access device (port) Port remains in place from prior chemotherapy. He is agreeable to continued maintenance flushes every two months until he arranges for removal with accompaniment, as he lives alone. Port removal and the need for accompaniment post-procedure were discussed. - Scheduled port flushes every two months. - Discussed port removal; he will notify when he is able to arrange for accompaniment post-procedure.  He was advised to call immediately if he has any concerning symptoms in the interval. The patient voices understanding of current disease status and treatment options and is in agreement with the current care plan. All questions were answered. The patient knows to call the clinic with any problems, questions or concerns. We can certainly see the patient much sooner if necessary.  Disclaimer: This note was dictated with voice recognition software. Similar sounding words can inadvertently be transcribed and may not be corrected upon review.       "

## 2024-09-30 ENCOUNTER — Encounter

## 2024-09-30 ENCOUNTER — Inpatient Hospital Stay: Payer: Medicare Other | Attending: Internal Medicine

## 2024-10-28 ENCOUNTER — Inpatient Hospital Stay

## 2024-12-02 ENCOUNTER — Inpatient Hospital Stay

## 2025-01-13 ENCOUNTER — Inpatient Hospital Stay

## 2025-02-10 ENCOUNTER — Inpatient Hospital Stay

## 2025-09-08 ENCOUNTER — Inpatient Hospital Stay: Admitting: Internal Medicine

## 2025-09-08 ENCOUNTER — Inpatient Hospital Stay
